# Patient Record
Sex: Male | Born: 1970 | Race: White | Hispanic: No | Smoking: Former smoker
Health system: Southern US, Community
[De-identification: ages and names within clinical notes are randomized; demographics above are authoritative.]

## PROBLEM LIST (undated history)

## (undated) DIAGNOSIS — E8989 Other postprocedural endocrine and metabolic complications and disorders: Secondary | ICD-10-CM

## (undated) DIAGNOSIS — Z0000 Encounter for general adult medical examination without abnormal findings: Secondary | ICD-10-CM

## (undated) DIAGNOSIS — M549 Dorsalgia, unspecified: Secondary | ICD-10-CM

## (undated) DIAGNOSIS — I10 Essential (primary) hypertension: Secondary | ICD-10-CM

## (undated) DIAGNOSIS — F411 Generalized anxiety disorder: Secondary | ICD-10-CM

## (undated) DIAGNOSIS — C439 Malignant melanoma of skin, unspecified: Secondary | ICD-10-CM

## (undated) DIAGNOSIS — B1920 Unspecified viral hepatitis C without hepatic coma: Secondary | ICD-10-CM

## (undated) DIAGNOSIS — G629 Polyneuropathy, unspecified: Secondary | ICD-10-CM

## (undated) DIAGNOSIS — G894 Chronic pain syndrome: Secondary | ICD-10-CM

## (undated) DIAGNOSIS — I890 Lymphedema, not elsewhere classified: Secondary | ICD-10-CM

## (undated) DIAGNOSIS — F988 Other specified behavioral and emotional disorders with onset usually occurring in childhood and adolescence: Principal | ICD-10-CM

## (undated) DIAGNOSIS — F329 Major depressive disorder, single episode, unspecified: Secondary | ICD-10-CM

## (undated) DIAGNOSIS — G4700 Insomnia, unspecified: Secondary | ICD-10-CM

## (undated) DIAGNOSIS — F528 Other sexual dysfunction not due to a substance or known physiological condition: Secondary | ICD-10-CM

## (undated) HISTORY — DX: Chronic pain syndrome: G89.4

## (undated) HISTORY — PX: SHOULDER SURGERY: SHX246

## (undated) HISTORY — PX: LYMPHADENECTOMY: SHX15

## (undated) HISTORY — DX: Essential (primary) hypertension: I10

## (undated) HISTORY — DX: Dorsalgia, unspecified: M54.9

## (undated) HISTORY — DX: Other specified behavioral and emotional disorders with onset usually occurring in childhood and adolescence: F98.8

## (undated) HISTORY — DX: Insomnia, unspecified: G47.00

## (undated) HISTORY — DX: Generalized anxiety disorder: F41.1

## (undated) HISTORY — DX: Lymphedema, not elsewhere classified: I89.0

## (undated) HISTORY — DX: Other postprocedural endocrine and metabolic complications and disorders: E89.89

## (undated) HISTORY — DX: Other sexual dysfunction not due to a substance or known physiological condition: F52.8

## (undated) HISTORY — DX: Major depressive disorder, single episode, unspecified: F32.9

## (undated) HISTORY — DX: Encounter for general adult medical examination without abnormal findings: Z00.00

## (undated) HISTORY — DX: Unspecified viral hepatitis C without hepatic coma: B19.20

---

## 2000-08-31 ENCOUNTER — Emergency Department (HOSPITAL_COMMUNITY): Admission: EM | Admit: 2000-08-31 | Discharge: 2000-08-31 | Payer: Self-pay | Admitting: Emergency Medicine

## 2000-08-31 ENCOUNTER — Encounter: Payer: Self-pay | Admitting: Emergency Medicine

## 2005-09-25 ENCOUNTER — Ambulatory Visit: Payer: Self-pay | Admitting: Internal Medicine

## 2006-02-12 ENCOUNTER — Ambulatory Visit: Payer: Self-pay | Admitting: Internal Medicine

## 2006-02-20 ENCOUNTER — Ambulatory Visit: Payer: Self-pay | Admitting: Internal Medicine

## 2006-08-12 ENCOUNTER — Ambulatory Visit: Payer: Self-pay | Admitting: Internal Medicine

## 2006-10-09 ENCOUNTER — Ambulatory Visit: Payer: Self-pay | Admitting: Internal Medicine

## 2007-04-02 ENCOUNTER — Ambulatory Visit: Payer: Self-pay | Admitting: Internal Medicine

## 2007-11-21 ENCOUNTER — Encounter: Payer: Self-pay | Admitting: Internal Medicine

## 2007-11-21 DIAGNOSIS — I10 Essential (primary) hypertension: Secondary | ICD-10-CM | POA: Insufficient documentation

## 2007-11-21 HISTORY — DX: Essential (primary) hypertension: I10

## 2007-12-10 ENCOUNTER — Ambulatory Visit: Payer: Self-pay | Admitting: Internal Medicine

## 2007-12-10 DIAGNOSIS — F411 Generalized anxiety disorder: Secondary | ICD-10-CM

## 2007-12-10 DIAGNOSIS — L989 Disorder of the skin and subcutaneous tissue, unspecified: Secondary | ICD-10-CM | POA: Insufficient documentation

## 2007-12-10 DIAGNOSIS — F528 Other sexual dysfunction not due to a substance or known physiological condition: Secondary | ICD-10-CM

## 2007-12-10 HISTORY — DX: Generalized anxiety disorder: F41.1

## 2007-12-10 HISTORY — DX: Other sexual dysfunction not due to a substance or known physiological condition: F52.8

## 2008-04-13 ENCOUNTER — Encounter: Payer: Self-pay | Admitting: Internal Medicine

## 2008-04-14 ENCOUNTER — Encounter: Admission: RE | Admit: 2008-04-14 | Discharge: 2008-04-14 | Payer: Self-pay | Admitting: Surgery

## 2008-04-20 ENCOUNTER — Ambulatory Visit (HOSPITAL_COMMUNITY): Admission: RE | Admit: 2008-04-20 | Discharge: 2008-04-20 | Payer: Self-pay | Admitting: Surgery

## 2008-04-30 ENCOUNTER — Ambulatory Visit (HOSPITAL_BASED_OUTPATIENT_CLINIC_OR_DEPARTMENT_OTHER): Admission: RE | Admit: 2008-04-30 | Discharge: 2008-04-30 | Payer: Self-pay | Admitting: Surgery

## 2008-04-30 ENCOUNTER — Encounter (INDEPENDENT_AMBULATORY_CARE_PROVIDER_SITE_OTHER): Payer: Self-pay | Admitting: Surgery

## 2008-05-11 ENCOUNTER — Ambulatory Visit: Payer: Self-pay | Admitting: Oncology

## 2008-06-03 ENCOUNTER — Encounter: Payer: Self-pay | Admitting: Internal Medicine

## 2008-06-03 LAB — CBC WITH DIFFERENTIAL/PLATELET
BASO%: 0.3 % (ref 0.0–2.0)
EOS%: 4.5 % (ref 0.0–7.0)
LYMPH%: 27.9 % (ref 14.0–48.0)
MCH: 31 pg (ref 28.0–33.4)
MCHC: 33.9 g/dL (ref 32.0–35.9)
MONO#: 0.6 10*3/uL (ref 0.1–0.9)
MONO%: 5.9 % (ref 0.0–13.0)
NEUT%: 61.4 % (ref 40.0–75.0)
Platelets: 297 10*3/uL (ref 145–400)
RBC: 4.73 10*6/uL (ref 4.20–5.71)
WBC: 10.5 10*3/uL — ABNORMAL HIGH (ref 4.0–10.0)

## 2008-06-03 LAB — COMPREHENSIVE METABOLIC PANEL
ALT: 18 U/L (ref 0–53)
AST: 18 U/L (ref 0–37)
Alkaline Phosphatase: 73 U/L (ref 39–117)
CO2: 26 mEq/L (ref 19–32)
Creatinine, Ser: 0.9 mg/dL (ref 0.40–1.50)
Sodium: 142 mEq/L (ref 135–145)
Total Bilirubin: 0.5 mg/dL (ref 0.3–1.2)
Total Protein: 6.5 g/dL (ref 6.0–8.3)

## 2008-06-23 ENCOUNTER — Ambulatory Visit: Payer: Self-pay | Admitting: Internal Medicine

## 2008-06-29 ENCOUNTER — Ambulatory Visit: Payer: Self-pay | Admitting: Oncology

## 2008-06-30 ENCOUNTER — Ambulatory Visit (HOSPITAL_COMMUNITY): Admission: RE | Admit: 2008-06-30 | Discharge: 2008-06-30 | Payer: Self-pay | Admitting: Oncology

## 2008-07-05 ENCOUNTER — Ambulatory Visit (HOSPITAL_COMMUNITY): Admission: RE | Admit: 2008-07-05 | Discharge: 2008-07-05 | Payer: Self-pay | Admitting: Oncology

## 2008-07-15 ENCOUNTER — Ambulatory Visit (HOSPITAL_COMMUNITY): Admission: RE | Admit: 2008-07-15 | Discharge: 2008-07-15 | Payer: Self-pay | Admitting: Oncology

## 2008-07-20 ENCOUNTER — Encounter: Payer: Self-pay | Admitting: Internal Medicine

## 2008-07-29 ENCOUNTER — Ambulatory Visit (HOSPITAL_COMMUNITY): Admission: RE | Admit: 2008-07-29 | Discharge: 2008-07-29 | Payer: Self-pay | Admitting: Oncology

## 2008-09-28 ENCOUNTER — Ambulatory Visit: Payer: Self-pay | Admitting: Oncology

## 2008-09-30 LAB — CBC WITH DIFFERENTIAL/PLATELET
Basophils Absolute: 0 10*3/uL (ref 0.0–0.1)
Eosinophils Absolute: 0.1 10*3/uL (ref 0.0–0.5)
HGB: 14.7 g/dL (ref 13.0–17.1)
MONO#: 0.6 10*3/uL (ref 0.1–0.9)
NEUT#: 3.4 10*3/uL (ref 1.5–6.5)
RBC: 4.76 10*6/uL (ref 4.20–5.71)
RDW: 12.2 % (ref 11.2–14.6)
WBC: 6.7 10*3/uL (ref 4.0–10.0)
lymph#: 2.6 10*3/uL (ref 0.9–3.3)

## 2008-09-30 LAB — COMPREHENSIVE METABOLIC PANEL
AST: 16 U/L (ref 0–37)
Albumin: 4.3 g/dL (ref 3.5–5.2)
BUN: 14 mg/dL (ref 6–23)
Calcium: 9.7 mg/dL (ref 8.4–10.5)
Chloride: 107 mEq/L (ref 96–112)
Glucose, Bld: 95 mg/dL (ref 70–99)
Potassium: 4.3 mEq/L (ref 3.5–5.3)
Sodium: 140 mEq/L (ref 135–145)
Total Protein: 6.3 g/dL (ref 6.0–8.3)

## 2008-12-10 ENCOUNTER — Ambulatory Visit (HOSPITAL_COMMUNITY): Admission: RE | Admit: 2008-12-10 | Discharge: 2008-12-10 | Payer: Self-pay | Admitting: Oncology

## 2008-12-14 ENCOUNTER — Telehealth (INDEPENDENT_AMBULATORY_CARE_PROVIDER_SITE_OTHER): Payer: Self-pay | Admitting: *Deleted

## 2008-12-20 ENCOUNTER — Ambulatory Visit: Payer: Self-pay | Admitting: Internal Medicine

## 2008-12-20 DIAGNOSIS — G4700 Insomnia, unspecified: Secondary | ICD-10-CM

## 2008-12-20 HISTORY — DX: Insomnia, unspecified: G47.00

## 2008-12-21 ENCOUNTER — Encounter: Payer: Self-pay | Admitting: Internal Medicine

## 2009-01-13 ENCOUNTER — Encounter: Payer: Self-pay | Admitting: Internal Medicine

## 2009-01-17 ENCOUNTER — Telehealth (INDEPENDENT_AMBULATORY_CARE_PROVIDER_SITE_OTHER): Payer: Self-pay | Admitting: *Deleted

## 2009-02-08 ENCOUNTER — Ambulatory Visit: Payer: Self-pay | Admitting: Oncology

## 2009-02-17 LAB — BASIC METABOLIC PANEL
Calcium: 9.4 mg/dL (ref 8.4–10.5)
Chloride: 104 mEq/L (ref 96–112)
Creatinine, Ser: 0.94 mg/dL (ref 0.40–1.50)

## 2009-02-21 ENCOUNTER — Ambulatory Visit (HOSPITAL_COMMUNITY): Admission: RE | Admit: 2009-02-21 | Discharge: 2009-02-21 | Payer: Self-pay | Admitting: Oncology

## 2009-05-09 ENCOUNTER — Telehealth (INDEPENDENT_AMBULATORY_CARE_PROVIDER_SITE_OTHER): Payer: Self-pay | Admitting: *Deleted

## 2009-06-10 ENCOUNTER — Telehealth (INDEPENDENT_AMBULATORY_CARE_PROVIDER_SITE_OTHER): Payer: Self-pay | Admitting: *Deleted

## 2009-08-03 ENCOUNTER — Ambulatory Visit: Payer: Self-pay | Admitting: Internal Medicine

## 2009-08-03 DIAGNOSIS — M549 Dorsalgia, unspecified: Secondary | ICD-10-CM

## 2009-08-03 HISTORY — DX: Dorsalgia, unspecified: M54.9

## 2009-08-15 ENCOUNTER — Telehealth: Payer: Self-pay | Admitting: Internal Medicine

## 2009-08-16 ENCOUNTER — Ambulatory Visit: Payer: Self-pay | Admitting: Oncology

## 2009-08-17 ENCOUNTER — Ambulatory Visit: Payer: Self-pay | Admitting: Internal Medicine

## 2009-08-21 ENCOUNTER — Emergency Department (HOSPITAL_COMMUNITY): Admission: EM | Admit: 2009-08-21 | Discharge: 2009-08-21 | Payer: Self-pay | Admitting: Emergency Medicine

## 2009-08-22 ENCOUNTER — Encounter: Payer: Self-pay | Admitting: Internal Medicine

## 2009-08-22 LAB — CBC WITH DIFFERENTIAL/PLATELET
BASO%: 0.1 % (ref 0.0–2.0)
HCT: 46.8 % (ref 38.4–49.9)
HGB: 15.8 g/dL (ref 13.0–17.1)
MCHC: 33.8 g/dL (ref 32.0–36.0)
MONO#: 0.6 10*3/uL (ref 0.1–0.9)
NEUT%: 67.2 % (ref 39.0–75.0)
WBC: 9.8 10*3/uL (ref 4.0–10.3)
lymph#: 2.5 10*3/uL (ref 0.9–3.3)

## 2009-08-22 LAB — COMPREHENSIVE METABOLIC PANEL
ALT: 16 U/L (ref 0–53)
Albumin: 4.4 g/dL (ref 3.5–5.2)
CO2: 23 mEq/L (ref 19–32)
Calcium: 9.6 mg/dL (ref 8.4–10.5)
Chloride: 104 mEq/L (ref 96–112)
Creatinine, Ser: 0.83 mg/dL (ref 0.40–1.50)
Potassium: 4.2 mEq/L (ref 3.5–5.3)
Total Protein: 6.8 g/dL (ref 6.0–8.3)

## 2009-08-22 LAB — LACTATE DEHYDROGENASE: LDH: 116 U/L (ref 94–250)

## 2009-11-29 ENCOUNTER — Ambulatory Visit: Payer: Self-pay | Admitting: Internal Medicine

## 2009-12-08 ENCOUNTER — Ambulatory Visit: Payer: Self-pay | Admitting: Oncology

## 2009-12-21 ENCOUNTER — Ambulatory Visit (HOSPITAL_COMMUNITY): Admission: RE | Admit: 2009-12-21 | Discharge: 2009-12-21 | Payer: Self-pay | Admitting: Oncology

## 2009-12-21 LAB — COMPREHENSIVE METABOLIC PANEL
ALT: 33 U/L (ref 0–53)
AST: 35 U/L (ref 0–37)
Alkaline Phosphatase: 75 U/L (ref 39–117)
Creatinine, Ser: 0.82 mg/dL (ref 0.40–1.50)
Total Bilirubin: 0.9 mg/dL (ref 0.3–1.2)

## 2009-12-30 ENCOUNTER — Telehealth: Payer: Self-pay | Admitting: Internal Medicine

## 2010-02-20 ENCOUNTER — Telehealth: Payer: Self-pay | Admitting: Internal Medicine

## 2010-02-20 ENCOUNTER — Ambulatory Visit: Payer: Self-pay | Admitting: Oncology

## 2010-03-03 ENCOUNTER — Ambulatory Visit: Payer: Self-pay | Admitting: Internal Medicine

## 2010-05-16 ENCOUNTER — Telehealth: Payer: Self-pay | Admitting: Internal Medicine

## 2010-08-21 ENCOUNTER — Telehealth: Payer: Self-pay | Admitting: Internal Medicine

## 2010-09-11 ENCOUNTER — Telehealth: Payer: Self-pay | Admitting: Internal Medicine

## 2010-10-11 ENCOUNTER — Ambulatory Visit: Payer: Self-pay | Admitting: Internal Medicine

## 2010-10-11 DIAGNOSIS — H60399 Other infective otitis externa, unspecified ear: Secondary | ICD-10-CM | POA: Insufficient documentation

## 2010-10-11 DIAGNOSIS — J069 Acute upper respiratory infection, unspecified: Secondary | ICD-10-CM | POA: Insufficient documentation

## 2010-11-23 ENCOUNTER — Telehealth: Payer: Self-pay | Admitting: Internal Medicine

## 2011-01-12 ENCOUNTER — Telehealth: Payer: Self-pay | Admitting: Internal Medicine

## 2011-01-14 ENCOUNTER — Encounter (HOSPITAL_COMMUNITY): Payer: Self-pay | Admitting: Oncology

## 2011-01-22 ENCOUNTER — Telehealth: Payer: Self-pay | Admitting: Internal Medicine

## 2011-01-23 NOTE — Assessment & Plan Note (Signed)
Summary: DR Sammuel Cooper PT/NO SLOT  EAR PAIN  STC   Vital Signs:  Patient profile:   40 year old male Height:      73 inches (185.42 cm) Weight:      232.4 pounds (105.64 kg) O2 Sat:      99 % on Room air Temp:     97.3 degrees F (36.28 degrees C) oral Pulse rate:   71 / minute BP sitting:   120 / 78  (left arm) Cuff size:   regular  Vitals Entered By: Orlan Leavens RMA (October 11, 2010 2:48 PM)  O2 Flow:  Room air CC: (R) ear pain & chest congestion, Ear pain Is Patient Diabetic? No Pain Assessment Patient in pain? yes     Location: (R) ear Type: aching   Primary Care Provider:  Corwin Levins MD  CC:  (R) ear pain & chest congestion and Ear pain.  History of Present Illness:  Ear Pain      This is a 40 year old man who presents with Ear pain.  The symptoms began 3 weeks ago.  The severity is described as moderate.  +hx swimmer's ear - used old abx ear drops with some improvement until running out of drops.  The patient also reports ear discharge, but denies hearing loss, tinnitus, fever, sinus pain, nasal discharge, and jaw click.  The pain is located in the right ear.  The pain is described as constant and deep.  The patient denies headache, popping or crackling sounds, pressure, toothache, dizziness, and vertigo.  Prior treatment has included topical antibiotics.    Current Medications (verified): 1)  Avapro 150 Mg  Tabs (Irbesartan) .Marland Kitchen.. 1 By Mouth Once Daily 2)  Alprazolam 1 Mg Tabs (Alprazolam) .Marland Kitchen.. 1 By Mouth Two Times A Day As Needed - To Fill  Aug 22, 2010 3)  Viagra 100 Mg  Tabs (Sildenafil Citrate) .Marland Kitchen.. 1po Once Daily As Needed 4)  Zolpidem Tartrate 10 Mg Tabs (Zolpidem Tartrate) .Marland Kitchen.. 1po At Bedtime As Needed Sleep 5)  Tussionex Pennkinetic Er 8-10 Mg/5ml Lqcr (Chlorpheniramine-Hydrocodone) .Marland Kitchen.. 1 Tsp By Mouth Two Times A Day As Needed Cough 6)  Meloxicam 15 Mg Tabs (Meloxicam) .Marland Kitchen.. 1 By Mouth Once Daily As Needed Pain  Allergies (verified): No Known Drug  Allergies  Past History:  Past Medical History: Hypertension Anxiety melanoma - 5/09 - RLQ abd area - Dr Arline Asp  Social History: Former Smoker Alcohol use-yes Married 1 child works in [employer]  Review of Systems  The patient denies anorexia, fever, weight loss, chest pain, headaches, hemoptysis, and abdominal pain.    Physical Exam  General:  alert, well-developed, well-nourished, and cooperative to examination.   nontoxic Eyes:  vision grossly intact; pupils equal, round and reactive to light.  conjunctiva and lids normal.    Ears:  otits externa R ear - L ear normal Mouth:  teeth and gums in good repair; mucous membranes moist, without lesions or ulcers. oropharynx clear without exudate, no erythema.  Lungs:  normal respiratory effort, no intercostal retractions or use of accessory muscles; normal breath sounds bilaterally - no crackles and no wheezes.    Heart:  normal rate, regular rhythm, no murmur, and no rub. BLE without edema. Psych:  Oriented X3, memory intact for recent and remote, normally interactive, good eye contact, not anxious appearing, not depressed appearing, and not agitated.      Impression & Recommendations:  Problem # 1:  OTITIS EXTERNA (ICD-380.10)  His updated medication list for  this problem includes:    Ciprodex 0.3-0.1 % Susp (Ciprofloxacin-dexamethasone) .Marland KitchenMarland KitchenMarland KitchenMarland Kitchen 4 drops in affected ear two times a day x 7 days  Discussed symptomatic treatment and preventive measures.   Orders: Prescription Created Electronically [phone])  Problem # 2:  URI (ICD-465.9)  cough symptoms but no fever or sputum -  use antitussive as needed as tx for same in past The following medications were removed from the medication list:    Tussionex Pennkinetic Er 8-10 Mg/10ml Lqcr (Chlorpheniramine-hydrocodone) .Marland Kitchen... 1 tsp by mouth two times a day as needed cough His updated medication list for this problem includes:    Meloxicam 15 Mg Tabs (Meloxicam) .Marland Kitchen... 1 by mouth  once daily as needed pain    Tussionex Pennkinetic Er 10-8 Mg/38ml Lqcr (Hydrocod polst-chlorphen polst) .Marland KitchenMarland KitchenMarland KitchenMarland Kitchen 5 cc by mouth every 12hours as needed for cough symptoms  Instructed on symptomatic treatment. Call if symptoms persist or worsen.   Complete Medication List: 1)  Avapro 150 Mg Tabs (Irbesartan) .Marland Kitchen.. 1 by mouth once daily 2)  Alprazolam 1 Mg Tabs (Alprazolam) .Marland Kitchen.. 1 by mouth two times a day as needed - to fill  Aug 22, 2010 3)  Viagra 100 Mg Tabs (Sildenafil citrate) .Marland Kitchen.. 1po once daily as needed 4)  Zolpidem Tartrate 10 Mg Tabs (Zolpidem tartrate) .Marland Kitchen.. 1po at bedtime as needed sleep 5)  Meloxicam 15 Mg Tabs (Meloxicam) .Marland Kitchen.. 1 by mouth once daily as needed pain 6)  Ciprodex 0.3-0.1 % Susp (Ciprofloxacin-dexamethasone) .... 4 drops in affected ear two times a day x 7 days 7)  Tussionex Pennkinetic Er 10-8 Mg/54ml Lqcr (Hydrocod polst-chlorphen polst) .... 5 cc by mouth every 12hours as needed for cough symptoms  Patient Instructions: 1)  it was good to see you today. 2)  Ciprodex ear drops for ear infection - 3)  tussionex for cough 4)  if you develop worsening symptoms or fever, call us and we can reconsider oral antibiotics for cough but it does not appear necessary to use any anitbiotic at this time  5)  Please schedule a follow-up appointment as needed. Prescriptions: TUSSIONEX PENNKINETIC ER 10-8 MG/5ML LQCR (HYDROCOD POLST-CHLORPHEN POLST) 5 cc by mouth every 12hours as needed for cough symptoms  #6 oz x 0   Entered and Authorized by:   Newt Lukes MD   Signed by:   Newt Lukes MD on 10/11/2010   Method used:   Print then Give to Patient   RxID:   [id number] CIPRODEX 0.3-0.1 % SUSP (CIPROFLOXACIN-DEXAMETHASONE) 4 drops in affected ear two times a day x 7 days  #1 x 0   Entered and Authorized by:   Newt Lukes MD   Signed by:   Newt Lukes MD on 10/11/2010   Method used:   Electronically to        [facility] (retail)       [address]       [place], [place]  [id number]       Ph: [phone] or [phone]       Fax: [phone]   RxID:   [id number]    Orders Added: 1)  Est. Patient Level IV [22025] 2)  Prescription Created Electronically [phone] [phone]  Appended Document: DR Sammuel Cooper PT/NO SLOT  EAR PAIN  [facility] Pharmacy called to request verbal Rx for pt as he was already at the pharmacy. Verbal given to Onalee Hua at pharmacy.

## 2011-01-23 NOTE — Progress Notes (Signed)
Summary: Call Report  Phone Note Other Incoming   Caller: [facility] Summary of Call: [facility] Triage Call Report Triage Record Num: [id number] Operator: Alphonsa Overall Patient Name: Evan [name] Call Date & Time: 09/09/2010 3:14:14PM Patient Phone: [phone] PCP: Oliver Barre Patient Gender: Male PCP Fax : [phone] Patient DOB: 1971/09/02 Practice Name: [facility] Reason for Call: Iona Hansen calling about allergic rxn. Onset 9/17/1 at 0500. Came in contact with fish. Hives,trouble breathing, eye swelling. All s/s resolved, eye swelling and throat soreness with difficulty swallowing still present at 1517. Benadryl q 4 hours last at 1130. Pt with known fish allergy previous. Pt aware needs to be evaluated now, 911 recommended. Pt declined, urgency of ED or UC discussed for epi pen order. Protocol(s) Used: Allergic Reaction, Severe Recommended Outcome per Protocol: Activate EMS 911 Reason for Outcome: Signs/symptoms of anaphylaxis Care Advice:  ~ IMMEDIATE ACTION If previously prescribed for these symptoms by provider for this person, administer dose of epinephrine (e.g. EpiPen) as directed.  ~ 09/ Initial call taken by: Margaret Pyle, CMA,  September 11, 2010 8:10 AM  Follow-up for Phone Call        noted Follow-up by: Corwin Levins MD,  September 11, 2010 8:32 AM

## 2011-01-23 NOTE — Progress Notes (Signed)
Summary: medication refill  Phone Note Refill Request Message from:  Fax from Pharmacy on August 21, 2010 11:53 AM  Refills Requested: Medication #1:  ALPRAZOLAM 1 MG TABS 1 by mouth two times a day as needed - to fill  june 1   Dosage confirmed as above?Dosage Confirmed   Last Refilled: 05/16/2010   Notes: [facility] [phone] Initial call taken by: Zella Ball Ewing CMA Duncan Dull),  August 21, 2010 11:54 AM    New/Updated Medications: ALPRAZOLAM 1 MG TABS (ALPRAZOLAM) 1 by mouth two times a day as needed - to fill  Aug 22, 2010 Prescriptions: ALPRAZOLAM 1 MG TABS (ALPRAZOLAM) 1 by mouth two times a day as needed - to fill  Aug 22, 2010  #60 x 3   Entered and Authorized by:   Corwin Levins MD   Signed by:   Corwin Levins MD on 08/21/2010   Method used:   Print then Give to Patient   RxID:   [id number]  done hardcopy to LIM side B - dahlia  Corwin Levins MD  August 21, 2010 1:30 PM   Appended Document: medication refill faxed hardcopy to pharmacy

## 2011-01-23 NOTE — Progress Notes (Signed)
Summary: Medication Refill  Phone Note Refill Request  on May 16, 2010 1:45 PM  Refills Requested: Medication #1:  ZOLPIDEM TARTRATE 10 MG TABS 1po at bedtime as needed sleep   Dosage confirmed as above?Dosage Confirmed   Notes: [facility] [phone] Initial call taken by: Scharlene Gloss,  May 16, 2010 1:45 PM  Follow-up for Phone Call        Rx faxed to pharmacy Follow-up by: Margaret Pyle, CMA,  May 16, 2010 2:43 PM    New/Updated Medications: ZOLPIDEM TARTRATE 10 MG TABS (ZOLPIDEM TARTRATE) 1po at bedtime as needed sleep Prescriptions: ZOLPIDEM TARTRATE 10 MG TABS (ZOLPIDEM TARTRATE) 1po at bedtime as needed sleep  #30 x 5   Entered and Authorized by:   Corwin Levins MD   Signed by:   Corwin Levins MD on 05/16/2010   Method used:   Print then Give to Patient   RxID:   [id number]  done hardcopy to LIM side B - dahlia  Corwin Levins MD  May 16, 2010 2:14 PM

## 2011-01-23 NOTE — Progress Notes (Signed)
Summary: Viagra  Phone Note From Pharmacy   Caller: [facility] Summary of Call: Evan [name] called stating that they rec'd denial for patient Viagra b/c it was filled on 1/4. They did not receive anything on 1/4 so I notified them it was rf x5. Initial call taken by: Lucious Groves,  December 30, 2009 4:03 PM

## 2011-01-23 NOTE — Progress Notes (Signed)
Summary: Med refill  Phone Note Refill Request  on May 16, 2010 1:46 PM  Refills Requested: Medication #1:  ALPRAZOLAM 1 MG TABS 1 by mouth two times a day as needed - to fill mar 3   Dosage confirmed as above?Dosage Confirmed   Notes: [facility] [phone] Initial call taken by: Scharlene Gloss,  May 16, 2010 1:46 PM  Follow-up for Phone Call        Rx faxed to pharmacy Follow-up by: Margaret Pyle, CMA,  May 16, 2010 2:43 PM    New/Updated Medications: ALPRAZOLAM 1 MG TABS (ALPRAZOLAM) 1 by mouth two times a day as needed - to fill  May 24, 2010 Prescriptions: ALPRAZOLAM 1 MG TABS (ALPRAZOLAM) 1 by mouth two times a day as needed - to fill  May 24, 2010  #60 x 2   Entered and Authorized by:   Corwin Levins MD   Signed by:   Corwin Levins MD on 05/16/2010   Method used:   Print then Give to Patient   RxID:   [id number]  done hardcopy to LIM side B - dahlia  Corwin Levins MD  May 16, 2010 2:15 PM

## 2011-01-23 NOTE — Progress Notes (Signed)
Summary: medication refill  Phone Note Refill Request Message from:  Fax from Pharmacy on November 23, 2010 3:32 PM  Refills Requested: Medication #1:  ZOLPIDEM TARTRATE 10 MG TABS 1po at bedtime as needed sleep   Dosage confirmed as above?Dosage Confirmed   Last Refilled: 05/16/2010   Notes: [facility] [phone] Initial call taken by: Zella Ball Ewing CMA Duncan Dull),  November 23, 2010 3:33 PM  Follow-up for Phone Call        done hardcopy to LIM side B - dahlia  Follow-up by: Corwin Levins MD,  November 23, 2010 3:34 PM  Additional Follow-up for Phone Call Additional follow up Details #1::        Rx faxed to pharmacy Additional Follow-up by: Margaret Pyle, CMA,  November 23, 2010 3:39 PM    New/Updated Medications: ZOLPIDEM TARTRATE 10 MG TABS (ZOLPIDEM TARTRATE) 1po at bedtime as needed sleep Prescriptions: ZOLPIDEM TARTRATE 10 MG TABS (ZOLPIDEM TARTRATE) 1po at bedtime as needed sleep  #30 x 5   Entered and Authorized by:   Corwin Levins MD   Signed by:   Corwin Levins MD on 11/23/2010   Method used:   Print then Give to Patient   RxID:   [id number]

## 2011-01-23 NOTE — Progress Notes (Signed)
Summary: Xanax  Phone Note Call from Patient Call back at Home Phone [phone]   Caller: Patient Summary of Call: pt called requesting refills of Xanax, pt says he will leaving town to [place] tomorrow. Initial call taken by: Margaret Pyle, CMA,  February 20, 2010 2:36 PM  Follow-up for Phone Call        done, but not due to refill until mar 3- done hardcopy to LIM side B - dahlia  Follow-up by: Corwin Levins MD,  February 20, 2010 5:35 PM  Additional Follow-up for Phone Call Additional follow up Details #1::        pt informed via VM, rx in cabinet for pick up Additional Follow-up by: Margaret Pyle, CMA,  February 21, 2010 8:21 AM    New/Updated Medications: ALPRAZOLAM 1 MG TABS (ALPRAZOLAM) 1 by mouth two times a day as needed - to fill Feb 23, 2010 Prescriptions: ALPRAZOLAM 1 MG TABS (ALPRAZOLAM) 1 by mouth two times a day as needed - to fill Feb 23, 2010  #60 x 2   Entered and Authorized by:   Corwin Levins MD   Signed by:   Corwin Levins MD on 02/20/2010   Method used:   Print then Give to Patient   RxID:   [id number]   Appended Document: Xanax Pharmacy called [facility]) from [facility] requesting above RX be approved to fill today as pt is going out of town. RX approved per MD.

## 2011-01-23 NOTE — Assessment & Plan Note (Signed)
Summary: cough sore throat no fever--stc   Vital Signs:  Patient profile:   40 year old male Height:      73 inches Weight:      230.25 pounds BMI:     30.49 O2 Sat:      98 % on Room air Temp:     98.0 degrees F oral Pulse rate:   89 / minute BP sitting:   140 / 88  (left arm)  Vitals Entered By: Lucious Groves (March 03, 2010 4:05 PM)  O2 Flow:  Room air CC: Pt c.o feeling spacey, cough, and sore throat x2 days. Pt denies any fever./kb Is Patient Diabetic? No Pain Assessment Patient in pain? no      Comments Per patient he did try mucinex otc, which produced mucous, but did not help him feel better./kb   CC:  Pt c.o feeling spacey, cough, and and sore throat x2 days. Pt denies any fever./kb.  History of Present Illness: grandmother recenly hospd, better infectious symptoms after tx  last visit, now worse again last 2 days acute mild to mod with c/o fever, headache, malaise, general weakness, ST, prod cough greenish sputum but Pt denies CP, sob, doe, wheezing, orthopnea, pnd, worsening LE edema, palps, dizziness or syncope   Has been taking mucinex but not working well.  Also mentions the avapro now quite expensive with his insurance but not sure which are preferred alternatives.  Also menitons right lower back pain midl to mod for 3 days , worse to stand up from chair and worse with recent massage by a friend trained in [place] massage by a monk when she lived there for 6 yrs; no bowel or bladder change or LE pain, numb, weakness.    Problems Prior to Update: 1)  Back Pain  (ICD-724.5) 2)  Bronchitis-acute  (ICD-466.0) 3)  Back Pain  (ICD-724.5) 4)  Insomnia-sleep Disorder-unspec  (ICD-780.52) 5)  Anxiety  (ICD-300.00) 6)  Skin Lesion  (ICD-709.9) 7)  Erectile Dysfunction  (ICD-302.72) 8)  Hypertension  (ICD-401.9)  Medications Prior to Update: 1)  Avapro 150 Mg  Tabs (Irbesartan) .Marland Kitchen.. 1 By Mouth Once Daily 2)  Alprazolam 1 Mg Tabs (Alprazolam) .Marland Kitchen.. 1 By Mouth Two Times A  Day As Needed - To Fill Feb 23, 2010 3)  Viagra 100 Mg  Tabs (Sildenafil Citrate) .Marland Kitchen.. 1po Once Daily As Needed 4)  Zolpidem Tartrate 10 Mg Tabs (Zolpidem Tartrate) .Marland Kitchen.. 1po At Bedtime As Needed Sleep  Current Medications (verified): 1)  Avapro 150 Mg  Tabs (Irbesartan) .Marland Kitchen.. 1 By Mouth Once Daily 2)  Alprazolam 1 Mg Tabs (Alprazolam) .Marland Kitchen.. 1 By Mouth Two Times A Day As Needed - To Fill Feb 23, 2010 3)  Viagra 100 Mg  Tabs (Sildenafil Citrate) .Marland Kitchen.. 1po Once Daily As Needed 4)  Zolpidem Tartrate 10 Mg Tabs (Zolpidem Tartrate) .Marland Kitchen.. 1po At Bedtime As Needed Sleep 5)  Cephalexin 500 Mg Caps (Cephalexin) .Marland Kitchen.. 1 By Mouth Three Times A Day 6)  Tussionex Pennkinetic Er 8-10 Mg/33ml Lqcr (Chlorpheniramine-Hydrocodone) .Marland Kitchen.. 1 Tsp By Mouth Two Times A Day As Needed Cough 7)  Meloxicam 15 Mg Tabs (Meloxicam) .Marland Kitchen.. 1 By Mouth Once Daily As Needed Pain  Allergies (verified): No Known Drug Allergies  Past History:  Past Medical History: Last updated: 12/20/2008 Hypertension Anxiety melanoma - 5/09 - RLQ abd area - Dr Arline Asp  Past Surgical History: Last updated: 12/10/2007 Denies surgical history  Social History: Last updated: 12/20/2008 Former Smoker Alcohol use-yes Married 1 child  Risk Factors: Smoking Status: quit (12/10/2007)  Review of Systems       all otherwise negative per pt -    Physical Exam  General:  alert and overweight-appearing.   Head:  normocephalic and atraumatic.   Eyes:  vision grossly intact, pupils equal, and pupils round.   Ears:  bilat tms' red, sinus nontender Nose:  nasal dischargemucosal pallor and mucosal edema.   Mouth:  pharyngeal erythema and fair dentition.   Neck:  supple and cervical lymphadenopathy.   Lungs:  normal respiratory effort and normal breath sounds.   Heart:  normal rate and regular rhythm.   Extremities:  no edema, no erythema    Impression & Recommendations:  Problem # 1:  BRONCHITIS-ACUTE (ICD-466.0)  with marked cough - treat  as above, f/u any worsening signs or symptoms   His updated medication list for this problem includes:    Cephalexin 500 Mg Caps (Cephalexin) .Marland Kitchen... 1 by mouth three times a day    Tussionex Pennkinetic Er 8-10 Mg/68ml Lqcr (Chlorpheniramine-hydrocodone) .Marland Kitchen... 1 tsp by mouth two times a day as needed cough  Problem # 2:  BACK PAIN (ICD-724.5)  with myalgias to LE after aggressive massage - for meloxicam as needed   His updated medication list for this problem includes:    Meloxicam 15 Mg Tabs (Meloxicam) .Marland Kitchen... 1 by mouth once daily as needed pain  Problem # 3:  HYPERTENSION (ICD-401.9)  His updated medication list for this problem includes:    Avapro 150 Mg Tabs (Irbesartan) .Marland Kitchen... 1 by mouth once daily treat as above, f/u any worsening signs or symptoms , stable overall by hx and exam, ok to continue meds/tx as is  , but will try to find out which med is preferred to which he can be changed  BP today: 140/88 Prior BP: 140/92 (11/29/2009)  Complete Medication List: 1)  Avapro 150 Mg Tabs (Irbesartan) .Marland Kitchen.. 1 by mouth once daily 2)  Alprazolam 1 Mg Tabs (Alprazolam) .Marland Kitchen.. 1 by mouth two times a day as needed - to fill Feb 23, 2010 3)  Viagra 100 Mg Tabs (Sildenafil citrate) .Marland Kitchen.. 1po once daily as needed 4)  Zolpidem Tartrate 10 Mg Tabs (Zolpidem tartrate) .Marland Kitchen.. 1po at bedtime as needed sleep 5)  Cephalexin 500 Mg Caps (Cephalexin) .Marland Kitchen.. 1 by mouth three times a day 6)  Tussionex Pennkinetic Er 8-10 Mg/77ml Lqcr (Chlorpheniramine-hydrocodone) .Marland Kitchen.. 1 tsp by mouth two times a day as needed cough 7)  Meloxicam 15 Mg Tabs (Meloxicam) .Marland Kitchen.. 1 by mouth once daily as needed pain  Patient Instructions: 1)  Please take all new medications as prescribed 2)  Continue all previous medications as before this visit  3)  Please check with your pharmacy to see if Diovan, Micardis, or Benicar would be less expensive than the avapro 4)  Please schedule a follow-up appointment as  needed. Prescriptions: MELOXICAM 15 MG TABS (MELOXICAM) 1 by mouth once daily as needed pain  #30 x 2   Entered and Authorized by:   Corwin Levins MD   Signed by:   Corwin Levins MD on 03/03/2010   Method used:   Print then Give to Patient   RxID:   [id number] TUSSIONEX PENNKINETIC ER 8-10 MG/5ML LQCR (CHLORPHENIRAMINE-HYDROCODONE) 1 tsp by mouth two times a day as needed cough  #6 oz x 1   Entered and Authorized by:   Corwin Levins MD   Signed by:   Corwin Levins MD on 03/03/2010  Method used:   Print then Give to Patient   RxID:   [id number] CEPHALEXIN 500 MG CAPS (CEPHALEXIN) 1 by mouth three times a day  #30 x 0   Entered and Authorized by:   Corwin Levins MD   Signed by:   Corwin Levins MD on 03/03/2010   Method used:   Print then Give to Patient   RxID:   [id number]

## 2011-01-25 NOTE — Progress Notes (Signed)
Summary: alprazolam  Phone Note Refill Request Message from:  Fax from Pharmacy on January 12, 2011 9:25 AM  Refills Requested: Medication #1:  ALPRAZOLAM 1 MG TABS 1 by mouth two times a day as needed - to fill  aug 30   Last Refilled: 12/07/2010 [facility] Last ov 09/2010 Is this ok to refill for Dr. Jonny Ruiz since md is out of office  Next Appointment Scheduled: none  Initial call taken by: Orlan Leavens RMA,  January 12, 2011 9:26 AM  Follow-up for Phone Call        ok to fill 1 mo supply for JWJ pt - no refills - signed rx on lucy's desk - thanks Follow-up by: Newt Lukes MD,  January 12, 2011 12:17 PM  Additional Follow-up for Phone Call Additional follow up Details #1::        Faxed script to [phone] Additional Follow-up by: Orlan Leavens RMA,  January 12, 2011 1:20 PM    New/Updated Medications: ALPRAZOLAM 1 MG TABS (ALPRAZOLAM) 1 by mouth two times a day as needed Prescriptions: ALPRAZOLAM 1 MG TABS (ALPRAZOLAM) 1 by mouth two times a day as needed  #60 x 0   Entered and Authorized by:   Newt Lukes MD   Signed by:   Newt Lukes MD on 01/12/2011   Method used:   Printed then faxed to ...       [facility] (retail)       [address]       [place], [place]  [id number]       Ph: [phone] or [phone]       Fax: [phone]   RxID:   [id number]

## 2011-01-31 NOTE — Progress Notes (Signed)
  Phone Note Refill Request Message from:  Fax from Pharmacy on January 22, 2011 11:46 AM  Refills Requested: Medication #1:  VIAGRA 100 MG  TABS 1po once daily as needed   Dosage confirmed as above?Dosage Confirmed   Last Refilled: 09/07/2010   Notes: [facility] [phone] Initial call taken by: Zella Ball Ewing CMA Duncan Dull),  January 22, 2011 11:47 AM    Prescriptions: VIAGRA 100 MG  TABS (SILDENAFIL CITRATE) 1po once daily as needed  #6 Tablet x 1   Entered by:   Scharlene Gloss CMA (AAMA)   Authorized by:   Corwin Levins MD   Signed by:   Scharlene Gloss CMA (AAMA) on 01/22/2011   Method used:   Faxed to ...       [facility] (retail)       [address]       [place], [place]  [address]       Ph: [phone] or [phone]       Fax: [phone]   RxID:   [id number]

## 2011-02-01 ENCOUNTER — Telehealth: Payer: Self-pay | Admitting: Internal Medicine

## 2011-02-08 NOTE — Progress Notes (Signed)
       New/Updated Medications: DIOVAN 160 MG TABS (VALSARTAN) 1 by mouth once daily Prescriptions: DIOVAN 160 MG TABS (VALSARTAN) 1 by mouth once daily  #30 x 9   Entered by:   Zella Ball Ewing CMA (AAMA)   Authorized by:   Corwin Levins MD   Signed by:   Scharlene Gloss CMA (AAMA) on 02/01/2011   Method used:   Faxed to ...       [facility] (retail)       [address]       [place], [place]  [id number]       Ph: [phone] or [phone]       Fax: [phone]   RxID:   [id number]

## 2011-02-14 ENCOUNTER — Telehealth: Payer: Self-pay | Admitting: Internal Medicine

## 2011-02-20 NOTE — Progress Notes (Signed)
Summary: med refill  Phone Note Refill Request Message from:  Fax from Pharmacy on February 14, 2011 9:17 AM  Refills Requested: Medication #1:  ALPRAZOLAM 1 MG TABS 1 by mouth two times a day as needed   Dosage confirmed as above?Dosage Confirmed   Last Refilled: 01/12/2011   Notes: Leonie Douglas [phone] Initial call taken by: Zella Ball Ewing CMA Duncan Dull),  February 14, 2011 9:18 AM    Prescriptions: ALPRAZOLAM 1 MG TABS (ALPRAZOLAM) 1 by mouth two times a day as needed  #60 x 2   Entered and Authorized by:   Corwin Levins MD   Signed by:   Corwin Levins MD on 02/14/2011   Method used:   Print then Give to Patient   RxID:   [id number]  done hardcopy to [facility] side B - dahlia Corwin Levins MD  February 14, 2011 9:52 AM   Rx faxed to pharmacy Margaret Pyle, CMA  February 14, 2011 9:58 AM

## 2011-03-28 ENCOUNTER — Other Ambulatory Visit: Payer: Self-pay | Admitting: Internal Medicine

## 2011-05-08 NOTE — Op Note (Signed)
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   PROCEDURES:  1. Wide excision of right lower quadrant abdominal skin melanoma.  2. Right axillary sentinel lymph node mapping with injection of      methylene blue dye.  3. Two intermediate closure of right lower quadrant abdominal skin      wound.   SURGEON:  Evan [name], M.D.   ANESTHESIA:  LMA with 0.25% Sensorcaine local.   ESTIMATED BLOOD LOSS:  20 mL.   SPECIMENS:  1. Right lower quadrant skin.  2. Right axillary sentinel lymph node x1 to pathology.   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  He presents for wide excision and  sentinel lymph node mapping given the intermediate thickness of 1.25 mm.  He underwent lymphoscintigraphy preoperatively, which showed drainage to  his right axilla.   DESCRIPTION OF PROCEDURE:  The patient brought to the operating room  after technetium sulfur colloid injection around the wound in the right  lower quadrant.  After induction of general anesthesia, the right lower  quadrant, abdominal wall, and right axilla were prepped and draped in  sterile fashion.  NeoProbe was used.  Counts were noted in the right  axilla.  There were no counts in the right groin or left groin regions.  I measured roughly 1 cm from the edge of the previous biopsy site  circumferentially around the lesion and marked this with a marking  pen.  NeoProbe was used.  I infiltrated the right axilla with 0.25%  Sensorcaine.  NeoProbe was used to mark the spot where the hot node was.  We injected 4 mL of methylene blue dye mixed with saline in a  intradermal and subcutaneous region around the previous excision site.  Using the NeoProbe, we discovered a hot blue sentinel node.  It was the  only one that was active.  This was sent to pathology for evaluation.  No obvious signs of any melanoma.  The node was somewhat large.  We  inspected the cavity and found to be hemostatic.   Next, we did a wide excision to the right lower quadrant melanoma site.  A 1-cm margin was used.  With wide excision, we took this all the way  down to the fascia through the fat.  We then mobilized skin flaps and  closed this in 2 layers using a deep layer of 2-0 Vicryl and layer above  that of 3-0 Vicryl.  A 4-0 Monocryl was used to close skin in  subcuticular fashion.  The right axilla  was closed in a similar fashion  using 2 layers, initially with 3-0 Vicryl  subcutaneous stitch and 4-0 Monocryl subcuticular stitch.  Dermabond was  applied to both incisions.  All final counts of sponge, needle, and  instruments were found to be correct at this portion of the case.  The  specimen was sent to pathology.  The patient was then extubated, taken  to the recovery in satisfactory condition.  Evan [name], M.D.  Electronically Signed     TAC/MEDQ  D:  04/30/2008  T:  05/01/2008  Job:  [id number]   cc:   Evan [name], M.D.  Evan Quint. Plotnikov, MD

## 2011-05-14 ENCOUNTER — Other Ambulatory Visit: Payer: Self-pay

## 2011-05-14 ENCOUNTER — Encounter: Payer: Self-pay | Admitting: Internal Medicine

## 2011-05-14 DIAGNOSIS — Z0000 Encounter for general adult medical examination without abnormal findings: Secondary | ICD-10-CM | POA: Insufficient documentation

## 2011-05-14 HISTORY — DX: Encounter for general adult medical examination without abnormal findings: Z00.00

## 2011-05-14 MED ORDER — ALPRAZOLAM 1 MG PO TABS: 1.0000 mg | ORAL_TABLET | Freq: Two times a day (BID) | ORAL | Status: DC

## 2011-05-15 ENCOUNTER — Ambulatory Visit: Payer: Self-pay | Admitting: Internal Medicine

## 2011-05-30 ENCOUNTER — Ambulatory Visit: Payer: Self-pay | Admitting: Internal Medicine

## 2011-06-08 ENCOUNTER — Other Ambulatory Visit: Payer: Self-pay

## 2011-06-08 MED ORDER — ZOLPIDEM TARTRATE 10 MG PO TABS: 10.0000 mg | ORAL_TABLET | Freq: Every evening | ORAL | Status: DC | PRN

## 2011-06-08 NOTE — Telephone Encounter (Signed)
Faxed hardcopy to pharmacy. 

## 2011-08-07 ENCOUNTER — Other Ambulatory Visit: Payer: Self-pay | Admitting: Internal Medicine

## 2011-08-17 ENCOUNTER — Other Ambulatory Visit (HOSPITAL_COMMUNITY): Payer: Self-pay | Admitting: Oncology

## 2011-08-17 ENCOUNTER — Encounter (HOSPITAL_BASED_OUTPATIENT_CLINIC_OR_DEPARTMENT_OTHER): Payer: BC Managed Care – PPO | Admitting: Oncology

## 2011-08-17 DIAGNOSIS — C4359 Malignant melanoma of other part of trunk: Secondary | ICD-10-CM

## 2011-08-17 DIAGNOSIS — C439 Malignant melanoma of skin, unspecified: Secondary | ICD-10-CM

## 2011-08-17 LAB — CBC WITH DIFFERENTIAL/PLATELET
BASO%: 0.4 % (ref 0.0–2.0)
EOS%: 5.7 % (ref 0.0–7.0)
HCT: 40.3 % (ref 38.4–49.9)
LYMPH%: 30.4 % (ref 14.0–49.0)
MCH: 31 pg (ref 27.2–33.4)
MCHC: 34.1 g/dL (ref 32.0–36.0)
NEUT%: 55.1 % (ref 39.0–75.0)
Platelets: 277 10*3/uL (ref 140–400)
lymph#: 2.5 10*3/uL (ref 0.9–3.3)

## 2011-08-17 LAB — COMPREHENSIVE METABOLIC PANEL
CO2: 24 mEq/L (ref 19–32)
Creatinine, Ser: 0.89 mg/dL (ref 0.50–1.35)
Glucose, Bld: 91 mg/dL (ref 70–99)
Total Bilirubin: 0.2 mg/dL — ABNORMAL LOW (ref 0.3–1.2)
Total Protein: 6.4 g/dL (ref 6.0–8.3)

## 2011-08-17 LAB — LACTATE DEHYDROGENASE: LDH: 178 U/L (ref 94–250)

## 2011-08-23 ENCOUNTER — Ambulatory Visit (HOSPITAL_COMMUNITY)
Admission: RE | Admit: 2011-08-23 | Discharge: 2011-08-23 | Disposition: A | Discharge status: Home / Self Care | Payer: BC Managed Care – PPO | Source: Ambulatory Visit | Attending: Oncology | Admitting: Oncology

## 2011-08-23 ENCOUNTER — Encounter (HOSPITAL_COMMUNITY): Payer: Self-pay

## 2011-08-23 ENCOUNTER — Other Ambulatory Visit (HOSPITAL_COMMUNITY): Payer: Self-pay | Admitting: Oncology

## 2011-08-23 DIAGNOSIS — C439 Malignant melanoma of skin, unspecified: Secondary | ICD-10-CM

## 2011-08-23 DIAGNOSIS — Z79899 Other long term (current) drug therapy: Secondary | ICD-10-CM | POA: Insufficient documentation

## 2011-08-23 DIAGNOSIS — R599 Enlarged lymph nodes, unspecified: Secondary | ICD-10-CM | POA: Insufficient documentation

## 2011-08-23 DIAGNOSIS — C494 Malignant neoplasm of connective and soft tissue of abdomen: Secondary | ICD-10-CM | POA: Insufficient documentation

## 2011-08-23 HISTORY — DX: Malignant melanoma of skin, unspecified: C43.9

## 2011-08-23 MED ORDER — FLUDEOXYGLUCOSE F - 18 (FDG) INJECTION
17.6000 | Freq: Once | INTRAVENOUS | Status: AC | PRN
  Administered 2011-08-23: 17.6 via INTRAVENOUS

## 2011-08-28 ENCOUNTER — Other Ambulatory Visit (HOSPITAL_COMMUNITY): Payer: Self-pay | Admitting: Oncology

## 2011-08-28 DIAGNOSIS — C799 Secondary malignant neoplasm of unspecified site: Secondary | ICD-10-CM

## 2011-08-28 DIAGNOSIS — C439 Malignant melanoma of skin, unspecified: Secondary | ICD-10-CM

## 2011-08-31 ENCOUNTER — Other Ambulatory Visit (HOSPITAL_COMMUNITY): Payer: Self-pay | Admitting: Oncology

## 2011-08-31 ENCOUNTER — Ambulatory Visit (HOSPITAL_COMMUNITY)
Admission: RE | Admit: 2011-08-31 | Discharge: 2011-08-31 | Disposition: A | Discharge status: Home / Self Care | Payer: BC Managed Care – PPO | Source: Ambulatory Visit | Attending: Oncology | Admitting: Oncology

## 2011-08-31 ENCOUNTER — Ambulatory Visit (HOSPITAL_COMMUNITY): Payer: BC Managed Care – PPO

## 2011-08-31 ENCOUNTER — Other Ambulatory Visit: Payer: Self-pay | Admitting: Interventional Radiology

## 2011-08-31 DIAGNOSIS — C773 Secondary and unspecified malignant neoplasm of axilla and upper limb lymph nodes: Secondary | ICD-10-CM | POA: Insufficient documentation

## 2011-08-31 DIAGNOSIS — C799 Secondary malignant neoplasm of unspecified site: Secondary | ICD-10-CM

## 2011-08-31 DIAGNOSIS — R599 Enlarged lymph nodes, unspecified: Secondary | ICD-10-CM | POA: Insufficient documentation

## 2011-08-31 DIAGNOSIS — C439 Malignant melanoma of skin, unspecified: Secondary | ICD-10-CM

## 2011-08-31 LAB — CBC
Hemoglobin: 13.5 g/dL (ref 13.0–17.0)
MCH: 30.4 pg (ref 26.0–34.0)
MCV: 93.2 fL (ref 78.0–100.0)
Platelets: 329 10*3/uL (ref 150–400)
RBC: 4.44 MIL/uL (ref 4.22–5.81)

## 2011-09-06 ENCOUNTER — Encounter (HOSPITAL_BASED_OUTPATIENT_CLINIC_OR_DEPARTMENT_OTHER): Payer: BC Managed Care – PPO | Admitting: Oncology

## 2011-09-06 DIAGNOSIS — C4359 Malignant melanoma of other part of trunk: Secondary | ICD-10-CM

## 2011-09-10 ENCOUNTER — Telehealth (INDEPENDENT_AMBULATORY_CARE_PROVIDER_SITE_OTHER): Payer: Self-pay | Admitting: General Surgery

## 2011-09-10 NOTE — Telephone Encounter (Signed)
Pts wife returned call to schedule a sooner appt.

## 2011-09-11 ENCOUNTER — Encounter (INDEPENDENT_AMBULATORY_CARE_PROVIDER_SITE_OTHER): Payer: Self-pay | Admitting: Surgery

## 2011-09-11 ENCOUNTER — Ambulatory Visit (INDEPENDENT_AMBULATORY_CARE_PROVIDER_SITE_OTHER): Payer: BC Managed Care – PPO | Admitting: Surgery

## 2011-09-11 VITALS — BP 130/90 | HR 72 | Temp 98.4°F | Resp 12 | Ht 75.0 in | Wt 220.0 lb

## 2011-09-11 DIAGNOSIS — C4359 Malignant melanoma of other part of trunk: Secondary | ICD-10-CM

## 2011-09-11 NOTE — Patient Instructions (Signed)
Will refer to [facility] for opinion about melanoma.

## 2011-09-11 NOTE — Progress Notes (Signed)
No chief complaint on file.   HPI Evan [name] is a 40 y.o. male.  HPI Patient presents today at the request of Dr. Arline Asp do to a mass in his right axilla. He has a history of melanoma on his right abdominal wall. This was a T2 B. N0 MX superficial spreading melanoma status post wide excision in 2009% a lymph node mapping. There were 3 sentinel nodes from the right axilla and these were negative.  This was a Clark level IV melanoma superficial spreading Breslow thickness of 1.25 mm.  the patient has done well since 2009 and has been followed by Dr. Arline Asp.  The patient noticed a mass in his right axilla. Core biopsy was done which shows melanoma. PET scan showed uptake in the right axilla without any other areas showing uptake.  Past Medical History  Diagnosis Date  . ANXIETY 12/10/2007  . BACK PAIN 08/03/2009  . ERECTILE DYSFUNCTION 12/10/2007  . HYPERTENSION 11/21/2007  . INSOMNIA-SLEEP DISORDER-UNSPEC 12/20/2008  . Preventative health care 05/14/2011  . Melanoma     No past surgical history on file.  Family History  Problem Relation Age of Onset  . Cancer Other     pancreatic    Social History History  Substance Use Topics  . Smoking status: Former Games developer  . Smokeless tobacco: Not on file  . Alcohol Use: Yes    No Known Allergies  Current Outpatient Prescriptions  Medication Sig Dispense Refill  . ALPRAZolam (XANAX) 1 MG tablet Take 1 tablet (1 mg total) by mouth 2 (two) times daily. As needed;   Please make return visit for further refills  60 tablet  11  . amphetamine-dextroamphetamine (ADDERALL) 30 MG tablet Take 30 mg by mouth 2 (two) times daily.        . valsartan (DIOVAN) 160 MG tablet Take 160 mg by mouth daily.        Marland Kitchen zolpidem (AMBIEN) 10 MG tablet Take 1 tablet (10 mg total) by mouth at bedtime as needed.  30 tablet  5    Review of Systems Review of Systems  Constitutional: Negative.   HENT: Negative.   Eyes: Negative.   Respiratory: Negative.     Cardiovascular: Negative.   Gastrointestinal: Negative.   Genitourinary: Negative.   Musculoskeletal: Negative.   Skin: Negative.   Neurological: Negative.   Hematological: Negative.   Psychiatric/Behavioral: Negative.     Blood pressure 130/90, pulse 72, temperature 98.4 F (36.9 C), resp. rate 12, height 6\' 3"  (1.905 m), weight 220 lb (99.791 kg).  Physical Exam Physical Exam  Constitutional: He is oriented to person, place, and time. He appears well-developed and well-nourished.  HENT:  Head: Normocephalic and atraumatic.  Nose: Nose normal.  Eyes: Conjunctivae and EOM are normal. Pupils are equal, round, and reactive to light.  Neck: Normal range of motion. Neck supple.  Cardiovascular: Normal rate, regular rhythm and normal heart sounds.  Exam reveals no gallop and no friction rub.   No murmur heard. Pulmonary/Chest: Effort normal and breath sounds normal.  Abdominal: Soft. Bowel sounds are normal.       Scar to RLQ.  No evidence of in transit disease  Musculoskeletal: Normal range of motion.  Neurological: He is alert and oriented to person, place, and time.  Skin: Skin is warm and dry.  Psychiatric: He has a normal mood and affect. His behavior is normal. Judgment and thought content normal.    Data Reviewed Path from core biopsy Right axilla -  Melanoma  PET uptake  Right axilla Assessment    Recurrent melanoma right axillary lymph nodes    Plan    The patient is in need of a right axillary lymph node dissection for re  current melanoma.  I discussed the procedure with him today. I also recommended a second opinion prior treatment at [facility] since they have active melanoma program there. He has an appointment to be seen at [facility] as well.  He will call me once the above done to set up surgery date.Risk of bleeding,  Infection,  Arm swelling,  Pain discussed with patient.       Lyanna Blystone A. 09/11/2011, 4:36 PM

## 2011-09-19 ENCOUNTER — Encounter (INDEPENDENT_AMBULATORY_CARE_PROVIDER_SITE_OTHER): Payer: BC Managed Care – PPO | Admitting: Surgery

## 2011-09-21 LAB — GLUCOSE, CAPILLARY: Glucose-Capillary: 90

## 2011-09-21 LAB — CBC
HCT: 46.6
MCV: 92.6
RBC: 5.03
WBC: 9.4

## 2011-10-16 ENCOUNTER — Ambulatory Visit: Payer: BC Managed Care – PPO | Admitting: Internal Medicine

## 2011-10-17 ENCOUNTER — Telehealth: Payer: Self-pay

## 2011-10-17 ENCOUNTER — Ambulatory Visit (INDEPENDENT_AMBULATORY_CARE_PROVIDER_SITE_OTHER): Payer: BC Managed Care – PPO | Admitting: Internal Medicine

## 2011-10-17 ENCOUNTER — Encounter: Payer: Self-pay | Admitting: Internal Medicine

## 2011-10-17 VITALS — BP 134/68 | HR 114 | Temp 98.5°F | Ht 75.0 in | Wt 228.5 lb

## 2011-10-17 DIAGNOSIS — J209 Acute bronchitis, unspecified: Secondary | ICD-10-CM

## 2011-10-17 DIAGNOSIS — Z0000 Encounter for general adult medical examination without abnormal findings: Secondary | ICD-10-CM

## 2011-10-17 DIAGNOSIS — R079 Chest pain, unspecified: Secondary | ICD-10-CM | POA: Insufficient documentation

## 2011-10-17 DIAGNOSIS — A64 Unspecified sexually transmitted disease: Secondary | ICD-10-CM | POA: Insufficient documentation

## 2011-10-17 DIAGNOSIS — C799 Secondary malignant neoplasm of unspecified site: Secondary | ICD-10-CM | POA: Insufficient documentation

## 2011-10-17 MED ORDER — HYDROCODONE-HOMATROPINE 5-1.5 MG/5ML PO SYRP: 5.0000 mL | ORAL_SOLUTION | Freq: Four times a day (QID) | ORAL | Status: AC | PRN

## 2011-10-17 MED ORDER — VALSARTAN 160 MG PO TABS: 160.0000 mg | ORAL_TABLET | Freq: Every day | ORAL | Status: DC

## 2011-10-17 MED ORDER — ALPRAZOLAM 1 MG PO TABS: 1.0000 mg | ORAL_TABLET | Freq: Two times a day (BID) | ORAL | Status: DC

## 2011-10-17 MED ORDER — SULFAMETHOXAZOLE-TRIMETHOPRIM 800-160 MG PO TABS: 1.0000 | ORAL_TABLET | Freq: Two times a day (BID) | ORAL | Status: AC

## 2011-10-17 MED ORDER — ZOLPIDEM TARTRATE 10 MG PO TABS: 10.0000 mg | ORAL_TABLET | Freq: Every evening | ORAL | Status: DC | PRN

## 2011-10-17 MED ORDER — OXYCODONE HCL 10 MG PO TABS: ORAL_TABLET | ORAL | Status: DC

## 2011-10-17 MED ORDER — AMPHETAMINE-DEXTROAMPHETAMINE 30 MG PO TABS: 30.0000 mg | ORAL_TABLET | Freq: Two times a day (BID) | ORAL | Status: DC

## 2011-10-17 NOTE — Patient Instructions (Signed)
Take all new medications as prescribed Continue all other medications as before Please go to XRAY in the Basement for the x-ray test Please go to LAB in the Basement for the blood and/or urine tests to be done today Please call the phone number [phone] (the PhoneTree System) for results of testing in 2-3 days;  When calling, simply dial the number, and when prompted enter the MRN number above (the Medical Record Number) and the # key, then the message should start.  

## 2011-10-17 NOTE — Assessment & Plan Note (Signed)
Overall doing well, age appropriate education and counseling updated, referrals for preventative services and immunizations addressed, dietary and smoking counseling addressed, most recent labs and ECG reviewed.  I have personally reviewed and have noted: 1) the patient's medical and social history 2) The pt's use of alcohol, tobacco, and illicit drugs 3) The patient's current medications and supplements 4) Functional ability including ADL's, fall risk, home safety risk, hearing and visual impairment 5) Diet and physical activities 6) Evidence for depression or mood disorder 7) The patient's height, weight, and BMI have been recorded in the chart I have made referrals, and provided counseling and education based on review of the above Declines other immunizations

## 2011-10-17 NOTE — Telephone Encounter (Signed)
Message copied by Pincus Sanes on Wed Oct 17, 2011  3:36 PM ------      Message from: Corwin Levins      Created: Wed Oct 17, 2011  1:14 PM       Ok to try otc cortaid, or skin moisturizer prn      ----- Message -----         From: Scharlene Gloss         Sent: 10/17/2011  11:58 AM           To: Oliver Barre, MD            The patient forgot to show you redness, dry skin and irritation between his fingers. He stated usually starts with cold weather. Please advise OTC or rx to help.

## 2011-10-17 NOTE — Telephone Encounter (Signed)
Called and informed the patient.

## 2011-10-17 NOTE — Progress Notes (Signed)
Subjective:    Patient ID: Evan [name], male    DOB: April 21, 1971, 40 y.o.   MRN: [id number]  HPI   Here for wellness and f/u;  Overall doing ok;  Pt denies CP, worsening SOB, DOE, wheezing, orthopnea, PND, worsening LE edema, palpitations, dizziness or syncope.  Pt denies neurological change such as new Headache, facial or extremity weakness.  Pt denies polydipsia, polyuria, or low sugar symptoms. Pt states overall good compliance with treatment and medications, good tolerability, and trying to follow lower cholesterol diet.  Pt denies worsening depressive symptoms, suicidal ideation or panic. No fever, wt loss, night sweats, loss of appetite, or other constitutional symptoms.  Pt states good ability with ADL's, low fall risk, home safety reviewed and adequate, no significant changes in hearing or vision, and occasionally active with exercise.  Also Here after recurrence of met melanoma, now s/p full right axillary LN dissection at [facility], with drain to right chest/axilla still in place, with some postop or ? Now more chronic RUE lymphedema; here today with Here with acute onset mild to mod 2-3 days  general weakness and malaise, with prod cough greenish sputum, but Pt denies chest pain except for persistent pain right axilla with the swelling, conts to drain too much to remove  - but did see surgury yesterday with doxy infused in the drain, but no increased sob or doe, wheezing, orthopnea, PND, increased LE swelling, palpitations, dizziness or syncope.  Pain overall 3/10 at rest, but 10/10 with drain manipulation or signficant right shoulder movement especially posterioly Now consiered Stage 3B met melanoma after biopsy/PET scan/surgury.  Also plans to return to [facility] for probably inclusion in clinical trial ECOG 1609 for experimental chemo protocol to be randomized to interferon vs Ipi. Has only lost 5 lbs since oct 2011. Peak wt 338 about 10 yrs ago.  Denies worsening depressive symptoms,  suicidal ideation, or panic, though has ongoing anxiety.  Asks for STD labs, though vague and denies specific sympotms such as ulcer, rash, or d/c Past Medical History  Diagnosis Date  . ANXIETY 12/10/2007  . BACK PAIN 08/03/2009  . ERECTILE DYSFUNCTION 12/10/2007  . HYPERTENSION 11/21/2007  . INSOMNIA-SLEEP DISORDER-UNSPEC 12/20/2008  . Preventative health care 05/14/2011  . Melanoma    No past surgical history on file.  reports that he has quit smoking. He does not have any smokeless tobacco history on file. He reports that he drinks alcohol. He reports that he does not use illicit drugs. family history includes Cancer in his other. No Known Allergies Current Outpatient Prescriptions on File Prior to Visit  Medication Sig Dispense Refill  . ALPRAZolam (XANAX) 1 MG tablet Take 1 tablet (1 mg total) by mouth 2 (two) times daily. As needed;   Please make return visit for further refills  60 tablet  11  . amphetamine-dextroamphetamine (ADDERALL) 30 MG tablet Take 30 mg by mouth 2 (two) times daily.        . valsartan (DIOVAN) 160 MG tablet Take 160 mg by mouth daily.        Marland Kitchen zolpidem (AMBIEN) 10 MG tablet Take 1 tablet (10 mg total) by mouth at bedtime as needed.  30 tablet  5   Review of Systems Review of Systems  Constitutional: Negative for diaphoresis and unexpected weight change.  HENT: Negative for drooling and tinnitus.   Eyes: Negative for photophobia and visual disturbance.  Respiratory: Negative for choking and stridor.   Gastrointestinal: Negative for vomiting and blood in stool.  Genitourinary: Negative for hematuria and decreased urine volume.     Objective:   Physical Exam BP 134/68  Pulse 114  Temp(Src) 98.5 F (36.9 C) (Oral)  Ht 6\' 3"  (1.905 m)  Wt 228 lb 8 oz (103.647 kg)  BMI 28.56 kg/m2  SpO2 99% Physical Exam  VS noted, ? Mild ill appearing, mild warm though temp is normal Constitutional: Pt appears well-developed and well-nourished.  HENT: Head:  Normocephalic.  Right Ear: External ear normal.  Left Ear: External ear normal.  Eyes: Conjunctivae and EOM are normal. Pupils are equal, round, and reactive to light.  Neck: Normal range of motion. Neck supple.  Cardiovascular: Normal rate and regular rhythm.   Pulmonary/Chest: Effort normal and breath sounds normal.  Abd:  Soft, NT, non-distended, + BS Neurological: Pt is alert. No cranial nerve deficit.  Skin: Skin is warm. No erythema. except for 1 cm area about the tube insertion site right chest; severe tender diffuse axillary area Psychiatric: Pt behavior is normal. Thought content normal.     Assessment & Plan:

## 2011-10-19 ENCOUNTER — Ambulatory Visit: Payer: BC Managed Care – PPO | Admitting: Internal Medicine

## 2011-10-21 ENCOUNTER — Encounter: Payer: Self-pay | Admitting: Internal Medicine

## 2011-10-21 NOTE — Assessment & Plan Note (Signed)
Mild to mod, for antibx course,  to f/u any worsening symptoms or concerns 

## 2011-10-21 NOTE — Assessment & Plan Note (Signed)
For pain control asd,  to f/u any worsening symptoms or concerns, to f/u surgury and melanoma tx as he has planned

## 2011-10-21 NOTE — Assessment & Plan Note (Signed)
Pt requests STD labs - ok to order,  to f/u any worsening symptoms or concerns

## 2011-10-26 ENCOUNTER — Telehealth: Payer: Self-pay | Admitting: Oncology

## 2011-11-13 ENCOUNTER — Other Ambulatory Visit: Payer: Self-pay

## 2011-11-13 MED ORDER — OXYCODONE HCL 10 MG PO TABS: ORAL_TABLET | ORAL | Status: DC

## 2011-11-13 NOTE — Telephone Encounter (Signed)
Done hardcopy to robin  

## 2011-11-13 NOTE — Telephone Encounter (Signed)
Patient requesting refill on oxycodone. Call back number when ready [phone]

## 2011-11-14 NOTE — Telephone Encounter (Signed)
Called the patient informed prescription is ready for pickup at front desk. 

## 2011-11-29 ENCOUNTER — Telehealth: Payer: Self-pay | Admitting: Medical Oncology

## 2011-11-29 NOTE — Telephone Encounter (Signed)
I called pt to see if he is being treated at [facility]. He states he has one more radiation treatment today. He is meeting today with his oncologist to see about a clinical trial. He has an appointment with Dr. Arline Asp Monday and since he is being seen at [facility] Dr. Arline Asp did not feel he has to come here. We can always reschedule him here when he completes his treatments.

## 2011-12-03 ENCOUNTER — Other Ambulatory Visit: Payer: BC Managed Care – PPO

## 2011-12-03 ENCOUNTER — Ambulatory Visit: Payer: BC Managed Care – PPO | Admitting: Oncology

## 2011-12-12 ENCOUNTER — Other Ambulatory Visit: Payer: Self-pay

## 2011-12-12 ENCOUNTER — Other Ambulatory Visit: Payer: Self-pay | Admitting: Internal Medicine

## 2011-12-12 MED ORDER — OXYCODONE HCL 10 MG PO TABS: ORAL_TABLET | ORAL | Status: DC

## 2011-12-12 NOTE — Telephone Encounter (Signed)
Pt called requesting refill of pain medication due to increased pain because of radiation to treat melanoma, please advise.

## 2011-12-12 NOTE — Telephone Encounter (Signed)
Pt informed, Rx in cabinet for pt pick up  

## 2011-12-12 NOTE — Telephone Encounter (Signed)
Done hardcopy to dahlia/LIM B  

## 2012-01-15 ENCOUNTER — Other Ambulatory Visit: Payer: Self-pay

## 2012-01-15 MED ORDER — OXYCODONE HCL 10 MG PO TABS: ORAL_TABLET | ORAL | Status: DC

## 2012-01-15 NOTE — Telephone Encounter (Signed)
Called the patient informed prescription is ready for pickup at the front desk. 

## 2012-02-12 ENCOUNTER — Other Ambulatory Visit: Payer: Self-pay

## 2012-02-12 ENCOUNTER — Encounter: Payer: Self-pay | Admitting: Internal Medicine

## 2012-02-12 ENCOUNTER — Ambulatory Visit (INDEPENDENT_AMBULATORY_CARE_PROVIDER_SITE_OTHER): Payer: BC Managed Care – PPO | Admitting: Internal Medicine

## 2012-02-12 VITALS — BP 172/102 | HR 98 | Temp 97.0°F | Ht 75.0 in | Wt 213.4 lb

## 2012-02-12 DIAGNOSIS — I10 Essential (primary) hypertension: Secondary | ICD-10-CM

## 2012-02-12 DIAGNOSIS — L02519 Cutaneous abscess of unspecified hand: Secondary | ICD-10-CM

## 2012-02-12 DIAGNOSIS — F411 Generalized anxiety disorder: Secondary | ICD-10-CM

## 2012-02-12 DIAGNOSIS — L03119 Cellulitis of unspecified part of limb: Secondary | ICD-10-CM

## 2012-02-12 MED ORDER — LOSARTAN POTASSIUM 100 MG PO TABS: 100.0000 mg | ORAL_TABLET | Freq: Every day | ORAL | Status: DC

## 2012-02-12 MED ORDER — OXYCODONE HCL 10 MG PO TABS: ORAL_TABLET | ORAL | Status: DC

## 2012-02-12 NOTE — Assessment & Plan Note (Addendum)
Ok to stay off doivan for now as BP have been 130's sbp until today, with recent wt loss

## 2012-02-12 NOTE — Patient Instructions (Addendum)
Ok to stop the diovan as you have Please go to ER now as you likely have an abscess that will need addressed today, and may require IV antibiotics

## 2012-02-12 NOTE — Telephone Encounter (Signed)
Done hardcopy to robin  

## 2012-02-12 NOTE — Telephone Encounter (Signed)
Called informed the patients wife hardcopy is ready for pickup at the front desk.

## 2012-02-12 NOTE — Assessment & Plan Note (Addendum)
Dorsal left hand near wrist, approx 1.5 cm, I think may need I&D - should go to ER for further eval and tx now; should have antibx and pain med, may need inpt stay;  It is not clear he intends to do this however as he states several times he might consider going to an UC when gets to [facility];  I tried to contact Dr's Sypher/gramig but unable to see in office as they are all in surgury to end of regular hours;  I still urged pt to go to ER where Dr Amanda Pea can be consulted as he is on call for ER today

## 2012-02-16 ENCOUNTER — Encounter: Payer: Self-pay | Admitting: Internal Medicine

## 2012-02-16 NOTE — Progress Notes (Signed)
Subjective:    Patient ID: Evan [name], male    DOB: [birth year], 41 y.o.   MRN: [id number]  HPI  Here as acute walkin today, c/o acute onset 2 days let post hand redness and localized area of pain/swellling;  Did have blood draw to the area 4 days prior for purpose of his cancer tx;  Denies fever, chills and Pt denies chest pain, increased sob or doe, wheezing, orthopnea, PND, increased LE swelling, palpitations, dizziness or syncope.  Pt denies new neurological symptoms such as new headache, or facial or extremity weakness or numbness   Pt denies polydipsia, polyuria,  Pt states overall good compliance with meds, trying to follow lower cholesterol diet, wt overall stable but little exercise however.   Has lost some wt since last seen, but BP has been< 140/90, pt is adamant.  Overall good compliance with treatment, and good medicine tolerability.  States he does not want to be ill as he has a plane to catch 630 pm tonight, has [place] trip with family to attend that has been planned for some time  Denies worsening depressive symptoms, suicidal ideation, or panic, though has ongoing anxiety, not increased recently per pt Past Medical History  Diagnosis Date  . ANXIETY 12/10/2007  . BACK PAIN 08/03/2009  . ERECTILE DYSFUNCTION 12/10/2007  . HYPERTENSION 11/21/2007  . INSOMNIA-SLEEP DISORDER-UNSPEC 12/20/2008  . Preventative health care 05/14/2011  . Melanoma    Past Surgical History  Procedure Date  . Shoulder surgery     chronic L dislocating     reports that he has quit smoking. He does not have any smokeless tobacco history on file. He reports that he drinks alcohol. He reports that he does not use illicit drugs. family history includes Cancer in his father and other. Allergies  Allergen Reactions  . Other Swelling    seafood   Current Outpatient Prescriptions on File Prior to Visit  Medication Sig Dispense Refill  . ALPRAZolam (XANAX) 1 MG tablet Take 1 tablet (1 mg total) by mouth 2  (two) times daily. As needed  60 tablet  3  . amphetamine-dextroamphetamine (ADDERALL) 30 MG tablet Take 1 tablet (30 mg total) by mouth 2 (two) times daily.  60 tablet  0  . Oxycodone HCl 10 MG TABS 1-2 tab by mouth every 6 hrs as needed for pain with maximum 4 tabs per day, to fill Feb 14, 2012  120 tablet  0  . VIAGRA 100 MG tablet TAKE ONE TABLET BY MOUTH ONCE A DAY AS  NEEDED  6 tablet  0  . zolpidem (AMBIEN) 10 MG tablet Take 1 tablet (10 mg total) by mouth at bedtime as needed.  30 tablet  5   Review of Systems Review of Systems  Constitutional: Negative for diaphoresis and unexpected weight change.  HENT: Negative for drooling and tinnitus.   Eyes: Negative for photophobia and visual disturbance.  Respiratory: Negative for choking and stridor.   Gastrointestinal: Negative for vomiting and blood in stool.  Genitourinary: Negative for hematuria and decreased urine volume.    Objective:   Physical Exam BP 172/102  Pulse 98  Temp(Src) 97 F (36.1 C) (Oral)  Ht 6\' 3"  (1.905 m)  Wt 213 lb 6 oz (96.786 kg)  BMI 26.67 kg/m2  SpO2 99% Physical Exam  VS noted, obese but appears to have lost further wt Constitutional: Pt appears well-developed and well-nourished.  HENT: Head: Normocephalic.  Right Ear: External ear normal.  Left Ear: External ear normal.  Eyes: Conjunctivae and EOM are normal. Pupils are equal, round, and reactive to light.  Neck: Normal range of motion. Neck supple.  Cardiovascular: Normal rate and regular rhythm.   Pulmonary/Chest: Effort normal and breath sounds normal.  Neurological: Pt is alert. No cranial nerve deficit.  Skin: Skin is warm. No erythema. except for left post hand with 1 cm area red/swelling/marked tender with erythema to most of the post hand, but no wrist effusion or red streaks, no fluctuance Psychiatric: Pt behavior is normal. Thought content normal. 1+ nervous    Assessment & Plan:

## 2012-02-16 NOTE — Assessment & Plan Note (Signed)
stable overall by hx and exam, most recent data reviewed with pt, and pt to continue medical treatment as before Lab Results  Component Value Date   WBC 8.9 08/31/2011   HGB 13.5 08/31/2011   HCT 41.4 08/31/2011   PLT 329 08/31/2011   GLUCOSE 91 08/17/2011   ALT 20 08/17/2011   AST 22 08/17/2011   NA 139 08/17/2011   K 4.3 08/17/2011   CL 106 08/17/2011   CREATININE 0.89 08/17/2011   BUN 22 08/17/2011   CO2 24 08/17/2011   INR 0.85 08/31/2011      stable overall by hx and exam, most recent data reviewed with pt, and pt to continue medical treatment as before  Lab Results  Component Value Date   WBC 8.9 08/31/2011   HGB 13.5 08/31/2011   HCT 41.4 08/31/2011   PLT 329 08/31/2011   GLUCOSE 91 08/17/2011   ALT 20 08/17/2011   AST 22 08/17/2011   NA 139 08/17/2011   K 4.3 08/17/2011   CL 106 08/17/2011   CREATININE 0.89 08/17/2011   BUN 22 08/17/2011   CO2 24 08/17/2011   INR 0.85 08/31/2011

## 2012-03-03 ENCOUNTER — Ambulatory Visit: Payer: BC Managed Care – PPO | Attending: Internal Medicine | Admitting: Physical Therapy

## 2012-03-06 ENCOUNTER — Other Ambulatory Visit: Payer: Self-pay | Admitting: Internal Medicine

## 2012-03-06 ENCOUNTER — Other Ambulatory Visit: Payer: Self-pay

## 2012-03-06 MED ORDER — ALPRAZOLAM 1 MG PO TABS: 1.0000 mg | ORAL_TABLET | Freq: Two times a day (BID) | ORAL | Status: DC

## 2012-03-06 NOTE — Telephone Encounter (Signed)
Done hardcopy to robin  

## 2012-03-07 NOTE — Telephone Encounter (Signed)
Faxed hardcopy to pharmacy. 

## 2012-03-10 ENCOUNTER — Ambulatory Visit (INDEPENDENT_AMBULATORY_CARE_PROVIDER_SITE_OTHER): Payer: BC Managed Care – PPO | Admitting: Internal Medicine

## 2012-03-10 ENCOUNTER — Encounter: Payer: Self-pay | Admitting: Internal Medicine

## 2012-03-10 DIAGNOSIS — G4700 Insomnia, unspecified: Secondary | ICD-10-CM

## 2012-03-10 DIAGNOSIS — I10 Essential (primary) hypertension: Secondary | ICD-10-CM

## 2012-03-10 DIAGNOSIS — R079 Chest pain, unspecified: Secondary | ICD-10-CM

## 2012-03-10 DIAGNOSIS — L03119 Cellulitis of unspecified part of limb: Secondary | ICD-10-CM

## 2012-03-10 DIAGNOSIS — L02519 Cutaneous abscess of unspecified hand: Secondary | ICD-10-CM

## 2012-03-10 MED ORDER — ESZOPICLONE 3 MG PO TABS: 3.0000 mg | ORAL_TABLET | Freq: Every day | ORAL | Status: DC

## 2012-03-10 MED ORDER — OXYCODONE HCL 15 MG PO TABS: 15.0000 mg | ORAL_TABLET | Freq: Four times a day (QID) | ORAL | Status: AC | PRN

## 2012-03-10 NOTE — Patient Instructions (Addendum)
Ok to stop the oxycodone 10 Take all new medications as prescribed  - the oxycodone 15 OK to stop the [place] Take all new medications as prescribed - the Lunesta Continue all other medications as before Please have the pharmacy call with any refills you may need. Please return in 3 months, or sooner if needed

## 2012-03-16 ENCOUNTER — Encounter: Payer: Self-pay | Admitting: Internal Medicine

## 2012-03-16 NOTE — Progress Notes (Signed)
Subjective:    Patient ID: Evan [name], male    DOB: [birth year], 41 y.o.   MRN: [id number]   HPI  Here to f/u;  Despite being asked to go to ER last visit, he did fly to [place], seen there next day, hand worse with abscess and hospd approx 5 days with IV antibx;  Hand much improved, driains now out, no further pain, swelling, erythema.  Has still trouble with sleep, ambien not working, asks for [employer] instead.  Denies worsening depressive symptoms, suicidal ideation, or panic, though has ongoing anxiety, not increased lately.  Overall pain med needs to be adjusted as still signficant pain to right axillary area with chronic lymphedema s/p axillary dissection.  Pt denies chest pain, increased sob or doe, wheezing, orthopnea, PND, increased LE swelling, palpitations, dizziness or syncope.  Pt denies new neurological symptoms such as new headache, or facial or extremity weakness or numbness   Pt denies polydipsia, polyuria.   Pt denies fever, wt loss, night sweats, loss of appetite, or other constitutional symptoms Past Medical History  Diagnosis Date  . ANXIETY 12/10/2007  . BACK PAIN 08/03/2009  . ERECTILE DYSFUNCTION 12/10/2007  . HYPERTENSION 11/21/2007  . INSOMNIA-SLEEP DISORDER-UNSPEC 12/20/2008  . Preventative health care 05/14/2011  . Melanoma    Past Surgical History  Procedure Date  . Shoulder surgery     chronic L dislocating     reports that he has quit smoking. He does not have any smokeless tobacco history on file. He reports that he drinks alcohol. He reports that he does not use illicit drugs. family history includes Cancer in his father and other. Allergies  Allergen Reactions  . Other Swelling    seafood   Current Outpatient Prescriptions on File Prior to Visit  Medication Sig Dispense Refill  . ALPRAZolam (XANAX) 1 MG tablet Take 1 tablet (1 mg total) by mouth 2 (two) times daily. As needed  60 tablet  3  . amphetamine-dextroamphetamine (ADDERALL) 30 MG tablet Take 1  tablet (30 mg total) by mouth 2 (two) times daily.  60 tablet  0  . Oxycodone HCl 10 MG TABS 1-2 tab by mouth every 6 hrs as needed for pain with maximum 4 tabs per day, to fill Feb 14, 2012  120 tablet  0  . VIAGRA 100 MG tablet TAKE ONE TABLET BY MOUTH ONCE A DAY AS  NEEDED  6 tablet  1   Review of Systems Review of Systems  Constitutional: Negative for diaphoresis and unexpected weight change.  HENT: Negative for drooling and tinnitus.   Eyes: Negative for photophobia and visual disturbance.  Respiratory: Negative for choking and stridor.   Gastrointestinal: Negative for vomiting and blood in stool.  Genitourinary: Negative for hematuria and decreased urine volume.  Musculoskeletal: Negative for gait problem.  Skin: Negative for color change and wound.     Objective:   Physical Exam BP 142/86  Pulse 97  Temp(Src) 97.6 F (36.4 C) (Oral)  Ht 6\' 3"  (1.905 m)  Wt 207 lb 8 oz (94.121 kg)  BMI 25.94 kg/m2  SpO2 99% Physical Exam  VS noted Constitutional: Pt appears well-developed and well-nourished.  HENT: Head: Normocephalic.  Right Ear: External ear normal.  Left Ear: External ear normal.  Eyes: Conjunctivae and EOM are normal. Pupils are equal, round, and reactive to light.  Neck: Normal range of motion. Neck supple.  Cardiovascular: Normal rate and regular rhythm.   Pulmonary/Chest: Effort normal and breath sounds normal.  Neurological: Pt is  alert. No cranial nerve deficit.  Skin: Skin is warm. No erythema. Left hand without red, tender, swelling Psychiatric: Pt behavior is normal. Thought content normal.  1+ nervous, not depressed affect    Assessment & Plan:

## 2012-03-16 NOTE — Assessment & Plan Note (Signed)
Resolved,  to f/u any worsening symptoms or concerns  

## 2012-03-16 NOTE — Assessment & Plan Note (Signed)
Chronic s/p right axillary dissection, for oxycodone 15 mg asd,  to f/u any worsening symptoms or concerns

## 2012-03-16 NOTE — Assessment & Plan Note (Signed)
For lunesta asd,  to f/u any worsening symptoms or concerns, d/c [place]

## 2012-03-16 NOTE — Assessment & Plan Note (Signed)
stable overall by hx and exam, most recent data reviewed with pt, and pt to continue medical treatment as before  BP Readings from Last 3 Encounters:  03/10/12 142/86  02/12/12 172/102  10/17/11 134/68

## 2012-03-28 ENCOUNTER — Telehealth: Payer: Self-pay

## 2012-03-28 MED ORDER — ALPRAZOLAM 1 MG PO TABS: ORAL_TABLET | ORAL | Status: DC

## 2012-03-28 NOTE — Telephone Encounter (Signed)
Xanax rx changed - Done hardcopy to robin

## 2012-03-28 NOTE — Telephone Encounter (Signed)
Patient called to inform Evan [name] works well, but too expensive. The patient cannot afford to fill again and would like to increase xanax for sleep as discussed at last OV. Call back number is [phone]

## 2012-03-31 NOTE — Telephone Encounter (Signed)
Faxed hardcopy to pharmacy. 

## 2012-04-04 ENCOUNTER — Other Ambulatory Visit: Payer: Self-pay

## 2012-04-04 ENCOUNTER — Other Ambulatory Visit: Payer: Self-pay | Admitting: Internal Medicine

## 2012-04-04 MED ORDER — OXYCODONE HCL 15 MG PO TABS: 15.0000 mg | ORAL_TABLET | Freq: Four times a day (QID) | ORAL | Status: AC | PRN

## 2012-04-04 MED ORDER — OXYCODONE HCL 10 MG PO TABS: ORAL_TABLET | ORAL | Status: DC

## 2012-04-04 NOTE — Telephone Encounter (Signed)
rx re-done for correct rx

## 2012-04-04 NOTE — Telephone Encounter (Signed)
Patient is going out of town on early Monday morning and would like his oxycodone to be filled. He states this may be an early refill. Please advise

## 2012-04-04 NOTE — Telephone Encounter (Signed)
Called the patient left message that prescription requested is ready for pickup. 

## 2012-04-04 NOTE — Telephone Encounter (Addendum)
Ok for early refill this time only  Done hardcopy to [employer]

## 2012-04-24 DIAGNOSIS — K7200 Acute and subacute hepatic failure without coma: Secondary | ICD-10-CM | POA: Insufficient documentation

## 2012-04-30 ENCOUNTER — Other Ambulatory Visit: Payer: Self-pay | Admitting: Internal Medicine

## 2012-04-30 MED ORDER — OXYCODONE HCL 15 MG PO TABS: ORAL_TABLET | ORAL | Status: DC

## 2012-04-30 MED ORDER — OXYCODONE HCL 10 MG PO TABS: ORAL_TABLET | ORAL | Status: DC

## 2012-04-30 NOTE — Telephone Encounter (Signed)
rx corrected, pt given corrected, other destroyed

## 2012-04-30 NOTE — Telephone Encounter (Signed)
Done hardcopy to robin  

## 2012-04-30 NOTE — Telephone Encounter (Signed)
Called the patient informed prescription is ready for pickup. 

## 2012-04-30 NOTE — Telephone Encounter (Signed)
Pt requesting oxycodone prescription---to be picked up.

## 2012-05-13 ENCOUNTER — Other Ambulatory Visit: Payer: Self-pay | Admitting: Dermatology

## 2012-05-28 ENCOUNTER — Other Ambulatory Visit: Payer: Self-pay

## 2012-05-28 MED ORDER — ZOLPIDEM TARTRATE 10 MG PO TABS: 10.0000 mg | ORAL_TABLET | Freq: Every evening | ORAL | Status: DC | PRN

## 2012-05-28 NOTE — Telephone Encounter (Signed)
Pharmacy requesting refill on Zolpidem 10mg

## 2012-05-28 NOTE — Telephone Encounter (Signed)
Done hardcopy to robin  

## 2012-05-29 ENCOUNTER — Other Ambulatory Visit: Payer: Self-pay

## 2012-05-29 MED ORDER — OXYCODONE HCL 15 MG PO TABS: ORAL_TABLET | ORAL | Status: DC

## 2012-05-29 NOTE — Telephone Encounter (Signed)
Called the patient left a detailed message that prescription requested is ready for pickup at the front desk. 

## 2012-05-29 NOTE — Telephone Encounter (Signed)
Done hardcopy to robin  

## 2012-05-29 NOTE — Telephone Encounter (Signed)
Faxed hardcopy to pharmacy. 

## 2012-06-25 ENCOUNTER — Other Ambulatory Visit: Payer: Self-pay | Admitting: Internal Medicine

## 2012-06-25 ENCOUNTER — Other Ambulatory Visit: Payer: Self-pay

## 2012-06-25 MED ORDER — OXYCODONE HCL 15 MG PO TABS: ORAL_TABLET | ORAL | Status: DC

## 2012-06-25 NOTE — Telephone Encounter (Signed)
Done hardcopy to robin  

## 2012-06-25 NOTE — Telephone Encounter (Signed)
Patient informed prescription is ready f or pickup at the front desk. 

## 2012-07-23 ENCOUNTER — Other Ambulatory Visit: Payer: Self-pay

## 2012-07-23 MED ORDER — ALPRAZOLAM 1 MG PO TABS: ORAL_TABLET | ORAL | Status: DC

## 2012-07-23 MED ORDER — OXYCODONE HCL 15 MG PO TABS: ORAL_TABLET | ORAL | Status: DC

## 2012-07-23 NOTE — Telephone Encounter (Signed)
Faxed hardcopy to pharmacy. 

## 2012-07-23 NOTE — Telephone Encounter (Signed)
Please advise in Dr John's absence, thanks! 

## 2012-07-23 NOTE — Telephone Encounter (Signed)
Ok to refill -1 mo, no refills per protocol (covering for usual PCP)

## 2012-07-23 NOTE — Telephone Encounter (Signed)
Pt informed, Rx in cabinet for pt pick up  

## 2012-08-15 ENCOUNTER — Telehealth: Payer: Self-pay

## 2012-08-15 ENCOUNTER — Other Ambulatory Visit: Payer: Self-pay

## 2012-08-15 MED ORDER — OXYCODONE HCL 15 MG PO TABS: ORAL_TABLET | ORAL | Status: DC

## 2012-08-15 NOTE — Telephone Encounter (Signed)
Pt called stating that Dr Dan Europe at [facility] had written an Rx for oxycodone 5mg  #120 and he had lost Rx. He stated [facility] told him to call dr Arline Asp to see if he could refill b/c [facility] is closer. I called [facility] who stated they last filled oxycodone 15mg  #120 on 07/23/12. I then called [facility] and left a message. Dr Arline Asp stated he would not refill the Rx. Called pt back stating that [facility] would not refill rx and pt would need to call [facility].

## 2012-08-15 NOTE — Telephone Encounter (Signed)
Called the patient informed prescription is ready for pickup. 

## 2012-08-15 NOTE — Telephone Encounter (Signed)
Pt called requesting a refill of Oxycodone to fill today because he will be going out of town tonight, please advise

## 2012-08-15 NOTE — Telephone Encounter (Signed)
Done hardcopy to robin  Please let pt know, his rx limit is 4 tabs per day, last rx was done per Dr Everardo All July 31, so he is not due for refill until aug 30

## 2012-08-21 ENCOUNTER — Other Ambulatory Visit: Payer: Self-pay | Admitting: *Deleted

## 2012-08-21 MED ORDER — ALPRAZOLAM 1 MG PO TABS: ORAL_TABLET | ORAL | Status: DC

## 2012-08-21 NOTE — Telephone Encounter (Signed)
Faxed script back tp brown gardiner...Raechel Chute

## 2012-08-21 NOTE — Telephone Encounter (Signed)
Done hardcopy to robin  

## 2012-09-10 ENCOUNTER — Ambulatory Visit: Payer: BC Managed Care – PPO | Admitting: Internal Medicine

## 2012-09-10 DIAGNOSIS — Z0289 Encounter for other administrative examinations: Secondary | ICD-10-CM

## 2012-09-15 ENCOUNTER — Telehealth: Payer: Self-pay | Admitting: Internal Medicine

## 2012-09-15 ENCOUNTER — Ambulatory Visit (INDEPENDENT_AMBULATORY_CARE_PROVIDER_SITE_OTHER): Payer: BC Managed Care – PPO | Admitting: Internal Medicine

## 2012-09-15 ENCOUNTER — Encounter: Payer: Self-pay | Admitting: Internal Medicine

## 2012-09-15 VITALS — BP 140/88 | HR 96 | Temp 96.9°F | Ht 75.0 in | Wt 197.1 lb

## 2012-09-15 DIAGNOSIS — J209 Acute bronchitis, unspecified: Secondary | ICD-10-CM

## 2012-09-15 DIAGNOSIS — I10 Essential (primary) hypertension: Secondary | ICD-10-CM

## 2012-09-15 DIAGNOSIS — R079 Chest pain, unspecified: Secondary | ICD-10-CM

## 2012-09-15 DIAGNOSIS — F411 Generalized anxiety disorder: Secondary | ICD-10-CM

## 2012-09-15 DIAGNOSIS — Z0000 Encounter for general adult medical examination without abnormal findings: Secondary | ICD-10-CM

## 2012-09-15 MED ORDER — HYDROCOD POLST-CHLORPHEN POLST 10-8 MG/5ML PO LQCR: 5.0000 mL | Freq: Two times a day (BID) | ORAL | Status: DC | PRN

## 2012-09-15 MED ORDER — LEVOFLOXACIN 250 MG PO TABS: 250.0000 mg | ORAL_TABLET | Freq: Every day | ORAL | Status: DC

## 2012-09-15 MED ORDER — OXYCODONE HCL 15 MG PO TABS: ORAL_TABLET | ORAL | Status: DC

## 2012-09-15 MED ORDER — ALPRAZOLAM 1 MG PO TABS: ORAL_TABLET | ORAL | Status: DC

## 2012-09-15 NOTE — Telephone Encounter (Signed)
Message copied by Corwin Levins on Mon Sep 15, 2012  4:30 PM ------      Message from: Scharlene Gloss B      Created: Mon Sep 15, 2012  3:18 PM       Patient would like cough medicine sent to pharmacy

## 2012-09-15 NOTE — Progress Notes (Signed)
Subjective:    Patient ID: Evan [name], male    DOB: 12-Nov-1971, 41 y.o.   MRN: [id number]  HPI  Here for wellness and f/u;  Overall doing ok;  Pt denies CP, worsening SOB, DOE, wheezing, orthopnea, PND, worsening LE edema, palpitations, dizziness or syncope.  Pt denies neurological change such as new Headache, facial or extremity weakness.  Pt denies polydipsia, polyuria, or low sugar symptoms. Pt states overall good compliance with treatment and medications, good tolerability, and trying to follow lower cholesterol diet.  Pt denies worsening depressive symptoms, suicidal ideation or panic. No fever, wt loss, night sweats, loss of appetite, or other constitutional symptoms.  Pt states good ability with ADL's, low fall risk, home safety reviewed and adequate, no significant changes in hearing or vision, and occasionally active with exercise.  Also with incidental prod cough and slight lightheaded with cough for 3 days. Has CT planned for oct 9 - oncology, [facility] with labs, delcines further labs now.  Right arm swelling lymphedema no change, has right arm sleeve.  Overall pain no change, doing ok with med  Past Medical History  Diagnosis Date  . ANXIETY 12/10/2007  . BACK PAIN 08/03/2009  . ERECTILE DYSFUNCTION 12/10/2007  . HYPERTENSION 11/21/2007  . INSOMNIA-SLEEP DISORDER-UNSPEC 12/20/2008  . Preventative health care 05/14/2011  . Melanoma    Past Surgical History  Procedure Date  . Shoulder surgery     chronic L dislocating     reports that he has quit smoking. He does not have any smokeless tobacco history on file. He reports that he drinks alcohol. He reports that he does not use illicit drugs. family history includes Cancer in his father and other. Allergies  Allergen Reactions  . Other Swelling    seafood   Current Outpatient Prescriptions on File Prior to Visit  Medication Sig Dispense Refill  . ALPRAZolam (XANAX) 1 MG tablet 1 tab by mouth in the am, and 1-2 tab by  mouth in the PM  90 tablet  2  . Eszopiclone 3 MG TABS Take 1 tablet (3 mg total) by mouth at bedtime. Take immediately before bedtime  30 tablet  2  . oxyCODONE (ROXICODONE) 15 MG immediate release tablet 1 tab by mouth every 4-6 hrs as needed for pain, with maximum 4 tabs per day; to fill Aug 22, 2012  120 tablet  0  . VIAGRA 100 MG tablet TAKE ONE TABLET BY MOUTH ONCE A DAY AS  NEEDED  6 tablet  1  . amphetamine-dextroamphetamine (ADDERALL) 30 MG tablet Take 1 tablet (30 mg total) by mouth 2 (two) times daily.  60 tablet  0  . zolpidem (AMBIEN) 10 MG tablet Take 1 tablet (10 mg total) by mouth at bedtime as needed for sleep.  30 tablet  5   Review of Systems Review of Systems  Constitutional: Negative for diaphoresis, activity change, appetite change and unexpected weight change.  HENT: Negative for hearing loss, ear pain, facial swelling, mouth sores and neck stiffness.   Eyes: Negative for pain, redness and visual disturbance.  Respiratory: Negative for shortness of breath and wheezing.   Cardiovascular: Negative for chest pain and palpitations.  Gastrointestinal: Negative for diarrhea, blood in stool, abdominal distention and rectal pain.  Genitourinary: Negative for hematuria, flank pain and decreased urine volume.  Musculoskeletal: Negative for myalgias and joint swelling.  Skin: Negative for color change and wound.  Neurological: Negative for syncope and numbness.  Hematological: Negative for adenopathy.  Psychiatric/Behavioral: Negative  for hallucinations, self-injury, decreased concentration and agitation.      Objective:   Physical Exam BP 140/88  Pulse 96  Temp 96.9 F (36.1 C) (Oral)  Ht 6\' 3"  (1.905 m)  Wt 197 lb 2 oz (89.415 kg)  BMI 24.64 kg/m2  SpO2 99% Physical Exam  VS noted Constitutional: Pt is oriented to person, place, and time. Appears well-developed and well-nourished.  HENT:  Head: Normocephalic and atraumatic.  Right Ear: External ear normal.  Left  Ear: External ear normal.  Nose: Nose normal.  Mouth/Throat: Oropharynx is clear and moist.  Bilat tm's mild erythema.  Sinus nontender.  Pharynx mild erythema Eyes: Conjunctivae and EOM are normal. Pupils are equal, round, and reactive to light.  Neck: Normal range of motion. Neck supple. No JVD present. No tracheal deviation present.  Cardiovascular: Normal rate, regular rhythm, normal heart sounds and intact distal pulses.   Pulmonary/Chest: Effort normal and breath sounds normal.  Abdominal: Soft. Bowel sounds are normal. There is no tenderness.  Musculoskeletal: Normal range of motion. Exhibits no edema.  Lymphadenopathy:  Has no cervical adenopathy.  Neurological: Pt is alert and oriented to person, place, and time. Pt has normal reflexes. No cranial nerve deficit.  Skin: Skin is warm and dry. No rash noted.  Psychiatric:  Has  normal mood and affect. Behavior is normal.     Assessment & Plan:

## 2012-09-15 NOTE — Assessment & Plan Note (Signed)
stable overall by hx and exam, most recent data reviewed with pt, and pt to continue medical treatment as before Lab Results  Component Value Date   WBC 8.9 08/31/2011   HGB 13.5 08/31/2011   HCT 41.4 08/31/2011   PLT 329 08/31/2011   GLUCOSE 91 08/17/2011   ALT 20 08/17/2011   AST 22 08/17/2011   NA 139 08/17/2011   K 4.3 08/17/2011   CL 106 08/17/2011   CREATININE 0.89 08/17/2011   BUN 22 08/17/2011   CO2 24 08/17/2011   INR 0.85 08/31/2011

## 2012-09-15 NOTE — Assessment & Plan Note (Signed)
Stable off BP med with over 30 lbs wt loss after cancer surgury in the past yr

## 2012-09-15 NOTE — Assessment & Plan Note (Signed)
Mild to mod, for antibx course,  to f/u any worsening symptoms or concerns 

## 2012-09-15 NOTE — Patient Instructions (Addendum)
Take all new medications as prescribed - the antibiotic (at [facility]) Continue all other medications as before You are given the hardcopy refills of the xanax, and pain medication No blood work today Please keep your appointments with your specialists as you have planned - oncology next month with CT scan Please return in 6 months, or sooner if  needed

## 2012-09-15 NOTE — Assessment & Plan Note (Signed)
stable overall by hx and exam, most recent data reviewed with pt, and pt to continue medical treatment as before, cont pain med

## 2012-09-15 NOTE — Telephone Encounter (Signed)
Done hardcopy to robin  

## 2012-09-15 NOTE — Assessment & Plan Note (Signed)
Overall doing well, age appropriate education and counseling updated, referrals for preventative services and immunizations addressed, dietary and smoking counseling addressed, most recent labs and ECG reviewed.  I have personally reviewed and have noted: 1) the patient's medical and social history 2) The pt's use of alcohol, tobacco, and illicit drugs 3) The patient's current medications and supplements 4) Functional ability including ADL's, fall risk, home safety risk, hearing and visual impairment 5) Diet and physical activities 6) Evidence for depression or mood disorder 7) The patient's height, weight, and BMI have been recorded in the chart I have made referrals, and provided counseling and education based on review of the above Declines further labs today

## 2012-10-02 DIAGNOSIS — M259 Joint disorder, unspecified: Secondary | ICD-10-CM | POA: Insufficient documentation

## 2012-10-20 ENCOUNTER — Other Ambulatory Visit: Payer: Self-pay

## 2012-10-20 MED ORDER — OXYCODONE HCL 15 MG PO TABS: ORAL_TABLET | ORAL | Status: DC

## 2012-10-20 NOTE — Telephone Encounter (Signed)
Called the patient informed to pickup hardcopy at the front desk. 

## 2012-10-20 NOTE — Telephone Encounter (Signed)
Done hardcopy to robin  

## 2012-11-18 ENCOUNTER — Other Ambulatory Visit: Payer: Self-pay

## 2012-11-18 MED ORDER — OXYCODONE HCL 15 MG PO TABS: ORAL_TABLET | ORAL | Status: DC

## 2012-11-18 NOTE — Telephone Encounter (Signed)
Called the patient on home number as cell number not working.  Left detailed message that prescription is in cabnet at the front to pickup

## 2012-11-18 NOTE — Telephone Encounter (Signed)
Done hardcopy to robin  

## 2012-12-09 ENCOUNTER — Other Ambulatory Visit: Payer: Self-pay | Admitting: Internal Medicine

## 2012-12-10 NOTE — Telephone Encounter (Signed)
Done hardcopy to robin  

## 2012-12-10 NOTE — Telephone Encounter (Signed)
Faxed hardcopy to pharmacy. 

## 2012-12-15 ENCOUNTER — Other Ambulatory Visit: Payer: Self-pay

## 2012-12-15 MED ORDER — OXYCODONE HCL 15 MG PO TABS: ORAL_TABLET | ORAL | Status: DC

## 2012-12-15 NOTE — Telephone Encounter (Signed)
Done hardcopy to robin  

## 2012-12-16 NOTE — Telephone Encounter (Signed)
Called the patient left detailed message that prescription hardcopy is ready for pickup at the front desk.

## 2012-12-30 ENCOUNTER — Encounter: Payer: Self-pay | Admitting: Internal Medicine

## 2012-12-30 ENCOUNTER — Ambulatory Visit (INDEPENDENT_AMBULATORY_CARE_PROVIDER_SITE_OTHER): Payer: BC Managed Care – PPO | Admitting: Internal Medicine

## 2012-12-30 VITALS — BP 112/82 | HR 104 | Temp 97.0°F | Ht 75.0 in | Wt 199.2 lb

## 2012-12-30 DIAGNOSIS — IMO0002 Reserved for concepts with insufficient information to code with codable children: Secondary | ICD-10-CM | POA: Insufficient documentation

## 2012-12-30 DIAGNOSIS — L259 Unspecified contact dermatitis, unspecified cause: Secondary | ICD-10-CM

## 2012-12-30 DIAGNOSIS — R079 Chest pain, unspecified: Secondary | ICD-10-CM

## 2012-12-30 DIAGNOSIS — L309 Dermatitis, unspecified: Secondary | ICD-10-CM

## 2012-12-30 DIAGNOSIS — J209 Acute bronchitis, unspecified: Secondary | ICD-10-CM

## 2012-12-30 MED ORDER — SULFAMETHOXAZOLE-TRIMETHOPRIM 800-160 MG PO TABS: 1.0000 | ORAL_TABLET | Freq: Two times a day (BID) | ORAL | Status: DC

## 2012-12-30 MED ORDER — ZOLPIDEM TARTRATE ER 12.5 MG PO TBCR: 12.5000 mg | EXTENDED_RELEASE_TABLET | Freq: Every evening | ORAL | Status: DC | PRN

## 2012-12-30 MED ORDER — TRIAMCINOLONE ACETONIDE 0.1 % EX CREA: TOPICAL_CREAM | Freq: Two times a day (BID) | CUTANEOUS | Status: DC

## 2012-12-30 MED ORDER — OXYCODONE HCL 15 MG PO TABS: ORAL_TABLET | ORAL | Status: DC

## 2012-12-30 MED ORDER — HYDROCOD POLST-CHLORPHEN POLST 10-8 MG/5ML PO LQCR: 5.0000 mL | Freq: Two times a day (BID) | ORAL | Status: DC | PRN

## 2012-12-30 NOTE — Assessment & Plan Note (Signed)
Mild to mod, for antibx course,  to f/u any worsening symptoms or concerns 

## 2012-12-30 NOTE — Assessment & Plan Note (Signed)
Chronic persistent, for pain med refill,  to f/u any worsening symptoms or concerns

## 2012-12-30 NOTE — Assessment & Plan Note (Signed)
Dyshidrotic, for traim cr prn

## 2012-12-30 NOTE — Patient Instructions (Addendum)
Take all new medications as prescribed - the antibiotic (sent to the pharmacy), cough medicine, steroid cream, and the ambien CR Continue all other medications as before, including the pain medication refills OK to stop the "regular ambien" Please have the pharmacy call with any other refills you may need. Please keep your appointments with your specialists as you have planned - the CT scan, and oncology Thank you for enrolling in MyChart. Please follow the instructions below to securely access your online medical record. MyChart allows you to send messages to your doctor, view your test results, renew your prescriptions, schedule appointments, and more To Log into MyChart, please go to [email or web address], and your Username is:  [email or web address]

## 2012-12-30 NOTE — Progress Notes (Signed)
Subjective:    Patient ID: Evan [name], male    DOB: Nov 11, 1971, 42 y.o.   MRN: [id number]  HPI  Here with acute onset mild to mod 2-3 days ST, HA, general weakness and malaise, with prod cough greenish sputum, but Pt denies chest pain, increased sob or doe, wheezing, orthopnea, PND, increased LE swelling, palpitations, dizziness or syncope. Pt denies new neurological symptoms such as new headache, or facial or extremity weakness or numbness   Pt denies polydipsia, polyuria. Incidentally with right index finger with nail base with red/tender and slight pus driainage without red streaks or frank abscess.  Also with freq washing hands, dry in the winter and now itch/red rash diffuse as well. Chronic pain stable, asks for 3 mo supply to avoid coming into pick up rx so often.    Asks for ambien to [employer] cr change for better sleep Past Medical History  Diagnosis Date  . ANXIETY 12/10/2007  . BACK PAIN 08/03/2009  . ERECTILE DYSFUNCTION 12/10/2007  . HYPERTENSION 11/21/2007  . INSOMNIA-SLEEP DISORDER-UNSPEC 12/20/2008  . Preventative health care 05/14/2011  . Melanoma    Past Surgical History  Procedure Date  . Shoulder surgery     chronic L dislocating     reports that he has quit smoking. He does not have any smokeless tobacco history on file. He reports that he drinks alcohol. He reports that he does not use illicit drugs. family history includes Cancer in his father and other. Allergies  Allergen Reactions  . Other Swelling    seafood   Current Outpatient Prescriptions on File Prior to Visit  Medication Sig Dispense Refill  . ALPRAZolam (XANAX) 1 MG tablet 1 tab by mouth in the am, and 1-2 tab by mouth in the PM, to fill sept 29, 2013  90 tablet  2  . amphetamine-dextroamphetamine (ADDERALL) 30 MG tablet Take 1 tablet (30 mg total) by mouth 2 (two) times daily.  60 tablet  0  . Eszopiclone 3 MG TABS Take 1 tablet (3 mg total) by mouth at bedtime. Take immediately before bedtime  30  tablet  2  . VIAGRA 100 MG tablet TAKE ONE TABLET BY MOUTH ONCE A DAY AS  NEEDED  6 tablet  1   Review of Systems  Constitutional: Negative for diaphoresis and unexpected weight change.  HENT: Negative for tinnitus.   Eyes: Negative for photophobia and visual disturbance.  Respiratory: Negative for choking and stridor.   Gastrointestinal: Negative for vomiting and blood in stool.  Genitourinary: Negative for hematuria and decreased urine volume.  Musculoskeletal: Negative for gait problem.  Skin: Negative for color change and wound.  Neurological: Negative for tremors and numbness.  Psychiatric/Behavioral: Negative for decreased concentration. The patient is not hyperactive.       Objective:   Physical Exam BP 112/82  Pulse 104  Temp 97 F (36.1 C) (Oral)  Ht 6\' 3"  (1.905 m)  Wt 199 lb 4 oz (90.379 kg)  BMI 24.90 kg/m2  SpO2 97% Physical Exam  VS noted., mild ill Constitutional: Pt appears well-developed and well-nourished.  HENT: Head: Normocephalic.  Right Ear: External ear normal.  Left Ear: External ear normal.  Bilat tm's mild erythema.  Sinus nontender.  Pharynx mild erythema Eyes: Conjunctivae and EOM are normal. Pupils are equal, round, and reactive to light.  Neck: Normal range of motion. Neck supple.  Cardiovascular: Normal rate and regular rhythm.   Pulmonary/Chest: Effort normal and breath sounds normal.  Neurological: Pt is alert. Not confused  Skin: Skin is warm. No erythema. except diffuse hand eczma changes, and small area red/tender/no drainage at nail base right index finger Psychiatric: Pt behavior is normal. Thought content normal.     Assessment & Plan:

## 2013-02-07 ENCOUNTER — Other Ambulatory Visit: Payer: Self-pay

## 2013-02-19 ENCOUNTER — Telehealth: Payer: Self-pay

## 2013-02-19 MED ORDER — ZOLPIDEM TARTRATE 10 MG PO TABS: 10.0000 mg | ORAL_TABLET | Freq: Every evening | ORAL | Status: DC | PRN

## 2013-02-19 MED ORDER — ALPRAZOLAM 1 MG PO TABS: ORAL_TABLET | ORAL | Status: DC

## 2013-02-19 MED ORDER — AMPHETAMINE-DEXTROAMPHETAMINE 30 MG PO TABS: 30.0000 mg | ORAL_TABLET | Freq: Two times a day (BID) | ORAL | Status: DC

## 2013-02-19 NOTE — Telephone Encounter (Signed)
Patient informed to pickup hardcopy's at the front desk. 

## 2013-02-19 NOTE — Telephone Encounter (Signed)
The patient is requesting a refill on xanax, Adderall 30 mg #60 and (regular) Ambien 10 mg,  He stated the timed released Remus Loffler does not work well for him.  Call back number when ready is [phone]

## 2013-02-19 NOTE — Telephone Encounter (Signed)
Done hardcopy to robin  

## 2013-02-20 ENCOUNTER — Ambulatory Visit (HOSPITAL_COMMUNITY)
Admission: RE | Admit: 2013-02-20 | Discharge: 2013-02-20 | Disposition: A | Discharge status: Home / Self Care | Payer: BC Managed Care – PPO | Source: Ambulatory Visit | Attending: Internal Medicine | Admitting: Internal Medicine

## 2013-02-20 ENCOUNTER — Telehealth: Payer: Self-pay | Admitting: Internal Medicine

## 2013-02-20 ENCOUNTER — Telehealth: Payer: Self-pay

## 2013-02-20 ENCOUNTER — Ambulatory Visit (INDEPENDENT_AMBULATORY_CARE_PROVIDER_SITE_OTHER): Payer: BC Managed Care – PPO | Admitting: Internal Medicine

## 2013-02-20 ENCOUNTER — Encounter: Payer: Self-pay | Admitting: Internal Medicine

## 2013-02-20 VITALS — BP 140/90 | HR 84 | Temp 98.0°F | Ht 75.0 in | Wt 205.1 lb

## 2013-02-20 DIAGNOSIS — M7989 Other specified soft tissue disorders: Secondary | ICD-10-CM

## 2013-02-20 DIAGNOSIS — M79609 Pain in unspecified limb: Secondary | ICD-10-CM

## 2013-02-20 DIAGNOSIS — Z85820 Personal history of malignant melanoma of skin: Secondary | ICD-10-CM | POA: Insufficient documentation

## 2013-02-20 DIAGNOSIS — IMO0002 Reserved for concepts with insufficient information to code with codable children: Secondary | ICD-10-CM

## 2013-02-20 DIAGNOSIS — C439 Malignant melanoma of skin, unspecified: Secondary | ICD-10-CM

## 2013-02-20 DIAGNOSIS — C799 Secondary malignant neoplasm of unspecified site: Secondary | ICD-10-CM

## 2013-02-20 DIAGNOSIS — M79601 Pain in right arm: Secondary | ICD-10-CM

## 2013-02-20 DIAGNOSIS — L03011 Cellulitis of right finger: Secondary | ICD-10-CM

## 2013-02-20 DIAGNOSIS — C801 Malignant (primary) neoplasm, unspecified: Secondary | ICD-10-CM

## 2013-02-20 DIAGNOSIS — I10 Essential (primary) hypertension: Secondary | ICD-10-CM

## 2013-02-20 DIAGNOSIS — R609 Edema, unspecified: Secondary | ICD-10-CM | POA: Insufficient documentation

## 2013-02-20 MED ORDER — HYDROCOD POLST-CHLORPHEN POLST 10-8 MG/5ML PO LQCR: 5.0000 mL | Freq: Two times a day (BID) | ORAL | Status: DC | PRN

## 2013-02-20 MED ORDER — SULFAMETHOXAZOLE-TRIMETHOPRIM 800-160 MG PO TABS: 1.0000 | ORAL_TABLET | Freq: Two times a day (BID) | ORAL | Status: DC

## 2013-02-20 NOTE — Telephone Encounter (Signed)
Ok with me to fill

## 2013-02-20 NOTE — Patient Instructions (Signed)
Please take all new medication as prescribed - the tussionex for cough, and septra DS for antibiotic Please continue all other medications as before, including the oxycodone as previously prescribed Your prescriptions are also to be picked up at the front desk Please keep your appointments with your specialists as you have planned You will be contacted regarding the referral for: Right upper arm venous doppler (to make sure no blood clot) Thank you for enrolling in MyChart. Please follow the instructions below to securely access your online medical record. MyChart allows you to send messages to your doctor, view your test results, renew your prescriptions, schedule appointments, and more.

## 2013-02-20 NOTE — Telephone Encounter (Signed)
Called the pharmacy informed ok to fill new rx per MD ok.  Informed Psychologist, sport and exercise at [facility].  The patient has been notified as well.

## 2013-02-20 NOTE — Telephone Encounter (Signed)
[name] is calling about the patients Ambien RX, he brought in new script to be filled today but he just had the medication filled with an old RX 8 days ago, patient claims he lost the medication but the pharmacy can not fill this medication without approval from Evan [name], call pharmacy at [phone]

## 2013-02-20 NOTE — Telephone Encounter (Signed)
The patient picked up his [employer] yesterday.  He took to [facility] today to fill.  The pharmacy would not fill, they stated they filled the ambien 12.5 cr one week ago.  The patient states he does not have the bottle and may have lost it.  Please advise on refill of ambien 10 mg if ok need to call pharmacy and inform .

## 2013-02-20 NOTE — Progress Notes (Signed)
VASCULAR LAB PRELIMINARY  PRELIMINARY  PRELIMINARY  PRELIMINARY  Right upper extremity venous duplex completed.    Preliminary report:  Right upper extremity :  No evidence of DVT or superficial thrombosis.    Glenisha Gundry, RVS 02/20/2013, 2:29 PM

## 2013-02-20 NOTE — Progress Notes (Signed)
Subjective:    Patient ID: Evan [name], male    DOB: September 05, 1971, 42 y.o.   MRN: [id number]  HPI  Here to f/u primarily for pain control - Recent CT chest/abd/pelvis neg for recurrant melanoma last wk, tx for apparently subclinical pna - finished augmentin, Finished ipilumimab trial about 1 yr ago.  Has worsening right index finger paronychia acting up again after improved some with augmentin, but now back much more.  Also incidentally with Right upper arm discomfort, heat, erythema.  But actually state has LBP and bilat knee pain x 2-3 days worse but has been milder intermittent for about a yr, which is more concerning to him than the arm and finger today.  Bilat knees seemed red/swollen yesterday, much less today.  Worse to walk.  No radicaular pain, but seems to be singly to lower mid back, both knees and both ankles. Pt notes he mentioned the back/knee/ankle pain to the oncologist who said 7 others in the trial has have same symtpoms.  Gabapentin not working, diclofenac not helping, has also tried some ibuprofen.  2 oxycodone has helped more, states might need about once every 2 wk for flares. Asks for tussionex for cough post recent pna. Past Medical History  Diagnosis Date  . ANXIETY 12/10/2007  . BACK PAIN 08/03/2009  . ERECTILE DYSFUNCTION 12/10/2007  . HYPERTENSION 11/21/2007  . INSOMNIA-SLEEP DISORDER-UNSPEC 12/20/2008  . Preventative health care 05/14/2011  . Melanoma    Past Surgical History  Procedure Laterality Date  . Shoulder surgery      chronic L dislocating     reports that he has quit smoking. He does not have any smokeless tobacco history on file. He reports that  drinks alcohol. He reports that he does not use illicit drugs. family history includes Cancer in his father and other. Allergies  Allergen Reactions  . Other Swelling    seafood   Current Outpatient Prescriptions on File Prior to Visit  Medication Sig Dispense Refill  . ALPRAZolam (XANAX) 1 MG tablet 1 tab  by mouth in the am, and 1-2 tab by mouth in the PM,  90 tablet  2  . amphetamine-dextroamphetamine (ADDERALL) 30 MG tablet Take 1 tablet (30 mg total) by mouth 2 (two) times daily.  60 tablet  0  . chlorpheniramine-HYDROcodone (TUSSIONEX PENNKINETIC ER) 10-8 MG/5ML LQCR Take 5 mLs by mouth every 12 (twelve) hours as needed.  140 mL  1  . Eszopiclone 3 MG TABS Take 1 tablet (3 mg total) by mouth at bedtime. Take immediately before bedtime  30 tablet  2  . oxyCODONE (ROXICODONE) 15 MG immediate release tablet 1 tab by mouth every 4-6 hrs as needed for pain, with maximum 4 tabs per day -  To fill Mar 14, 2013  120 tablet  0  . sulfamethoxazole-trimethoprim (SEPTRA DS) 800-160 MG per tablet Take 1 tablet by mouth 2 (two) times daily.  14 tablet  0  . triamcinolone cream (KENALOG) 0.1 % Apply topically 2 (two) times daily.  30 g  1  . VIAGRA 100 MG tablet TAKE ONE TABLET BY MOUTH ONCE A DAY AS  NEEDED  6 tablet  1  . zolpidem (AMBIEN) 10 MG tablet Take 1 tablet (10 mg total) by mouth at bedtime as needed for sleep.  30 tablet  5   No current facility-administered medications on file prior to visit.   Review of Systems  Constitutional: Negative for unexpected weight change, or unusual diaphoresis  HENT: Negative for tinnitus.  Eyes: Negative for photophobia and visual disturbance.  Respiratory: Negative for choking and stridor.   Gastrointestinal: Negative for vomiting and blood in stool.  Genitourinary: Negative for hematuria and decreased urine volume.  Musculoskeletal: Negative for acute joint swelling Skin: Negative for color change and wound.  Neurological: Negative for tremors and numbness other than noted  Psychiatric/Behavioral: Negative for decreased concentration or  hyperactivity.       Objective:   Physical Exam BP 140/90  Pulse 84  Temp(Src) 98 F (36.7 C) (Oral)  Ht 6\' 3"  (1.905 m)  Wt 205 lb 2 oz (93.044 kg)  BMI 25.64 kg/m2  SpO2 98% VS noted,  Constitutional: Pt  appears well-developed and well-nourished.  HENT: Head: NCAT.  Right Ear: External ear normal.  Left Ear: External ear normal.  Eyes: Conjunctivae and EOM are normal. Pupils are equal, round, and reactive to light.  Neck: Normal range of motion. Neck supple.  Cardiovascular: Normal rate and regular rhythm.   Pulmonary/Chest: Effort normal and breath sounds normal.  Neurological: Pt is alert. Not confused , motor intact Skin: Skin has increased warmth/red/tender/indurated more than in past to upper humerus area only, though distal RUE without edema, not clearly with red streaks,  Does have paronychia to lateral first finger with red/tender Psychiatric: Pt behavior is normal. Thought content normal. 1-2+ nervous     Assessment & Plan:

## 2013-02-20 NOTE — Telephone Encounter (Signed)
Called the patient and pharmacist at [facility] informed of MD's ok to fill ambien.

## 2013-02-22 DIAGNOSIS — I890 Lymphedema, not elsewhere classified: Secondary | ICD-10-CM | POA: Insufficient documentation

## 2013-02-22 DIAGNOSIS — E8989 Other postprocedural endocrine and metabolic complications and disorders: Secondary | ICD-10-CM | POA: Insufficient documentation

## 2013-02-22 HISTORY — DX: Lymphedema, not elsewhere classified: I89.0

## 2013-02-22 HISTORY — DX: Lymphedema, not elsewhere classified: E89.89

## 2013-02-22 NOTE — Assessment & Plan Note (Signed)
Could be simply a worsening of his chronic RUE lymphedema but cant r/o DVT in light of exam and hx of melanoma, for RUE venous doppler

## 2013-02-22 NOTE — Assessment & Plan Note (Signed)
stable overall by history and exam, recent data reviewed with pt, and pt to continue medical treatment as before,  to f/u any worsening symptoms or concerns BP Readings from Last 3 Encounters:  02/20/13 140/90  12/30/12 112/82  09/15/12 140/88

## 2013-02-22 NOTE — Assessment & Plan Note (Signed)
Mild to mod, for antibx course,  to f/u any worsening symptoms or concerns 

## 2013-02-22 NOTE — Assessment & Plan Note (Signed)
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

## 2013-02-23 ENCOUNTER — Encounter (HOSPITAL_COMMUNITY): Payer: BC Managed Care – PPO

## 2013-03-16 ENCOUNTER — Other Ambulatory Visit: Payer: Self-pay | Admitting: Internal Medicine

## 2013-04-06 ENCOUNTER — Other Ambulatory Visit: Payer: Self-pay

## 2013-04-06 MED ORDER — AMPHETAMINE-DEXTROAMPHETAMINE 30 MG PO TABS: 30.0000 mg | ORAL_TABLET | Freq: Two times a day (BID) | ORAL | Status: DC

## 2013-04-06 MED ORDER — OXYCODONE HCL 15 MG PO TABS: ORAL_TABLET | ORAL | Status: DC

## 2013-04-06 NOTE — Telephone Encounter (Signed)
Called the patient informed to pickup hardcopy at the front desk. 

## 2013-04-06 NOTE — Telephone Encounter (Signed)
Done hardcopy to robin  

## 2013-05-08 ENCOUNTER — Telehealth: Payer: Self-pay | Admitting: *Deleted

## 2013-05-08 ENCOUNTER — Telehealth: Payer: Self-pay | Admitting: Internal Medicine

## 2013-05-08 MED ORDER — AMPHETAMINE-DEXTROAMPHETAMINE 30 MG PO TABS: 30.0000 mg | ORAL_TABLET | Freq: Two times a day (BID) | ORAL | Status: DC

## 2013-05-08 MED ORDER — OXYCODONE HCL 15 MG PO TABS: ORAL_TABLET | ORAL | Status: DC

## 2013-05-08 MED ORDER — ALPRAZOLAM 1 MG PO TABS: ORAL_TABLET | ORAL | Status: DC

## 2013-05-08 NOTE — Telephone Encounter (Signed)
I dont feel comfortable with this, since this has not been d/w pt and is a controlled substance  Please ask pt to request refill per psychiatry

## 2013-05-08 NOTE — Telephone Encounter (Signed)
Left msg on triage requesting refills on his oxycodone, adderal, and xanax. Pt also states he haven't seen his psychiatrist that fills his Adderal XR 20 mg capsule. Want to see can md fill as well...lmb

## 2013-05-08 NOTE — Telephone Encounter (Signed)
Notified pt rx's ready for pick-up../lmb 

## 2013-05-08 NOTE — Telephone Encounter (Signed)
All Done hardcopy to robin  

## 2013-05-08 NOTE — Telephone Encounter (Signed)
Pt was inform by jennifer with md response...lmb

## 2013-05-08 NOTE — Telephone Encounter (Signed)
Request refill on adderall  20mg  extended release, pt did pick up rx for adderall 30mg  but he is also taking one 20mg  in the am with 30mg , pt is waiting in the lobby

## 2013-05-11 ENCOUNTER — Encounter: Payer: Self-pay | Admitting: Internal Medicine

## 2013-05-11 ENCOUNTER — Ambulatory Visit (INDEPENDENT_AMBULATORY_CARE_PROVIDER_SITE_OTHER): Payer: BC Managed Care – PPO | Admitting: Internal Medicine

## 2013-05-11 VITALS — BP 148/82 | HR 85 | Temp 97.5°F | Wt 217.0 lb

## 2013-05-11 DIAGNOSIS — I10 Essential (primary) hypertension: Secondary | ICD-10-CM

## 2013-05-11 DIAGNOSIS — J069 Acute upper respiratory infection, unspecified: Secondary | ICD-10-CM | POA: Insufficient documentation

## 2013-05-11 DIAGNOSIS — F988 Other specified behavioral and emotional disorders with onset usually occurring in childhood and adolescence: Secondary | ICD-10-CM

## 2013-05-11 HISTORY — DX: Other specified behavioral and emotional disorders with onset usually occurring in childhood and adolescence: F98.8

## 2013-05-11 MED ORDER — AMPHETAMINE-DEXTROAMPHET ER 20 MG PO CP24: 20.0000 mg | ORAL_CAPSULE | ORAL | Status: DC

## 2013-05-11 MED ORDER — HYDROCODONE-HOMATROPINE 5-1.5 MG/5ML PO SYRP: 5.0000 mL | ORAL_SOLUTION | Freq: Four times a day (QID) | ORAL | Status: DC | PRN

## 2013-05-11 MED ORDER — AZITHROMYCIN 250 MG PO TABS: ORAL_TABLET | ORAL | Status: DC

## 2013-05-11 MED ORDER — HYDROCOD POLST-CHLORPHEN POLST 10-8 MG/5ML PO LQCR: 5.0000 mL | Freq: Two times a day (BID) | ORAL | Status: DC | PRN

## 2013-05-11 NOTE — Assessment & Plan Note (Signed)
Stable, for adderall refill 

## 2013-05-11 NOTE — Assessment & Plan Note (Signed)
stable overall by history and exam, recent data reviewed with pt, and pt to continue medical treatment as before,  to f/u any worsening symptoms or concerns BP Readings from Last 3 Encounters:  05/11/13 148/82  02/20/13 140/90  12/30/12 112/82

## 2013-05-11 NOTE — Assessment & Plan Note (Signed)
Mild to mod, for antibx course,  to f/u any worsening symptoms or concerns 

## 2013-05-11 NOTE — Patient Instructions (Addendum)
Please take all new medication as prescribed Please continue all other medications as before Please have the pharmacy call with any other refills you may need. Please keep your appointments with your specialists as you have planned

## 2013-05-11 NOTE — Progress Notes (Signed)
Subjective:    Patient ID: Evan [name], male    DOB: 1971-03-30, 42 y.o.   MRN: [id number]  HPI  Here to f/u;  Mentions some variable testosterone checks with a low of 30 but another time was more normal.  Has been treated with testosterone injection, but then later improved so no further tx.  Pt states had MRI with hypophysitis, tx transiently with synthroid, now off synthroid as well, all after taking Ipilumimab for the melanoma (investigational drug; [facility]).  Still with burning pain to the feet he attributes to the drug, and still persistent lymphedema to RUE after axillary surgury, has seen lymphedema educator/therapist. Usually wears the arm sleeve for this.  Trying to avoid salt.  Has midl cough and congestion today, no fever, ST but has darker phlegm, no blood. Pt denies chest pain, increased sob or doe, wheezing, orthopnea, PND, increased LE swelling, palpitations, dizziness or syncope.   Pt denies polydipsia, polyuria. Asks for adderall refill, takes somewhat complicated regimen for 4 yrs, has been seeing Dr Evelene Croon but too expensive  Past Medical History  Diagnosis Date  . ANXIETY 12/10/2007  . BACK PAIN 08/03/2009  . ERECTILE DYSFUNCTION 12/10/2007  . HYPERTENSION 11/21/2007  . INSOMNIA-SLEEP DISORDER-UNSPEC 12/20/2008  . Preventative health care 05/14/2011  . Melanoma   . ADD (attention deficit disorder) 05/11/2013   Past Surgical History  Procedure Laterality Date  . Shoulder surgery      chronic L dislocating     reports that he has quit smoking. He does not have any smokeless tobacco history on file. He reports that  drinks alcohol. He reports that he does not use illicit drugs. family history includes Cancer in his father and other. Allergies  Allergen Reactions  . Other Swelling    seafood   Current Outpatient Prescriptions on File Prior to Visit  Medication Sig Dispense Refill  . ALPRAZolam (XANAX) 1 MG tablet 1 tab by mouth in the am, and 1-2 tab by mouth in the PM,  90  tablet  2  . amphetamine-dextroamphetamine (ADDERALL) 30 MG tablet Take 1 tablet (30 mg total) by mouth 2 (two) times daily.  60 tablet  0  . chlorpheniramine-HYDROcodone (TUSSIONEX PENNKINETIC ER) 10-8 MG/5ML LQCR Take 5 mLs by mouth every 12 (twelve) hours as needed.  140 mL  1  . oxyCODONE (ROXICODONE) 15 MG immediate release tablet 1 tab by mouth every 4-6 hrs as needed for pain, with maximum 4 tabs per day -  To fill Mar 14, 2013  120 tablet  0  . sulfamethoxazole-trimethoprim (SEPTRA DS) 800-160 MG per tablet Take 1 tablet by mouth 2 (two) times daily.  28 tablet  0  . VIAGRA 100 MG tablet TAKE ONE TABLET BY MOUTH ONCE A DAY AS NEEDED  6 tablet  2   No current facility-administered medications on file prior to visit.    Review of Systems  Constitutional: Negative for unexpected weight change, or unusual diaphoresis  HENT: Negative for tinnitus.   Eyes: Negative for photophobia and visual disturbance.  Respiratory: Negative for choking and stridor.   Gastrointestinal: Negative for vomiting and blood in stool.  Genitourinary: Negative for hematuria and decreased urine volume.  Musculoskeletal: Negative for acute joint swelling Skin: Negative for color change and wound.  Neurological: Negative for tremors and numbness other than noted  Psychiatric/Behavioral: Negative for decreased concentration or  hyperactivity.       Objective:   Physical Exam BP 148/82  Pulse 85  Temp(Src) 97.5 F (36.4  C) (Oral)  Wt 217 lb (98.431 kg)  BMI 27.12 kg/m2  SpO2 96% VS noted, mild ill Constitutional: Pt appears well-developed and well-nourished.  HENT: Head: NCAT.  Right Ear: External ear normal.  Left Ear: External ear normal.  Bilat tm's with mild erythema.  Max sinus areas non tender.  Pharynx with mild erythema, no exudate Eyes: Conjunctivae and EOM are normal. Pupils are equal, round, and reactive to light.  Neck: Normal range of motion. Neck supple.  Cardiovascular: Normal rate and  regular rhythm.   Pulmonary/Chest: Effort normal and breath sounds normal. - no rales or wheezing Right UE lymphedema less today, no erythema Neurological: Pt is alert. Not confused  Skin: Skin is warm. No erythema.  Psychiatric: Pt behavior is normal. Thought content normal.     Assessment & Plan:

## 2013-05-14 DIAGNOSIS — K730 Chronic persistent hepatitis, not elsewhere classified: Secondary | ICD-10-CM | POA: Insufficient documentation

## 2013-05-15 ENCOUNTER — Encounter: Payer: Self-pay | Admitting: Internal Medicine

## 2013-05-15 DIAGNOSIS — B1920 Unspecified viral hepatitis C without hepatic coma: Secondary | ICD-10-CM

## 2013-05-15 HISTORY — DX: Unspecified viral hepatitis C without hepatic coma: B19.20

## 2013-06-05 ENCOUNTER — Telehealth: Payer: Self-pay

## 2013-06-05 MED ORDER — AMPHETAMINE-DEXTROAMPHETAMINE 30 MG PO TABS: 30.0000 mg | ORAL_TABLET | Freq: Two times a day (BID) | ORAL | Status: DC

## 2013-06-05 MED ORDER — OXYCODONE HCL 15 MG PO TABS: ORAL_TABLET | ORAL | Status: DC

## 2013-06-05 MED ORDER — AMPHETAMINE-DEXTROAMPHET ER 20 MG PO CP24: 20.0000 mg | ORAL_CAPSULE | ORAL | Status: DC

## 2013-06-05 NOTE — Telephone Encounter (Signed)
Done hardcopy to robin  

## 2013-06-05 NOTE — Telephone Encounter (Signed)
Patient informed to pickup hardcopy at the front desk. 

## 2013-06-05 NOTE — Telephone Encounter (Signed)
Pt called [facility] requesting refills for Oxy 15, Adderral 25mg , and Adderral 30 mg. Thanks

## 2013-07-02 ENCOUNTER — Other Ambulatory Visit: Payer: Self-pay

## 2013-07-02 MED ORDER — AMPHETAMINE-DEXTROAMPHET ER 20 MG PO CP24: 20.0000 mg | ORAL_CAPSULE | ORAL | Status: DC

## 2013-07-02 MED ORDER — AMPHETAMINE-DEXTROAMPHETAMINE 30 MG PO TABS: 30.0000 mg | ORAL_TABLET | Freq: Two times a day (BID) | ORAL | Status: DC

## 2013-07-02 MED ORDER — OXYCODONE HCL 15 MG PO TABS: ORAL_TABLET | ORAL | Status: DC

## 2013-07-02 NOTE — Telephone Encounter (Signed)
Called the patient informed to pickup hardcopy at the front desk. 

## 2013-07-02 NOTE — Telephone Encounter (Signed)
Done hardcopy to robin  

## 2013-07-29 ENCOUNTER — Other Ambulatory Visit: Payer: Self-pay | Admitting: Dermatology

## 2013-07-30 ENCOUNTER — Telehealth: Payer: Self-pay | Admitting: *Deleted

## 2013-07-30 MED ORDER — AMPHETAMINE-DEXTROAMPHET ER 20 MG PO CP24: 20.0000 mg | ORAL_CAPSULE | ORAL | Status: DC

## 2013-07-30 MED ORDER — AMPHETAMINE-DEXTROAMPHETAMINE 30 MG PO TABS: 30.0000 mg | ORAL_TABLET | Freq: Two times a day (BID) | ORAL | Status: DC

## 2013-07-30 MED ORDER — ALPRAZOLAM 1 MG PO TABS: ORAL_TABLET | ORAL | Status: DC

## 2013-07-30 MED ORDER — OXYCODONE HCL 15 MG PO TABS: ORAL_TABLET | ORAL | Status: DC

## 2013-07-30 NOTE — Telephone Encounter (Signed)
Done hardcopy to robin  

## 2013-07-30 NOTE — Telephone Encounter (Signed)
Called the patient informed to pickup hardcopy's of prescriptions at the front desk.

## 2013-07-30 NOTE — Telephone Encounter (Signed)
Pt called requesting refills on Adderall Xr, Adderall, Oxycodone and Xanax.  Please advise

## 2013-09-01 ENCOUNTER — Telehealth: Payer: Self-pay | Admitting: *Deleted

## 2013-09-01 MED ORDER — OXYCODONE HCL 15 MG PO TABS: ORAL_TABLET | ORAL | Status: DC

## 2013-09-01 MED ORDER — AMPHETAMINE-DEXTROAMPHET ER 20 MG PO CP24: 20.0000 mg | ORAL_CAPSULE | ORAL | Status: DC

## 2013-09-01 MED ORDER — ZOLPIDEM TARTRATE 10 MG PO TABS: 10.0000 mg | ORAL_TABLET | Freq: Every evening | ORAL | Status: DC | PRN

## 2013-09-01 MED ORDER — AMPHETAMINE-DEXTROAMPHETAMINE 30 MG PO TABS: 30.0000 mg | ORAL_TABLET | Freq: Two times a day (BID) | ORAL | Status: DC

## 2013-09-01 NOTE — Telephone Encounter (Signed)
Done hardcopy to robin  

## 2013-09-01 NOTE — Telephone Encounter (Signed)
Called the patient informed to pickup hardcopy at the front desk. 

## 2013-09-01 NOTE — Telephone Encounter (Signed)
Pt called requesting refills on Adderall 30mg , Adderall ER, Oxycodone and Ambien.  Last OV 5.19.14.  Please advise

## 2013-09-30 ENCOUNTER — Other Ambulatory Visit: Payer: Self-pay

## 2013-09-30 MED ORDER — OXYCODONE HCL 15 MG PO TABS: ORAL_TABLET | ORAL | Status: DC

## 2013-09-30 MED ORDER — AMPHETAMINE-DEXTROAMPHET ER 20 MG PO CP24: 20.0000 mg | ORAL_CAPSULE | ORAL | Status: DC

## 2013-09-30 MED ORDER — AMPHETAMINE-DEXTROAMPHETAMINE 30 MG PO TABS: 30.0000 mg | ORAL_TABLET | Freq: Two times a day (BID) | ORAL | Status: DC

## 2013-09-30 NOTE — Telephone Encounter (Signed)
Done hardcopy to robin  

## 2013-09-30 NOTE — Telephone Encounter (Signed)
Called the patient informed to pickup hardcopy's at the front desk at his convenience.

## 2013-10-29 ENCOUNTER — Telehealth: Payer: Self-pay | Admitting: Internal Medicine

## 2013-10-29 ENCOUNTER — Other Ambulatory Visit: Payer: Self-pay

## 2013-10-29 MED ORDER — AMPHETAMINE-DEXTROAMPHETAMINE 30 MG PO TABS: 30.0000 mg | ORAL_TABLET | Freq: Two times a day (BID) | ORAL | Status: DC

## 2013-10-29 MED ORDER — AMPHETAMINE-DEXTROAMPHET ER 20 MG PO CP24: 20.0000 mg | ORAL_CAPSULE | ORAL | Status: DC

## 2013-10-29 MED ORDER — OXYCODONE HCL 15 MG PO TABS: ORAL_TABLET | ORAL | Status: DC

## 2013-10-29 NOTE — Telephone Encounter (Signed)
Done hardcopy to robin but to let pt know:  You are given the letter today explaining the transitional pain medication refill policy due to change in [place] law  Please be aware that I will no longer be able to offer monthly refills of any Schedule II or higher medication starting Jan 24, 2014

## 2013-10-29 NOTE — Telephone Encounter (Signed)
10/29/2013  Pt wanted to leave message for Robin.  Pt needs refill on the following RX's:  amphetamine-dextroamphetamine (ADDERALL XR) 20 MG 24 hr capsule, amphetamine-dextroamphetamine (ADDERALL) 30 MG tablet, and oxyCODONE (ROXICODONE) 15 MG immediate release.

## 2013-10-30 ENCOUNTER — Other Ambulatory Visit: Payer: Self-pay | Admitting: Internal Medicine

## 2013-10-30 NOTE — Telephone Encounter (Signed)
Called the patient informed that hardcopy's (3 months worth) of both strengths of adderall's and oxycodone is ready for pickup.  Also informed of letter attached and explained content of letter as well.  The patient did understand all information and will pickup hardcopy's today.  The patient will schedule appointment with PCP to discuss options for pain medication future refills.

## 2013-10-30 NOTE — Telephone Encounter (Signed)
Faxed hard copy to [facility].  

## 2013-10-30 NOTE — Telephone Encounter (Signed)
Called left message to call back 

## 2013-11-04 ENCOUNTER — Ambulatory Visit: Payer: BC Managed Care – PPO | Admitting: Internal Medicine

## 2013-12-01 ENCOUNTER — Telehealth: Payer: Self-pay
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
## 2013-12-01 NOTE — Telephone Encounter (Signed)
Called left message to call back 

## 2013-12-02 NOTE — Telephone Encounter (Signed)
Patient called back to question if PCP required a police report.  Called back left a detailed message that "yes" a police report is required per MD.

## 2013-12-04 ENCOUNTER — Other Ambulatory Visit: Payer: Self-pay | Admitting: Internal Medicine

## 2013-12-04 ENCOUNTER — Ambulatory Visit: Payer: BC Managed Care – PPO | Admitting: Internal Medicine

## 2013-12-04 ENCOUNTER — Telehealth: Payer: Self-pay | Admitting: Internal Medicine

## 2013-12-04 DIAGNOSIS — Z0289 Encounter for other administrative examinations: Secondary | ICD-10-CM

## 2013-12-04 MED ORDER — AMPHETAMINE-DEXTROAMPHET ER 20 MG PO CP24: 20.0000 mg | ORAL_CAPSULE | ORAL | Status: DC

## 2013-12-04 MED ORDER — AMPHETAMINE-DEXTROAMPHETAMINE 30 MG PO TABS: 30.0000 mg | ORAL_TABLET | Freq: Two times a day (BID) | ORAL | Status: DC

## 2013-12-04 MED ORDER — OXYCODONE HCL 15 MG PO TABS: ORAL_TABLET | ORAL | Status: DC

## 2013-12-04 MED ORDER — ALPRAZOLAM 1 MG PO TABS: ORAL_TABLET | ORAL | Status: DC
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
## 2013-12-04 NOTE — Telephone Encounter (Signed)
Done hardcopy to robin - the adderall as well

## 2013-12-04 NOTE — Telephone Encounter (Signed)
Done hardcopy to robin  

## 2013-12-04 NOTE — Telephone Encounter (Signed)
Patient was given hardcopy's to take to the pharmacy.  Pharmacy informed as well

## 2013-12-24 DIAGNOSIS — C439 Malignant melanoma of skin, unspecified: Secondary | ICD-10-CM

## 2013-12-24 HISTORY — DX: Malignant melanoma of skin, unspecified: C43.9

## 2013-12-31 ENCOUNTER — Telehealth: Payer: Self-pay | Admitting: *Deleted

## 2013-12-31 MED ORDER — ZOLPIDEM TARTRATE ER 12.5 MG PO TBCR: 12.5000 mg | EXTENDED_RELEASE_TABLET | Freq: Every evening | ORAL | Status: DC | PRN

## 2013-12-31 MED ORDER — HYDROCOD POLST-CHLORPHEN POLST 10-8 MG/5ML PO LQCR: 5.0000 mL | Freq: Two times a day (BID) | ORAL | Status: DC | PRN

## 2013-12-31 NOTE — Telephone Encounter (Signed)
Patient phoned requesting tussionex syrup and ambien cr...states the regular ambien isn't as effective.  Please advise.  CB# [phone]

## 2013-12-31 NOTE — Telephone Encounter (Signed)
Done hardcopy to robin/staff 

## 2014-01-01 NOTE — Telephone Encounter (Signed)
Left message for pt that scripts were ready to be picked up.

## 2014-01-06 ENCOUNTER — Ambulatory Visit: Payer: BC Managed Care – PPO | Admitting: Internal Medicine

## 2014-01-06 DIAGNOSIS — Z0289 Encounter for other administrative examinations: Secondary | ICD-10-CM

## 2014-01-28 ENCOUNTER — Ambulatory Visit (INDEPENDENT_AMBULATORY_CARE_PROVIDER_SITE_OTHER): Payer: BC Managed Care – PPO | Admitting: Internal Medicine

## 2014-01-28 ENCOUNTER — Encounter: Payer: Self-pay | Admitting: Internal Medicine

## 2014-01-28 VITALS — BP 140/90 | HR 101 | Temp 98.1°F | Ht 75.0 in | Wt 239.0 lb

## 2014-01-28 DIAGNOSIS — I10 Essential (primary) hypertension: Secondary | ICD-10-CM

## 2014-01-28 DIAGNOSIS — F988 Other specified behavioral and emotional disorders with onset usually occurring in childhood and adolescence: Secondary | ICD-10-CM

## 2014-01-28 DIAGNOSIS — G894 Chronic pain syndrome: Secondary | ICD-10-CM

## 2014-01-28 DIAGNOSIS — J209 Acute bronchitis, unspecified: Secondary | ICD-10-CM | POA: Insufficient documentation

## 2014-01-28 HISTORY — DX: Chronic pain syndrome: G89.4

## 2014-01-28 MED ORDER — LEVOFLOXACIN 250 MG PO TABS: 250.0000 mg | ORAL_TABLET | Freq: Every day | ORAL | Status: DC

## 2014-01-28 MED ORDER — OXYCODONE HCL 15 MG PO TABS: ORAL_TABLET | ORAL | Status: DC

## 2014-01-28 MED ORDER — AMPHETAMINE-DEXTROAMPHET ER 20 MG PO CP24: 20.0000 mg | ORAL_CAPSULE | ORAL | Status: DC

## 2014-01-28 MED ORDER — ALPRAZOLAM 1 MG PO TABS: ORAL_TABLET | ORAL | Status: DC

## 2014-01-28 MED ORDER — AMPHETAMINE-DEXTROAMPHETAMINE 30 MG PO TABS: 30.0000 mg | ORAL_TABLET | Freq: Two times a day (BID) | ORAL | Status: DC

## 2014-01-28 NOTE — Progress Notes (Signed)
Pre-visit discussion using our clinic review tool. No additional management support is needed unless otherwise documented below in the visit note.  

## 2014-01-28 NOTE — Progress Notes (Signed)
Subjective:    Patient ID: Evan [name], male    DOB: 12-29-1970, 43 y.o.   MRN: [id number]  HPI  Here with acute onset mild to mod 2-3 days ST, HA, general weakness and malaise, with prod cough greenish sputum, but Pt denies chest pain, increased sob or doe, wheezing, orthopnea, PND, increased LE swelling, palpitations, dizziness or syncope.  Chronic pain stable, needs refills, and pain management referral.  Denies worsening depressive symptoms, suicidal ideation, or panic; has ongoing anxiety, not increased recently.   ADD med working ok, able to function successfully at work and social Past Medical History  Diagnosis Date  . ANXIETY 12/10/2007  . BACK PAIN 08/03/2009  . ERECTILE DYSFUNCTION 12/10/2007  . HYPERTENSION 11/21/2007  . INSOMNIA-SLEEP DISORDER-UNSPEC 12/20/2008  . Preventative health care 05/14/2011  . Melanoma   . ADD (attention deficit disorder) 05/11/2013  . 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 Shoulder surgery      chronic L dislocating     reports that he has quit smoking. He does not have any smokeless tobacco history on file. He reports that he drinks alcohol. He reports that he does not use illicit drugs. family history includes Cancer in his father and other. Allergies  Allergen Reactions  . Other Swelling    seafood   Current Outpatient Prescriptions on File Prior to Visit  Medication Sig Dispense Refill  . chlorpheniramine-HYDROcodone (TUSSIONEX PENNKINETIC ER) 10-8 MG/5ML LQCR Take 5 mLs by mouth every 12 (twelve) hours as needed.  140 mL  0  . sulfamethoxazole-trimethoprim (SEPTRA DS) 800-160 MG per tablet Take 1 tablet by mouth 2 (two) times daily.  28 tablet  0  . VIAGRA 100 MG tablet TAKE ONE TABLET BY MOUTH ONCE A DAY AS NEEDED  6 tablet  2  . zolpidem (AMBIEN CR) 12.5 MG CR tablet Take 1 tablet  (12.5 mg total) by mouth at bedtime as needed for sleep.  30 tablet  5   No current facility-administered medications on file prior to visit.   Review of Systems  Constitutional: Negative for unexpected weight change, or unusual diaphoresis  HENT: Negative for tinnitus.   Eyes: Negative for photophobia and visual disturbance.  Respiratory: Negative for choking and stridor.   Gastrointestinal: Negative for vomiting and blood in stool.  Genitourinary: Negative for hematuria and decreased urine volume.  Musculoskeletal: Negative for acute joint swelling Skin: Negative for color change and wound.  Neurological: Negative for tremors and numbness other than noted  Psychiatric/Behavioral: Negative for decreased concentration or  hyperactivity.       Objective:   Physical Exam BP 140/90  Pulse 101  Temp(Src) 98.1 F (36.7 C) (Oral)  Ht 6\' 3"  (1.905 m)  Wt 239 lb (108.41 kg)  BMI 29.87 kg/m2  SpO2 99% VS noted, mild ill Constitutional: Pt appears well-developed and well-nourished.  HENT: Head: NCAT.  Right Ear: External ear normal.  Left Ear: External ear normal.  Bilat tm's with mild erythema.  Max sinus areas non tender.  Pharynx with mild erythema, no exudate Eyes: Conjunctivae and EOM are normal. Pupils are equal, round, and reactive to light.  Neck: Normal range of motion. Neck supple.  Cardiovascular: Normal rate and regular rhythm.   Pulmonary/Chest: Effort normal and breath sounds normal.  Neurological: Pt is alert. Not confused  Skin: Skin is warm. No  erythema.  Psychiatric: Pt behavior is normal. Thought content normal.     Assessment & Plan:

## 2014-01-28 NOTE — Assessment & Plan Note (Signed)
Stable, for pain med referral, and refer pain management

## 2014-01-28 NOTE — Assessment & Plan Note (Signed)
stable overall by history and exam, recent data reviewed with pt, and pt to continue medical treatment as before,  to f/u any worsening symptoms or concerns BP Readings from Last 3 Encounters:  01/28/14 140/90  05/11/13 148/82  02/20/13 140/90

## 2014-01-28 NOTE — Assessment & Plan Note (Signed)
Stable, for med refill 

## 2014-01-28 NOTE — Patient Instructions (Addendum)
Please take all new medication as prescribed  - the antibiotic  OK to continue your cough medicine as you have  Please continue all other medications as before, and refills have been done if requested - the pain medication  You are given the letter today explaining the transitional pain medication refill policy  Please be aware that I will no longer be able to offer monthly refills of any Schedule II or higher medication starting after today  Please let me know if you wish for me to refer to Pain Management  You can also take Mucinex (or it's generic off brand) for congestion, and tylenol as needed for pain.

## 2014-01-28 NOTE — Assessment & Plan Note (Signed)
Mild to mod, for antibx course,  to f/u any worsening symptoms or concerns 

## 2014-02-25 ENCOUNTER — Encounter: Payer: Self-pay | Admitting: Internal Medicine

## 2014-03-05 ENCOUNTER — Other Ambulatory Visit: Payer: Self-pay

## 2014-03-05 MED ORDER — AMPHETAMINE-DEXTROAMPHET ER 20 MG PO CP24: 20.0000 mg | ORAL_CAPSULE | ORAL | Status: DC

## 2014-03-05 MED ORDER — AMPHETAMINE-DEXTROAMPHETAMINE 30 MG PO TABS: 30.0000 mg | ORAL_TABLET | Freq: Two times a day (BID) | ORAL | Status: DC

## 2014-03-05 NOTE — Telephone Encounter (Signed)
Done hardcopy to robin  

## 2014-03-09 NOTE — Telephone Encounter (Signed)
Called the patient informed to pickup hardcopy's at the front desk. 

## 2014-03-09 NOTE — Telephone Encounter (Signed)
I do not have hardcopy's

## 2014-04-06 ENCOUNTER — Other Ambulatory Visit: Payer: Self-pay | Admitting: Internal Medicine

## 2014-04-06 NOTE — Telephone Encounter (Signed)
Done hardcopy to robin  

## 2014-04-07 ENCOUNTER — Telehealth: Payer: Self-pay

## 2014-04-07 MED ORDER — AMPHETAMINE-DEXTROAMPHETAMINE 30 MG PO TABS: 30.0000 mg | ORAL_TABLET | Freq: Two times a day (BID) | ORAL | Status: DC

## 2014-04-07 MED ORDER — AMPHETAMINE-DEXTROAMPHET ER 20 MG PO CP24: 20.0000 mg | ORAL_CAPSULE | ORAL | Status: DC

## 2014-04-07 NOTE — Telephone Encounter (Signed)
The patient is hoping to get refills on both of his adderrall prescriptions.  He is also hoping to get his oxycodone medicine refilled.  He understands Dr.John is not writing pain meds, but he is hoping an exception can be made so he does not stop this medicine abruptly.  He is currently seeking a pain management clinic to take on this need.    Callback - [phone]

## 2014-04-07 NOTE — Telephone Encounter (Signed)
Faxed hard copy to [facility].  

## 2014-04-07 NOTE — Telephone Encounter (Signed)
[place] for ADD meds, very sorry, I no longer practice chronic pain management so am unable to provide ongoing oxycodone rx's

## 2014-04-08 NOTE — Telephone Encounter (Signed)
Called the patient, no answer and unable to leave a message as mailbox full.

## 2014-04-08 NOTE — Telephone Encounter (Signed)
Called the patient no answer, mail box full unable to leave a msg.

## 2014-04-08 NOTE — Telephone Encounter (Signed)
Called the patient no answer and mailbox was full.  Unable to leave message.  Attempting to contact to pickup hardcopy, but unable to.   Prescription is on CMA's desk if he does call back.

## 2014-05-12 ENCOUNTER — Telehealth: Payer: Self-pay | Admitting: *Deleted

## 2014-05-12 MED ORDER — AMPHETAMINE-DEXTROAMPHET ER 20 MG PO CP24: 20.0000 mg | ORAL_CAPSULE | ORAL | Status: DC

## 2014-05-12 MED ORDER — AMPHETAMINE-DEXTROAMPHETAMINE 30 MG PO TABS: 30.0000 mg | ORAL_TABLET | Freq: Two times a day (BID) | ORAL | Status: DC

## 2014-05-12 NOTE — Telephone Encounter (Signed)
Pt called requesting Adderall refill, pt is requesting both strengths.  Please advise

## 2014-05-12 NOTE — Telephone Encounter (Signed)
Done hardcopy to robin - 1 mo each

## 2014-05-13 NOTE — Telephone Encounter (Signed)
Called the patient left detailed message hardcopy's are ready for pickup at the front desk.

## 2014-06-09 ENCOUNTER — Telehealth: Payer: Self-pay

## 2014-06-09 NOTE — Telephone Encounter (Signed)
Pt called to have both adderall rx refilled. I informed the pt that he would need to schedule an appt w/ dr. Jenny Reichmann due to the fact that he hasn't been seen since 01/28/2014.  Pt was transferred to schedule appt w/ adero team lead

## 2014-06-15 ENCOUNTER — Ambulatory Visit (INDEPENDENT_AMBULATORY_CARE_PROVIDER_SITE_OTHER): Payer: BC Managed Care – PPO | Admitting: Internal Medicine

## 2014-06-15 ENCOUNTER — Encounter: Payer: Self-pay | Admitting: Internal Medicine

## 2014-06-15 VITALS — BP 140/84 | HR 106 | Temp 98.4°F | Ht 75.0 in | Wt 217.4 lb

## 2014-06-15 DIAGNOSIS — F329 Major depressive disorder, single episode, unspecified: Secondary | ICD-10-CM | POA: Insufficient documentation

## 2014-06-15 DIAGNOSIS — F32A Depression, unspecified: Secondary | ICD-10-CM

## 2014-06-15 DIAGNOSIS — F3289 Other specified depressive episodes: Secondary | ICD-10-CM

## 2014-06-15 DIAGNOSIS — Z0000 Encounter for general adult medical examination without abnormal findings: Secondary | ICD-10-CM

## 2014-06-15 HISTORY — DX: Depression, unspecified: F32.A

## 2014-06-15 MED ORDER — ALPRAZOLAM 1 MG PO TABS: ORAL_TABLET | ORAL | Status: DC

## 2014-06-15 MED ORDER — AMPHETAMINE-DEXTROAMPHETAMINE 30 MG PO TABS: 30.0000 mg | ORAL_TABLET | Freq: Two times a day (BID) | ORAL | Status: DC

## 2014-06-15 MED ORDER — ESCITALOPRAM OXALATE 10 MG PO TABS: 10.0000 mg | ORAL_TABLET | Freq: Every day | ORAL | Status: DC

## 2014-06-15 MED ORDER — AMPHETAMINE-DEXTROAMPHET ER 20 MG PO CP24: 20.0000 mg | ORAL_CAPSULE | ORAL | Status: DC

## 2014-06-15 NOTE — Assessment & Plan Note (Signed)
To start lexapro 10 qd,  to f/u any worsening symptoms or concerns, consider counseling

## 2014-06-15 NOTE — Progress Notes (Signed)
Pre visit review using our clinic review tool, if applicable. No additional management support is needed unless otherwise documented below in the visit note. 

## 2014-06-15 NOTE — Assessment & Plan Note (Signed)

## 2014-06-15 NOTE — Patient Instructions (Addendum)
Please take all new medication as prescribed - the lexapro 10 mg per day  Please continue all other medications as before, and refills have been done if requested - the adderall, and xanax  Please have the pharmacy call with any other refills you may need.  Please continue your efforts at being more active, low cholesterol diet, and weight control.  You are otherwise up to date with prevention measures today.  Please keep your appointments with your specialists as you may have planned  Please go to the LAB in the Basement (turn left off the elevator) for the tests to be done today  You will be contacted by phone if any changes need to be made immediately.  Otherwise, you will receive a letter about your results with an explanation, but please check with MyChart first.  Please remember to sign up for MyChart if you have not done so, as this will be important to you in the future with finding out test results, communicating by private email, and scheduling acute appointments online when needed.  Please return in 6 months, or sooner if needed

## 2014-06-15 NOTE — Progress Notes (Signed)
Subjective:    Patient ID: Evan [name], male    DOB: 23-Jul-1971, 43 y.o.   MRN: [id number]  HPI  Here for wellness and f/u;  Overall doing ok;  Pt denies CP, worsening SOB, DOE, wheezing, orthopnea, PND, worsening LE edema, palpitations, dizziness or syncope.  Pt denies neurological change such as new headache, facial or extremity weakness.  Pt denies polydipsia, polyuria, or low sugar symptoms. Pt states overall good compliance with treatment and medications, good tolerability, and has been trying to follow lower cholesterol diet.  Pt denies worsening depressive symptoms, suicidal ideation or panic. No fever, night sweats, wt loss, loss of appetite, or other constitutional symptoms.  Pt states good ability with ADL's, has low fall risk, home safety reviewed and adequate, no other significant changes in hearing or vision, and only occasionally active with exercise.  Has had mild worsening depressive symptoms due to marital conflict, but no suicidal ideation, or panic; has ongoing anxiety, not increased recently.   Adderall and xanax working well enough. Had CT and MRI last wk, results pending per pt. In f/u for melanoma, results pending per pt.   Past Medical History  Diagnosis Date  . ANXIETY 12/10/2007  . BACK PAIN 08/03/2009  . ERECTILE DYSFUNCTION 12/10/2007  . HYPERTENSION 11/21/2007  . INSOMNIA-SLEEP DISORDER-UNSPEC 12/20/2008  . Preventative health care 05/14/2011  . Melanoma   . ADD (attention deficit disorder) 05/11/2013  . 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 Hepatitis C 05/15/2013    Genotype 1a, Il-28 genotype CC, pt declined interferon June 2013  . Chronic pain syndrome 01/28/2014  . Depression 06/15/2014   Past Surgical History  Procedure Laterality Date  . Shoulder surgery      chronic L dislocating     reports that he has quit smoking. He does not have any smokeless tobacco history on file. He reports that he drinks alcohol. He reports that he does not use  illicit drugs. family history includes Cancer in his father and other. Current Outpatient Prescriptions on File Prior to Visit  Medication Sig Dispense Refill  . ALPRAZolam (XANAX) 1 MG tablet ONE TABLET IN MORNING AND 1-2 TABLETS IN PM. LIMIT 3 A DAY - to fill Mar 02, 2014  90 tablet  2  . amphetamine-dextroamphetamine (ADDERALL XR) 20 MG 24 hr capsule Take 1 capsule (20 mg total) by mouth every morning.  30 capsule  0  . amphetamine-dextroamphetamine (ADDERALL) 30 MG tablet Take 1 tablet (30 mg total) by mouth 2 (two) times daily.  60 tablet  0  . chlorpheniramine-HYDROcodone (TUSSIONEX PENNKINETIC ER) 10-8 MG/5ML LQCR Take 5 mLs by mouth every 12 (twelve) hours as needed.  140 mL  0  . levofloxacin (LEVAQUIN) 250 MG tablet Take 1 tablet (250 mg total) by mouth daily.  10 tablet  0  . oxyCODONE (ROXICODONE) 15 MG immediate release tablet 1 tab by mouth every 4-6 hrs as needed for pain, with maximum 4 tabs per day - to fill Mar 28, 2014  120 tablet  0  . VIAGRA 100 MG tablet TAKE ONE TABLET BY MOUTH ONCE A DAY AS NEEDED  6 tablet  2  . zolpidem (AMBIEN CR) 12.5 MG CR tablet Take 1 tablet (12.5 mg total) by mouth at bedtime as needed for sleep.  30 tablet  5  . zolpidem (AMBIEN) 10 MG tablet TAKE ONE TABLET AT BEDTIME AS NEEDED FOR SLEEP  30 tablet  5   No current facility-administered medications on file prior to visit.  Review of Systems Constitutional: Negative for increased diaphoresis, other activity, appetite or other siginficant weight change  HENT: Negative for worsening hearing loss, ear pain, facial swelling, mouth sores and neck stiffness.   Eyes: Negative for other worsening pain, redness or visual disturbance.  Respiratory: Negative for shortness of breath and wheezing.   Cardiovascular: Negative for chest pain and palpitations.  Gastrointestinal: Negative for diarrhea, blood in stool, abdominal distention or other pain Genitourinary: Negative for hematuria, flank pain or change  in urine volume.  Musculoskeletal: Negative for myalgias or other joint complaints.  Skin: Negative for color change and wound.  Neurological: Negative for syncope and numbness. other than noted Hematological: Negative for adenopathy. or other swelling Psychiatric/Behavioral: Negative for hallucinations, self-injury, decreased concentration or other worsening agitation.      Objective:   Physical Exam BP 140/84  Pulse 106  Temp(Src) 98.4 F (36.9 C) (Oral)  Ht 6\' 3"  (1.905 m)  Wt 217 lb 6 oz (98.601 kg)  BMI 27.17 kg/m2  SpO2 97% VS noted,  Constitutional: Pt is oriented to person, place, and time. Appears well-developed and well-nourished.  Head: Normocephalic and atraumatic.  Right Ear: External ear normal.  Left Ear: External ear normal.  Nose: Nose normal.  Mouth/Throat: Oropharynx is clear and moist.  Eyes: Conjunctivae and EOM are normal. Pupils are equal, round, and reactive to light.  Neck: Normal range of motion. Neck supple. No JVD present. No tracheal deviation present.  Cardiovascular: Normal rate, regular rhythm, normal heart sounds and intact distal pulses.   Pulmonary/Chest: Effort normal and breath sounds without rales or wheezing  Abdominal: Soft. Bowel sounds are normal. NT. No HSM  Musculoskeletal: Normal range of motion. Exhibits no edema.  Lymphadenopathy:  Has no cervical adenopathy.  Neurological: Pt is alert and oriented to person, place, and time. Pt has normal reflexes. No cranial nerve deficit. Motor grossly intact Skin: Skin is warm and dry. No rash noted.  Psychiatric:  Has normal mood and affect. Behavior is normal.     Assessment & Plan:

## 2014-06-22 ENCOUNTER — Other Ambulatory Visit: Payer: Self-pay | Admitting: Internal Medicine

## 2014-08-26 ENCOUNTER — Telehealth: Payer: Self-pay

## 2014-08-26 MED ORDER — AMPHETAMINE-DEXTROAMPHETAMINE 30 MG PO TABS: 30.0000 mg | ORAL_TABLET | Freq: Two times a day (BID) | ORAL | Status: DC

## 2014-08-26 MED ORDER — AMPHETAMINE-DEXTROAMPHET ER 20 MG PO CP24: 20.0000 mg | ORAL_CAPSULE | ORAL | Status: DC

## 2014-08-26 NOTE — Telephone Encounter (Signed)
Done hardcopy to robin  

## 2014-08-26 NOTE — Telephone Encounter (Signed)
Called the patient left a detailed message to pickup hardcopy's at the front desk.

## 2014-08-26 NOTE — Telephone Encounter (Signed)
The patient called requesting a (3 MONTH) refill on both strengths of Adderall.  He stated Dr. Jenny Reichmann informed him to remind of the 3 month request.  Call when ready to pickup [phone]

## 2014-09-21 ENCOUNTER — Ambulatory Visit: Payer: BC Managed Care – PPO | Admitting: Internal Medicine

## 2014-09-21 DIAGNOSIS — Z0289 Encounter for other administrative examinations: Secondary | ICD-10-CM

## 2014-10-20 ENCOUNTER — Other Ambulatory Visit: Payer: Self-pay | Admitting: Internal Medicine

## 2014-10-20 NOTE — Telephone Encounter (Signed)
Done hardcopy to robin  

## 2014-10-20 NOTE — Telephone Encounter (Signed)
Faxed hardcopy for Zolpidem to [facility]

## 2014-11-17 ENCOUNTER — Telehealth: Payer: Self-pay | Admitting: Internal Medicine

## 2014-11-17 NOTE — Telephone Encounter (Signed)
Called left a detailed message of MD instructions on refill.

## 2014-11-17 NOTE — Telephone Encounter (Signed)
Too soon for xanax, since he was given a 6 mo rx with first fill on June 15, 2014  Too soon for adderall as last rx was for Nov 20 to fill

## 2014-11-17 NOTE — Telephone Encounter (Signed)
Pt needs refills: Adderall XR 20 mg, 30 capsules---3 mos worth Adderall Instant tablets, 30 mg, 60 tablets---3 mos worth Xanex (if not already sent)

## 2014-12-15 ENCOUNTER — Telehealth: Payer: Self-pay | Admitting: Internal Medicine

## 2014-12-15 MED ORDER — AMPHETAMINE-DEXTROAMPHET ER 20 MG PO CP24: 20.0000 mg | ORAL_CAPSULE | ORAL | Status: DC

## 2014-12-15 MED ORDER — AMPHETAMINE-DEXTROAMPHETAMINE 30 MG PO TABS: 30.0000 mg | ORAL_TABLET | Freq: Two times a day (BID) | ORAL | Status: DC

## 2014-12-15 NOTE — Telephone Encounter (Signed)
Called the patient informed to pickup hardcopy's at the front desk.

## 2014-12-15 NOTE — Telephone Encounter (Signed)
Patient wants to see if he could get script refilled for adderall xr and adderall 30 mg

## 2014-12-16 ENCOUNTER — Other Ambulatory Visit: Payer: Self-pay | Admitting: Internal Medicine

## 2014-12-16 NOTE — Telephone Encounter (Signed)
Done hardcopy to robin  

## 2014-12-16 NOTE — Telephone Encounter (Signed)
Faxed hardcopy for Alprazolam to [facility]

## 2015-01-13 ENCOUNTER — Encounter: Payer: Self-pay | Admitting: Internal Medicine

## 2015-01-13 ENCOUNTER — Ambulatory Visit (INDEPENDENT_AMBULATORY_CARE_PROVIDER_SITE_OTHER): Payer: Medicare HMO | Admitting: Internal Medicine

## 2015-01-13 VITALS — BP 172/86 | HR 102 | Temp 98.0°F | Ht 75.0 in | Wt 225.1 lb

## 2015-01-13 DIAGNOSIS — G4700 Insomnia, unspecified: Secondary | ICD-10-CM | POA: Diagnosis not present

## 2015-01-13 DIAGNOSIS — F988 Other specified behavioral and emotional disorders with onset usually occurring in childhood and adolescence: Secondary | ICD-10-CM

## 2015-01-13 DIAGNOSIS — F909 Attention-deficit hyperactivity disorder, unspecified type: Secondary | ICD-10-CM

## 2015-01-13 DIAGNOSIS — F32A Depression, unspecified: Secondary | ICD-10-CM

## 2015-01-13 DIAGNOSIS — Z0189 Encounter for other specified special examinations: Secondary | ICD-10-CM

## 2015-01-13 DIAGNOSIS — I10 Essential (primary) hypertension: Secondary | ICD-10-CM | POA: Diagnosis not present

## 2015-01-13 DIAGNOSIS — Z0000 Encounter for general adult medical examination without abnormal findings: Secondary | ICD-10-CM

## 2015-01-13 DIAGNOSIS — F329 Major depressive disorder, single episode, unspecified: Secondary | ICD-10-CM

## 2015-01-13 MED ORDER — AMPHETAMINE-DEXTROAMPHETAMINE 30 MG PO TABS: 30.0000 mg | ORAL_TABLET | Freq: Two times a day (BID) | ORAL | Status: DC

## 2015-01-13 MED ORDER — ZOLPIDEM TARTRATE ER 12.5 MG PO TBCR: 12.5000 mg | EXTENDED_RELEASE_TABLET | Freq: Every evening | ORAL | Status: DC | PRN

## 2015-01-13 MED ORDER — AMPHETAMINE-DEXTROAMPHET ER 20 MG PO CP24: 20.0000 mg | ORAL_CAPSULE | ORAL | Status: DC

## 2015-01-13 NOTE — Assessment & Plan Note (Signed)
Ok for change to [facility] per pt request, alternates between ER and regular as he seems to get used to it

## 2015-01-13 NOTE — Assessment & Plan Note (Addendum)
Overall stable by history and exam, though elev today, recent data reviewed with pt, and pt to continue medical treatment as before - delcines any med change,  to f/u any worsening symptoms or concerns BP Readings from Last 3 Encounters:  01/13/15 172/86  06/15/14 140/84  01/28/14 140/90

## 2015-01-13 NOTE — Progress Notes (Signed)
Pre visit review using our clinic review tool, if applicable. No additional management support is needed unless otherwise documented below in the visit note. 

## 2015-01-13 NOTE — Assessment & Plan Note (Signed)
Improved, cont same tx, declines counseling referral

## 2015-01-13 NOTE — Patient Instructions (Signed)
Please continue all other medications as before, and refills have been done if requested - the adderall, and ambien  Please have the pharmacy call with any other refills you may need.  Please continue your efforts at being more active, low cholesterol diet, and weight control.  Please keep your appointments with your specialists as you may have planned  Please return in 6 months, or sooner if needed, with Lab testing done 3-5 days before

## 2015-01-13 NOTE — Progress Notes (Signed)
Subjective:    Patient ID: Evan [name], male    DOB: 02/11/1971, 44 y.o.   MRN: [id number]  HPI  Here to f/u; overall doing ok,  Pt denies chest pain, increased sob or doe, wheezing, orthopnea, PND, increased LE swelling, palpitations, dizziness or syncope.  Pt denies polydipsia, polyuria, or low sugar symptoms such as weakness or confusion improved with po intake.  Pt denies new neurological symptoms such as new headache, or facial or extremity weakness or numbness.   Pt states overall good compliance with meds, has been trying to follow lower cholesterol diet, with wt overall stable,  but little exercise however. BP at other providers offices mult times - BP "ok"AC.  No longer on chronic pain med since we last rx here.  ADD med working well, .Lexapro as well with much improved depression symtoms, less anxiety Past Medical History  Diagnosis Date  . ANXIETY 12/10/2007  . BACK PAIN 08/03/2009  . ERECTILE DYSFUNCTION 12/10/2007  . HYPERTENSION 11/21/2007  . INSOMNIA-SLEEP DISORDER-UNSPEC 12/20/2008  . Preventative health care 05/14/2011  . Melanoma   . ADD (attention deficit disorder) 05/11/2013  . 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 Hepatitis C 05/15/2013    Genotype 1a, Il-28 genotype CC, pt declined interferon June 2013  . Chronic pain syndrome 01/28/2014  . Depression 06/15/2014   Past Surgical History  Procedure Laterality Date  . Shoulder surgery      chronic L dislocating     reports that he has quit smoking. He does not have any smokeless tobacco history on file. He reports that he drinks alcohol. He reports that he does not use illicit drugs. family history includes Cancer in his father and other. Allergies  Allergen Reactions  . Contrast Media [Iodinated Diagnostic Agents]     Shortness of breath., throat swelling  . Other Swelling    seafood   Current Outpatient Prescriptions on File Prior to Visit  Medication Sig Dispense Refill  . ALPRAZolam (XANAX) 1 MG  tablet TAKE ONE TABLET IN THE MORNING AND 1 OR 2 TABLETS IN THE EVENING -LIMIT 3 TABLETS PER DAY 90 tablet 5  . chlorpheniramine-HYDROcodone (TUSSIONEX PENNKINETIC ER) 10-8 MG/5ML LQCR Take 5 mLs by mouth every 12 (twelve) hours as needed. 140 mL 0  . oxyCODONE (ROXICODONE) 15 MG immediate release tablet 1 tab by mouth every 4-6 hrs as needed for pain, with maximum 4 tabs per day - to fill Mar 28, 2014 120 tablet 0  . VIAGRA 100 MG tablet TAKE ONE TABLET BY MOUTH ONCE A DAY AS NEEDED 6 tablet 2  . escitalopram (LEXAPRO) 10 MG tablet Take 1 tablet (10 mg total) by mouth daily. 90 tablet 3   No current facility-administered medications on file prior to visit.    Review of Systems  Constitutional: Negative for unusual diaphoresis or other sweats  HENT: Negative for ringing in ear Eyes: Negative for double vision or worsening visual disturbance.  Respiratory: Negative for choking and stridor.   Gastrointestinal: Negative for vomiting or other signifcant bowel change Genitourinary: Negative for hematuria or decreased urine volume.  Musculoskeletal: Negative for other MSK pain or swelling Skin: Negative for color change and worsening wound.  Neurological: Negative for tremors and numbness other than noted  Psychiatric/Behavioral: Negative for decreased concentration or agitation other than above       Objective:   Physical Exam BP 172/86 mmHg  Pulse 102  Temp(Src) 98 F (36.7 C) (Oral)  Ht 6\' 3"  (1.905 m)  Wt  225 lb 2 oz (102.116 kg)  BMI 28.14 kg/m2  SpO2 97%  Constitutional: Negative for unusual diaphoresis or other sweats  HENT: Negative for ringing in ear Eyes: Negative for double vision or worsening visual disturbance.  Respiratory: Negative for choking and stridor.   Gastrointestinal: Negative for vomiting or other signifcant bowel change Genitourinary: Negative for hematuria or decreased urine volume.  Musculoskeletal: Negative for other MSK pain or swelling Skin: Negative for  color change and worsening wound.  Neurological: Negative for tremors and numbness other than noted  Psychiatric/Behavioral: Negative for decreased concentration or agitation other than above   BP Readings from Last 3 Encounters:  01/13/15 172/86  06/15/14 140/84  01/28/14 140/90       Assessment & Plan:

## 2015-01-13 NOTE — Assessment & Plan Note (Signed)
Stable, for med refills

## 2015-04-18 ENCOUNTER — Telehealth: Payer: Self-pay | Admitting: Internal Medicine

## 2015-04-18 NOTE — Telephone Encounter (Signed)
Patient needs refills for amphetamine-dextroamphetamine (ADDERALL XR) 20 MG 24 hr capsule [[id number]] and amphetamine-dextroamphetamine (ADDERALL) 30 MG tablet [[id number]]. Patient says you will fill a 3 mos supply then he will make an appointment with you when those are done. He is totally out of medication.

## 2015-04-18 NOTE — Telephone Encounter (Signed)
This is the correct phone number to use [phone]

## 2015-04-19 MED ORDER — AMPHETAMINE-DEXTROAMPHET ER 20 MG PO CP24: 20.0000 mg | ORAL_CAPSULE | ORAL | Status: DC

## 2015-04-19 MED ORDER — AMPHETAMINE-DEXTROAMPHETAMINE 30 MG PO TABS: 30.0000 mg | ORAL_TABLET | Freq: Two times a day (BID) | ORAL | Status: DC

## 2015-04-19 NOTE — Telephone Encounter (Signed)
Pt. Is aware that medication is at the front desk ready for pickup.

## 2015-04-19 NOTE — Telephone Encounter (Signed)
Done hardcopy to Cherina  

## 2015-05-16 ENCOUNTER — Other Ambulatory Visit: Payer: Self-pay | Admitting: Internal Medicine

## 2015-06-15 ENCOUNTER — Telehealth: Payer: Self-pay | Admitting: Internal Medicine

## 2015-06-15 NOTE — Telephone Encounter (Signed)
Done hardcopy to steph 

## 2015-06-16 NOTE — Telephone Encounter (Signed)
Patient states pharmacy has not received script.  Would like a call once sent at [phone].

## 2015-06-23 ENCOUNTER — Ambulatory Visit (INDEPENDENT_AMBULATORY_CARE_PROVIDER_SITE_OTHER): Payer: BLUE CROSS/BLUE SHIELD | Admitting: Internal Medicine

## 2015-06-23 ENCOUNTER — Encounter: Payer: Self-pay | Admitting: Internal Medicine

## 2015-06-23 ENCOUNTER — Ambulatory Visit: Payer: BLUE CROSS/BLUE SHIELD | Admitting: Internal Medicine

## 2015-06-23 VITALS — BP 118/78 | HR 82 | Temp 97.6°F | Wt 204.0 lb

## 2015-06-23 DIAGNOSIS — Z0000 Encounter for general adult medical examination without abnormal findings: Secondary | ICD-10-CM

## 2015-06-23 DIAGNOSIS — F988 Other specified behavioral and emotional disorders with onset usually occurring in childhood and adolescence: Secondary | ICD-10-CM

## 2015-06-23 MED ORDER — ZOLPIDEM TARTRATE ER 12.5 MG PO TBCR: 12.5000 mg | EXTENDED_RELEASE_TABLET | Freq: Every evening | ORAL | Status: DC | PRN

## 2015-06-23 MED ORDER — AMPHETAMINE-DEXTROAMPHETAMINE 30 MG PO TABS: 30.0000 mg | ORAL_TABLET | Freq: Two times a day (BID) | ORAL | Status: DC

## 2015-06-23 MED ORDER — AMPHETAMINE-DEXTROAMPHET ER 20 MG PO CP24: 20.0000 mg | ORAL_CAPSULE | ORAL | Status: DC

## 2015-06-23 MED ORDER — ZOLPIDEM TARTRATE 10 MG PO TABS: 10.0000 mg | ORAL_TABLET | Freq: Every evening | ORAL | Status: DC | PRN

## 2015-06-23 MED ORDER — ESCITALOPRAM OXALATE 10 MG PO TABS: 10.0000 mg | ORAL_TABLET | Freq: Every day | ORAL | Status: DC

## 2015-06-23 NOTE — Telephone Encounter (Signed)
Hi Dr Jenny Reichmann disregard that message, your 4:00 patient cancelled and I put this patient in that slot.

## 2015-06-23 NOTE — Telephone Encounter (Signed)
Dahlia see below

## 2015-06-23 NOTE — Assessment & Plan Note (Signed)
Stable, for med refills today

## 2015-06-23 NOTE — Telephone Encounter (Signed)
Pt is scheduled today @ 4p

## 2015-06-23 NOTE — Assessment & Plan Note (Signed)

## 2015-06-23 NOTE — Telephone Encounter (Signed)
[place] done (both types)  Please be aware the date is july25 to fill for both types of adderall as the last rx was for June 18, 2015, and he is early for refill

## 2015-06-23 NOTE — Telephone Encounter (Signed)
Patient is here, he was late and missed his appointment, because of a dentist appt, he would like to know if he could get his prescription for Adderall while he is here because he is going out of town tomorrow, please advise

## 2015-06-23 NOTE — Progress Notes (Signed)
Subjective:    Patient ID: Evan [name], male    DOB: Feb 02, 1971, 44 y.o.   MRN: [id number]  HPI  Here for wellness and f/u;  Overall doing ok;  Pt denies Chest pain, worsening SOB, DOE, wheezing, orthopnea, PND, worsening LE edema, palpitations, dizziness or syncope.  Pt denies neurological change such as new headache, facial or extremity weakness.  Pt denies polydipsia, polyuria, or low sugar symptoms. Pt states overall good compliance with treatment and medications, good tolerability, and has been trying to follow appropriate diet.  Pt denies worsening depressive symptoms, suicidal ideation or panic. No fever, night sweats, wt loss, loss of appetite, or other constitutional symptoms.  Pt states good ability with ADL's, has low fall risk, home safety reviewed and adequate, no other significant changes in hearing or vision, and only occasionally active with exercise. S/p right side tooth pulled today, still numb now, after tooth cracked.   He is unaware his last adderal rx's were dated June 25, and he has given to pharmacy to keep on file. Declines labs today as he has done elsewhere.  Past Medical History  Diagnosis Date  . ANXIETY 12/10/2007  . BACK PAIN 08/03/2009  . ERECTILE DYSFUNCTION 12/10/2007  . HYPERTENSION 11/21/2007  . INSOMNIA-SLEEP DISORDER-UNSPEC 12/20/2008  . Preventative health care 05/14/2011  . Melanoma   . ADD (attention deficit disorder) 05/11/2013  . 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 Hepatitis C 05/15/2013    Genotype 1a, Il-28 genotype CC, pt declined interferon June 2013  . Chronic pain syndrome 01/28/2014  . Depression 06/15/2014   Past Surgical History  Procedure Laterality Date  . Shoulder surgery      chronic L dislocating     reports that he has quit smoking. He does not have any smokeless tobacco history on file. He reports that he drinks alcohol. He reports that he does not use illicit drugs. family history includes Cancer in his father and  other. Allergies  Allergen Reactions  . Contrast Media [Iodinated Diagnostic Agents]     Shortness of breath., throat swelling  . Other Swelling    seafood   Current Outpatient Prescriptions on File Prior to Visit  Medication Sig Dispense Refill  . ALPRAZolam (XANAX) 1 MG tablet TAKE ONE TABLET IN THE MORNING AND 1 OR 2 TABLETS IN THE EVENING -LIMIT 3 TABLETS PER DAY 90 tablet 5  . amphetamine-dextroamphetamine (ADDERALL XR) 20 MG 24 hr capsule Take 1 capsule (20 mg total) by mouth every morning. To fill July 18, 2015 30 capsule 0  . amphetamine-dextroamphetamine (ADDERALL) 30 MG tablet Take 1 tablet by mouth 2 (two) times daily. To fill July 18, 2015 60 tablet 0  . chlorpheniramine-HYDROcodone (TUSSIONEX PENNKINETIC ER) 10-8 MG/5ML LQCR Take 5 mLs by mouth every 12 (twelve) hours as needed. 140 mL 0  . oxyCODONE (ROXICODONE) 15 MG immediate release tablet 1 tab by mouth every 4-6 hrs as needed for pain, with maximum 4 tabs per day - to fill Mar 28, 2014 120 tablet 0  . VIAGRA 100 MG tablet TAKE ONE TABLET BY MOUTH ONCE A DAY AS NEEDED 6 tablet 2  . escitalopram (LEXAPRO) 10 MG tablet Take 1 tablet (10 mg total) by mouth daily. 90 tablet 3   No current facility-administered medications on file prior to visit.   Review of Systems Constitutional: Negative for increased diaphoresis, other activity, appetite or siginficant weight change other than noted HENT: Negative for worsening hearing loss, ear pain, facial swelling, mouth sores and  neck stiffness.   Eyes: Negative for other worsening pain, redness or visual disturbance.  Respiratory: Negative for shortness of breath and wheezing  Cardiovascular: Negative for chest pain and palpitations.  Gastrointestinal: Negative for diarrhea, blood in stool, abdominal distention or other pain Genitourinary: Negative for hematuria, flank pain or change in urine volume.  Musculoskeletal: Negative for myalgias or other joint complaints.  Skin: Negative  for color change and wound or drainage.  Neurological: Negative for syncope and numbness. other than noted Hematological: Negative for adenopathy. or other swelling Psychiatric/Behavioral: Negative for hallucinations, SI, self-injury, decreased concentration or other worsening agitation.      Objective:   Physical Exam BP 118/78 mmHg  Pulse 82  Temp(Src) 97.6 F (36.4 C)  Wt 204 lb (92.534 kg)  SpO2 97% VS noted,  Constitutional: Pt is oriented to person, place, and time. Appears well-developed and well-nourished, in no significant distress Head: Normocephalic and atraumatic.  Right Ear: External ear normal.  Left Ear: External ear normal.  Nose: Nose normal.  Mouth/Throat: Oropharynx is clear and moist.  Eyes: Conjunctivae and EOM are normal. Pupils are equal, round, and reactive to light.  Neck: Normal range of motion. Neck supple. No JVD present. No tracheal deviation present or significant neck LA or mass Cardiovascular: Normal rate, regular rhythm, normal heart sounds and intact distal pulses.   Pulmonary/Chest: Effort normal and breath sounds without rales or wheezing  Abdominal: Soft. Bowel sounds are normal. NT. No HSM  Musculoskeletal: Normal range of motion. Exhibits no edema.  Lymphadenopathy:  Has no cervical adenopathy.  Neurological: Pt is alert and oriented to person, place, and time. Pt has normal reflexes. No cranial nerve deficit. Motor grossly intact Skin: Skin is warm and dry. No rash noted.  Psychiatric:  Has mild nervous mood and affect. Behavior is normal.      Assessment & Plan:

## 2015-06-23 NOTE — Patient Instructions (Signed)
Please continue all other medications as before, and refills have been done if requested.  Please have the pharmacy call with any other refills you may need.  Please continue your efforts at being more active, low cholesterol diet, and weight control.  You are otherwise up to date with prevention measures today.  Please keep your appointments with your specialists as you may have planned  Please return in 6 months, or sooner if needed 

## 2015-06-23 NOTE — Progress Notes (Signed)
Pre visit review using our clinic review tool, if applicable. No additional management support is needed unless otherwise documented below in the visit note. 

## 2015-06-30 ENCOUNTER — Ambulatory Visit: Payer: Medicare HMO | Admitting: Internal Medicine

## 2015-10-21 ENCOUNTER — Telehealth: Payer: Self-pay | Admitting: *Deleted

## 2015-10-21 MED ORDER — AMPHETAMINE-DEXTROAMPHETAMINE 30 MG PO TABS
30.0000 mg | ORAL_TABLET | Freq: Two times a day (BID) | ORAL | Status: DC
Start: 2015-10-21 — End: 2016-01-27

## 2015-10-21 MED ORDER — AMPHETAMINE-DEXTROAMPHET ER 20 MG PO CP24: 20.0000 mg | ORAL_CAPSULE | ORAL | Status: DC

## 2015-10-21 MED ORDER — AMPHETAMINE-DEXTROAMPHETAMINE 30 MG PO TABS: 30.0000 mg | ORAL_TABLET | Freq: Two times a day (BID) | ORAL | Status: DC

## 2015-10-21 NOTE — Telephone Encounter (Signed)
Left msg on triage requesting refills on his Adderral would like md to go ahead and give 3 months...Evan [name]

## 2015-10-21 NOTE — Telephone Encounter (Signed)
Tried calling pt no answer couldn't leave msg due to vm being full. Sent # to pager Rx's are ready for pick-up...Evan [name]

## 2015-10-21 NOTE — Telephone Encounter (Signed)
All 6 Done hardcopy to [facility]

## 2015-12-22 ENCOUNTER — Other Ambulatory Visit: Payer: Self-pay | Admitting: Internal Medicine

## 2015-12-22 NOTE — Telephone Encounter (Signed)
Done hardcopy to Dahlia  

## 2015-12-23 NOTE — Telephone Encounter (Signed)
Rx faxed to pharmacy  

## 2016-01-18 ENCOUNTER — Other Ambulatory Visit: Payer: Self-pay | Admitting: Internal Medicine

## 2016-01-18 NOTE — Telephone Encounter (Signed)
Done hardcopy to [name]  

## 2016-01-19 NOTE — Telephone Encounter (Signed)
Medication printed signed and faxed.

## 2016-01-27 ENCOUNTER — Ambulatory Visit (INDEPENDENT_AMBULATORY_CARE_PROVIDER_SITE_OTHER): Payer: Medicare HMO | Admitting: Internal Medicine

## 2016-01-27 ENCOUNTER — Encounter: Payer: Self-pay | Admitting: Internal Medicine

## 2016-01-27 VITALS — BP 140/86 | HR 101 | Temp 98.2°F | Resp 20 | Wt 207.0 lb

## 2016-01-27 DIAGNOSIS — I10 Essential (primary) hypertension: Secondary | ICD-10-CM

## 2016-01-27 DIAGNOSIS — F909 Attention-deficit hyperactivity disorder, unspecified type: Secondary | ICD-10-CM | POA: Diagnosis not present

## 2016-01-27 DIAGNOSIS — Z0001 Encounter for general adult medical examination with abnormal findings: Secondary | ICD-10-CM | POA: Diagnosis not present

## 2016-01-27 DIAGNOSIS — F988 Other specified behavioral and emotional disorders with onset usually occurring in childhood and adolescence: Secondary | ICD-10-CM

## 2016-01-27 DIAGNOSIS — F411 Generalized anxiety disorder: Secondary | ICD-10-CM | POA: Diagnosis not present

## 2016-01-27 DIAGNOSIS — R6889 Other general symptoms and signs: Secondary | ICD-10-CM | POA: Diagnosis not present

## 2016-01-27 MED ORDER — AMPHETAMINE-DEXTROAMPHETAMINE 30 MG PO TABS: 30.0000 mg | ORAL_TABLET | Freq: Three times a day (TID) | ORAL | Status: DC

## 2016-01-27 MED ORDER — SILDENAFIL CITRATE 100 MG PO TABS: ORAL_TABLET | ORAL | Status: DC

## 2016-01-27 MED ORDER — ESCITALOPRAM OXALATE 10 MG PO TABS: 10.0000 mg | ORAL_TABLET | Freq: Every day | ORAL | Status: DC

## 2016-01-27 NOTE — Patient Instructions (Addendum)
OK to change the adderall to the 30 mg three times per day  Please continue all other medications as before, and refills have been done if requested.  Please have the pharmacy call with any other refills you may need.  Please continue your efforts at being more active, low cholesterol diet, and weight control.  You are otherwise up to date with prevention measures today.  Please keep your appointments with your specialists as you may have planned  Please return in 6 months, or sooner if needed  Please fax your recent lab information to [phone]

## 2016-01-27 NOTE — Progress Notes (Signed)
Subjective:    Patient ID: Evan [name], male    DOB: [birth year], 45 y.o.   MRN: [id number]  HPI  Here for wellness and f/u;  Overall doing ok;  Pt denies Chest pain, worsening SOB, DOE, wheezing, orthopnea, PND, worsening LE edema, palpitations, dizziness or syncope.  Pt denies neurological change such as new headache, facial or extremity weakness.  Pt denies polydipsia, polyuria, or low sugar symptoms. Pt states overall good compliance with treatment and medications, good tolerability, and has been trying to follow appropriate diet.  Pt denies worsening depressive symptoms, suicidal ideation or panic. No fever, night sweats, wt loss, loss of appetite, or other constitutional symptoms.  Pt states good ability with ADL's, has low fall risk, home safety reviewed and adequate, no other significant changes in hearing or vision, and only occasionally active with exercise.  Had recent labs at [facility] including lipids. Now getting CT every 3 mo due to some concern about cancer recurrence, saw surgeon who did not feel he needed biopsy.  Wt overall down, but stable for 6 months. Adderall xr no longer affordable due to change in insurance coverage.   Wt Readings from Last 3 Encounters:  01/27/16 207 lb (93.895 kg)  06/23/15 204 lb (92.534 kg)  01/13/15 225 lb 2 oz (102.116 kg)  Declines flu shot,  Past Medical History  Diagnosis Date  . ANXIETY 12/10/2007  . BACK PAIN 08/03/2009  . ERECTILE DYSFUNCTION 12/10/2007  . HYPERTENSION 11/21/2007  . INSOMNIA-SLEEP DISORDER-UNSPEC 12/20/2008  . Preventative health care 05/14/2011  . Melanoma ([place])   . ADD (attention deficit disorder) 05/11/2013  . 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 Hepatitis C 05/15/2013    Genotype 1a, Il-28 genotype CC, pt declined interferon June 2013  . Chronic pain syndrome 01/28/2014  . Depression 06/15/2014   Past Surgical History  Procedure Laterality Date  . Shoulder surgery      chronic L dislocating     reports  that he has quit smoking. He does not have any smokeless tobacco history on file. He reports that he drinks alcohol. He reports that he does not use illicit drugs. family history includes Cancer in his father and other. Allergies  Allergen Reactions  . Contrast Media [Iodinated Diagnostic Agents]     Shortness of breath., throat swelling  . Other Swelling    seafood   Current Outpatient Prescriptions on File Prior to Visit  Medication Sig Dispense Refill  . ALPRAZolam (XANAX) 1 MG tablet TAKE ONE TABLET IN THE MORNING AND 1 OR 2 TABLETS IN THE EVENING -LIMIT 3 TABLETS PER DAY 90 tablet 5  . oxyCODONE (ROXICODONE) 15 MG immediate release tablet 1 tab by mouth every 4-6 hrs as needed for pain, with maximum 4 tabs per day - to fill Mar 28, 2014 120 tablet 0  . zolpidem (AMBIEN) 10 MG tablet TAKE ONE TABLET AT BEDTIME AS NEEDED FOR SLEEP 30 tablet 5   No current facility-administered medications on file prior to visit.    Review of Systems Constitutional: Negative for increased diaphoresis, other activity, appetite or siginficant weight change other than noted HENT: Negative for worsening hearing loss, ear pain, facial swelling, mouth sores and neck stiffness.   Eyes: Negative for other worsening pain, redness or visual disturbance.  Respiratory: Negative for shortness of breath and wheezing  Cardiovascular: Negative for chest pain and palpitations.  Gastrointestinal: Negative for diarrhea, blood in stool, abdominal distention or other pain Genitourinary: Negative for hematuria, flank pain or change  in urine volume.  Musculoskeletal: Negative for myalgias or other joint complaints.  Skin: Negative for color change and wound or drainage.  Neurological: Negative for syncope and numbness. other than noted Hematological: Negative for adenopathy. or other swelling Psychiatric/Behavioral: Negative for hallucinations, SI, self-injury, decreased concentration or other worsening agitation.        Objective:   Physical Exam BP 140/86 mmHg  Pulse 101  Temp(Src) 98.2 F (36.8 C) (Oral)  Resp 20  Wt 207 lb (93.895 kg)  SpO2 92% VS noted,  Constitutional: Pt is oriented to person, place, and time. Appears well-developed and well-nourished, in no significant distress Head: Normocephalic and atraumatic.  Right Ear: External ear normal.  Left Ear: External ear normal.  Nose: Nose normal.  Mouth/Throat: Oropharynx is clear and moist.  Eyes: Conjunctivae and EOM are normal. Pupils are equal, round, and reactive to light.  Neck: Normal range of motion. Neck supple. No JVD present. No tracheal deviation present or significant neck LA or mass Cardiovascular: Normal rate, regular rhythm, normal heart sounds and intact distal pulses.   Pulmonary/Chest: Effort normal and breath sounds without rales or wheezing  Abdominal: Soft. Bowel sounds are normal. NT. No HSM  Musculoskeletal: Normal range of motion. Exhibits no edema.  Lymphadenopathy:  Has no cervical adenopathy.  Neurological: Pt is alert and oriented to person, place, and time. Pt has normal reflexes. No cranial nerve deficit. Motor grossly intact Skin: Skin is warm and dry. No rash noted.  Psychiatric:  Has nervous mood and affect. Behavior is normal.     Assessment & Plan:

## 2016-01-27 NOTE — Progress Notes (Signed)
Pre visit review using our clinic review tool, if applicable. No additional management support is needed unless otherwise documented below in the visit note. 

## 2016-01-28 NOTE — Assessment & Plan Note (Addendum)
Overall doing well, age appropriate education and counseling updated, referrals for preventative services and immunizations addressed, dietary and smoking counseling addressed, most recent labs reviewed.  I have personally reviewed and have noted:  1) the patient's medical and social history 2) The pt's use of alcohol, tobacco, and illicit drugs 3) The patient's current medications and supplements 4) Functional ability including ADL's, fall risk, home safety risk, hearing and visual impairment 5) Diet and physical activities 6) Evidence for depression or mood disorder 7) The patient's height, weight, and BMI have been recorded in the chart  I have made referrals, and provided counseling and education based on review of the above Pt to fax recent labs from his [facility] evaluations

## 2016-01-28 NOTE — Assessment & Plan Note (Signed)
Ok to change adderall to 30 tid,  to f/u any worsening symptoms or concerns

## 2016-01-28 NOTE — Assessment & Plan Note (Signed)
stable overall by history and exam, recent data reviewed with pt, and pt to continue medical treatment as before,  to f/u any worsening symptoms or concerns BP Readings from Last 3 Encounters:  01/27/16 140/86  06/23/15 118/78  01/13/15 172/86

## 2016-01-28 NOTE — Assessment & Plan Note (Signed)
stable overall by history and exam, recent data reviewed with pt, and pt to continue medical treatment as before,  to f/u any worsening symptoms or concerns Lab Results  Component Value Date   WBC 8.9 08/31/2011   HGB 13.5 08/31/2011   HCT 41.4 08/31/2011   PLT 329 08/31/2011   GLUCOSE 91 08/17/2011   ALT 20 08/17/2011   AST 22 08/17/2011   NA 139 08/17/2011   K 4.3 08/17/2011   CL 106 08/17/2011   CREATININE 0.89 08/17/2011   BUN 22 08/17/2011   CO2 24 08/17/2011   INR 0.85 08/31/2011

## 2016-02-29 ENCOUNTER — Telehealth: Payer: Self-pay

## 2016-02-29 NOTE — Telephone Encounter (Signed)
Reviewed chart notes from [place]. Called pt and confirmed rationale for 30 mg adderall tid. Pt stated it was increased after the Adderall XR 30 mg daily was discontinued due to cost.   Submitted clinical data to plan for determination.

## 2016-02-29 NOTE — Telephone Encounter (Signed)
PA submitted via cover my meds. KEY: [id number]

## 2016-03-01 NOTE — Telephone Encounter (Signed)
PA approved 90/30 Adderall 30 mg tid until 03/01/2017.   Pt and pharmacy informed of approval.

## 2016-04-24 ENCOUNTER — Telehealth: Payer: Self-pay | Admitting: *Deleted

## 2016-04-24 ENCOUNTER — Telehealth: Payer: Self-pay | Admitting: Internal Medicine

## 2016-04-24 MED ORDER — AMPHETAMINE-DEXTROAMPHETAMINE 30 MG PO TABS: 30.0000 mg | ORAL_TABLET | Freq: Three times a day (TID) | ORAL | Status: DC

## 2016-04-24 NOTE — Telephone Encounter (Signed)
Done hardcopy to [name]  

## 2016-04-24 NOTE — Telephone Encounter (Signed)
Received call pt requesting refills for 3 months at a time on his Adderrall...Evan [name]

## 2016-04-25 NOTE — Telephone Encounter (Signed)
Notified pt rx's ready for pick-up../lmb 

## 2016-04-25 NOTE — Telephone Encounter (Signed)
Hardcopy given to lucy 

## 2016-05-18 ENCOUNTER — Emergency Department (HOSPITAL_COMMUNITY): Payer: Medicare HMO

## 2016-05-18 ENCOUNTER — Emergency Department (HOSPITAL_COMMUNITY)
Admission: EM | Admit: 2016-05-18 | Discharge: 2016-05-18 | Discharge status: Against Medical Advice | Payer: Medicare HMO | Attending: Emergency Medicine | Admitting: Emergency Medicine

## 2016-05-18 DIAGNOSIS — F329 Major depressive disorder, single episode, unspecified: Secondary | ICD-10-CM | POA: Insufficient documentation

## 2016-05-18 DIAGNOSIS — Z87891 Personal history of nicotine dependence: Secondary | ICD-10-CM | POA: Insufficient documentation

## 2016-05-18 DIAGNOSIS — S61531A Puncture wound without foreign body of right wrist, initial encounter: Secondary | ICD-10-CM | POA: Diagnosis present

## 2016-05-18 DIAGNOSIS — I10 Essential (primary) hypertension: Secondary | ICD-10-CM | POA: Insufficient documentation

## 2016-05-18 DIAGNOSIS — Z85820 Personal history of malignant melanoma of skin: Secondary | ICD-10-CM | POA: Insufficient documentation

## 2016-05-18 DIAGNOSIS — Y999 Unspecified external cause status: Secondary | ICD-10-CM | POA: Diagnosis not present

## 2016-05-18 DIAGNOSIS — Y92481 Parking lot as the place of occurrence of the external cause: Secondary | ICD-10-CM | POA: Diagnosis not present

## 2016-05-18 DIAGNOSIS — IMO0002 Reserved for concepts with insufficient information to code with codable children: Secondary | ICD-10-CM

## 2016-05-18 DIAGNOSIS — Y939 Activity, unspecified: Secondary | ICD-10-CM | POA: Diagnosis not present
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
## 2016-05-18 NOTE — ED Notes (Signed)
Pt not found in room gown on bed. Unable to locate pt. Tamera Punt, MD aware.
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
## 2016-05-19 ENCOUNTER — Emergency Department (HOSPITAL_COMMUNITY)
Admission: EM | Admit: 2016-05-19 | Discharge: 2016-05-19 | Disposition: A | Discharge status: Home / Self Care | Payer: No Typology Code available for payment source | Attending: Emergency Medicine | Admitting: Emergency Medicine

## 2016-05-19 ENCOUNTER — Encounter (HOSPITAL_COMMUNITY): Payer: Self-pay | Admitting: *Deleted

## 2016-05-19 ENCOUNTER — Emergency Department (HOSPITAL_COMMUNITY): Payer: No Typology Code available for payment source

## 2016-05-19 DIAGNOSIS — S80211A Abrasion, right knee, initial encounter: Secondary | ICD-10-CM | POA: Insufficient documentation

## 2016-05-19 DIAGNOSIS — Y9389 Activity, other specified: Secondary | ICD-10-CM | POA: Diagnosis not present

## 2016-05-19 DIAGNOSIS — Y999 Unspecified external cause status: Secondary | ICD-10-CM | POA: Insufficient documentation

## 2016-05-19 DIAGNOSIS — T148XXA Other injury of unspecified body region, initial encounter: Secondary | ICD-10-CM

## 2016-05-19 DIAGNOSIS — Z79899 Other long term (current) drug therapy: Secondary | ICD-10-CM | POA: Insufficient documentation

## 2016-05-19 DIAGNOSIS — Y92410 Unspecified street and highway as the place of occurrence of the external cause: Secondary | ICD-10-CM | POA: Diagnosis not present

## 2016-05-19 DIAGNOSIS — S161XXA Strain of muscle, fascia and tendon at neck level, initial encounter: Secondary | ICD-10-CM | POA: Diagnosis not present

## 2016-05-19 DIAGNOSIS — S01121A Laceration with foreign body of right eyelid and periocular area, initial encounter: Secondary | ICD-10-CM | POA: Insufficient documentation

## 2016-05-19 DIAGNOSIS — S60511A Abrasion of right hand, initial encounter: Secondary | ICD-10-CM | POA: Insufficient documentation

## 2016-05-19 DIAGNOSIS — IMO0002 Reserved for concepts with insufficient information to code with codable children: Secondary | ICD-10-CM

## 2016-05-19 DIAGNOSIS — Z87891 Personal history of nicotine dependence: Secondary | ICD-10-CM | POA: Insufficient documentation

## 2016-05-19 DIAGNOSIS — S0990XA Unspecified injury of head, initial encounter: Secondary | ICD-10-CM | POA: Diagnosis present

## 2016-05-19 DIAGNOSIS — Z85820 Personal history of malignant melanoma of skin: Secondary | ICD-10-CM | POA: Insufficient documentation

## 2016-05-19 DIAGNOSIS — I10 Essential (primary) hypertension: Secondary | ICD-10-CM | POA: Diagnosis not present

## 2016-05-19 DIAGNOSIS — F329 Major depressive disorder, single episode, unspecified: Secondary | ICD-10-CM | POA: Diagnosis not present

## 2016-05-19 DIAGNOSIS — S60512A Abrasion of left hand, initial encounter: Secondary | ICD-10-CM | POA: Insufficient documentation

## 2016-05-19 LAB — CBC
HEMATOCRIT: 43.9 % (ref 39.0–52.0)
Hemoglobin: 13.4 g/dL (ref 13.0–17.0)
MCH: 27.6 pg (ref 26.0–34.0)
MCHC: 30.5 g/dL (ref 30.0–36.0)
MCV: 90.3 fL (ref 78.0–100.0)
PLATELETS: 258 10*3/uL (ref 150–400)
RBC: 4.86 MIL/uL (ref 4.22–5.81)
RDW: 13.9 % (ref 11.5–15.5)
WBC: 9.5 10*3/uL (ref 4.0–10.5)

## 2016-05-19 LAB — ETHANOL

## 2016-05-19 LAB — COMPREHENSIVE METABOLIC PANEL
ALBUMIN: 3.9 g/dL (ref 3.5–5.0)
ALT: 15 U/L — AB (ref 17–63)
AST: 25 U/L (ref 15–41)
Alkaline Phosphatase: 105 U/L (ref 38–126)
Anion gap: 7 (ref 5–15)
BILIRUBIN TOTAL: 0.4 mg/dL (ref 0.3–1.2)
BUN: 19 mg/dL (ref 6–20)
CHLORIDE: 103 mmol/L (ref 101–111)
CO2: 31 mmol/L (ref 22–32)
CREATININE: 0.98 mg/dL (ref 0.61–1.24)
Calcium: 9.6 mg/dL (ref 8.9–10.3)
GFR calc Af Amer: >60 mL/min (ref >60)
GFR calc non Af Amer: >60 mL/min (ref >60)
GLUCOSE: 72 mg/dL (ref 65–99)
POTASSIUM: 4.3 mmol/L (ref 3.5–5.1)
Sodium: 141 mmol/L (ref 135–145)
Total Protein: 7.7 g/dL (ref 6.5–8.1)

## 2016-05-19 LAB — RAPID URINE DRUG SCREEN, HOSP PERFORMED
AMPHETAMINES: NOT DETECTED
Barbiturates: NOT DETECTED
Benzodiazepines: POSITIVE — AB
Cocaine: NOT DETECTED
OPIATES: POSITIVE — AB
Tetrahydrocannabinol: NOT DETECTED
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
## 2016-05-19 NOTE — Discharge Instructions (Signed)
Cervical Sprain A cervical sprain is when the tissues (ligaments) that hold the neck bones in place stretch or tear. HOME CARE   Put ice on the injured area.  Put ice in a plastic bag.  Place a towel between your skin and the bag.  Leave the ice on for 15-20 minutes, 3-4 times a day.  You may have been given a collar to wear. This collar keeps your neck from moving while you heal.  Do not take the collar off unless told by your doctor.  If you have long hair, keep it outside of the collar.  Ask your doctor before changing the position of your collar. You may need to change its position over time to make it more comfortable.  If you are allowed to take off the collar for cleaning or bathing, follow your doctor's instructions on how to do it safely.  Keep your collar clean by wiping it with mild soap and water. Dry it completely. If the collar has removable pads, remove them every 1-2 days to hand wash them with soap and water. Allow them to air dry. They should be dry before you wear them in the collar.  Do not drive while wearing the collar.  Only take medicine as told by your doctor.  Keep all doctor visits as told.  Keep all physical therapy visits as told.  Adjust your work station so that you have good posture while you work.  Avoid positions and activities that make your problems worse.  Warm up and stretch before being active. GET HELP IF:  Your pain is not controlled with medicine.  You cannot take less pain medicine over time as planned.  Your activity level does not improve as expected. GET HELP RIGHT AWAY IF:   You are bleeding.  Your stomach is upset.  You have an allergic reaction to your medicine.  You develop new problems that you cannot explain.  You lose feeling (become numb) or you cannot move any part of your body (paralysis).  You have tingling or weakness in any part of your body.  Your symptoms get worse. Symptoms include:  Pain,  soreness, stiffness, puffiness (swelling), or a burning feeling in your neck.  Pain when your neck is touched.  Shoulder or upper back pain.  Limited ability to move your neck.  Headache.  Dizziness.  Your hands or arms feel week, lose feeling, or tingle.  Muscle spasms.  Difficulty swallowing or chewing. MAKE SURE YOU:   Understand these instructions.  Will watch your condition.  Will get help right away if you are not doing well or get worse.   This information is not intended to replace advice given to you by your health care provider. Make sure you discuss any questions you have with your health care provider.   Document Released: 05/28/2008 Document Revised: 08/12/2013 Document Reviewed: 06/17/2013 Elsevier Interactive Patient Education 2016 [place], [place] A laceration is a cut that goes through all of the layers of the skin and into the tissue that is right under the skin. Some lacerations heal on their own. Others need to be closed with stitches (sutures), staples, skin adhesive strips, or skin glue. Proper laceration care minimizes the risk of infection and helps the laceration to heal better. HOW TO CARE FOR YOUR LACERATION If sutures or staples were used:  Keep the wound clean and dry.  If you were given a bandage (dressing), you should change it at least one time per  day or as told by your health care provider. You should also change it if it becomes wet or dirty.  Keep the wound completely dry for the first 24 hours or as told by your health care provider. After that time, you may shower or bathe. However, make sure that the wound is not soaked in water until after the sutures or staples have been removed.  Clean the wound one time each day or as told by your health care provider:  Wash the wound with soap and water.  Rinse the wound with water to remove all soap.  Pat the wound dry with a clean towel. Do not rub the wound.  After  cleaning the wound, apply a thin layer of antibiotic ointmentas told by your health care provider. This will help to prevent infection and keep the dressing from sticking to the wound.  Have the sutures or staples removed as told by your health care provider. If skin adhesive strips were used:  Keep the wound clean and dry.  If you were given a bandage (dressing), you should change it at least one time per day or as told by your health care provider. You should also change it if it becomes dirty or wet.  Do not get the skin adhesive strips wet. You may shower or bathe, but be careful to keep the wound dry.  If the wound gets wet, pat it dry with a clean towel. Do not rub the wound.  Skin adhesive strips fall off on their own. You may trim the strips as the wound heals. Do not remove skin adhesive strips that are still stuck to the wound. They will fall off in time. If skin glue was used:  Try to keep the wound dry, but you may briefly wet it in the shower or bath. Do not soak the wound in water, such as by swimming.  After you have showered or bathed, gently pat the wound dry with a clean towel. Do not rub the wound.  Do not do any activities that will make you sweat heavily until the skin glue has fallen off on its own.  Do not apply liquid, cream, or ointment medicine to the wound while the skin glue is in place. Using those may loosen the film before the wound has healed.  If you were given a bandage (dressing), you should change it at least one time per day or as told by your health care provider. You should also change it if it becomes dirty or wet.  If a dressing is placed over the wound, be careful not to apply tape directly over the skin glue. Doing that may cause the glue to be pulled off before the wound has healed.  Do not pick at the glue. The skin glue usually remains in place for 5-10 days, then it falls off of the skin. General Instructions  Take over-the-counter and  prescription medicines only as told by your health care provider.  If you were prescribed an antibiotic medicine or ointment, take or apply it as told by your doctor. Do not stop using it even if your condition improves.  To help prevent scarring, make sure to cover your wound with sunscreen whenever you are outside after stitches are removed, after adhesive strips are removed, or when glue remains in place and the wound is healed. Make sure to wear a sunscreen of at least 30 SPF.  Do not scratch or pick at the wound.  Keep all follow-up  visits as told by your health care provider. This is important.  Check your wound every day for signs of infection. Watch for:  Redness, swelling, or pain.  Fluid, blood, or pus.  Raise (elevate) the injured area above the level of your heart while you are sitting or lying down, if possible. SEEK MEDICAL CARE IF:  You received a tetanus shot and you have swelling, severe pain, redness, or bleeding at the injection site.  You have a fever.  A wound that was closed breaks open.  You notice a bad smell coming from your wound or your dressing.  You notice something coming out of the wound, such as wood or glass.  Your pain is not controlled with medicine.  You have increased redness, swelling, or pain at the site of your wound.  You have fluid, blood, or pus coming from your wound.  You notice a change in the color of your skin near your wound.  You need to change the dressing frequently due to fluid, blood, or pus draining from the wound.  You develop a new rash.  You develop numbness around the wound. SEEK IMMEDIATE MEDICAL CARE IF:  You develop severe swelling around the wound.  Your pain suddenly increases and is severe.  You develop painful lumps near the wound or on skin that is anywhere on your body.  You have a red streak going away from your wound.  The wound is on your hand or foot and you cannot properly move a finger or  toe.  The wound is on your hand or foot and you notice that your fingers or toes look pale or bluish.   This information is not intended to replace advice given to you by your health care provider. Make sure you discuss any questions you have with your health care provider.   Document Released: 12/10/2005 Document Revised: 04/26/2015 Document Reviewed: 12/06/2014 Elsevier Interactive Patient Education 2016 [place] An abrasion is a cut or scrape on the outer surface of your skin. An abrasion does not extend through all of the layers of your skin. It is important to care for your abrasion properly to prevent infection. CAUSES Most abrasions are caused by falling on or gliding across the ground or another surface. When your skin rubs on something, the outer and inner layer of skin rubs off.  SYMPTOMS A cut or scrape is the main symptom of this condition. The scrape may be bleeding, or it may appear red or pink. If there was an associated fall, there may be an underlying bruise. DIAGNOSIS An abrasion is diagnosed with a physical exam. TREATMENT Treatment for this condition depends on how large and deep the abrasion is. Usually, your abrasion will be cleaned with water and mild soap. This removes any dirt or debris that may be stuck. An antibiotic ointment may be applied to the abrasion to help prevent infection. A bandage (dressing) may be placed on the abrasion to keep it clean. You may also need a tetanus shot. HOME CARE INSTRUCTIONS Medicines  Take or apply medicines only as directed by your health care provider.  If you were prescribed an antibiotic ointment, finish all of it even if you start to feel better. Wound Care  Clean the wound with mild soap and water 2-3 times per day or as directed by your health care provider. Pat your wound dry with a clean towel. Do not rub it.  There are many different ways to close and cover a  wound. Follow instructions from your health  care provider about:  Wound care.  Dressing changes and removal.  Check your wound every day for signs of infection. Watch for:  Redness, swelling, or pain.  Fluid, blood, or pus. General Instructions  Keep the dressing dry as directed by your health care provider. Do not take baths, swim, use a hot tub, or do anything that would put your wound underwater until your health care provider approves.  If there is swelling, raise (elevate) the injured area above the level of your heart while you are sitting or lying down.  Keep all follow-up visits as directed by your health care provider. This is important. SEEK MEDICAL CARE IF:  You received a tetanus shot and you have swelling, severe pain, redness, or bleeding at the injection site.  Your pain is not controlled with medicine.  You have increased redness, swelling, or pain at the site of your wound. SEEK IMMEDIATE MEDICAL CARE IF:  You have a red streak going away from your wound.  You have a fever.  You have fluid, blood, or pus coming from your wound.  You notice a bad smell coming from your wound or your dressing.   This information is not intended to replace advice given to you by your health care provider. Make sure you discuss any questions you have with your health care provider.   Document Released: 09/19/2005 Document Revised: 08/31/2015 Document Reviewed: 12/08/2014 Elsevier Interactive Patient Education [employer].
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
## 2016-06-21 ENCOUNTER — Telehealth: Payer: Self-pay

## 2016-06-21 MED ORDER — ALPRAZOLAM 1 MG PO TABS: ORAL_TABLET | ORAL | Status: DC

## 2016-06-21 NOTE — Telephone Encounter (Signed)
Done hardcopy to [name]  

## 2016-06-21 NOTE — Addendum Note (Signed)
Addended by: Biagio Borg on: 06/21/2016 06:16 PM   Modules accepted: Orders

## 2016-06-21 NOTE — Telephone Encounter (Signed)
Please advise patient is requesting refill on xanax

## 2016-06-22 NOTE — Telephone Encounter (Signed)
Medication faxed to pharmacy 

## 2016-07-30 ENCOUNTER — Other Ambulatory Visit: Payer: Self-pay

## 2016-07-30 NOTE — Telephone Encounter (Signed)
rx rq received via fax for zolpidem 10 mg.

## 2016-07-31 ENCOUNTER — Ambulatory Visit: Payer: Medicare HMO | Admitting: Internal Medicine

## 2016-07-31 MED ORDER — ZOLPIDEM TARTRATE 10 MG PO TABS: 10.0000 mg | ORAL_TABLET | Freq: Every evening | ORAL | 5 refills | Status: DC | PRN

## 2016-07-31 NOTE — Telephone Encounter (Signed)
Medication refill sent to pharmacy  

## 2016-07-31 NOTE — Telephone Encounter (Signed)
Done hardcopy to [name]  

## 2016-08-01 ENCOUNTER — Telehealth: Payer: Self-pay | Admitting: Emergency Medicine

## 2016-08-01 NOTE — Telephone Encounter (Signed)
Ok to hold off on this for now

## 2016-08-01 NOTE — Telephone Encounter (Signed)
Mr Evan [name] missed his medication refill appt yesterday 8/8. Would you like me to call and reschedule that appt?

## 2016-10-03 DIAGNOSIS — F988 Other specified behavioral and emotional disorders with onset usually occurring in childhood and adolescence: Secondary | ICD-10-CM | POA: Insufficient documentation

## 2016-10-03 DIAGNOSIS — F10121 Alcohol abuse with intoxication delirium: Secondary | ICD-10-CM | POA: Insufficient documentation

## 2016-10-31 ENCOUNTER — Ambulatory Visit (INDEPENDENT_AMBULATORY_CARE_PROVIDER_SITE_OTHER): Payer: Medicare HMO | Admitting: Internal Medicine

## 2016-10-31 ENCOUNTER — Encounter: Payer: Self-pay | Admitting: Internal Medicine

## 2016-10-31 VITALS — BP 140/80 | HR 95 | Temp 98.0°F | Resp 20 | Wt 235.0 lb

## 2016-10-31 DIAGNOSIS — R059 Cough, unspecified: Secondary | ICD-10-CM | POA: Insufficient documentation

## 2016-10-31 DIAGNOSIS — R062 Wheezing: Secondary | ICD-10-CM | POA: Insufficient documentation

## 2016-10-31 DIAGNOSIS — F909 Attention-deficit hyperactivity disorder, unspecified type: Secondary | ICD-10-CM

## 2016-10-31 DIAGNOSIS — R05 Cough: Secondary | ICD-10-CM

## 2016-10-31 MED ORDER — LEVOFLOXACIN 500 MG PO TABS: 500.0000 mg | ORAL_TABLET | Freq: Every day | ORAL | 0 refills | Status: AC

## 2016-10-31 MED ORDER — HYDROCOD POLST-CPM POLST ER 10-8 MG/5ML PO SUER: 5.0000 mL | Freq: Two times a day (BID) | ORAL | 0 refills | Status: DC | PRN

## 2016-10-31 MED ORDER — AMPHETAMINE-DEXTROAMPHETAMINE 30 MG PO TABS
30.0000 mg | ORAL_TABLET | Freq: Three times a day (TID) | ORAL | 0 refills | Status: DC
Start: 2016-10-31 — End: 2017-02-05

## 2016-10-31 MED ORDER — AMPHETAMINE-DEXTROAMPHETAMINE 30 MG PO TABS: 30.0000 mg | ORAL_TABLET | Freq: Three times a day (TID) | ORAL | 0 refills | Status: DC

## 2016-10-31 NOTE — Progress Notes (Signed)
Subjective:    Patient ID: Evan [name], male    DOB: 1971-01-22, 45 y.o.   MRN: [id number]  HPI  Here with acute onset mild to mod 2-3 days ST, HA, general weakness and malaise, with prod cough greenish sputum, but Pt denies chest pain, Sob, orthopnea, PND, increased LE swelling, palpitations, dizziness or syncope., except for mild wheezing since last pm.  ADD med working well, able to function socially and work.  Pt denies new neurological symptoms such as new headache, or facial or extremity weakness or numbness   Pt denies polydipsia, polyuria Past Medical History:  Diagnosis Date  . ADD (attention deficit disorder) 05/11/2013  . ANXIETY 12/10/2007  . BACK PAIN 08/03/2009  . Chronic pain syndrome 01/28/2014  . Depression 06/15/2014  . ERECTILE DYSFUNCTION 12/10/2007  . Hepatitis C 05/15/2013   Genotype 1a, Il-28 genotype CC, pt declined interferon June 2013  . HYPERTENSION 11/21/2007  . INSOMNIA-SLEEP DISORDER-UNSPEC 12/20/2008  . Melanoma ([place])   . 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 Preventative health care 05/14/2011   Past Surgical History:  Procedure Laterality Date  . SHOULDER SURGERY     chronic L dislocating     reports that he has quit smoking. He does not have any smokeless tobacco history on file. He reports that he drinks alcohol. He reports that he does not use drugs. family history includes Cancer in his father and other. Allergies  Allergen Reactions  . Contrast Media [Iodinated Diagnostic Agents] Shortness Of Breath    Shortness of breath., throat swelling  . Penicillins Anaphylaxis and Other (See Comments)    Has patient had a PCN reaction causing immediate rash, facial/tongue/throat swelling, SOB or lightheadedness with hypotension: Yes Has patient had a PCN reaction causing severe rash involving mucus membranes or skin necrosis: Yes Has patient had a PCN reaction that required hospitalization Yes Has patient had a PCN reaction occurring within  the last 10 years: No If all of the above answers are "NO", then may proceed with Cephalosporin use.   . Azithromycin Nausea And Vomiting  . Other Swelling    seafood   Current Outpatient Prescriptions on File Prior to Visit  Medication Sig Dispense Refill  . ALPRAZolam (XANAX) 1 MG tablet TAKE ONE TABLET IN THE MORNING AND 1 OR 2 TABLETS IN THE EVENING -LIMIT 3 TABLETS PER DAY 90 tablet 5  . oxyCODONE (ROXICODONE) 15 MG immediate release tablet 1 tab by mouth every 4-6 hrs as needed for pain, with maximum 4 tabs per day - to fill Mar 28, 2014 (Patient taking differently: Take 15 mg by mouth every 6 (six) hours as needed for pain. 1 tab by mouth every 4-6 hrs as needed for pain, with maximum 4 tabs per day - to fill Mar 28, 2014) 120 tablet 0  . sildenafil (VIAGRA) 100 MG tablet TAKE ONE TABLET BY MOUTH ONCE A DAY AS NEEDED (Patient taking differently: Take 100 mg by mouth as needed for erectile dysfunction. TAKE ONE TABLET BY MOUTH ONCE A DAY AS NEEDED) 6 tablet 11  . zolpidem (AMBIEN) 10 MG tablet Take 1 tablet (10 mg total) by mouth at bedtime as needed for sleep. 30 tablet 5   No current facility-administered medications on file prior to visit.     Review of Systems  Constitutional: Negative for unusual diaphoresis or night sweats HENT: Negative for ear swelling or discharge Eyes: Negative for worsening visual haziness  Respiratory: Negative for choking and stridor.   Gastrointestinal: Negative  for distension or worsening eructation Genitourinary: Negative for retention or change in urine volume.  Musculoskeletal: Negative for other MSK pain or swelling Skin: Negative for color change and worsening wound Neurological: Negative for tremors and numbness other than noted  Psychiatric/Behavioral: Negative for decreased concentration or agitation other than above   All other system neg    Objective:   Physical Exam BP 140/80   Pulse 95   Temp 98 F (36.7 C) (Oral)   Resp 20   Wt 235  lb (106.6 kg)   SpO2 96%   BMI 29.37 kg/m  VS noted, mild ill Constitutional: Pt appears in no apparent distress HENT: Head: NCAT.  Right Ear: External ear normal.  Left Ear: External ear normal.  Eyes: . Pupils are equal, round, and reactive to light. Conjunctivae and EOM are normal Bilat tm's with mild erythema.  Max sinus areas non tender.  Pharynx with mild erythema, no exudate Neck: Normal range of motion. Neck supple.  Cardiovascular: Normal rate and regular rhythm.   Pulmonary/Chest: Effort normal and breath sounds decresed without rales but few scattered wheezing.  Neurological: Pt is alert. Not confused , motor grossly intact Skin: Skin is warm. No rash, no LE edema Psychiatric: Pt behavior is normal. No agitation.  No other significant exam findings    Assessment & Plan:

## 2016-10-31 NOTE — Assessment & Plan Note (Signed)
Mild by exam, asympt, decliens inhaler or steroid tx,  to f/u any worsening symptoms or concerns

## 2016-10-31 NOTE — Patient Instructions (Addendum)
Please take all new medication as prescribed - the antibiotic, and cough medicine  Please continue all other medications as before, and refills have been done if requested  - the adderall  Please have the pharmacy call with any other refills you may need..  Please keep your appointments with your specialists as you may have planned

## 2016-10-31 NOTE — Progress Notes (Signed)
Pre visit review using our clinic review tool, if applicable. No additional management support is needed unless otherwise documented below in the visit note. 

## 2016-10-31 NOTE — Assessment & Plan Note (Signed)
Mild to mod, for med refills,  to f/u any worsening symptoms or concerns

## 2016-10-31 NOTE — Assessment & Plan Note (Signed)
Mild to mod, for antibx course,  to f/u any worsening symptoms or concerns 

## 2017-01-21 ENCOUNTER — Other Ambulatory Visit: Payer: Self-pay | Admitting: Internal Medicine

## 2017-01-21 NOTE — Telephone Encounter (Signed)
Routing to dr john---please advise, thanks 

## 2017-01-22 NOTE — Telephone Encounter (Signed)
Done hardcopy to [name]  

## 2017-01-22 NOTE — Telephone Encounter (Signed)
faxed

## 2017-02-04 ENCOUNTER — Telehealth: Payer: Self-pay | Admitting: *Deleted

## 2017-02-04 NOTE — Telephone Encounter (Signed)
[place] controlled substance database checked.  Ok to fill medication.  

## 2017-02-04 NOTE — Telephone Encounter (Signed)
Left msg on triage requesting refill on his Adderrall. MD out of office this week pls advise...Evan [name]

## 2017-02-05 MED ORDER — AMPHETAMINE-DEXTROAMPHETAMINE 30 MG PO TABS: 30.0000 mg | ORAL_TABLET | Freq: Three times a day (TID) | ORAL | 0 refills | Status: DC

## 2017-02-05 NOTE — Telephone Encounter (Signed)
Called pt no answer LMOM (c) rx ready for pick-up...Evan [name]

## 2017-03-12 ENCOUNTER — Telehealth: Payer: Self-pay | Admitting: *Deleted

## 2017-03-12 MED ORDER — AMPHETAMINE-DEXTROAMPHETAMINE 30 MG PO TABS: 30.0000 mg | ORAL_TABLET | Freq: Three times a day (TID) | ORAL | 0 refills | Status: DC

## 2017-03-12 NOTE — Telephone Encounter (Signed)
Notified pt rx's ready for pick-up../lmb 

## 2017-03-12 NOTE — Telephone Encounter (Signed)
Done hardcopy to [place]  

## 2017-03-12 NOTE — Telephone Encounter (Signed)
Rec'd call pt requesting 3 months scripts on his adderrall...Evan [name]

## 2017-03-13 ENCOUNTER — Other Ambulatory Visit: Payer: Self-pay | Admitting: Internal Medicine

## 2017-03-13 NOTE — Telephone Encounter (Signed)
Done hardcopy to [place]  

## 2017-05-24 ENCOUNTER — Telehealth: Payer: Self-pay | Admitting: Internal Medicine

## 2017-05-24 MED ORDER — AMPHETAMINE-DEXTROAMPHETAMINE 30 MG PO TABS: 30.0000 mg | ORAL_TABLET | Freq: Three times a day (TID) | ORAL | 0 refills | Status: DC

## 2017-05-24 NOTE — Telephone Encounter (Signed)
Informed pt Script at front desk  
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
## 2017-05-24 NOTE — Telephone Encounter (Signed)
Done hardcopy to [place]  

## 2017-06-25 ENCOUNTER — Ambulatory Visit: Payer: Medicare HMO | Admitting: Internal Medicine

## 2017-06-25 ENCOUNTER — Telehealth: Payer: Self-pay | Admitting: Internal Medicine

## 2017-06-25 MED ORDER — AMPHETAMINE-DEXTROAMPHETAMINE 30 MG PO TABS: 30.0000 mg | ORAL_TABLET | Freq: Three times a day (TID) | ORAL | 0 refills | Status: DC

## 2017-06-25 NOTE — Telephone Encounter (Signed)
Done hardcopy to [place]  
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
## 2017-06-25 NOTE — Telephone Encounter (Signed)
Pt had an appointment scheduled for today at 3:15pm. He got here at 3:45. He called when he was walking in. I told him that he would not be able to be seen today and we rescheduled his appointment to Thursday. He said that he is completely out of his Adderall and wanted to know if this could be filled. I told him that I did not know but that I would make you aware of the situation. He can be reached at [phone]. I believe he is in the parking lot.Marland Kitchen

## 2017-06-27 ENCOUNTER — Encounter: Payer: Self-pay | Admitting: Internal Medicine

## 2017-06-27 ENCOUNTER — Ambulatory Visit (INDEPENDENT_AMBULATORY_CARE_PROVIDER_SITE_OTHER): Payer: Self-pay | Admitting: Internal Medicine

## 2017-06-27 VITALS — BP 162/90 | HR 95 | Ht 75.0 in | Wt 225.0 lb

## 2017-06-27 DIAGNOSIS — I10 Essential (primary) hypertension: Secondary | ICD-10-CM

## 2017-06-27 DIAGNOSIS — F909 Attention-deficit hyperactivity disorder, unspecified type: Secondary | ICD-10-CM

## 2017-06-27 DIAGNOSIS — F411 Generalized anxiety disorder: Secondary | ICD-10-CM

## 2017-06-27 MED ORDER — ALPRAZOLAM 1 MG PO TABS: ORAL_TABLET | ORAL | 5 refills | Status: DC

## 2017-06-27 MED ORDER — PAROXETINE HCL 10 MG PO TABS: 10.0000 mg | ORAL_TABLET | Freq: Every day | ORAL | 3 refills | Status: DC

## 2017-06-27 MED ORDER — AMPHETAMINE-DEXTROAMPHETAMINE 30 MG PO TABS: 30.0000 mg | ORAL_TABLET | Freq: Three times a day (TID) | ORAL | 0 refills | Status: DC

## 2017-06-27 NOTE — Assessment & Plan Note (Signed)
stable overall by history and exam, and pt to continue medical treatment as before,  to f/u any worsening symptoms or concerns 

## 2017-06-27 NOTE — Progress Notes (Signed)
Subjective:    Patient ID: Evan [name], male    DOB: [birth year], 46 y.o.   MRN: [id number]  HPI  Here to f/u; overall doing ok,  Pt denies chest pain, increasing sob or doe, wheezing, orthopnea, PND, increased LE swelling, palpitations, dizziness or syncope.  Pt denies new neurological symptoms such as new headache, or facial or extremity weakness or numbness.  Pt denies polydipsia, polyuria.  Denies worsening depressive symptoms, suicidal ideation, or panic; has ongoing anxiety, not increased recently but with requiring high dose benzo.  Willing this time to add SSRI.  Has lost wt from last visit but gained overall Wt Readings from Last 3 Encounters:  06/27/17 225 lb (102.1 kg)  10/31/16 235 lb (106.6 kg)  01/27/16 207 lb (93.9 kg)  Cont's to f/u with [facility] oncology, now 4 yrs free of recurrent melanoma. Also pt states Hep C RNA negative about 6 mo ago.   BP Readings from Last 3 Encounters:  06/27/17 (!) 162/90  10/31/16 140/80  05/19/16 140/86   Past Medical History:  Diagnosis Date  . ADD (attention deficit disorder) 05/11/2013  . ANXIETY 12/10/2007  . BACK PAIN 08/03/2009  . Chronic pain syndrome 01/28/2014  . Depression 06/15/2014  . ERECTILE DYSFUNCTION 12/10/2007  . Hepatitis C 05/15/2013   Genotype 1a, Il-28 genotype CC, pt declined interferon June 2013  . HYPERTENSION 11/21/2007  . INSOMNIA-SLEEP DISORDER-UNSPEC 12/20/2008  . Melanoma ([place])   . 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 Preventative health care 05/14/2011   Past Surgical History:  Procedure Laterality Date  . SHOULDER SURGERY     chronic L dislocating     reports that he has quit smoking. He has never used smokeless tobacco. He reports that he drinks alcohol. He reports that he does not use drugs. family history includes Cancer in his father and other. Allergies  Allergen Reactions  . Contrast Media [Iodinated Diagnostic Agents] Shortness Of Breath    Shortness of breath., throat  swelling  . Penicillins Anaphylaxis and Other (See Comments)    Has patient had a PCN reaction causing immediate rash, facial/tongue/throat swelling, SOB or lightheadedness with hypotension: Yes Has patient had a PCN reaction causing severe rash involving mucus membranes or skin necrosis: Yes Has patient had a PCN reaction that required hospitalization Yes Has patient had a PCN reaction occurring within the last 10 years: No If all of the above answers are "NO", then may proceed with Cephalosporin use.   . Azithromycin Nausea And Vomiting  . Other Swelling    seafood   Current Outpatient Prescriptions on File Prior to Visit  Medication Sig Dispense Refill  . oxyCODONE (ROXICODONE) 15 MG immediate release tablet 1 tab by mouth every 4-6 hrs as needed for pain, with maximum 4 tabs per day - to fill Mar 28, 2014 (Patient taking differently: Take 15 mg by mouth every 6 (six) hours as needed for pain. 1 tab by mouth every 4-6 hrs as needed for pain, with maximum 4 tabs per day - to fill Mar 28, 2014) 120 tablet 0  . sildenafil (VIAGRA) 100 MG tablet TAKE ONE TABLET BY MOUTH ONCE A DAY AS NEEDED (Patient taking differently: Take 100 mg by mouth as needed for erectile dysfunction. TAKE ONE TABLET BY MOUTH ONCE A DAY AS NEEDED) 6 tablet 11  . zolpidem (AMBIEN) 10 MG tablet TAKE ONE TABLET AT BEDTIME AS NEEDED FOR SLEEP 30 tablet 5   No current facility-administered medications on file prior  to visit.    Review of Systems  Constitutional: Negative for other unusual diaphoresis or sweats HENT: Negative for ear discharge or swelling Eyes: Negative for other worsening visual disturbances Respiratory: Negative for stridor or other swelling  Gastrointestinal: Negative for worsening distension or other blood Genitourinary: Negative for retention or other urinary change Musculoskeletal: Negative for other MSK pain or swelling Skin: Negative for color change or other new lesions Neurological: Negative for  worsening tremors and other numbness  Psychiatric/Behavioral: Negative for worsening agitation or other fatigue All other system neg per pt    Objective:   Physical Exam BP (!) 162/90   Pulse 95   Ht 6\' 3"  (1.905 m)   Wt 225 lb (102.1 kg)   SpO2 99%   BMI 28.12 kg/m  VS noted, not ill appearing Constitutional: Pt appears in NAD HENT: Head: NCAT.  Right Ear: External ear normal.  Left Ear: External ear normal.  Eyes: . Pupils are equal, round, and reactive to light. Conjunctivae and EOM are normal Nose: without d/c or deformity Neck: Neck supple. Gross normal ROM Cardiovascular: Normal rate and regular rhythm.   Pulmonary/Chest: Effort normal and breath sounds without rales or wheezing.  Abd:  Soft, NT, ND, + BS, no organomegaly Neurological: Pt is alert. At baseline orientation, motor grossly intact Skin: Skin is warm. No rashes, other new lesions, no LE edema Psychiatric: Pt behavior is normal without agitation , 1-2+ nervous No other exam findings    Assessment & Plan:

## 2017-06-27 NOTE — Assessment & Plan Note (Signed)
BP Readings from Last 3 Encounters:  06/27/17 (!) 162/90  10/31/16 140/80  05/19/16 140/86  uncontrolled today, declines any med change in favor of working on lifestyle And wt control,  to f/u any worsening symptoms or concerns\

## 2017-06-27 NOTE — Assessment & Plan Note (Signed)
Chronic persistent, I declined increase to qid dosing, to add paxil 10 qd

## 2017-06-27 NOTE — Telephone Encounter (Signed)
Will give to pt when he comes in for appt today at 5.15

## 2017-06-27 NOTE — Patient Instructions (Signed)
Please take all new medication as prescribed - the paxil at 10 mg per day  Please continue all other medications as before, and refills have been done if requested - the adderall  Please have the pharmacy call with any other refills you may need.  Please keep your appointments with your specialists as you may have planned

## 2017-07-20 ENCOUNTER — Telehealth: Payer: Self-pay | Admitting: Internal Medicine

## 2017-07-22 NOTE — Telephone Encounter (Signed)
Refill denied too soon. Check [place] registry last filled 06/29/2017, not due until 07/30/17...Evan [name]

## 2017-07-24 NOTE — Telephone Encounter (Signed)
Patient has called in requesting script to be sent to pharmacy with fill date of 8/7 to avoid a possible lapse of medication.

## 2017-07-24 NOTE — Addendum Note (Signed)
Addended by: Biagio Borg on: 07/24/2017 01:11 PM   Modules accepted: Orders

## 2017-07-24 NOTE — Telephone Encounter (Signed)
Xanax still too soon, since last rx was done July 5 for total 6 mo

## 2017-07-30 ENCOUNTER — Telehealth: Payer: Self-pay | Admitting: Internal Medicine

## 2017-07-30 NOTE — Telephone Encounter (Signed)
Pt called stating that he can not find his written scripts for amphetamine-dextroamphetamine (ADDERALL). He was holding on to them for the next months refills and now he does not know where they are. He asked if a new script could be written.

## 2017-07-30 NOTE — Telephone Encounter (Signed)
Very sorry, I cannot replace scripts for controlled substances

## 2017-07-31 NOTE — Telephone Encounter (Addendum)
Patient called back.  I gave him Dr. Gwynn Burly response. Patient states he thought that written scripts could be "cancelled".  Patient states that pharmacy would not hold onto his scripts and he has lost them.   Patient wanted to know what to do in regard if he ran out of medication.  I suggested urgent care or ED.  Patient states he will send Dr. Jenny Reichmann a my chart message.

## 2017-07-31 NOTE — Telephone Encounter (Signed)
Routing back to shirron---please call patient in case he has further questions about how to resolve this with dr john---not sure if dr Jenny Reichmann would agree to another office visit or some other way for patient to obtain rx, or in case he has clinical questions that scheduling can't answer

## 2017-07-31 NOTE — Telephone Encounter (Signed)
I dont have anything else to offer at this time, sorry

## 2017-07-31 NOTE — Telephone Encounter (Signed)
Called pt, LVM.   Unfortunately at this time we can not give any additional scripts or copies per JJ. He will be eligible for another script in October when his next refill is due.   Tax inspector Sidenote; Dewitt Hoes has given the pt at least 2 copies of scripts over the last 2-3 months for "misplacing/losing" them.)

## 2017-09-14 ENCOUNTER — Other Ambulatory Visit: Payer: Self-pay | Admitting: Internal Medicine

## 2017-09-17 NOTE — Telephone Encounter (Signed)
Faxed

## 2017-09-17 NOTE — Telephone Encounter (Signed)
Done hardcopy to [place]  

## 2017-09-30 ENCOUNTER — Telehealth: Payer: Self-pay | Admitting: Internal Medicine

## 2017-09-30 MED ORDER — AMPHETAMINE-DEXTROAMPHETAMINE 30 MG PO TABS: 30.0000 mg | ORAL_TABLET | Freq: Three times a day (TID) | ORAL | 0 refills | Status: DC

## 2017-09-30 NOTE — Telephone Encounter (Signed)
Patient states that he had a script for July, August and September.  Patient states he did not have script for October. Please follow up with patient in regard at [phone].

## 2017-09-30 NOTE — Telephone Encounter (Signed)
Check [place] registry last filled 09/19/2017...not due until 10/19/17 since MD is out of office pt can wait to get refill when its time for renewal.../lmb

## 2017-09-30 NOTE — Telephone Encounter (Signed)
Pt called requesting a refill on amphetamine-dextroamphetamine (ADDERALL) 30 MG tablet. He said that the last scripts that he thought he lost, were found and he was able to get them filled. He would like a call to let him know when they are ready at [phone].

## 2017-09-30 NOTE — Telephone Encounter (Signed)
Done hardcopy to shirron x 3

## 2017-09-30 NOTE — Addendum Note (Signed)
Addended by: Biagio Borg on: 09/30/2017 05:25 PM   Modules accepted: Orders

## 2017-10-01 ENCOUNTER — Other Ambulatory Visit: Payer: Self-pay | Admitting: Internal Medicine

## 2017-10-01 MED ORDER — AMPHETAMINE-DEXTROAMPHETAMINE 30 MG PO TABS: 30.0000 mg | ORAL_TABLET | Freq: Three times a day (TID) | ORAL | 0 refills | Status: DC

## 2017-10-01 NOTE — Telephone Encounter (Signed)
Notified patient of pickup

## 2017-10-01 NOTE — Telephone Encounter (Signed)
rx redone as original lost

## 2017-11-05 ENCOUNTER — Other Ambulatory Visit: Payer: Self-pay

## 2017-11-05 ENCOUNTER — Encounter (HOSPITAL_COMMUNITY): Payer: Self-pay | Admitting: Emergency Medicine

## 2017-11-05 ENCOUNTER — Ambulatory Visit (HOSPITAL_COMMUNITY)
Admission: EM | Admit: 2017-11-05 | Discharge: 2017-11-05 | Disposition: A | Discharge status: Home / Self Care | Payer: Self-pay | Attending: Emergency Medicine | Admitting: Emergency Medicine

## 2017-11-05 DIAGNOSIS — M545 Low back pain, unspecified: Secondary | ICD-10-CM

## 2017-11-05 DIAGNOSIS — G44209 Tension-type headache, unspecified, not intractable: Secondary | ICD-10-CM

## 2017-11-05 MED ORDER — METHOCARBAMOL 500 MG PO TABS: 500.0000 mg | ORAL_TABLET | Freq: Two times a day (BID) | ORAL | 0 refills | Status: DC

## 2017-11-05 MED ORDER — IBUPROFEN 800 MG PO TABS: 800.0000 mg | ORAL_TABLET | Freq: Three times a day (TID) | ORAL | 0 refills | Status: DC

## 2017-11-05 NOTE — Discharge Instructions (Signed)
May use ice and heat as needed For headache use motrin as needed if symptoms become persistent and the worse you ever had go to the ER
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
## 2017-12-18 ENCOUNTER — Telehealth: Payer: Self-pay | Admitting: Internal Medicine

## 2017-12-18 MED ORDER — ALPRAZOLAM 1 MG PO TABS: ORAL_TABLET | ORAL | 5 refills | Status: DC

## 2017-12-18 NOTE — Telephone Encounter (Signed)
Copied from [place] [id number]. Topic: Quick Communication - Rx Refill/Question >> Dec 18, 2017  4:04 PM Malena Catholic I, NT wrote: Has the patient contacted their pharmacy yes   (Agent: If no, request that the patient contact the pharmacy for the refill Xanax 1 MG   Preferred Pharmacy (with phone number or street name [facility] [phone]    Agent: Please be advised that RX refills may take up to 3 business days. We ask that you follow-up with your pharmacy.

## 2017-12-18 NOTE — Telephone Encounter (Signed)
Done erx 

## 2017-12-19 MED ORDER — ALPRAZOLAM 1 MG PO TABS: ORAL_TABLET | ORAL | 5 refills | Status: DC

## 2017-12-19 NOTE — Telephone Encounter (Addendum)
Per chart rx was sent to [facility] of [facility]. [facility] spoke w/pharmacist cancel script then called [facility] spoke w/pharmacist Adasia gave verbal for alprazolam. Per pharmacist can only get 5 refills within 6 months period. Need rx change to reflect...Evan [name]

## 2017-12-19 NOTE — Telephone Encounter (Signed)
Review for Rx resend

## 2017-12-19 NOTE — Telephone Encounter (Signed)
Pt. Given message that Xanax was sent  To [facility] on [address].

## 2017-12-19 NOTE — Telephone Encounter (Signed)
Called pt no answer LMOM rx sent to [facility].../lmb 

## 2017-12-19 NOTE — Telephone Encounter (Signed)
Pt said [facility] does not have the script. Please re send if you can. thanks

## 2017-12-19 NOTE — Telephone Encounter (Signed)
Pt would like to have Rx sent to : [facility] instead.   Spoke with Triage, will forward message to have Rx sent to correct pharmacy.

## 2017-12-19 NOTE — Addendum Note (Signed)
Addended by: Biagio Borg on: 12/19/2017 05:16 PM   Modules accepted: Orders

## 2017-12-19 NOTE — Telephone Encounter (Signed)
Done erx 

## 2018-01-02 ENCOUNTER — Other Ambulatory Visit: Payer: Self-pay | Admitting: Internal Medicine

## 2018-01-02 MED ORDER — AMPHETAMINE-DEXTROAMPHETAMINE 30 MG PO TABS: 30.0000 mg | ORAL_TABLET | Freq: Three times a day (TID) | ORAL | 0 refills | Status: DC

## 2018-01-02 NOTE — Addendum Note (Signed)
Addended by: Biagio Borg on: 01/02/2018 05:13 PM   Modules accepted: Orders

## 2018-01-02 NOTE — Telephone Encounter (Signed)
Done erx 

## 2018-01-02 NOTE — Telephone Encounter (Signed)
Copied from [place] [id number]. Topic: Quick Communication - See Telephone Encounter >> Jan 02, 2018 10:48 AM Evan [name], NT wrote: CRM for notification. See Telephone encounter for:  01/02/18.  Patient has an appt for 01/06/18 for medication refill. He is requesting his adderall refilled before then. Please advise.   [facility]

## 2018-01-02 NOTE — Telephone Encounter (Addendum)
Notified pt [facility] has been [facility], but pt states he has to get his [facility]all from [facility], all other meds he can get at [facility]. Pls resend rx to [facility] [address]...Evan [name]

## 2018-01-06 ENCOUNTER — Ambulatory Visit: Payer: Self-pay | Admitting: Internal Medicine

## 2018-01-27 ENCOUNTER — Ambulatory Visit: Payer: Self-pay | Admitting: Internal Medicine

## 2018-01-27 DIAGNOSIS — Z0289 Encounter for other administrative examinations: Secondary | ICD-10-CM

## 2018-01-28 ENCOUNTER — Telehealth: Payer: Self-pay | Admitting: Internal Medicine

## 2018-01-28 MED ORDER — AMPHETAMINE-DEXTROAMPHETAMINE 30 MG PO TABS: 30.0000 mg | ORAL_TABLET | Freq: Three times a day (TID) | ORAL | 0 refills | Status: DC

## 2018-01-28 NOTE — Telephone Encounter (Signed)
Done erx  Due for refill Feb 9 only
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
## 2018-01-29 ENCOUNTER — Telehealth: Payer: Self-pay

## 2018-01-29 ENCOUNTER — Ambulatory Visit: Payer: Self-pay | Admitting: Internal Medicine

## 2018-01-29 MED ORDER — AMPHETAMINE-DEXTROAMPHETAMINE 30 MG PO TABS: 30.0000 mg | ORAL_TABLET | Freq: Three times a day (TID) | ORAL | 0 refills | Status: DC

## 2018-01-29 NOTE — Telephone Encounter (Signed)
Copied from [place] [id number]. Topic: Quick Communication - See Telephone Encounter >> Jan 29, 2018  9:30 AM Hewitt Shorts wrote: CRM for notification. See Telephone encounter for:  Pt is calling to check on status of his adderall rx -he states that [facility] should [facility] -pt has no insurance and so they do not take Good RX card at [facility]  Best number [phone]  01/29/18.

## 2018-01-29 NOTE — Telephone Encounter (Signed)
Ok, this is done 

## 2018-01-29 NOTE — Addendum Note (Signed)
Addended by: Biagio Borg on: 01/29/2018 12:52 PM   Modules accepted: Orders

## 2018-01-30 ENCOUNTER — Ambulatory Visit: Payer: Self-pay | Admitting: Internal Medicine

## 2018-01-31 ENCOUNTER — Encounter: Payer: Self-pay | Admitting: Internal Medicine

## 2018-02-04 ENCOUNTER — Encounter: Payer: Self-pay | Admitting: Internal Medicine

## 2018-02-04 ENCOUNTER — Ambulatory Visit (INDEPENDENT_AMBULATORY_CARE_PROVIDER_SITE_OTHER): Payer: Self-pay | Admitting: Internal Medicine

## 2018-02-04 VITALS — BP 118/82 | HR 85 | Temp 98.4°F | Ht 75.0 in | Wt 210.0 lb

## 2018-02-04 DIAGNOSIS — F32A Depression, unspecified: Secondary | ICD-10-CM

## 2018-02-04 DIAGNOSIS — F411 Generalized anxiety disorder: Secondary | ICD-10-CM

## 2018-02-04 DIAGNOSIS — I10 Essential (primary) hypertension: Secondary | ICD-10-CM

## 2018-02-04 DIAGNOSIS — F329 Major depressive disorder, single episode, unspecified: Secondary | ICD-10-CM

## 2018-02-04 MED ORDER — AMPHETAMINE-DEXTROAMPHETAMINE 30 MG PO TABS: 30.0000 mg | ORAL_TABLET | Freq: Three times a day (TID) | ORAL | 0 refills | Status: DC

## 2018-02-04 MED ORDER — QUETIAPINE FUMARATE 100 MG PO TABS
100.0000 mg | ORAL_TABLET | Freq: Every day | ORAL | 1 refills | Status: DC
Start: 2018-02-04 — End: 2018-02-04

## 2018-02-04 MED ORDER — QUETIAPINE FUMARATE 100 MG PO TABS: 100.0000 mg | ORAL_TABLET | Freq: Every day | ORAL | 1 refills | Status: DC

## 2018-02-04 NOTE — Assessment & Plan Note (Signed)
stable overall by history and exam, recent data reviewed with pt, and pt to continue medical treatment as before,  to f/u any worsening symptoms or concerns BP Readings from Last 3 Encounters:  02/04/18 118/82  11/05/17 (!) 170/106  06/27/17 (!) 162/90

## 2018-02-04 NOTE — Progress Notes (Signed)
Subjective:    Patient ID: Evan [name], male    DOB: 12/12/1971, 47 y.o.   MRN: [id number]  HPI  Here to f/u, c/o worsening possibly manic depressive like symptoms, sometimes stays up all night, recalls he did better with seroquel in the past.  Denies worsening depressive symptoms, suicidal ideation, or panic. Pt denies chest pain, increased sob or doe, wheezing, orthopnea, PND, increased LE swelling, palpitations, dizziness or syncope. Past Medical History:  Diagnosis Date  . ADD (attention deficit disorder) 05/11/2013  . ANXIETY 12/10/2007  . BACK PAIN 08/03/2009  . Chronic pain syndrome 01/28/2014  . Depression 06/15/2014  . ERECTILE DYSFUNCTION 12/10/2007  . Hepatitis C 05/15/2013   Genotype 1a, Il-28 genotype CC, pt declined interferon June 2013  . HYPERTENSION 11/21/2007  . INSOMNIA-SLEEP DISORDER-UNSPEC 12/20/2008  . Melanoma ([place])   . 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 Preventative health care 05/14/2011   Past Surgical History:  Procedure Laterality Date  . SHOULDER SURGERY     chronic L dislocating     reports that he has quit smoking. he has never used smokeless tobacco. He reports that he drinks alcohol. He reports that he does not use drugs. family history includes Cancer in his father and other. Allergies  Allergen Reactions  . Contrast Media [Iodinated Diagnostic Agents] Shortness Of Breath    Shortness of breath., throat swelling  . Penicillins Anaphylaxis and Other (See Comments)    Has patient had a PCN reaction causing immediate rash, facial/tongue/throat swelling, SOB or lightheadedness with hypotension: Yes Has patient had a PCN reaction causing severe rash involving mucus membranes or skin necrosis: Yes Has patient had a PCN reaction that required hospitalization Yes Has patient had a PCN reaction occurring within the last 10 years: No If all of the above answers are "NO", then may proceed with Cephalosporin use.   . Other Swelling    Fish,  not shellfish   Current Outpatient Medications on File Prior to Visit  Medication Sig Dispense Refill  . ALPRAZolam (XANAX) 1 MG tablet TAKE ONE TABLET IN THE MORNING AND 1 OR 2 TABLETS IN THE EVENING -LIMIT 3 TABLETS PER DAY 90 tablet 5  . ibuprofen (ADVIL,MOTRIN) 800 MG tablet Take 1 tablet (800 mg total) 3 (three) times daily by mouth. 21 tablet 0  . methocarbamol (ROBAXIN) 500 MG tablet Take 1 tablet (500 mg total) 2 (two) times daily by mouth. 10 tablet 0  . oxyCODONE (ROXICODONE) 15 MG immediate release tablet 1 tab by mouth every 4-6 hrs as needed for pain, with maximum 4 tabs per day - to fill Mar 28, 2014 (Patient taking differently: Take 15 mg by mouth every 6 (six) hours as needed for pain. 1 tab by mouth every 4-6 hrs as needed for pain, with maximum 4 tabs per day - to fill Mar 28, 2014) 120 tablet 0  . PARoxetine (PAXIL) 10 MG tablet Take 1 tablet (10 mg total) by mouth daily. 90 tablet 3  . sildenafil (VIAGRA) 100 MG tablet TAKE ONE TABLET BY MOUTH ONCE A DAY AS NEEDED (Patient taking differently: Take 100 mg by mouth as needed for erectile dysfunction. TAKE ONE TABLET BY MOUTH ONCE A DAY AS NEEDED) 6 tablet 11  . zolpidem (AMBIEN) 10 MG tablet TAKE ONE TABLET AT BEDTIME AS NEEDED FOR SLEEP 30 tablet 5   No current facility-administered medications on file prior to visit.    Review of Systems All otherwise neg per pt    Objective:  Physical Exam BP 118/82   Pulse 85   Temp 98.4 F (36.9 C) (Oral)   Ht 6\' 3"  (1.905 m)   Wt 210 lb (95.3 kg)   SpO2 98%   BMI 26.25 kg/m  VS noted,  Constitutional: Pt appears in NAD HENT: Head: NCAT.  Right Ear: External ear normal.  Left Ear: External ear normal.  Eyes: . Pupils are equal, round, and reactive to light. Conjunctivae and EOM are normal Nose: without d/c or deformity Neck: Neck supple. Gross normal ROM Cardiovascular: Normal rate and regular rhythm.   Pulmonary/Chest: Effort normal and breath sounds without rales or  wheezing.  Neurological: Pt is alert. At baseline orientation, motor grossly intact Skin: Skin is warm. No rashes, other new lesions, no LE edema Psychiatric: Pt behavior is normal without agitation , mild nervous No other exam findings    Assessment & Plan:

## 2018-02-04 NOTE — Patient Instructions (Addendum)
Please take all new medication as prescribed - the seroquel  Please continue all other medications as before, and refills have been done if requested - the adderall  Please have the pharmacy call with any other refills you may need.  Please continue your efforts at being more active, low cholesterol diet, and weight control.  Please keep your appointments with your specialists as you may have planned

## 2018-02-04 NOTE — Assessment & Plan Note (Signed)
Ok to cont the paxil

## 2018-02-04 NOTE — Assessment & Plan Note (Signed)
Ok to add the seroquel 100 qhs for possible manic depressive illness

## 2018-02-07 ENCOUNTER — Telehealth: Payer: Self-pay | Admitting: Internal Medicine

## 2018-02-07 NOTE — Telephone Encounter (Signed)
Patient dismissed from [facility] by Cathlean Cower MD , effective February 04, 2018. Dismissal letter sent out by certified / registered mail.  daj

## 2018-03-05 NOTE — Telephone Encounter (Signed)
Certified dismissal letter returned as undeliverable, unclaimed, return to sender after three attempts by [employer] on March 05, 2018. Letter placed in another envelope and resent as 1st class mail which does not require a signature. daj

## 2018-03-24 ENCOUNTER — Other Ambulatory Visit: Payer: Self-pay | Admitting: Internal Medicine

## 2018-03-25 NOTE — Telephone Encounter (Signed)
Last filled 02/24/2018  30#

## 2019-01-15 ENCOUNTER — Emergency Department (HOSPITAL_COMMUNITY): Payer: Self-pay

## 2019-01-15 ENCOUNTER — Inpatient Hospital Stay (HOSPITAL_COMMUNITY)
Admission: EM | Admit: 2019-01-15 | Discharge: 2019-01-26 | DRG: 488 | Disposition: A | Discharge status: Home Health | Payer: Self-pay | Attending: Student | Admitting: Student

## 2019-01-15 ENCOUNTER — Encounter (HOSPITAL_COMMUNITY): Payer: Self-pay | Admitting: *Deleted

## 2019-01-15 ENCOUNTER — Other Ambulatory Visit: Payer: Self-pay

## 2019-01-15 DIAGNOSIS — S82141A Displaced bicondylar fracture of right tibia, initial encounter for closed fracture: Principal | ICD-10-CM | POA: Diagnosis present

## 2019-01-15 DIAGNOSIS — Z85820 Personal history of malignant melanoma of skin: Secondary | ICD-10-CM

## 2019-01-15 DIAGNOSIS — S82101A Unspecified fracture of upper end of right tibia, initial encounter for closed fracture: Secondary | ICD-10-CM

## 2019-01-15 DIAGNOSIS — I10 Essential (primary) hypertension: Secondary | ICD-10-CM | POA: Diagnosis present

## 2019-01-15 DIAGNOSIS — T1490XA Injury, unspecified, initial encounter: Secondary | ICD-10-CM

## 2019-01-15 DIAGNOSIS — G4700 Insomnia, unspecified: Secondary | ICD-10-CM | POA: Diagnosis present

## 2019-01-15 DIAGNOSIS — S0081XA Abrasion of other part of head, initial encounter: Secondary | ICD-10-CM | POA: Diagnosis present

## 2019-01-15 DIAGNOSIS — N529 Male erectile dysfunction, unspecified: Secondary | ICD-10-CM | POA: Diagnosis present

## 2019-01-15 DIAGNOSIS — Z809 Family history of malignant neoplasm, unspecified: Secondary | ICD-10-CM

## 2019-01-15 DIAGNOSIS — F329 Major depressive disorder, single episode, unspecified: Secondary | ICD-10-CM | POA: Diagnosis present

## 2019-01-15 DIAGNOSIS — Z419 Encounter for procedure for purposes other than remedying health state, unspecified: Secondary | ICD-10-CM

## 2019-01-15 DIAGNOSIS — R509 Fever, unspecified: Secondary | ICD-10-CM

## 2019-01-15 DIAGNOSIS — S83281A Other tear of lateral meniscus, current injury, right knee, initial encounter: Secondary | ICD-10-CM

## 2019-01-15 DIAGNOSIS — Z79899 Other long term (current) drug therapy: Secondary | ICD-10-CM

## 2019-01-15 DIAGNOSIS — B1920 Unspecified viral hepatitis C without hepatic coma: Secondary | ICD-10-CM | POA: Diagnosis present

## 2019-01-15 DIAGNOSIS — S82142A Displaced bicondylar fracture of left tibia, initial encounter for closed fracture: Secondary | ICD-10-CM

## 2019-01-15 DIAGNOSIS — G8918 Other acute postprocedural pain: Secondary | ICD-10-CM

## 2019-01-15 DIAGNOSIS — W052XXA Fall from non-moving motorized mobility scooter, initial encounter: Secondary | ICD-10-CM | POA: Diagnosis present

## 2019-01-15 DIAGNOSIS — D62 Acute posthemorrhagic anemia: Secondary | ICD-10-CM | POA: Diagnosis not present

## 2019-01-15 DIAGNOSIS — S83261A Peripheral tear of lateral meniscus, current injury, right knee, initial encounter: Secondary | ICD-10-CM | POA: Diagnosis present

## 2019-01-15 DIAGNOSIS — Z87891 Personal history of nicotine dependence: Secondary | ICD-10-CM

## 2019-01-15 DIAGNOSIS — G894 Chronic pain syndrome: Secondary | ICD-10-CM | POA: Diagnosis present

## 2019-01-15 DIAGNOSIS — F419 Anxiety disorder, unspecified: Secondary | ICD-10-CM | POA: Diagnosis present

## 2019-01-15 DIAGNOSIS — Z888 Allergy status to other drugs, medicaments and biological substances status: Secondary | ICD-10-CM

## 2019-01-15 DIAGNOSIS — F988 Other specified behavioral and emotional disorders with onset usually occurring in childhood and adolescence: Secondary | ICD-10-CM | POA: Diagnosis present

## 2019-01-15 DIAGNOSIS — S82151A Displaced fracture of right tibial tuberosity, initial encounter for closed fracture: Secondary | ICD-10-CM

## 2019-01-15 DIAGNOSIS — M25571 Pain in right ankle and joints of right foot: Secondary | ICD-10-CM

## 2019-01-15 DIAGNOSIS — S82141S Displaced bicondylar fracture of right tibia, sequela: Secondary | ICD-10-CM

## 2019-01-15 DIAGNOSIS — Z560 Unemployment, unspecified: Secondary | ICD-10-CM

## 2019-01-15 DIAGNOSIS — Z91041 Radiographic dye allergy status: Secondary | ICD-10-CM

## 2019-01-15 LAB — CBC WITH DIFFERENTIAL/PLATELET
ABS IMMATURE GRANULOCYTES: 0.02 10*3/uL (ref 0.00–0.07)
BASOS PCT: 0 %
Basophils Absolute: 0 10*3/uL (ref 0.0–0.1)
Eosinophils Absolute: 0.1 10*3/uL (ref 0.0–0.5)
Eosinophils Relative: 1 %
HCT: 45.2 % (ref 39.0–52.0)
Hemoglobin: 13.8 g/dL (ref 13.0–17.0)
Immature Granulocytes: 0 %
Lymphocytes Relative: 15 %
Lymphs Abs: 1.5 10*3/uL (ref 0.7–4.0)
MCH: 26.8 pg (ref 26.0–34.0)
MCHC: 30.5 g/dL (ref 30.0–36.0)
MCV: 87.8 fL (ref 80.0–100.0)
Monocytes Absolute: 0.5 10*3/uL (ref 0.1–1.0)
Monocytes Relative: 5 %
Neutro Abs: 7.8 10*3/uL — ABNORMAL HIGH (ref 1.7–7.7)
Neutrophils Relative %: 79 %
Platelets: 357 10*3/uL (ref 150–400)
RBC: 5.15 MIL/uL (ref 4.22–5.81)
RDW: 14.9 % (ref 11.5–15.5)
WBC: 9.8 10*3/uL (ref 4.0–10.5)
nRBC: 0 % (ref 0.0–0.2)

## 2019-01-15 LAB — BASIC METABOLIC PANEL
Anion gap: 9 (ref 5–15)
BUN: 17 mg/dL (ref 6–20)
CO2: 25 mmol/L (ref 22–32)
Calcium: 9.6 mg/dL (ref 8.9–10.3)
Chloride: 103 mmol/L (ref 98–111)
Creatinine, Ser: 0.97 mg/dL (ref 0.61–1.24)
GFR calc Af Amer: >60 mL/min (ref >60)
GFR calc non Af Amer: >60 mL/min (ref >60)
Glucose, Bld: 118 mg/dL — ABNORMAL HIGH (ref 70–99)
POTASSIUM: 4.4 mmol/L (ref 3.5–5.1)
Sodium: 137 mmol/L (ref 135–145)

## 2019-01-15 LAB — I-STAT CREATININE, ED: Creatinine, Ser: 1 mg/dL (ref 0.61–1.24)

## 2019-01-15 MED ORDER — METHOCARBAMOL 1000 MG/10ML IJ SOLN: 500.0000 mg | Freq: Four times a day (QID) | INTRAVENOUS | Status: DC | PRN

## 2019-01-15 MED ORDER — IOPAMIDOL (ISOVUE-370) INJECTION 76%
INTRAVENOUS | Status: AC
  Filled 2019-01-15: qty 100

## 2019-01-15 MED ORDER — METHOCARBAMOL 500 MG PO TABS
500.0000 mg | ORAL_TABLET | Freq: Four times a day (QID) | ORAL | Status: DC | PRN
  Administered 2019-01-17 – 2019-01-21 (×9): 500 mg via ORAL
  Filled 2019-01-15 (×9): qty 1

## 2019-01-15 MED ORDER — DIPHENHYDRAMINE HCL 50 MG/ML IJ SOLN
50.0000 mg | Freq: Once | INTRAMUSCULAR | Status: AC
  Administered 2019-01-15: 50 mg via INTRAVENOUS
  Filled 2019-01-15: qty 1

## 2019-01-15 MED ORDER — HYDROMORPHONE HCL 1 MG/ML IJ SOLN
1.0000 mg | Freq: Once | INTRAMUSCULAR | Status: AC
  Administered 2019-01-15: 1 mg via INTRAVENOUS
  Filled 2019-01-15: qty 1

## 2019-01-15 MED ORDER — ACETAMINOPHEN 650 MG RE SUPP: 650.0000 mg | Freq: Four times a day (QID) | RECTAL | Status: DC | PRN

## 2019-01-15 MED ORDER — ONDANSETRON HCL 4 MG/2ML IJ SOLN
4.0000 mg | Freq: Four times a day (QID) | INTRAMUSCULAR | Status: DC | PRN
  Administered 2019-01-22: 4 mg via INTRAVENOUS
  Filled 2019-01-15: qty 2

## 2019-01-15 MED ORDER — ONDANSETRON HCL 4 MG PO TABS: 4.0000 mg | ORAL_TABLET | Freq: Four times a day (QID) | ORAL | Status: DC | PRN

## 2019-01-15 MED ORDER — ACETAMINOPHEN 325 MG PO TABS: 650.0000 mg | ORAL_TABLET | Freq: Four times a day (QID) | ORAL | Status: DC | PRN

## 2019-01-15 MED ORDER — ACETAMINOPHEN 500 MG PO TABS
1000.0000 mg | ORAL_TABLET | Freq: Four times a day (QID) | ORAL | Status: DC
  Administered 2019-01-16 – 2019-01-21 (×16): 1000 mg via ORAL
  Filled 2019-01-15 (×16): qty 2

## 2019-01-15 MED ORDER — OXYCODONE HCL 5 MG PO TABS: 5.0000 mg | ORAL_TABLET | ORAL | Status: DC | PRN

## 2019-01-15 MED ORDER — HYDROMORPHONE HCL 1 MG/ML IJ SOLN
1.0000 mg | INTRAMUSCULAR | Status: DC | PRN
  Administered 2019-01-16 – 2019-01-20 (×20): 1 mg via INTRAVENOUS
  Filled 2019-01-15 (×21): qty 1

## 2019-01-15 MED ORDER — HYDROCORTISONE NA SUCCINATE PF 100 MG IJ SOLR
200.0000 mg | Freq: Once | INTRAMUSCULAR | Status: AC
  Administered 2019-01-15: 200 mg via INTRAVENOUS
  Filled 2019-01-15: qty 4
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
## 2019-01-15 NOTE — ED Notes (Signed)
Patient signed consent form for manual reduction on pt.'s right knee.

## 2019-01-15 NOTE — ED Notes (Signed)
Dr. Stann Mainland ( orthopedic surgeon ) explained plan of care to pt.
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
## 2019-01-15 NOTE — ED Notes (Signed)
Patient currently at CT scan .  

## 2019-01-16 ENCOUNTER — Inpatient Hospital Stay (HOSPITAL_COMMUNITY): Payer: Self-pay

## 2019-01-16 ENCOUNTER — Encounter (HOSPITAL_COMMUNITY)
Admission: EM | Disposition: A | Discharge status: Home Health | Payer: Self-pay | Source: Home / Self Care | Attending: Student

## 2019-01-16 ENCOUNTER — Other Ambulatory Visit (HOSPITAL_COMMUNITY): Payer: Self-pay

## 2019-01-16 ENCOUNTER — Inpatient Hospital Stay (HOSPITAL_COMMUNITY): Payer: Self-pay | Admitting: Certified Registered Nurse Anesthetist

## 2019-01-16 ENCOUNTER — Other Ambulatory Visit: Payer: Self-pay

## 2019-01-16 ENCOUNTER — Encounter (HOSPITAL_COMMUNITY): Payer: Self-pay | Admitting: Certified Registered Nurse Anesthetist

## 2019-01-16 DIAGNOSIS — S82151A Displaced fracture of right tibial tuberosity, initial encounter for closed fracture: Secondary | ICD-10-CM

## 2019-01-16 HISTORY — PX: EXTERNAL FIXATION LEG: SHX1549

## 2019-01-16 LAB — HIV ANTIBODY (ROUTINE TESTING W REFLEX): HIV Screen 4th Generation wRfx: NONREACTIVE

## 2019-01-16 LAB — MRSA PCR SCREENING: MRSA by PCR: NEGATIVE

## 2019-01-16 SURGERY — EXTERNAL FIXATION, LOWER EXTREMITY
Anesthesia: General | Site: Knee | Laterality: Right

## 2019-01-16 MED ORDER — LACTATED RINGERS IV SOLN
INTRAVENOUS | Status: DC | PRN
  Administered 2019-01-16: 09:00:00 via INTRAVENOUS

## 2019-01-16 MED ORDER — PROPOFOL 10 MG/ML IV BOLUS
INTRAVENOUS | Status: DC | PRN
  Administered 2019-01-16: 200 mg via INTRAVENOUS

## 2019-01-16 MED ORDER — ENOXAPARIN SODIUM 40 MG/0.4ML ~~LOC~~ SOLN
40.0000 mg | SUBCUTANEOUS | Status: DC
  Administered 2019-01-17 – 2019-01-19 (×3): 40 mg via SUBCUTANEOUS
  Filled 2019-01-16 (×3): qty 0.4

## 2019-01-16 MED ORDER — MEPERIDINE HCL 50 MG/ML IJ SOLN: 6.2500 mg | INTRAMUSCULAR | Status: DC | PRN

## 2019-01-16 MED ORDER — 0.9 % SODIUM CHLORIDE (POUR BTL) OPTIME
TOPICAL | Status: DC | PRN
  Administered 2019-01-16: 1000 mL

## 2019-01-16 MED ORDER — OXYCODONE HCL 5 MG PO TABS
5.0000 mg | ORAL_TABLET | ORAL | Status: DC | PRN
  Administered 2019-01-16 – 2019-01-17 (×5): 10 mg via ORAL
  Filled 2019-01-16 (×6): qty 2

## 2019-01-16 MED ORDER — CHLORHEXIDINE GLUCONATE 4 % EX LIQD
60.0000 mL | Freq: Once | CUTANEOUS | Status: AC
  Administered 2019-01-16: 4 via TOPICAL
  Filled 2019-01-16: qty 15

## 2019-01-16 MED ORDER — FENTANYL CITRATE (PF) 100 MCG/2ML IJ SOLN
INTRAMUSCULAR | Status: AC
  Filled 2019-01-16: qty 2

## 2019-01-16 MED ORDER — MIDAZOLAM HCL 5 MG/5ML IJ SOLN
INTRAMUSCULAR | Status: DC | PRN
  Administered 2019-01-16: 2 mg via INTRAVENOUS

## 2019-01-16 MED ORDER — CEFAZOLIN SODIUM-DEXTROSE 2-4 GM/100ML-% IV SOLN
2.0000 g | Freq: Three times a day (TID) | INTRAVENOUS | Status: AC
  Administered 2019-01-16 – 2019-01-17 (×3): 2 g via INTRAVENOUS
  Filled 2019-01-16 (×3): qty 100

## 2019-01-16 MED ORDER — LACTATED RINGERS IV SOLN: INTRAVENOUS | Status: DC

## 2019-01-16 MED ORDER — POVIDONE-IODINE 10 % EX SWAB: 2.0000 "application " | Freq: Once | CUTANEOUS | Status: DC

## 2019-01-16 MED ORDER — ONDANSETRON HCL 4 MG/2ML IJ SOLN
INTRAMUSCULAR | Status: DC | PRN
  Administered 2019-01-16: 4 mg via INTRAVENOUS

## 2019-01-16 MED ORDER — ALPRAZOLAM 0.5 MG PO TABS
2.0000 mg | ORAL_TABLET | Freq: Two times a day (BID) | ORAL | Status: DC | PRN
  Administered 2019-01-16 – 2019-01-26 (×23): 2 mg via ORAL
  Filled 2019-01-16 (×3): qty 4
  Filled 2019-01-16 (×2): qty 8
  Filled 2019-01-16: qty 4
  Filled 2019-01-16: qty 8
  Filled 2019-01-16: qty 4
  Filled 2019-01-16: qty 8
  Filled 2019-01-16 (×4): qty 4
  Filled 2019-01-16: qty 8
  Filled 2019-01-16 (×3): qty 4
  Filled 2019-01-16: qty 8
  Filled 2019-01-16 (×2): qty 4
  Filled 2019-01-16: qty 8
  Filled 2019-01-16: qty 4
  Filled 2019-01-16: qty 8
  Filled 2019-01-16: qty 4

## 2019-01-16 MED ORDER — PROPOFOL 10 MG/ML IV BOLUS
INTRAVENOUS | Status: AC
  Filled 2019-01-16: qty 20

## 2019-01-16 MED ORDER — MIDAZOLAM HCL 2 MG/2ML IJ SOLN
INTRAMUSCULAR | Status: AC
  Filled 2019-01-16: qty 2

## 2019-01-16 MED ORDER — FENTANYL CITRATE (PF) 100 MCG/2ML IJ SOLN
25.0000 ug | INTRAMUSCULAR | Status: DC | PRN
  Administered 2019-01-16 (×2): 50 ug via INTRAVENOUS

## 2019-01-16 MED ORDER — LIDOCAINE HCL (CARDIAC) PF 100 MG/5ML IV SOSY
PREFILLED_SYRINGE | INTRAVENOUS | Status: DC | PRN
  Administered 2019-01-16: 100 mg via INTRAVENOUS

## 2019-01-16 MED ORDER — IBUPROFEN 200 MG PO TABS
600.0000 mg | ORAL_TABLET | Freq: Four times a day (QID) | ORAL | Status: DC | PRN
  Administered 2019-01-16: 600 mg via ORAL
  Filled 2019-01-16 (×2): qty 3

## 2019-01-16 MED ORDER — CEFAZOLIN SODIUM-DEXTROSE 2-4 GM/100ML-% IV SOLN
2.0000 g | INTRAVENOUS | Status: AC
  Administered 2019-01-16: 2 g via INTRAVENOUS
  Filled 2019-01-16: qty 100

## 2019-01-16 MED ORDER — FENTANYL CITRATE (PF) 100 MCG/2ML IJ SOLN
INTRAMUSCULAR | Status: DC | PRN
  Administered 2019-01-16: 50 ug via INTRAVENOUS
  Administered 2019-01-16: 75 ug via INTRAVENOUS

## 2019-01-16 MED ORDER — FENTANYL CITRATE (PF) 250 MCG/5ML IJ SOLN
INTRAMUSCULAR | Status: AC
  Filled 2019-01-16: qty 5

## 2019-01-16 MED ORDER — DEXAMETHASONE SODIUM PHOSPHATE 10 MG/ML IJ SOLN
INTRAMUSCULAR | Status: DC | PRN
  Administered 2019-01-16 (×2): 10 mg via INTRAVENOUS

## 2019-01-16 MED ORDER — ONDANSETRON HCL 4 MG/2ML IJ SOLN
INTRAMUSCULAR | Status: AC
  Filled 2019-01-16: qty 2

## 2019-01-16 MED ORDER — METOCLOPRAMIDE HCL 5 MG/ML IJ SOLN: 10.0000 mg | Freq: Once | INTRAMUSCULAR | Status: DC | PRN

## 2019-01-16 SURGICAL SUPPLY — 50 items
BANDAGE ACE 4X5 VEL STRL LF (GAUZE/BANDAGES/DRESSINGS) ×3 IMPLANT
BANDAGE ACE 6X5 VEL STRL LF (GAUZE/BANDAGES/DRESSINGS) ×3 IMPLANT
BIT DRILL CANN 2.7X625 NONSTRL (BIT) ×2 IMPLANT
BNDG COHESIVE 4X5 TAN STRL (GAUZE/BANDAGES/DRESSINGS) ×3 IMPLANT
BNDG GAUZE ELAST 4 BULKY (GAUZE/BANDAGES/DRESSINGS) ×4 IMPLANT
BRUSH SCRUB SURG 4.25 DISP (MISCELLANEOUS) ×6 IMPLANT
CHLORAPREP W/TINT 26ML (MISCELLANEOUS) ×3 IMPLANT
CLAMP LG MULTI PIN (Clamp) ×4 IMPLANT
CLAMP ROD ATTACHMENT (Clamp) ×4 IMPLANT
CLOSURE WOUND 1/2 X4 (GAUZE/BANDAGES/DRESSINGS)
COVER SURGICAL LIGHT HANDLE (MISCELLANEOUS) ×6 IMPLANT
COVER WAND RF STERILE (DRAPES) ×3 IMPLANT
DRAPE C-ARM 42X72 X-RAY (DRAPES) IMPLANT
DRAPE C-ARMOR (DRAPES) ×3 IMPLANT
DRAPE IMP U-DRAPE 54X76 (DRAPES) ×6 IMPLANT
DRAPE ORTHO SPLIT 77X108 STRL (DRAPES) ×6
DRAPE SURG ORHT 6 SPLT 77X108 (DRAPES) ×2 IMPLANT
DRAPE U-SHAPE 47X51 STRL (DRAPES) ×3 IMPLANT
DRSG MEPITEL 4X7.2 (GAUZE/BANDAGES/DRESSINGS) ×2 IMPLANT
ELECT REM PT RETURN 9FT ADLT (ELECTROSURGICAL) ×3
ELECTRODE REM PT RTRN 9FT ADLT (ELECTROSURGICAL) ×1 IMPLANT
GAUZE SPONGE 4X4 12PLY STRL (GAUZE/BANDAGES/DRESSINGS) ×3 IMPLANT
GLOVE BIO SURGEON STRL SZ 6.5 (GLOVE) ×6 IMPLANT
GLOVE BIO SURGEON STRL SZ7.5 (GLOVE) ×12 IMPLANT
GLOVE BIO SURGEONS STRL SZ 6.5 (GLOVE) ×3
GLOVE BIOGEL PI IND STRL 6.5 (GLOVE) ×1 IMPLANT
GLOVE BIOGEL PI IND STRL 7.5 (GLOVE) ×1 IMPLANT
GLOVE BIOGEL PI INDICATOR 6.5 (GLOVE) ×2
GLOVE BIOGEL PI INDICATOR 7.5 (GLOVE) ×2
GOWN STRL REUS W/ TWL LRG LVL3 (GOWN DISPOSABLE) ×2 IMPLANT
GOWN STRL REUS W/TWL LRG LVL3 (GOWN DISPOSABLE) ×6
KIT BASIN OR (CUSTOM PROCEDURE TRAY) ×3 IMPLANT
KIT TURNOVER KIT B (KITS) ×3 IMPLANT
MANIFOLD NEPTUNE II (INSTRUMENTS) ×3 IMPLANT
NEEDLE 22X1 1/2 (OR ONLY) (NEEDLE) IMPLANT
NS IRRIG 1000ML POUR BTL (IV SOLUTION) ×3 IMPLANT
PACK ORTHO EXTREMITY (CUSTOM PROCEDURE TRAY) ×3 IMPLANT
PAD ARMBOARD 7.5X6 YLW CONV (MISCELLANEOUS) ×6 IMPLANT
PADDING CAST COTTON 6X4 STRL (CAST SUPPLIES) ×4 IMPLANT
ROD CARBON FIBER 550MM (Rod) ×4 IMPLANT
SCREW SCHNZ SD 5.0 80 THRD/200 (Screw) ×2 IMPLANT
SCREW SHANZ 5.0X175MM (Screw) ×4 IMPLANT
SCRW SCHANZ SD 5.0 80 THRD/200 (Screw) ×6 IMPLANT
SPONGE LAP 18X18 X RAY DECT (DISPOSABLE) ×3 IMPLANT
STAPLER VISISTAT 35W (STAPLE) ×3 IMPLANT
STRIP CLOSURE SKIN 1/2X4 (GAUZE/BANDAGES/DRESSINGS) IMPLANT
SUT PROLENE 0 CT (SUTURE) IMPLANT
TOWEL OR 17X24 6PK STRL BLUE (TOWEL DISPOSABLE) ×6 IMPLANT
TOWEL OR 17X26 10 PK STRL BLUE (TOWEL DISPOSABLE) ×6 IMPLANT
UNDERPAD 30X30 (UNDERPADS AND DIAPERS) ×3 IMPLANT

## 2019-01-16 NOTE — Progress Notes (Addendum)
Pt's underwear cut off by pt, pt's ring and wallet put inside shoe in pt's belonging bag per pt request. Report called to short stay. Night shift RN reported that CT scan unable to be done last night. This RN informed SS of same.

## 2019-01-16 NOTE — Anesthesia Procedure Notes (Signed)
Procedure Name: LMA Insertion Date/Time: 01/16/2019 9:31 AM Performed by: Carney Living, CRNA Pre-anesthesia Checklist: Patient identified, Emergency Drugs available, Suction available, Patient being monitored and Timeout performed Patient Re-evaluated:Patient Re-evaluated prior to induction Oxygen Delivery Method: Circle system utilized Preoxygenation: Pre-oxygenation with 100% oxygen Induction Type: IV induction LMA: LMA inserted LMA Size: 5.0 Number of attempts: 1 Placement Confirmation: positive ETCO2 and breath sounds checked- equal and bilateral Tube secured with: Tape Dental Injury: Teeth and Oropharynx as per pre-operative assessment

## 2019-01-16 NOTE — Progress Notes (Signed)
Pt returned to floor, drowsy but easily arousalable. Pt waiting on transport to go to CT.
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
## 2019-01-16 NOTE — Anesthesia Postprocedure Evaluation (Signed)
Anesthesia Post Note  Patient: Evan [name]  Procedure(s) Performed: EXTERNAL FIXATION RIGHT KNEE (Right Knee)     Patient location during evaluation: PACU Anesthesia Type: General Level of consciousness: awake and alert Pain management: pain level controlled Vital Signs Assessment: post-procedure vital signs reviewed and stable Respiratory status: spontaneous breathing, nonlabored ventilation, respiratory function stable and patient connected to nasal cannula oxygen Cardiovascular status: blood pressure returned to baseline and stable Postop Assessment: no apparent nausea or vomiting Anesthetic complications: no    Last Vitals:  Vitals:   01/16/19 1115 01/16/19 1148  BP: (!) 175/89 (!) 150/84  Pulse: 88 91  Resp: 16 16  Temp: 36.7 C 37.2 C  SpO2: 97% 95%    Last Pain:  Vitals:   01/16/19 1148  TempSrc: Oral  PainSc: Asleep                 Montez Hageman
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
## 2019-01-16 NOTE — Anesthesia Preprocedure Evaluation (Addendum)
Anesthesia Evaluation  Patient identified by MRN, date of birth, ID band Patient awake    Reviewed: Allergy & Precautions, NPO status , Patient's Chart, lab work & pertinent test results  Airway Mallampati: II  TM Distance: >3 FB Neck ROM: Full    Dental no notable dental hx. (+) Missing, Poor Dentition, Dental Advisory Given   Pulmonary neg pulmonary ROS, former smoker,    Pulmonary exam normal breath sounds clear to auscultation       Cardiovascular hypertension, negative cardio ROS Normal cardiovascular exam Rhythm:Regular Rate:Normal     Neuro/Psych negative neurological ROS  negative psych ROS   GI/Hepatic negative GI ROS, (+) Hepatitis -, C  Endo/Other  negative endocrine ROS  Renal/GU negative Renal ROS  negative genitourinary   Musculoskeletal negative musculoskeletal ROS (+)   Abdominal   Peds negative pediatric ROS (+)  Hematology negative hematology ROS (+)   Anesthesia Other Findings   Reproductive/Obstetrics negative OB ROS                            Anesthesia Physical Anesthesia Plan  ASA: II  Anesthesia Plan: General   Post-op Pain Management:    Induction: Intravenous  PONV Risk Score and Plan: 2 and Ondansetron, Dexamethasone and Treatment may vary due to age or medical condition  Airway Management Planned: LMA  Additional Equipment:   Intra-op Plan:   Post-operative Plan: Extubation in OR  Informed Consent: I have reviewed the patients History and Physical, chart, labs and discussed the procedure including the risks, benefits and alternatives for the proposed anesthesia with the patient or authorized representative who has indicated his/her understanding and acceptance.     Dental advisory given  Plan Discussed with: CRNA, Anesthesiologist and Surgeon  Anesthesia Plan Comments:        Anesthesia Quick Evaluation
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
## 2019-01-16 NOTE — Transfer of Care (Signed)
Immediate Anesthesia Transfer of Care Note  Patient: Evan [name]  Procedure(s) Performed: EXTERNAL FIXATION RIGHT KNEE (Right Knee)  Patient Location: PACU  Anesthesia Type:General  Level of Consciousness: awake, alert , oriented and patient cooperative  Airway & Oxygen Therapy: Patient Spontanous Breathing and Patient connected to nasal cannula oxygen  Post-op Assessment: Report given to RN, Post -op Vital signs reviewed and stable and Patient moving all extremities X 4  Post vital signs: Reviewed and stable  Last Vitals:  Vitals Value Taken Time  BP 176/99 01/16/2019 10:28 AM  Temp    Pulse 87 01/16/2019 10:29 AM  Resp 22 01/16/2019 10:29 AM  SpO2 100 % 01/16/2019 10:29 AM  Vitals shown include unvalidated device data.  Last Pain:  Vitals:   01/16/19 0710  TempSrc:   PainSc: Asleep      Patients Stated Pain Goal: 0 (82/42/35 3614)  Complications: No apparent anesthesia complications
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
## 2019-01-17 LAB — CBC
HCT: 33.3 % — ABNORMAL LOW (ref 39.0–52.0)
Hemoglobin: 10.1 g/dL — ABNORMAL LOW (ref 13.0–17.0)
MCH: 26.7 pg (ref 26.0–34.0)
MCHC: 30.3 g/dL (ref 30.0–36.0)
MCV: 88.1 fL (ref 80.0–100.0)
NRBC: 0 % (ref 0.0–0.2)
Platelets: 276 10*3/uL (ref 150–400)
RBC: 3.78 MIL/uL — ABNORMAL LOW (ref 4.22–5.81)
RDW: 15 % (ref 11.5–15.5)
WBC: 8.9 10*3/uL (ref 4.0–10.5)
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
## 2019-01-17 NOTE — Progress Notes (Signed)
PT Cancellation Note  Patient Details Name: Evan [name] MRN: [id number] DOB: May 17, 1971   Cancelled Treatment:    Reason Eval/Treat Not Completed: Pain limiting ability to participate  Checked on patient a few hours ago and he requested PT return later in the day. On follow-up he states he is still in too much pain to participate at this time. Willing to practice transfers to [facility] at next visit.   Elayne Snare, PT, DPT  Ellouise Newer 01/17/2019, 3:54 PM

## 2019-01-17 NOTE — Progress Notes (Signed)
Pt has a blister on his LLE MD notifed gave orders to pop the blister and place 4x4 and ace wrap

## 2019-01-17 NOTE — Progress Notes (Signed)
MD wants pt to have foot pumps, RN notified secretary to get the foot pumps ordered. Will follow up

## 2019-01-18 MED ORDER — OXYCODONE HCL 5 MG PO TABS
10.0000 mg | ORAL_TABLET | ORAL | Status: DC | PRN
  Administered 2019-01-18 – 2019-01-19 (×3): 15 mg via ORAL
  Filled 2019-01-18 (×4): qty 3

## 2019-01-18 MED ORDER — KETOROLAC TROMETHAMINE 15 MG/ML IJ SOLN
15.0000 mg | Freq: Four times a day (QID) | INTRAMUSCULAR | Status: AC
  Administered 2019-01-18 (×3): 15 mg via INTRAVENOUS
  Filled 2019-01-18 (×4): qty 1

## 2019-01-18 MED ORDER — ZOLPIDEM TARTRATE 5 MG PO TABS
5.0000 mg | ORAL_TABLET | Freq: Every evening | ORAL | Status: DC | PRN
  Administered 2019-01-18 – 2019-01-25 (×7): 5 mg via ORAL
  Filled 2019-01-18 (×8): qty 1
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
## 2019-01-19 ENCOUNTER — Encounter (HOSPITAL_COMMUNITY): Payer: Self-pay | Admitting: Student

## 2019-01-19 LAB — BASIC METABOLIC PANEL
Anion gap: 13 (ref 5–15)
Anion gap: 7 (ref 5–15)
BUN: 26 mg/dL — AB (ref 6–20)
BUN: 21 mg/dL — ABNORMAL HIGH (ref 6–20)
CALCIUM: 8.4 mg/dL — AB (ref 8.9–10.3)
CO2: 23 mmol/L (ref 22–32)
CO2: 26 mmol/L (ref 22–32)
Calcium: 8.5 mg/dL — ABNORMAL LOW (ref 8.9–10.3)
Chloride: 97 mmol/L — ABNORMAL LOW (ref 98–111)
Chloride: 101 mmol/L (ref 98–111)
Creatinine, Ser: 1.08 mg/dL (ref 0.61–1.24)
Creatinine, Ser: 1.11 mg/dL (ref 0.61–1.24)
GFR calc Af Amer: >60 mL/min (ref >60)
GFR calc non Af Amer: >60 mL/min (ref >60)
GFR calc non Af Amer: >60 mL/min (ref >60)
Glucose, Bld: 74 mg/dL (ref 70–99)
Glucose, Bld: 90 mg/dL (ref 70–99)
Potassium: 6.2 mmol/L — ABNORMAL HIGH (ref 3.5–5.1)
Potassium: 4.6 mmol/L (ref 3.5–5.1)
Sodium: 133 mmol/L — ABNORMAL LOW (ref 135–145)
Sodium: 134 mmol/L — ABNORMAL LOW (ref 135–145)

## 2019-01-19 LAB — CBC
HCT: 31.4 % — ABNORMAL LOW (ref 39.0–52.0)
Hemoglobin: 9.7 g/dL — ABNORMAL LOW (ref 13.0–17.0)
MCH: 27.2 pg (ref 26.0–34.0)
MCHC: 30.9 g/dL (ref 30.0–36.0)
MCV: 88.2 fL (ref 80.0–100.0)
Platelets: 284 10*3/uL (ref 150–400)
RBC: 3.56 MIL/uL — ABNORMAL LOW (ref 4.22–5.81)
RDW: 14.4 % (ref 11.5–15.5)
WBC: 10.4 10*3/uL (ref 4.0–10.5)
nRBC: 0 % (ref 0.0–0.2)

## 2019-01-19 MED ORDER — HYDROMORPHONE HCL 2 MG PO TABS
4.0000 mg | ORAL_TABLET | ORAL | Status: DC | PRN
  Administered 2019-01-19 – 2019-01-20 (×6): 4 mg via ORAL
  Filled 2019-01-19 (×5): qty 2
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
## 2019-01-19 NOTE — Progress Notes (Signed)
PT Cancellation Note  Patient Details Name: Evan [name] MRN: [id number] DOB: [birth year]   Cancelled Treatment:    Reason Eval/Treat Not Completed: (P) Pain limiting ability to participate Pt reports wanting to get up to [facility] but currently in too much pain. PT informed pt that they may not be able to follow back again today. Pt refused therapy at this time. PT will follow back as able.  Escher Harr B. Migdalia Dk PT, DPT Acute Rehabilitation Services Pager [phone] Office [phone]    [facility] 01/19/2019, 12:35 PM

## 2019-01-19 NOTE — Plan of Care (Signed)

## 2019-01-19 NOTE — Anesthesia Preprocedure Evaluation (Addendum)
Anesthesia Evaluation  Patient identified by MRN, date of birth, ID band Patient awake    Reviewed: Allergy & Precautions, NPO status , Patient's Chart, lab work & pertinent test results  Airway Mallampati: I  TM Distance: >3 FB Neck ROM: Full    Dental no notable dental hx. (+) Teeth Intact, Dental Advisory Given   Pulmonary neg pulmonary ROS, former smoker,    Pulmonary exam normal breath sounds clear to auscultation       Cardiovascular hypertension, negative cardio ROS Normal cardiovascular exam Rhythm:Regular Rate:Normal     Neuro/Psych PSYCHIATRIC DISORDERS Anxiety Depression negative neurological ROS     GI/Hepatic negative GI ROS, (+) Hepatitis -, C  Endo/Other  negative endocrine ROS  Renal/GU negative Renal ROS  negative genitourinary   Musculoskeletal negative musculoskeletal ROS (+)   Abdominal   Peds  (+) ADHD Hematology  (+) Blood dyscrasia, anemia ,   Anesthesia Other Findings Right tibial plateau fracture  Reproductive/Obstetrics                            Anesthesia Physical Anesthesia Plan  ASA: II  Anesthesia Plan: General   Post-op Pain Management:    Induction: Intravenous  PONV Risk Score and Plan: 2 and Midazolam, Dexamethasone and Ondansetron  Airway Management Planned: LMA and Oral ETT  Additional Equipment:   Intra-op Plan:   Post-operative Plan: Extubation in OR  Informed Consent: I have reviewed the patients History and Physical, chart, labs and discussed the procedure including the risks, benefits and alternatives for the proposed anesthesia with the patient or authorized representative who has indicated his/her understanding and acceptance.     Dental advisory given  Plan Discussed with: CRNA  Anesthesia Plan Comments:         Anesthesia Quick Evaluation

## 2019-01-20 ENCOUNTER — Encounter (HOSPITAL_COMMUNITY)
Admission: EM | Disposition: A | Discharge status: Home Health | Payer: Self-pay | Source: Home / Self Care | Attending: Student

## 2019-01-20 ENCOUNTER — Inpatient Hospital Stay (HOSPITAL_COMMUNITY): Payer: Self-pay | Admitting: Anesthesiology

## 2019-01-20 ENCOUNTER — Inpatient Hospital Stay (HOSPITAL_COMMUNITY): Payer: Self-pay

## 2019-01-20 HISTORY — PX: ORIF TIBIA PLATEAU: SHX2132

## 2019-01-20 SURGERY — OPEN REDUCTION INTERNAL FIXATION (ORIF) TIBIAL PLATEAU
Anesthesia: General | Laterality: Right

## 2019-01-20 MED ORDER — FENTANYL CITRATE (PF) 100 MCG/2ML IJ SOLN
INTRAMUSCULAR | Status: AC
  Filled 2019-01-20: qty 2

## 2019-01-20 MED ORDER — HYDROMORPHONE HCL 1 MG/ML IJ SOLN
INTRAMUSCULAR | Status: AC
  Filled 2019-01-20: qty 0.5

## 2019-01-20 MED ORDER — VANCOMYCIN HCL 1000 MG IV SOLR
INTRAVENOUS | Status: AC
  Filled 2019-01-20: qty 1000

## 2019-01-20 MED ORDER — MIDAZOLAM HCL 2 MG/2ML IJ SOLN
INTRAMUSCULAR | Status: DC | PRN
  Administered 2019-01-20: 2 mg via INTRAVENOUS

## 2019-01-20 MED ORDER — GLYCOPYRROLATE PF 0.2 MG/ML IJ SOSY
PREFILLED_SYRINGE | INTRAMUSCULAR | Status: AC
  Filled 2019-01-20: qty 1

## 2019-01-20 MED ORDER — SUGAMMADEX SODIUM 200 MG/2ML IV SOLN
INTRAVENOUS | Status: DC | PRN
  Administered 2019-01-20: 199.6 mg via INTRAVENOUS

## 2019-01-20 MED ORDER — MIDAZOLAM HCL 2 MG/2ML IJ SOLN
INTRAMUSCULAR | Status: AC
  Filled 2019-01-20: qty 2

## 2019-01-20 MED ORDER — HYDRALAZINE HCL 20 MG/ML IJ SOLN
INTRAMUSCULAR | Status: AC
  Filled 2019-01-20: qty 1

## 2019-01-20 MED ORDER — DEXMEDETOMIDINE HCL 200 MCG/2ML IV SOLN
INTRAVENOUS | Status: DC | PRN
  Administered 2019-01-20: 8 ug via INTRAVENOUS
  Administered 2019-01-20: 4 ug via INTRAVENOUS
  Administered 2019-01-20: 8 ug via INTRAVENOUS

## 2019-01-20 MED ORDER — ROCURONIUM BROMIDE 10 MG/ML (PF) SYRINGE
PREFILLED_SYRINGE | INTRAVENOUS | Status: DC | PRN
  Administered 2019-01-20: 50 mg via INTRAVENOUS
  Administered 2019-01-20: 20 mg via INTRAVENOUS

## 2019-01-20 MED ORDER — KETAMINE HCL 50 MG/ML IJ SOLN
INTRAMUSCULAR | Status: DC | PRN
  Administered 2019-01-20 (×5): 10 mg via INTRAMUSCULAR

## 2019-01-20 MED ORDER — ACETAMINOPHEN 10 MG/ML IV SOLN
INTRAVENOUS | Status: AC
  Filled 2019-01-20: qty 100

## 2019-01-20 MED ORDER — VANCOMYCIN HCL 1000 MG IV SOLR
INTRAVENOUS | Status: DC | PRN
  Administered 2019-01-20: 1000 mg

## 2019-01-20 MED ORDER — FENTANYL CITRATE (PF) 250 MCG/5ML IJ SOLN
INTRAMUSCULAR | Status: AC
  Filled 2019-01-20: qty 5

## 2019-01-20 MED ORDER — FENTANYL 40 MCG/ML IV SOLN
INTRAVENOUS | Status: DC
  Administered 2019-01-19: 21:00:00 via INTRAVENOUS
  Administered 2019-01-20: 1000 ug via INTRAVENOUS
  Administered 2019-01-21: 05:00:00 via INTRAVENOUS
  Administered 2019-01-21: 180 ug via INTRAVENOUS
  Administered 2019-01-21: 01:00:00 via INTRAVENOUS
  Filled 2019-01-20 (×2): qty 25

## 2019-01-20 MED ORDER — CEFAZOLIN SODIUM-DEXTROSE 2-4 GM/100ML-% IV SOLN
2.0000 g | Freq: Three times a day (TID) | INTRAVENOUS | Status: AC
  Administered 2019-01-20 – 2019-01-21 (×3): 2 g via INTRAVENOUS
  Filled 2019-01-20 (×3): qty 100

## 2019-01-20 MED ORDER — FENTANYL CITRATE (PF) 250 MCG/5ML IJ SOLN
INTRAMUSCULAR | Status: DC | PRN
  Administered 2019-01-20 (×5): 50 ug via INTRAVENOUS

## 2019-01-20 MED ORDER — ROCURONIUM BROMIDE 50 MG/5ML IV SOSY
PREFILLED_SYRINGE | INTRAVENOUS | Status: AC
  Filled 2019-01-20: qty 10

## 2019-01-20 MED ORDER — LIDOCAINE 2% (20 MG/ML) 5 ML SYRINGE
INTRAMUSCULAR | Status: AC
  Filled 2019-01-20: qty 5

## 2019-01-20 MED ORDER — SODIUM CHLORIDE 0.9% FLUSH: 9.0000 mL | INTRAVENOUS | Status: DC | PRN

## 2019-01-20 MED ORDER — BACITRACIN ZINC 500 UNIT/GM EX OINT
TOPICAL_OINTMENT | CUTANEOUS | Status: AC
  Filled 2019-01-20: qty 28.35

## 2019-01-20 MED ORDER — ENOXAPARIN SODIUM 40 MG/0.4ML ~~LOC~~ SOLN
40.0000 mg | SUBCUTANEOUS | Status: DC
  Administered 2019-01-21 – 2019-01-26 (×6): 40 mg via SUBCUTANEOUS
  Filled 2019-01-20 (×6): qty 0.4

## 2019-01-20 MED ORDER — 0.9 % SODIUM CHLORIDE (POUR BTL) OPTIME
TOPICAL | Status: DC | PRN
  Administered 2019-01-20: 1000 mL

## 2019-01-20 MED ORDER — TOBRAMYCIN SULFATE 1.2 G IJ SOLR
INTRAMUSCULAR | Status: DC | PRN
  Administered 2019-01-20: 1.2 g

## 2019-01-20 MED ORDER — METHOCARBAMOL 500 MG PO TABS
ORAL_TABLET | ORAL | Status: AC
  Filled 2019-01-20: qty 1

## 2019-01-20 MED ORDER — DIPHENHYDRAMINE HCL 50 MG/ML IJ SOLN: 12.5000 mg | Freq: Four times a day (QID) | INTRAMUSCULAR | Status: DC | PRN

## 2019-01-20 MED ORDER — KETAMINE HCL 50 MG/5ML IJ SOSY
PREFILLED_SYRINGE | INTRAMUSCULAR | Status: AC
  Filled 2019-01-20: qty 10

## 2019-01-20 MED ORDER — LACTATED RINGERS IV SOLN
INTRAVENOUS | Status: DC | PRN
  Administered 2019-01-20 (×3): via INTRAVENOUS

## 2019-01-20 MED ORDER — CEFAZOLIN SODIUM-DEXTROSE 2-3 GM-%(50ML) IV SOLR
INTRAVENOUS | Status: DC | PRN
  Administered 2019-01-20: 2 g via INTRAVENOUS

## 2019-01-20 MED ORDER — DEXAMETHASONE SODIUM PHOSPHATE 10 MG/ML IJ SOLN
INTRAMUSCULAR | Status: DC | PRN
  Administered 2019-01-20: 10 mg via INTRAVENOUS

## 2019-01-20 MED ORDER — NALOXONE HCL 0.4 MG/ML IJ SOLN: 0.4000 mg | INTRAMUSCULAR | Status: DC | PRN

## 2019-01-20 MED ORDER — HYDROMORPHONE HCL 1 MG/ML IJ SOLN
INTRAMUSCULAR | Status: DC | PRN
  Administered 2019-01-20 (×2): 0.5 mg via INTRAVENOUS

## 2019-01-20 MED ORDER — FENTANYL CITRATE (PF) 100 MCG/2ML IJ SOLN
25.0000 ug | INTRAMUSCULAR | Status: DC | PRN
  Administered 2019-01-20 (×3): 50 ug via INTRAVENOUS

## 2019-01-20 MED ORDER — DIPHENHYDRAMINE HCL 12.5 MG/5ML PO ELIX
12.5000 mg | ORAL_SOLUTION | Freq: Four times a day (QID) | ORAL | Status: DC | PRN
  Filled 2019-01-20: qty 10

## 2019-01-20 MED ORDER — ONDANSETRON HCL 4 MG/2ML IJ SOLN: 4.0000 mg | Freq: Four times a day (QID) | INTRAMUSCULAR | Status: DC | PRN

## 2019-01-20 MED ORDER — LIDOCAINE 2% (20 MG/ML) 5 ML SYRINGE
INTRAMUSCULAR | Status: DC | PRN
  Administered 2019-01-20: 60 mg via INTRAVENOUS

## 2019-01-20 MED ORDER — PHENYLEPHRINE 40 MCG/ML (10ML) SYRINGE FOR IV PUSH (FOR BLOOD PRESSURE SUPPORT)
PREFILLED_SYRINGE | INTRAVENOUS | Status: AC
  Filled 2019-01-20: qty 20

## 2019-01-20 MED ORDER — ACETAMINOPHEN 10 MG/ML IV SOLN
INTRAVENOUS | Status: DC | PRN
  Administered 2019-01-20: 1000 mg via INTRAVENOUS

## 2019-01-20 MED ORDER — ONDANSETRON HCL 4 MG/2ML IJ SOLN
INTRAMUSCULAR | Status: DC | PRN
  Administered 2019-01-20: 4 mg via INTRAVENOUS

## 2019-01-20 MED ORDER — HYDROMORPHONE HCL 2 MG PO TABS
ORAL_TABLET | ORAL | Status: AC
  Filled 2019-01-20: qty 2

## 2019-01-20 MED ORDER — HYDRALAZINE HCL 20 MG/ML IJ SOLN
5.0000 mg | Freq: Once | INTRAMUSCULAR | Status: AC
  Administered 2019-01-20: 5 mg via INTRAVENOUS

## 2019-01-20 MED ORDER — GLYCOPYRROLATE 0.2 MG/ML IJ SOLN
INTRAMUSCULAR | Status: DC | PRN
  Administered 2019-01-20 (×2): 0.1 mg via INTRAVENOUS

## 2019-01-20 MED ORDER — DEXAMETHASONE SODIUM PHOSPHATE 10 MG/ML IJ SOLN
INTRAMUSCULAR | Status: AC
  Filled 2019-01-20: qty 1

## 2019-01-20 MED ORDER — ALBUMIN HUMAN 5 % IV SOLN
INTRAVENOUS | Status: DC | PRN
  Administered 2019-01-20: 11:00:00 via INTRAVENOUS

## 2019-01-20 MED ORDER — PHENYLEPHRINE HCL 10 MG/ML IJ SOLN
INTRAMUSCULAR | Status: DC | PRN
  Administered 2019-01-20: 80 ug via INTRAVENOUS
  Administered 2019-01-20: 120 ug via INTRAVENOUS

## 2019-01-20 MED ORDER — TOBRAMYCIN SULFATE 1.2 G IJ SOLR
INTRAMUSCULAR | Status: AC
  Filled 2019-01-20: qty 1.2

## 2019-01-20 MED ORDER — PROPOFOL 10 MG/ML IV BOLUS
INTRAVENOUS | Status: AC
  Filled 2019-01-20: qty 40

## 2019-01-20 MED ORDER — ONDANSETRON HCL 4 MG/2ML IJ SOLN
INTRAMUSCULAR | Status: AC
  Filled 2019-01-20: qty 2

## 2019-01-20 MED ORDER — PROPOFOL 10 MG/ML IV BOLUS
INTRAVENOUS | Status: DC | PRN
  Administered 2019-01-20: 50 mg via INTRAVENOUS
  Administered 2019-01-20: 150 mg via INTRAVENOUS

## 2019-01-20 SURGICAL SUPPLY — 89 items
BANDAGE ACE 4X5 VEL STRL LF (GAUZE/BANDAGES/DRESSINGS) ×3 IMPLANT
BANDAGE ACE 6X5 VEL STRL LF (GAUZE/BANDAGES/DRESSINGS) ×3 IMPLANT
BANDAGE ESMARK 6X9 LF (GAUZE/BANDAGES/DRESSINGS) ×1 IMPLANT
BIT DRILL 2.5 X LONG (BIT) ×1
BIT DRILL CALI LONG 2.8MM (BIT) IMPLANT
BIT DRILL PERC QC 2.8X200 100 (BIT) IMPLANT
BIT DRILL QC 3.5X110 (BIT) ×2 IMPLANT
BIT DRILL X LONG 2.5 (BIT) IMPLANT
BLADE SURG 15 STRL LF DISP TIS (BLADE) ×1 IMPLANT
BLADE SURG 15 STRL SS (BLADE) ×3
BNDG CMPR 9X6 STRL LF SNTH (GAUZE/BANDAGES/DRESSINGS) ×1
BNDG CMPR MED 15X6 ELC VLCR LF (GAUZE/BANDAGES/DRESSINGS) ×1
BNDG ELASTIC 6X15 VLCR STRL LF (GAUZE/BANDAGES/DRESSINGS) ×2 IMPLANT
BNDG ESMARK 6X9 LF (GAUZE/BANDAGES/DRESSINGS) ×3
BNDG GAUZE ELAST 4 BULKY (GAUZE/BANDAGES/DRESSINGS) ×3 IMPLANT
BRUSH SCRUB SURG 4.25 DISP (MISCELLANEOUS) ×6 IMPLANT
CANISTER SUCT 3000ML PPV (MISCELLANEOUS) ×3 IMPLANT
CHLORAPREP W/TINT 26ML (MISCELLANEOUS) ×6 IMPLANT
COVER SURGICAL LIGHT HANDLE (MISCELLANEOUS) ×3 IMPLANT
COVER WAND RF STERILE (DRAPES) ×3 IMPLANT
CUFF TOURNIQUET SINGLE 34IN LL (TOURNIQUET CUFF) ×3 IMPLANT
DRAPE C-ARM 42X72 X-RAY (DRAPES) ×3 IMPLANT
DRAPE C-ARMOR (DRAPES) ×3 IMPLANT
DRAPE ORTHO SPLIT 77X108 STRL (DRAPES) ×6
DRAPE SURG ORHT 6 SPLT 77X108 (DRAPES) ×2 IMPLANT
DRAPE U-SHAPE 47X51 STRL (DRAPES) ×3 IMPLANT
DRILL BIT CALI LONG 2.8MM (BIT) ×3
DRILL BIT QUICK COUP 2.8MM 100 (BIT) ×2
DRILL BIT X LONG 2.5 (BIT) ×3
DRSG ADAPTIC 3X8 NADH LF (GAUZE/BANDAGES/DRESSINGS) ×6 IMPLANT
DRSG VAC ATS LRG SENSATRAC (GAUZE/BANDAGES/DRESSINGS) ×2 IMPLANT
ELECT REM PT RETURN 9FT ADLT (ELECTROSURGICAL) ×3
ELECTRODE REM PT RTRN 9FT ADLT (ELECTROSURGICAL) ×1 IMPLANT
GAUZE SPONGE 4X4 12PLY STRL (GAUZE/BANDAGES/DRESSINGS) ×3 IMPLANT
GLOVE BIO SURGEON STRL SZ 6.5 (GLOVE) ×6 IMPLANT
GLOVE BIO SURGEON STRL SZ7.5 (GLOVE) ×12 IMPLANT
GLOVE BIO SURGEONS STRL SZ 6.5 (GLOVE) ×3
GLOVE BIOGEL PI IND STRL 6.5 (GLOVE) ×1 IMPLANT
GLOVE BIOGEL PI IND STRL 7.5 (GLOVE) ×1 IMPLANT
GLOVE BIOGEL PI INDICATOR 6.5 (GLOVE) ×2
GLOVE BIOGEL PI INDICATOR 7.5 (GLOVE) ×2
GOWN STRL REUS W/ TWL LRG LVL3 (GOWN DISPOSABLE) ×2 IMPLANT
GOWN STRL REUS W/TWL LRG LVL3 (GOWN DISPOSABLE) ×6
IMMOBILIZER KNEE 22 UNIV (SOFTGOODS) ×2 IMPLANT
KIT BASIN OR (CUSTOM PROCEDURE TRAY) ×3 IMPLANT
KIT TURNOVER KIT B (KITS) ×3 IMPLANT
NDL SUT 6 .5 CRC .975X.05 MAYO (NEEDLE) ×1 IMPLANT
NEEDLE MAYO TAPER (NEEDLE) ×3
NS IRRIG 1000ML POUR BTL (IV SOLUTION) ×3 IMPLANT
PACK TOTAL JOINT (CUSTOM PROCEDURE TRAY) ×3 IMPLANT
PAD ARMBOARD 7.5X6 YLW CONV (MISCELLANEOUS) ×6 IMPLANT
PAD CAST 4YDX4 CTTN HI CHSV (CAST SUPPLIES) ×1 IMPLANT
PADDING CAST COTTON 4X4 STRL (CAST SUPPLIES) ×3
PADDING CAST COTTON 6X4 STRL (CAST SUPPLIES) ×3 IMPLANT
PLATE 10H PROX TIBIA 3.5X177 (Plate) ×2 IMPLANT
PLATE 8H RT 3.5MM MED PROX TIB (Plate) ×2 IMPLANT
SCREW CORT HEADED ST 3.5X32 (Screw) ×4 IMPLANT
SCREW HEADED ST 3.5X36 (Screw) ×8 IMPLANT
SCREW HEADED ST 3.5X38 (Screw) ×4 IMPLANT
SCREW HEADED ST 3.5X40 (Screw) ×4 IMPLANT
SCREW HEADED ST 3.5X46 (Screw) ×2 IMPLANT
SCREW HEADED ST 3.5X58 (Screw) ×2 IMPLANT
SCREW HEADED ST 3.5X80 (Screw) ×2 IMPLANT
SCREW HEADED ST 3.5X90 (Screw) ×2 IMPLANT
SCREW LOCKING 3.5X90MM VA (Screw) ×2 IMPLANT
SCREW LOCKING VA 3.5X50MM (Screw) ×2 IMPLANT
SCREW LOCKING VA 3.5X75MM (Screw) ×2 IMPLANT
SCREW LOCKING VA 3.5X85MM (Screw) ×4 IMPLANT
SCREW VA-LOCKING 65MM 3.5 (Screw) ×2 IMPLANT
STAPLER VISISTAT 35W (STAPLE) ×3 IMPLANT
SUCTION FRAZIER HANDLE 10FR (MISCELLANEOUS) ×2
SUCTION TUBE FRAZIER 10FR DISP (MISCELLANEOUS) ×1 IMPLANT
SUT ETHILON 2 0 FS 18 (SUTURE) ×3 IMPLANT
SUT ETHILON 3 0 PS 1 (SUTURE) ×14 IMPLANT
SUT FIBERWIRE #2 38 T-5 BLUE (SUTURE)
SUT VIC AB 0 CT1 18XCR BRD 8 (SUTURE) IMPLANT
SUT VIC AB 0 CT1 27 (SUTURE) ×3
SUT VIC AB 0 CT1 27XBRD ANBCTR (SUTURE) IMPLANT
SUT VIC AB 0 CT1 8-18 (SUTURE) ×3
SUT VIC AB 0 CTX 18 (SUTURE) ×2 IMPLANT
SUT VIC AB 1 CT1 18XCR BRD 8 (SUTURE) IMPLANT
SUT VIC AB 1 CT1 27 (SUTURE)
SUT VIC AB 1 CT1 27XBRD ANBCTR (SUTURE) ×1 IMPLANT
SUT VIC AB 1 CT1 8-18 (SUTURE)
SUT VIC AB 2-0 CT1 27 (SUTURE) ×15
SUT VIC AB 2-0 CT1 TAPERPNT 27 (SUTURE) ×2 IMPLANT
SUTURE FIBERWR #2 38 T-5 BLUE (SUTURE) IMPLANT
TOWEL OR 17X26 10 PK STRL BLUE (TOWEL DISPOSABLE) ×6 IMPLANT
WND VAC CANISTER 500ML (MISCELLANEOUS) ×2 IMPLANT

## 2019-01-20 NOTE — Plan of Care (Signed)
?  Problem: Clinical Measurements: ?Goal: Will remain free from infection ?Outcome: Progressing ?  ?

## 2019-01-20 NOTE — Anesthesia Postprocedure Evaluation (Signed)
Anesthesia Post Note  Patient: Evan [name]  Procedure(s) Performed: OPEN REDUCTION INTERNAL FIXATION (ORIF) TIBIAL PLATEAU (Right )     Patient location during evaluation: PACU Anesthesia Type: General Level of consciousness: awake and alert Pain management: pain level controlled Vital Signs Assessment: post-procedure vital signs reviewed and stable Respiratory status: spontaneous breathing, nonlabored ventilation, respiratory function stable and patient connected to nasal cannula oxygen Cardiovascular status: blood pressure returned to baseline and stable Postop Assessment: no apparent nausea or vomiting Anesthetic complications: no    Last Vitals:  Vitals:   01/20/19 1330 01/20/19 1357  BP: (!) 162/85 (!) 164/94  Pulse: 81 79  Resp: 12   Temp:  36.4 C  SpO2: 99% 97%    Last Pain:  Vitals:   01/20/19 1357  TempSrc: Oral  PainSc:                  Chelsey L Woodrum
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
## 2019-01-20 NOTE — Transfer of Care (Signed)
Immediate Anesthesia Transfer of Care Note  Patient: Evan [name]  Procedure(s) Performed: OPEN REDUCTION INTERNAL FIXATION (ORIF) TIBIAL PLATEAU (Right )  Patient Location: PACU  Anesthesia Type:General  Level of Consciousness: awake and patient cooperative  Airway & Oxygen Therapy: Patient Spontanous Breathing  Post-op Assessment: Report given to RN and Post -op Vital signs reviewed and stable  Post vital signs: Reviewed and stable  Last Vitals:  Vitals Value Taken Time  BP 183/102 01/20/2019 12:12 PM  Temp    Pulse 74 01/20/2019 12:12 PM  Resp 11 01/20/2019 12:12 PM  SpO2 98 % 01/20/2019 12:12 PM  Vitals shown include unvalidated device data.  Last Pain:  Vitals:   01/20/19 0610  TempSrc:   PainSc: 2       Patients Stated Pain Goal: 3 (41/36/43 8377)  Complications: No apparent anesthesia complications

## 2019-01-20 NOTE — Interval H&P Note (Signed)
History and Physical Interval Note:  01/20/2019 7:21 AM  Evan [name]  has presented today for surgery, with the diagnosis of Right tibial plateau fracture  The various methods of treatment have been discussed with the patient and family. After consideration of risks, benefits and other options for treatment, the patient has consented to  Procedure(s): OPEN REDUCTION INTERNAL FIXATION (ORIF) TIBIAL PLATEAU (Right) as a surgical intervention .  The patient's history has been reviewed, patient examined, no change in status, stable for surgery.  I have reviewed the patient's chart and labs.  Questions were answered to the patient's satisfaction.     Lennette Bihari P Aftan Vint

## 2019-01-20 NOTE — Progress Notes (Signed)
Spoke with dr Lanetta Inch re bp/orders rec'd

## 2019-01-20 NOTE — Anesthesia Procedure Notes (Signed)
Procedure Name: Intubation Date/Time: 01/20/2019 7:44 AM Performed by: Kathryne Hitch, CRNA Pre-anesthesia Checklist: Patient identified, Emergency Drugs available, Suction available, Patient being monitored and Timeout performed Patient Re-evaluated:Patient Re-evaluated prior to induction Oxygen Delivery Method: Circle system utilized Preoxygenation: Pre-oxygenation with 100% oxygen Induction Type: IV induction Ventilation: Mask ventilation without difficulty Laryngoscope Size: Miller and 2 Grade View: Grade I Tube type: Oral Tube size: 7.5 mm Number of attempts: 1 Airway Equipment and Method: Stylet and Oral airway Placement Confirmation: ETT inserted through vocal cords under direct vision,  positive ETCO2 and breath sounds checked- equal and bilateral Secured at: 22 cm Tube secured with: Tape Dental Injury: Teeth and Oropharynx as per pre-operative assessment

## 2019-01-21 ENCOUNTER — Encounter (HOSPITAL_COMMUNITY): Payer: Self-pay | Admitting: Student

## 2019-01-21 LAB — CBC
HCT: 28.6 % — ABNORMAL LOW (ref 39.0–52.0)
Hemoglobin: 8.9 g/dL — ABNORMAL LOW (ref 13.0–17.0)
MCH: 26.6 pg (ref 26.0–34.0)
MCHC: 31.1 g/dL (ref 30.0–36.0)
MCV: 85.4 fL (ref 80.0–100.0)
PLATELETS: 400 10*3/uL (ref 150–400)
RBC: 3.35 MIL/uL — ABNORMAL LOW (ref 4.22–5.81)
RDW: 14.4 % (ref 11.5–15.5)
WBC: 12.5 10*3/uL — ABNORMAL HIGH (ref 4.0–10.5)
nRBC: 0 % (ref 0.0–0.2)

## 2019-01-21 MED ORDER — OXYCODONE HCL 5 MG PO TABS
10.0000 mg | ORAL_TABLET | ORAL | Status: DC | PRN
  Administered 2019-01-21 – 2019-01-23 (×10): 15 mg via ORAL
  Filled 2019-01-21 (×10): qty 3

## 2019-01-21 MED ORDER — METHOCARBAMOL 750 MG PO TABS
750.0000 mg | ORAL_TABLET | Freq: Three times a day (TID) | ORAL | Status: DC
  Administered 2019-01-21 – 2019-01-26 (×15): 750 mg via ORAL
  Filled 2019-01-21 (×15): qty 1

## 2019-01-21 MED ORDER — METHOCARBAMOL 750 MG PO TABS: 750.0000 mg | ORAL_TABLET | Freq: Three times a day (TID) | ORAL | Status: DC

## 2019-01-21 MED ORDER — METHOCARBAMOL 1000 MG/10ML IJ SOLN
750.0000 mg | Freq: Three times a day (TID) | INTRAVENOUS | Status: DC
  Filled 2019-01-21: qty 7.5

## 2019-01-21 MED ORDER — KETOROLAC TROMETHAMINE 15 MG/ML IJ SOLN
15.0000 mg | Freq: Four times a day (QID) | INTRAMUSCULAR | Status: AC
  Administered 2019-01-21 – 2019-01-22 (×5): 15 mg via INTRAVENOUS
  Filled 2019-01-21 (×5): qty 1

## 2019-01-21 MED ORDER — ACETAMINOPHEN 325 MG PO TABS
650.0000 mg | ORAL_TABLET | Freq: Three times a day (TID) | ORAL | Status: DC
  Administered 2019-01-21 – 2019-01-26 (×14): 650 mg via ORAL
  Filled 2019-01-21 (×14): qty 2

## 2019-01-21 MED ORDER — METHOCARBAMOL 1000 MG/10ML IJ SOLN: 500.0000 mg | Freq: Three times a day (TID) | INTRAVENOUS | Status: DC

## 2019-01-21 MED ORDER — GABAPENTIN 300 MG PO CAPS
300.0000 mg | ORAL_CAPSULE | Freq: Two times a day (BID) | ORAL | Status: DC
  Administered 2019-01-21 – 2019-01-26 (×10): 300 mg via ORAL
  Filled 2019-01-21 (×10): qty 1

## 2019-01-21 NOTE — Progress Notes (Signed)
Orthopedic Tech Progress Note Patient Details:  Evan [name] 1971-05-16 [id number]  Patient ID: Evan [name], male   DOB: 27-Aug-1971, 48 y.o.   MRN: [id number]   Evan [name] 01/21/2019, 8:24 [facility] [employer] for right hinged knee brace.

## 2019-01-21 NOTE — Progress Notes (Signed)
53ml of fentanyl from PCA pump wasted in [place] container with [employer] as a witness.
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
## 2019-01-21 NOTE — Plan of Care (Signed)
  Problem: Education: Goal: Knowledge of General Education information will improve Description: Including pain rating scale, medication(s)/side effects and non-pharmacologic comfort measures Outcome: Progressing   Problem: Clinical Measurements: Goal: Ability to maintain clinical measurements within normal limits will improve Outcome: Progressing   

## 2019-01-21 NOTE — Progress Notes (Signed)
Rehab Admissions Coordinator Note:  Per PT and OT recommendation, this patient was screened by Jhonnie Garner for appropriateness for an Inpatient Acute Rehab Consult.  At this time, we are recommending Inpatient Rehab consult. AC will contact MD regarding IP rehab Consult Order request.   Jhonnie Garner 01/21/2019, 5:00 PM  I can be reached at [phone].

## 2019-01-22 ENCOUNTER — Inpatient Hospital Stay (HOSPITAL_COMMUNITY): Payer: Self-pay

## 2019-01-22 DIAGNOSIS — S82141A Displaced bicondylar fracture of right tibia, initial encounter for closed fracture: Principal | ICD-10-CM

## 2019-01-22 DIAGNOSIS — S82151A Displaced fracture of right tibial tuberosity, initial encounter for closed fracture: Secondary | ICD-10-CM

## 2019-01-22 DIAGNOSIS — D62 Acute posthemorrhagic anemia: Secondary | ICD-10-CM

## 2019-01-22 MED ORDER — MAGNESIUM CITRATE PO SOLN
1.0000 | Freq: Once | ORAL | Status: AC
  Administered 2019-01-22: 1 via ORAL
  Filled 2019-01-22: qty 296
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
## 2019-01-22 NOTE — Progress Notes (Signed)
Inpatient Rehabilitation-Admissions Coordinator   Followed up with pt after PM&R consult. Discussed recommendations from PM&R MD. Pt does not feel he is able to go straight home due to limited mobility. Pt stated an interest in SNF. AC relayed pt preferences to SW/CM.   AC will sign off.   Please call if questions.   Jhonnie Garner, OTR/L  Rehab Admissions Coordinator  [phone] 01/22/2019 4:04 PM
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
## 2019-01-22 NOTE — Plan of Care (Signed)
  Problem: Health Behavior/Discharge Planning: Goal: Ability to manage health-related needs will improve Outcome: Progressing   Problem: Clinical Measurements: Goal: Ability to maintain clinical measurements within normal limits will improve Outcome: Progressing   

## 2019-01-22 NOTE — Progress Notes (Signed)
CSW notified of CIR denial and patient's wishes to go to SNF. Patient is uninsured. CSW spoke with treatment team Claiborne Billings with Rehab admission and Physical Therapist regarding discharge plan in which treatment team is in agreement patient will be discharged home after several days of consecutive physical therapy.   RNCM notified.   [place], [place]

## 2019-01-23 ENCOUNTER — Inpatient Hospital Stay (HOSPITAL_COMMUNITY): Payer: Self-pay

## 2019-01-23 DIAGNOSIS — S83281A Other tear of lateral meniscus, current injury, right knee, initial encounter: Secondary | ICD-10-CM

## 2019-01-23 DIAGNOSIS — M79609 Pain in unspecified limb: Secondary | ICD-10-CM

## 2019-01-23 MED ORDER — OXYCODONE HCL 5 MG PO TABS
20.0000 mg | ORAL_TABLET | ORAL | Status: DC | PRN
  Administered 2019-01-23 – 2019-01-26 (×19): 20 mg via ORAL
  Filled 2019-01-23 (×19): qty 4

## 2019-01-23 MED ORDER — KETOROLAC TROMETHAMINE 15 MG/ML IJ SOLN
15.0000 mg | Freq: Four times a day (QID) | INTRAMUSCULAR | Status: AC
  Administered 2019-01-23 – 2019-01-24 (×5): 15 mg via INTRAVENOUS
  Filled 2019-01-23 (×5): qty 1
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
## 2019-01-23 NOTE — Plan of Care (Signed)
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
## 2019-01-23 NOTE — Progress Notes (Signed)
Right lower extremity venous duplex has been completed. Preliminary results can be found in CV Proc through chart review.   01/23/19 12:52 PM Evan [name] RVT

## 2019-01-24 NOTE — Plan of Care (Signed)
Problem: Health Behavior/Discharge Planning: Goal: Ability to manage health-related needs will improve Outcome: Progressing   Problem: Clinical Measurements: Goal: Ability to maintain clinical measurements within normal limits will improve Outcome: Progressing Goal: Respiratory complications will improve Outcome: Progressing
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
## 2019-01-24 NOTE — Progress Notes (Signed)
SPORTS MEDICINE AND JOINT REPLACEMENT  Lara Mulch, MD    Carlyon Shadow, PA-C [place], [place], [place]  [address]                             [phone]   PROGRESS NOTE  Subjective:  negative for Chest Pain  negative for Shortness of Breath  negative for Nausea/Vomiting   negative for Calf Pain  negative for Bowel Movement   Tolerating Diet: yes         Patient reports pain as 3 on 0-10 scale.    Objective: Vital signs in last 24 hours:    Patient Vitals for the past 24 hrs:  BP Temp Temp src Pulse Resp SpO2  01/24/19 0329 102/63 99.2 F (37.3 C) Oral (!) 105 16 95 %  01/23/19 2114 107/76 97.7 F (36.5 C) Oral 92 20 100 %  01/23/19 1434 109/68 97.9 F (36.6 C) Oral 93 18 98 %    @flow {1959:LAST@   Intake/Output from previous day:   01/31 0701 - 02/01 0700 In: 720 [P.O.:720] Out: -    Intake/Output this shift:   02/01 0701 - 02/01 1900 In: 360 [P.O.:360] Out: -    Intake/Output      01/31 0701 - 02/01 0700 02/01 0701 - 02/02 0700   P.O. 720 360   Total Intake(mL/kg) 720 (7.2) 360 (3.6)   Urine (mL/kg/hr)     Total Output     Net +720 +360           LABORATORY DATA: Recent Labs    01/19/19 0544 01/21/19 0322  WBC 10.4 12.5*  HGB 9.7* 8.9*  HCT 31.4* 28.6*  PLT 284 400   Recent Labs    01/19/19 0544 01/19/19 1113  NA 133* 134*  K 6.2* 4.6  CL 97* 101  CO2 23 26  BUN 26* 21*  CREATININE 1.08 1.11  GLUCOSE 74 90  CALCIUM 8.5* 8.4*   Lab Results  Component Value Date   INR 0.85 08/31/2011   INR 0.8 07/15/2008    Examination:  General appearance: alert, cooperative and no distress Extremities: extremities normal, atraumatic, no cyanosis or edema  Wound Exam: clean, dry, intact   Drainage:  None: wound tissue dry  Motor Exam: Quadriceps and Hamstrings Intact  Sensory Exam: Superficial Peroneal, Deep Peroneal and Tibial normal   Assessment:    4 Days Post-Op  Procedure(s) (LRB): OPEN REDUCTION INTERNAL FIXATION  (ORIF) TIBIAL PLATEAU (Right)  ADDITIONAL DIAGNOSIS:  Active Problems:   Closed bicondylar fracture of right tibial plateau   Displaced fracture of right tibial tuberosity, initial encounter for closed fracture   Acute lateral meniscus tear of right knee     Plan: Physical Therapy as ordered Non Weight Bearing (NWB)  DVT Prophylaxis:  Lovenox  DISCHARGE PLAN: likely monday  Follow up plan TBD, was denied CIR  Donia Ast 01/24/2019, 8:37 AM

## 2019-01-24 NOTE — Progress Notes (Signed)
OT Cancellation Note  Patient Details Name: JCEON ALVERIO MRN: [id number] DOB: [birth year]   Cancelled Treatment:    Reason Eval/Treat Not Completed: Other (comment). Pt. Declined skilled OT session this am secondary to c/o nausea.  Will check back as able.    Janice Coffin, COTA/L 01/24/2019, 10:00 AM

## 2019-01-25 ENCOUNTER — Other Ambulatory Visit: Payer: Self-pay | Admitting: Orthopedic Surgery

## 2019-01-25 ENCOUNTER — Inpatient Hospital Stay (HOSPITAL_COMMUNITY): Payer: Self-pay

## 2019-01-25 ENCOUNTER — Encounter (HOSPITAL_COMMUNITY): Payer: Self-pay | Admitting: *Deleted

## 2019-01-25 DIAGNOSIS — S82141D Displaced bicondylar fracture of right tibia, subsequent encounter for closed fracture with routine healing: Secondary | ICD-10-CM

## 2019-01-25 MED ORDER — IBUPROFEN 200 MG PO TABS
800.0000 mg | ORAL_TABLET | Freq: Four times a day (QID) | ORAL | Status: DC | PRN
  Administered 2019-01-25: 800 mg via ORAL
  Filled 2019-01-25: qty 4
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
## 2019-01-25 NOTE — Progress Notes (Signed)
SPORTS MEDICINE AND JOINT REPLACEMENT  Lara Mulch, MD    Carlyon Shadow, PA-C [place], [place], [place]  [address]                             [phone]   PROGRESS NOTE  Subjective:  negative for Chest Pain  negative for Shortness of Breath  negative for Nausea/Vomiting   negative for Calf Pain  negative for Bowel Movement   Tolerating Diet: yes         Patient reports pain as 3 on 0-10 scale.    Objective: Vital signs in last 24 hours:    Patient Vitals for the past 24 hrs:  BP Temp Temp src Pulse Resp SpO2  01/25/19 0905 (!) 160/77 98.4 F (36.9 C) Oral (!) 115 18 98 %  01/25/19 0338 138/73 99.5 F (37.5 C) Oral (!) 101 16 100 %  01/24/19 1936 116/85 98.3 F (36.8 C) Oral 80 17 99 %  01/24/19 1240 105/74 98 F (36.7 C) Oral 88 18 98 %    @flow {1959:LAST@   Intake/Output from previous day:   02/01 0701 - 02/02 0700 In: 1080 [P.O.:1080] Out: 750 [Urine:750]   Intake/Output this shift:   02/02 0701 - 02/02 1900 In: -  Out: 855 [Urine:855]   Intake/Output      02/01 0701 - 02/02 0700 02/02 0701 - 02/03 0700   P.O. 1080    Total Intake(mL/kg) 1080 (10.8)    Urine (mL/kg/hr) 750 (0.3) 855 (4)   Stool 0    Total Output 750 855   Net +330 -855        Urine Occurrence 1 x    Stool Occurrence 1 x       LABORATORY DATA: Recent Labs    01/19/19 0544 01/21/19 0322  WBC 10.4 12.5*  HGB 9.7* 8.9*  HCT 31.4* 28.6*  PLT 284 400   Recent Labs    01/19/19 0544 01/19/19 1113  NA 133* 134*  K 6.2* 4.6  CL 97* 101  CO2 23 26  BUN 26* 21*  CREATININE 1.08 1.11  GLUCOSE 74 90  CALCIUM 8.5* 8.4*   Lab Results  Component Value Date   INR 0.85 08/31/2011   INR 0.8 07/15/2008    Examination:  General appearance: alert, cooperative and no distress Extremities: extremities normal, atraumatic, no cyanosis or edema  Wound Exam: clean, dry, intact   Drainage:  None: wound tissue dry  Motor Exam: Quadriceps and Hamstrings  Intact  Sensory Exam: Superficial Peroneal, Deep Peroneal and Tibial normal   Assessment:    5 Days Post-Op  Procedure(s) (LRB): OPEN REDUCTION INTERNAL FIXATION (ORIF) TIBIAL PLATEAU (Right)  ADDITIONAL DIAGNOSIS:  Active Problems:   Closed bicondylar fracture of right tibial plateau   Displaced fracture of right tibial tuberosity, initial encounter for closed fracture   Acute lateral meniscus tear of right knee     Plan: Physical Therapy as ordered Non Weight Bearing (NWB)  DVT Prophylaxis:  Lovenox  DISCHARGE PLAN: likely monday  DISCHARGE NEEDS: follow up plan TBD per Dr Doreatha Martin        Donia Ast 01/25/2019, 9:08 AM

## 2019-01-25 NOTE — Plan of Care (Signed)
  Problem: Health Behavior/Discharge Planning: Goal: Ability to manage health-related needs will improve Outcome: Progressing   Problem: Clinical Measurements: Goal: Ability to maintain clinical measurements within normal limits will improve Outcome: Progressing Goal: Respiratory complications will improve Outcome: Progressing Goal: Cardiovascular complication will be avoided Outcome: Progressing   

## 2019-01-26 MED ORDER — POLYETHYLENE GLYCOL 3350 17 G PO PACK
17.0000 g | PACK | Freq: Every day | ORAL | Status: DC
  Filled 2019-01-26: qty 1

## 2019-01-26 MED ORDER — METHOCARBAMOL 750 MG PO TABS: 750.0000 mg | ORAL_TABLET | Freq: Three times a day (TID) | ORAL | 0 refills | Status: DC

## 2019-01-26 MED ORDER — OXYCODONE HCL 20 MG PO TABS: 20.0000 mg | ORAL_TABLET | ORAL | 0 refills | Status: DC | PRN

## 2019-01-26 MED ORDER — MAGNESIUM CITRATE PO SOLN: 1.0000 | Freq: Every day | ORAL | Status: DC | PRN

## 2019-01-26 MED ORDER — SULFAMETHOXAZOLE-TRIMETHOPRIM 800-160 MG PO TABS: 1.0000 | ORAL_TABLET | Freq: Two times a day (BID) | ORAL | 0 refills | Status: DC

## 2019-01-26 MED ORDER — SULFAMETHOXAZOLE-TRIMETHOPRIM 800-160 MG PO TABS
1.0000 | ORAL_TABLET | Freq: Once | ORAL | Status: AC
  Administered 2019-01-26: 1 via ORAL
  Filled 2019-01-26: qty 1

## 2019-01-26 MED ORDER — ACETAMINOPHEN 325 MG PO TABS
650.0000 mg | ORAL_TABLET | Freq: Three times a day (TID) | ORAL | Status: DC
  Administered 2019-01-26: 650 mg via ORAL
  Filled 2019-01-26: qty 2

## 2019-01-26 MED ORDER — ASPIRIN EC 325 MG PO TBEC: 325.0000 mg | DELAYED_RELEASE_TABLET | Freq: Every day | ORAL | 0 refills | Status: AC

## 2019-01-26 MED ORDER — DOCUSATE SODIUM 100 MG PO CAPS
100.0000 mg | ORAL_CAPSULE | Freq: Two times a day (BID) | ORAL | Status: DC
  Filled 2019-01-26: qty 1
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
## 2019-01-26 NOTE — Plan of Care (Signed)
Problem: Health Behavior/Discharge Planning: Goal: Ability to manage health-related needs will improve Outcome: Progressing   Problem: Clinical Measurements: Goal: Ability to maintain clinical measurements within normal limits will improve Outcome: Progressing Goal: Respiratory complications will improve Outcome: Progressing   Problem: Education: Goal: Knowledge of the prescribed therapeutic regimen will improve Outcome: Progressing   Problem: Physical Regulation: Goal: Postoperative complications will be avoided or minimized Outcome: Progressing   Problem: Pain Management: Goal: Pain level will decrease with appropriate interventions Outcome: Progressing   Problem: Skin Integrity: Goal: Will show signs of wound healing Outcome: Progressing

## 2019-01-26 NOTE — Progress Notes (Signed)
PTAR here as of this writing for patient discharge/transport;vital signs stable and pt stated "i'm ready to go home"

## 2019-01-26 NOTE — Progress Notes (Signed)
CSW consulted with patient regarding dc plan, although it is recommended SNF due to patient's lack of insurance it would be $300/day out of pocket for SNF. Patient stated he did not have this and would not be able to afford SNF. Patient also reported not applying for any insurance recently. CSW was given permission to speak with patient's parents regarding dc plan.   CSW spoke with patient's father Evan [name] who reported patient's home has poor living conditions with many roommates and very cluttered, would not be safe for recovery. CSW inquired if patient would be able to stay with his parents during rehab, or for at least a week until gaining more mobility.   Conclusion: Patient family and patient are in agreement with patient staying with parents at discharge for continued support, supervision, and a home environment that is conducive to recovery.   RNCM notified, [name] signing off.   [name], [name]

## 2019-01-26 NOTE — Care Management Note (Signed)
Case Management Note  Patient Details  Name: Evan [name] MRN: [id number] Date of Birth: [birth year]  Subjective/Objective:                    Action/Plan: Case manager and social worker have spoken with patient's dad, [name]- [phone], concerning patient's discharge. They are agreeable to patient being discharged to their home for a week, and then he will return to his apartment in [place]. Patient will discharge via PTAR to [place], [place], [place] also contacted [name], [name] and updated her on patient's discharge plan. [facility] will not see patient until he returns to [place]. Case Manager will inform patient that he will have to do exercises as demonstrated by therapy while he is in [place]   Expected Discharge Date:    01/26/19              Expected Discharge Plan:  [place]  In-House Referral:  NA  Discharge planning Services  CM Consult  Post Acute Care Choice:  Durable Medical Equipment, Home Health Choice offered to:  Patient  DME Arranged:  3-N-1, Walker rolling DME Agency:  [place]:  PT [place]:  [facility]  Status of Service:  Completed, signed off  If discussed at [employer], dates discussed:    Additional Comments:  [name], RN 01/26/2019, 1:30 PM
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
## 2019-01-26 NOTE — Discharge Instructions (Signed)
Orthopaedic Trauma Service Discharge Instructions   General Discharge Instructions  WEIGHT BEARING STATUS: Non-weightbearing right lower extremity  RANGE OF MOTION/ACTIVITY: Unrestricted range of motion of right knee in hinge knee brace. Brace should be locked in full extension at night. Can be unlocked to work on range of motion during the day  Wound Care: Incisions can be left open to air if not drainage. Okay to shower and get incisions and sutures wet.   DVT/PE prophylaxis: Aspirin 325 mg  Diet: as you were eating previously.  Can use over the counter stool softeners and bowel preparations, such as Miralax, to help with bowel movements.  Narcotics can be constipating.  Be sure to drink plenty of fluids  PAIN MEDICATION USE AND EXPECTATIONS  You have likely been given narcotic medications to help control your pain.  After a traumatic event that results in an fracture (broken bone) with or without surgery, it is ok to use narcotic pain medications to help control one's pain.  We understand that everyone responds to pain differently and each individual patient will be evaluated on a regular basis for the continued need for narcotic medications. Ideally, narcotic medication use should last no more than 6-8 weeks (coinciding with fracture healing).   As a patient it is your responsibility as well to monitor narcotic medication use and report the amount and frequency you use these medications when you come to your office visit.   We would also advise that if you are using narcotic medications, you should take a dose prior to therapy to maximize you participation.  IF YOU ARE ON NARCOTIC MEDICATIONS IT IS NOT PERMISSIBLE TO OPERATE A MOTOR VEHICLE (MOTORCYCLE/CAR/TRUCK/MOPED) OR HEAVY MACHINERY DO NOT MIX NARCOTICS WITH OTHER CNS (CENTRAL NERVOUS SYSTEM) DEPRESSANTS SUCH AS ALCOHOL   STOP SMOKING OR USING NICOTINE PRODUCTS!!!!  As discussed nicotine severely impairs your body's ability to  heal surgical and traumatic wounds but also impairs bone healing.  Wounds and bone heal by forming microscopic blood vessels (angiogenesis) and nicotine is a vasoconstrictor (essentially, shrinks blood vessels).  Therefore, if vasoconstriction occurs to these microscopic blood vessels they essentially disappear and are unable to deliver necessary nutrients to the healing tissue.  This is one modifiable factor that you can do to dramatically increase your chances of healing your injury.    (This means no smoking, no nicotine gum, patches, etc)  DO NOT USE NONSTEROIDAL ANTI-INFLAMMATORY DRUGS (NSAID'S)  Using products such as Advil (ibuprofen), Aleve (naproxen), Motrin (ibuprofen) for additional pain control during fracture healing can delay and/or prevent the healing response.  If you would like to take over the counter (OTC) medication, Tylenol (acetaminophen) is ok.  However, some narcotic medications that are given for pain control contain acetaminophen as well. Therefore, you should not exceed more than 4000 mg of tylenol in a day if you do not have liver disease.  Also note that there are may OTC medicines, such as cold medicines and allergy medicines that my contain tylenol as well.  If you have any questions about medications and/or interactions please ask your doctor/PA or your pharmacist.      ICE AND ELEVATE INJURED/OPERATIVE EXTREMITY  Using ice and elevating the injured extremity above your heart can help with swelling and pain control.  Icing in a pulsatile fashion, such as 20 minutes on and 20 minutes off, can be followed.    Do not place ice directly on skin. Make sure there is a barrier between to skin and the  ice pack.    Using frozen items such as frozen peas works well as the conform nicely to the are that needs to be iced.  USE AN ACE WRAP OR TED HOSE FOR SWELLING CONTROL  In addition to icing and elevation, Ace wraps or TED hose are used to help limit and resolve swelling.  It is  recommended to use Ace wraps or TED hose until you are informed to stop.    When using Ace Wraps start the wrapping distally (farthest away from the body) and wrap proximally (closer to the body)   Example: If you had surgery on your leg or thing and you do not have a splint on, start the ace wrap at the toes and work your way up to the thigh        If you had surgery on your upper extremity and do not have a splint on, start the ace wrap at your fingers and work your way up to the upper arm   [place]: [phone]        Discharge Wound Care Instructions  Do NOT apply any ointments, solutions or lotions to pin sites or surgical wounds.  These prevent needed drainage and even though solutions like hydrogen peroxide kill bacteria, they also damage cells lining the pin sites that help fight infection.  Applying lotions or ointments can keep the wounds moist and can cause them to breakdown and open up as well. This can increase the risk for infection. When in doubt call the office.  Surgical incisions should be dressed daily.  If any drainage is noted, use one layer of adaptic, then gauze, Kerlix, and an ace wrap.  Once the incision is completely dry and without drainage, it may be left open to air out.  Showering may begin 36-48 hours later.  Cleaning gently with soap and water.  Traumatic wounds should be dressed daily as well.    One layer of adaptic, gauze, Kerlix, then ace wrap.  The adaptic can be discontinued once the draining has ceased    If you have a wet to dry dressing: wet the gauze with saline the squeeze as much saline out so the gauze is moist (not soaking wet), place moistened gauze over wound, then place a dry gauze over the moist one, followed by Kerlix wrap, then ace wrap.

## 2019-01-26 NOTE — Care Management (Signed)
Case manager spoke with patient concerning discharge plan.He is not doing well with therapies and PT has recommended SNF.  Also spoke with His father- with patient's permission. Patient said that his parents could help him at discharge, but that is not the case. Mr. Airik, Goodlin) says that they live in [facility] and they are not able to assist patient and they also do not feel he is safe to be at his residence. Patient also said his roommates could help, but his dad said that is not the case. Case manager has updated unit social worker that patient will need SNF for shortterm rehab.

## 2019-01-26 NOTE — Progress Notes (Addendum)
1722: AVS given and reviewed with pt. Medications discussed and reviewed. All questions answered to satisfaction. BSC and walker at bedside. Awaiting pt's parents to pick up equipment. Awaiting PTAR for transportation. Will continue to monitor.  1751: Pt's father at bedside and picked up pt's BSC, walker, and pt belongings.   1832: PTAR at bedside. Pt shaking and complaining of chills. Oral temp 98.5. MEWS score green at 0, Sepsis score green at 3. Haddix, MD on phone with this RN during event. Per Haddix, MD okay to admin PRN xanax. Nursing will continue to monitor. Nightshift RN to re-contact PTAR for transport if pt improvement.  1931: During bedside report, pt resting comfortably in bed and stated "I am feeling much better now and ready to go home". This RN contacted and notified PTAR. Awaiting PTAR for transportation.

## 2019-04-16 ENCOUNTER — Encounter (HOSPITAL_COMMUNITY): Payer: Self-pay | Admitting: *Deleted

## 2019-04-16 ENCOUNTER — Other Ambulatory Visit: Payer: Self-pay

## 2019-04-16 NOTE — Progress Notes (Addendum)
Mr Evan [name] denies chest pain or shortness of breath.  Patient denies that he or his family has experienced any of the following: Cough Fever >100.4 Runny Nose Sore Throat Difficulty breathing/ shortness of breath Travel in past 14 days- no  Patient was a patient of Dr Jenny Reichmann, "I now need a new a doctor."
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
## 2019-04-17 ENCOUNTER — Other Ambulatory Visit: Payer: Self-pay

## 2019-04-17 ENCOUNTER — Inpatient Hospital Stay (HOSPITAL_COMMUNITY): Payer: Self-pay

## 2019-04-17 ENCOUNTER — Encounter (HOSPITAL_COMMUNITY): Payer: Self-pay

## 2019-04-17 ENCOUNTER — Inpatient Hospital Stay (HOSPITAL_COMMUNITY): Payer: Self-pay | Admitting: Anesthesiology

## 2019-04-17 ENCOUNTER — Inpatient Hospital Stay (HOSPITAL_COMMUNITY)
Admission: RE | Admit: 2019-04-17 | Discharge: 2019-05-09 | DRG: 464 | Disposition: A | Discharge status: Home / Self Care | Payer: Self-pay | Attending: Student | Admitting: Student

## 2019-04-17 ENCOUNTER — Encounter (HOSPITAL_COMMUNITY)
Admission: RE | Disposition: A | Discharge status: Home / Self Care | Payer: Self-pay | Source: Home / Self Care | Attending: Student

## 2019-04-17 DIAGNOSIS — T8140XA Infection following a procedure, unspecified, initial encounter: Secondary | ICD-10-CM | POA: Diagnosis present

## 2019-04-17 DIAGNOSIS — Z87891 Personal history of nicotine dependence: Secondary | ICD-10-CM

## 2019-04-17 DIAGNOSIS — D62 Acute posthemorrhagic anemia: Secondary | ICD-10-CM | POA: Diagnosis not present

## 2019-04-17 DIAGNOSIS — L02415 Cutaneous abscess of right lower limb: Secondary | ICD-10-CM

## 2019-04-17 DIAGNOSIS — G479 Sleep disorder, unspecified: Secondary | ICD-10-CM | POA: Diagnosis present

## 2019-04-17 DIAGNOSIS — L84 Corns and callosities: Secondary | ICD-10-CM | POA: Diagnosis not present

## 2019-04-17 DIAGNOSIS — R5383 Other fatigue: Secondary | ICD-10-CM | POA: Diagnosis not present

## 2019-04-17 DIAGNOSIS — Z85820 Personal history of malignant melanoma of skin: Secondary | ICD-10-CM

## 2019-04-17 DIAGNOSIS — Z808 Family history of malignant neoplasm of other organs or systems: Secondary | ICD-10-CM

## 2019-04-17 DIAGNOSIS — T847XXA Infection and inflammatory reaction due to other internal orthopedic prosthetic devices, implants and grafts, initial encounter: Secondary | ICD-10-CM

## 2019-04-17 DIAGNOSIS — Z91041 Radiographic dye allergy status: Secondary | ICD-10-CM

## 2019-04-17 DIAGNOSIS — B1920 Unspecified viral hepatitis C without hepatic coma: Secondary | ICD-10-CM | POA: Diagnosis present

## 2019-04-17 DIAGNOSIS — Z419 Encounter for procedure for purposes other than remedying health state, unspecified: Secondary | ICD-10-CM

## 2019-04-17 DIAGNOSIS — Z5181 Encounter for therapeutic drug level monitoring: Secondary | ICD-10-CM

## 2019-04-17 DIAGNOSIS — Z978 Presence of other specified devices: Secondary | ICD-10-CM

## 2019-04-17 DIAGNOSIS — S82142A Displaced bicondylar fracture of left tibia, initial encounter for closed fracture: Secondary | ICD-10-CM

## 2019-04-17 DIAGNOSIS — M25461 Effusion, right knee: Secondary | ICD-10-CM | POA: Diagnosis present

## 2019-04-17 DIAGNOSIS — G629 Polyneuropathy, unspecified: Secondary | ICD-10-CM | POA: Diagnosis present

## 2019-04-17 DIAGNOSIS — F988 Other specified behavioral and emotional disorders with onset usually occurring in childhood and adolescence: Secondary | ICD-10-CM | POA: Diagnosis present

## 2019-04-17 DIAGNOSIS — Z8619 Personal history of other infectious and parasitic diseases: Secondary | ICD-10-CM

## 2019-04-17 DIAGNOSIS — Z91013 Allergy to seafood: Secondary | ICD-10-CM

## 2019-04-17 DIAGNOSIS — F419 Anxiety disorder, unspecified: Secondary | ICD-10-CM | POA: Diagnosis present

## 2019-04-17 DIAGNOSIS — Z8572 Personal history of non-Hodgkin lymphomas: Secondary | ICD-10-CM

## 2019-04-17 DIAGNOSIS — S82141K Displaced bicondylar fracture of right tibia, subsequent encounter for closed fracture with nonunion: Secondary | ICD-10-CM

## 2019-04-17 DIAGNOSIS — G894 Chronic pain syndrome: Secondary | ICD-10-CM | POA: Diagnosis present

## 2019-04-17 DIAGNOSIS — M21861 Other specified acquired deformities of right lower leg: Secondary | ICD-10-CM | POA: Diagnosis present

## 2019-04-17 DIAGNOSIS — D759 Disease of blood and blood-forming organs, unspecified: Secondary | ICD-10-CM | POA: Diagnosis present

## 2019-04-17 DIAGNOSIS — I10 Essential (primary) hypertension: Secondary | ICD-10-CM | POA: Diagnosis present

## 2019-04-17 DIAGNOSIS — N529 Male erectile dysfunction, unspecified: Secondary | ICD-10-CM | POA: Diagnosis present

## 2019-04-17 DIAGNOSIS — F329 Major depressive disorder, single episode, unspecified: Secondary | ICD-10-CM | POA: Diagnosis present

## 2019-04-17 DIAGNOSIS — M00861 Arthritis due to other bacteria, right knee: Secondary | ICD-10-CM | POA: Diagnosis present

## 2019-04-17 DIAGNOSIS — Z79899 Other long term (current) drug therapy: Secondary | ICD-10-CM

## 2019-04-17 DIAGNOSIS — Z800 Family history of malignant neoplasm of digestive organs: Secondary | ICD-10-CM

## 2019-04-17 DIAGNOSIS — F1910 Other psychoactive substance abuse, uncomplicated: Secondary | ICD-10-CM | POA: Diagnosis present

## 2019-04-17 DIAGNOSIS — Z7982 Long term (current) use of aspirin: Secondary | ICD-10-CM

## 2019-04-17 DIAGNOSIS — S82141A Displaced bicondylar fracture of right tibia, initial encounter for closed fracture: Secondary | ICD-10-CM

## 2019-04-17 DIAGNOSIS — T84622A Infection and inflammatory reaction due to internal fixation device of right tibia, initial encounter: Principal | ICD-10-CM | POA: Diagnosis present

## 2019-04-17 DIAGNOSIS — S82201K Unspecified fracture of shaft of right tibia, subsequent encounter for closed fracture with nonunion: Secondary | ICD-10-CM

## 2019-04-17 DIAGNOSIS — Z923 Personal history of irradiation: Secondary | ICD-10-CM

## 2019-04-17 DIAGNOSIS — F909 Attention-deficit hyperactivity disorder, unspecified type: Secondary | ICD-10-CM | POA: Diagnosis present

## 2019-04-17 DIAGNOSIS — M009 Pyogenic arthritis, unspecified: Secondary | ICD-10-CM | POA: Diagnosis present

## 2019-04-17 HISTORY — DX: Polyneuropathy, unspecified: G62.9

## 2019-04-17 HISTORY — PX: IRRIGATION AND DEBRIDEMENT KNEE: SHX5185

## 2019-04-17 HISTORY — PX: HARDWARE REMOVAL: SHX979

## 2019-04-17 LAB — CBC WITH DIFFERENTIAL/PLATELET
Abs Immature Granulocytes: 0.02 10*3/uL (ref 0.00–0.07)
Basophils Absolute: 0 10*3/uL (ref 0.0–0.1)
Basophils Relative: 0 %
Eosinophils Absolute: 0.2 10*3/uL (ref 0.0–0.5)
Eosinophils Relative: 2 %
HCT: 31.1 % — ABNORMAL LOW (ref 39.0–52.0)
Hemoglobin: 9 g/dL — ABNORMAL LOW (ref 13.0–17.0)
Immature Granulocytes: 0 %
Lymphocytes Relative: 23 %
Lymphs Abs: 1.4 10*3/uL (ref 0.7–4.0)
MCH: 24.6 pg — ABNORMAL LOW (ref 26.0–34.0)
MCHC: 28.9 g/dL — ABNORMAL LOW (ref 30.0–36.0)
MCV: 85 fL (ref 80.0–100.0)
Monocytes Absolute: 0.6 10*3/uL (ref 0.1–1.0)
Monocytes Relative: 10 %
Neutro Abs: 4 10*3/uL (ref 1.7–7.7)
Neutrophils Relative %: 65 %
Platelets: 363 10*3/uL (ref 150–400)
RBC: 3.66 MIL/uL — ABNORMAL LOW (ref 4.22–5.81)
RDW: 14.6 % (ref 11.5–15.5)
WBC: 6.2 10*3/uL (ref 4.0–10.5)
nRBC: 0 % (ref 0.0–0.2)

## 2019-04-17 LAB — COMPREHENSIVE METABOLIC PANEL
ALT: 9 U/L (ref 0–44)
AST: 13 U/L — ABNORMAL LOW (ref 15–41)
Albumin: 2.4 g/dL — ABNORMAL LOW (ref 3.5–5.0)
Alkaline Phosphatase: 72 U/L (ref 38–126)
Anion gap: 11 (ref 5–15)
BUN: 18 mg/dL (ref 6–20)
CO2: 23 mmol/L (ref 22–32)
Calcium: 8.8 mg/dL — ABNORMAL LOW (ref 8.9–10.3)
Chloride: 102 mmol/L (ref 98–111)
Creatinine, Ser: 0.85 mg/dL (ref 0.61–1.24)
GFR calc Af Amer: >60 mL/min (ref >60)
GFR calc non Af Amer: >60 mL/min (ref >60)
Glucose, Bld: 90 mg/dL (ref 70–99)
Potassium: 4.6 mmol/L (ref 3.5–5.1)
Sodium: 136 mmol/L (ref 135–145)
Total Bilirubin: 0.3 mg/dL (ref 0.3–1.2)
Total Protein: 6.2 g/dL — ABNORMAL LOW (ref 6.5–8.1)

## 2019-04-17 LAB — PROTIME-INR
INR: 1 (ref 0.8–1.2)
Prothrombin Time: 12.6 seconds (ref 11.4–15.2)

## 2019-04-17 LAB — HEMOGLOBIN A1C
Hgb A1c MFr Bld: 5.6 % (ref 4.8–5.6)
Mean Plasma Glucose: 114.02 mg/dL

## 2019-04-17 LAB — TSH: TSH: 5.288 u[IU]/mL — ABNORMAL HIGH (ref 0.350–4.500)

## 2019-04-17 LAB — SEDIMENTATION RATE: Sed Rate: 75 mm/hr — ABNORMAL HIGH (ref 0–16)

## 2019-04-17 LAB — C-REACTIVE PROTEIN: CRP: 15.4 mg/dL — ABNORMAL HIGH (ref <1.0)

## 2019-04-17 SURGERY — IRRIGATION AND DEBRIDEMENT KNEE
Anesthesia: General | Laterality: Right

## 2019-04-17 MED ORDER — VANCOMYCIN HCL IN DEXTROSE 1-5 GM/200ML-% IV SOLN
INTRAVENOUS | Status: AC
  Filled 2019-04-17: qty 200

## 2019-04-17 MED ORDER — CHLORHEXIDINE GLUCONATE 4 % EX LIQD: 60.0000 mL | Freq: Once | CUTANEOUS | Status: DC

## 2019-04-17 MED ORDER — ASPIRIN 325 MG PO TABS
325.0000 mg | ORAL_TABLET | Freq: Every day | ORAL | Status: DC
  Administered 2019-04-18 – 2019-05-08 (×21): 325 mg via ORAL
  Filled 2019-04-17 (×21): qty 1

## 2019-04-17 MED ORDER — SUCCINYLCHOLINE CHLORIDE 200 MG/10ML IV SOSY
PREFILLED_SYRINGE | INTRAVENOUS | Status: AC
  Filled 2019-04-17: qty 10

## 2019-04-17 MED ORDER — HYDROMORPHONE HCL 1 MG/ML IJ SOLN
INTRAMUSCULAR | Status: AC
  Administered 2019-04-17: 13:00:00 0.5 mg via INTRAVENOUS
  Filled 2019-04-17: qty 1

## 2019-04-17 MED ORDER — SUCCINYLCHOLINE CHLORIDE 200 MG/10ML IV SOSY
PREFILLED_SYRINGE | INTRAVENOUS | Status: DC | PRN
  Administered 2019-04-17: 150 mg via INTRAVENOUS

## 2019-04-17 MED ORDER — ZOLPIDEM TARTRATE 5 MG PO TABS
10.0000 mg | ORAL_TABLET | Freq: Every evening | ORAL | Status: DC | PRN
  Administered 2019-04-18 – 2019-04-23 (×7): 10 mg via ORAL
  Filled 2019-04-17 (×7): qty 2

## 2019-04-17 MED ORDER — HYDROMORPHONE HCL 1 MG/ML IJ SOLN
INTRAMUSCULAR | Status: AC
  Administered 2019-04-17: 12:00:00 0.5 mg via INTRAVENOUS
  Filled 2019-04-17: qty 1

## 2019-04-17 MED ORDER — PROPOFOL 10 MG/ML IV BOLUS
INTRAVENOUS | Status: DC | PRN
  Administered 2019-04-17: 200 mg via INTRAVENOUS

## 2019-04-17 MED ORDER — VANCOMYCIN HCL 10 G IV SOLR
2000.0000 mg | Freq: Two times a day (BID) | INTRAVENOUS | Status: DC
  Administered 2019-04-17 – 2019-04-19 (×4): 2000 mg via INTRAVENOUS
  Filled 2019-04-17 (×6): qty 2000

## 2019-04-17 MED ORDER — MIDAZOLAM HCL 5 MG/5ML IJ SOLN
INTRAMUSCULAR | Status: DC | PRN
  Administered 2019-04-17: 2 mg via INTRAVENOUS

## 2019-04-17 MED ORDER — ROCURONIUM BROMIDE 50 MG/5ML IV SOSY
PREFILLED_SYRINGE | INTRAVENOUS | Status: AC
  Filled 2019-04-17: qty 5

## 2019-04-17 MED ORDER — PROPOFOL 10 MG/ML IV BOLUS
INTRAVENOUS | Status: AC
  Filled 2019-04-17: qty 20

## 2019-04-17 MED ORDER — QUETIAPINE FUMARATE 50 MG PO TABS
100.0000 mg | ORAL_TABLET | Freq: Every day | ORAL | Status: DC
  Administered 2019-04-17: 21:00:00 100 mg via ORAL
  Filled 2019-04-17: qty 2

## 2019-04-17 MED ORDER — TOBRAMYCIN SULFATE 1.2 G IJ SOLR
INTRAMUSCULAR | Status: AC
  Filled 2019-04-17: qty 1.2

## 2019-04-17 MED ORDER — LACTATED RINGERS IV SOLN
INTRAVENOUS | Status: DC
  Administered 2019-04-17: 10:00:00 via INTRAVENOUS

## 2019-04-17 MED ORDER — HYDROMORPHONE HCL 1 MG/ML IJ SOLN
INTRAMUSCULAR | Status: AC
  Filled 2019-04-17: qty 0.5

## 2019-04-17 MED ORDER — LIDOCAINE 2% (20 MG/ML) 5 ML SYRINGE
INTRAMUSCULAR | Status: AC
  Filled 2019-04-17: qty 5

## 2019-04-17 MED ORDER — SODIUM CHLORIDE 0.9 % IR SOLN
Status: DC | PRN
  Administered 2019-04-17 (×2): 3000 mL

## 2019-04-17 MED ORDER — RIFAMPIN 300 MG PO CAPS
300.0000 mg | ORAL_CAPSULE | Freq: Two times a day (BID) | ORAL | Status: AC
  Administered 2019-04-17 – 2019-05-08 (×43): 300 mg via ORAL
  Filled 2019-04-17 (×43): qty 1

## 2019-04-17 MED ORDER — ALPRAZOLAM 0.5 MG PO TABS
1.0000 mg | ORAL_TABLET | Freq: Three times a day (TID) | ORAL | Status: DC
  Administered 2019-04-17 – 2019-05-09 (×65): 1 mg via ORAL
  Filled 2019-04-17 (×67): qty 2

## 2019-04-17 MED ORDER — SODIUM CHLORIDE 0.9 % IV SOLN
2.0000 g | INTRAVENOUS | Status: AC
  Administered 2019-04-18 – 2019-05-08 (×21): 2 g via INTRAVENOUS
  Filled 2019-04-17 (×22): qty 20

## 2019-04-17 MED ORDER — DEXMEDETOMIDINE HCL IN NACL 200 MCG/50ML IV SOLN
INTRAVENOUS | Status: DC | PRN
  Administered 2019-04-17 (×4): 8 ug via INTRAVENOUS

## 2019-04-17 MED ORDER — SODIUM CHLORIDE 0.9 % IV SOLN
2.0000 g | INTRAVENOUS | Status: DC
  Filled 2019-04-17: qty 20

## 2019-04-17 MED ORDER — VANCOMYCIN HCL 1000 MG IV SOLR
INTRAVENOUS | Status: DC | PRN
  Administered 2019-04-17: 11:00:00 1000 mg via INTRAVENOUS

## 2019-04-17 MED ORDER — FENTANYL CITRATE (PF) 250 MCG/5ML IJ SOLN
INTRAMUSCULAR | Status: AC
  Filled 2019-04-17: qty 5

## 2019-04-17 MED ORDER — OXYCODONE HCL 5 MG PO TABS
ORAL_TABLET | ORAL | Status: AC
  Administered 2019-04-17: 13:00:00 10 mg via ORAL
  Filled 2019-04-17: qty 2

## 2019-04-17 MED ORDER — 0.9 % SODIUM CHLORIDE (POUR BTL) OPTIME
TOPICAL | Status: DC | PRN
  Administered 2019-04-17: 10:00:00 1000 mL

## 2019-04-17 MED ORDER — POVIDONE-IODINE 10 % EX SWAB: 2.0000 "application " | Freq: Once | CUTANEOUS | Status: DC

## 2019-04-17 MED ORDER — OXYCODONE HCL 5 MG PO TABS
15.0000 mg | ORAL_TABLET | ORAL | Status: DC | PRN
  Administered 2019-04-17 – 2019-04-19 (×8): 15 mg via ORAL
  Filled 2019-04-17 (×8): qty 3

## 2019-04-17 MED ORDER — METHOCARBAMOL 750 MG PO TABS
750.0000 mg | ORAL_TABLET | Freq: Three times a day (TID) | ORAL | Status: DC
  Administered 2019-04-17 – 2019-05-09 (×65): 750 mg via ORAL
  Filled 2019-04-17 (×65): qty 1

## 2019-04-17 MED ORDER — BACITRACIN ZINC 500 UNIT/GM EX OINT
TOPICAL_OINTMENT | CUTANEOUS | Status: AC
  Filled 2019-04-17: qty 28.35

## 2019-04-17 MED ORDER — GABAPENTIN 100 MG PO CAPS
100.0000 mg | ORAL_CAPSULE | Freq: Three times a day (TID) | ORAL | Status: DC
  Administered 2019-04-17 – 2019-04-19 (×6): 100 mg via ORAL
  Filled 2019-04-17 (×6): qty 1

## 2019-04-17 MED ORDER — HYDROMORPHONE HCL 1 MG/ML IJ SOLN
0.2500 mg | INTRAMUSCULAR | Status: DC | PRN
  Administered 2019-04-17 (×4): 0.5 mg via INTRAVENOUS

## 2019-04-17 MED ORDER — MIDAZOLAM HCL 2 MG/2ML IJ SOLN
INTRAMUSCULAR | Status: AC
  Filled 2019-04-17: qty 2

## 2019-04-17 MED ORDER — ACETAMINOPHEN 325 MG PO TABS
650.0000 mg | ORAL_TABLET | Freq: Four times a day (QID) | ORAL | Status: DC
  Administered 2019-04-17 – 2019-05-09 (×85): 650 mg via ORAL
  Filled 2019-04-17 (×84): qty 2

## 2019-04-17 MED ORDER — FENTANYL CITRATE (PF) 250 MCG/5ML IJ SOLN
INTRAMUSCULAR | Status: DC | PRN
  Administered 2019-04-17: 50 ug via INTRAVENOUS
  Administered 2019-04-17: 100 ug via INTRAVENOUS
  Administered 2019-04-17 (×2): 50 ug via INTRAVENOUS

## 2019-04-17 MED ORDER — DEXMEDETOMIDINE HCL IN NACL 200 MCG/50ML IV SOLN
INTRAVENOUS | Status: AC
  Filled 2019-04-17: qty 50

## 2019-04-17 MED ORDER — ONDANSETRON HCL 4 MG/2ML IJ SOLN
INTRAMUSCULAR | Status: DC | PRN
  Administered 2019-04-17: 4 mg via INTRAVENOUS

## 2019-04-17 MED ORDER — OXYCODONE HCL 5 MG PO TABS
10.0000 mg | ORAL_TABLET | ORAL | Status: DC | PRN
  Administered 2019-04-17: 13:00:00 10 mg via ORAL

## 2019-04-17 MED ORDER — PROMETHAZINE HCL 25 MG/ML IJ SOLN: 6.2500 mg | INTRAMUSCULAR | Status: DC | PRN

## 2019-04-17 MED ORDER — ALPRAZOLAM 0.5 MG PO TABS: 1.0000 mg | ORAL_TABLET | Freq: Four times a day (QID) | ORAL | Status: DC

## 2019-04-17 MED ORDER — ROCURONIUM BROMIDE 50 MG/5ML IV SOSY
PREFILLED_SYRINGE | INTRAVENOUS | Status: DC | PRN
  Administered 2019-04-17: 50 mg via INTRAVENOUS

## 2019-04-17 MED ORDER — LIDOCAINE 2% (20 MG/ML) 5 ML SYRINGE
INTRAMUSCULAR | Status: DC | PRN
  Administered 2019-04-17: 60 mg via INTRAVENOUS

## 2019-04-17 MED ORDER — MORPHINE SULFATE (PF) 4 MG/ML IV SOLN
4.0000 mg | INTRAVENOUS | Status: DC | PRN
  Administered 2019-04-17 – 2019-04-18 (×4): 4 mg via INTRAVENOUS
  Filled 2019-04-17 (×4): qty 1

## 2019-04-17 MED ORDER — VANCOMYCIN HCL 1000 MG IV SOLR
INTRAVENOUS | Status: DC | PRN
  Administered 2019-04-17: 1000 mg via TOPICAL

## 2019-04-17 MED ORDER — DEXAMETHASONE SODIUM PHOSPHATE 10 MG/ML IJ SOLN
INTRAMUSCULAR | Status: DC | PRN
  Administered 2019-04-17: 4 mg via INTRAVENOUS

## 2019-04-17 MED ORDER — HYDROMORPHONE HCL 1 MG/ML IJ SOLN
INTRAMUSCULAR | Status: DC | PRN
  Administered 2019-04-17: 0.5 mg via INTRAVENOUS

## 2019-04-17 MED ORDER — TOBRAMYCIN SULFATE 1.2 G IJ SOLR
INTRAMUSCULAR | Status: DC | PRN
  Administered 2019-04-17: 1.2 g via TOPICAL

## 2019-04-17 MED ORDER — HYDROMORPHONE HCL 1 MG/ML IJ SOLN: 1.0000 mg | INTRAMUSCULAR | Status: DC | PRN

## 2019-04-17 MED ORDER — ONDANSETRON HCL 4 MG/2ML IJ SOLN
INTRAMUSCULAR | Status: AC
  Filled 2019-04-17: qty 2

## 2019-04-17 MED ORDER — DEXAMETHASONE SODIUM PHOSPHATE 10 MG/ML IJ SOLN
INTRAMUSCULAR | Status: AC
  Filled 2019-04-17: qty 1

## 2019-04-17 MED ORDER — VANCOMYCIN HCL 1000 MG IV SOLR
INTRAVENOUS | Status: AC
  Filled 2019-04-17: qty 1000

## 2019-04-17 MED ORDER — SODIUM CHLORIDE 0.9 % IV SOLN
2.0000 g | INTRAVENOUS | Status: DC
  Administered 2019-04-17: 17:00:00 2 g via INTRAVENOUS
  Filled 2019-04-17: qty 20

## 2019-04-17 MED ORDER — SUGAMMADEX SODIUM 200 MG/2ML IV SOLN
INTRAVENOUS | Status: DC | PRN
  Administered 2019-04-17: 195 mg via INTRAVENOUS

## 2019-04-17 SURGICAL SUPPLY — 59 items
BANDAGE ACE 4X5 VEL STRL LF (GAUZE/BANDAGES/DRESSINGS) ×2 IMPLANT
BANDAGE ACE 6X5 VEL STRL LF (GAUZE/BANDAGES/DRESSINGS) ×2 IMPLANT
BANDAGE ESMARK 6X9 LF (GAUZE/BANDAGES/DRESSINGS) ×1 IMPLANT
BNDG CMPR 9X6 STRL LF SNTH (GAUZE/BANDAGES/DRESSINGS) ×1
BNDG COHESIVE 6X5 TAN STRL LF (GAUZE/BANDAGES/DRESSINGS) ×2 IMPLANT
BNDG ESMARK 6X9 LF (GAUZE/BANDAGES/DRESSINGS) ×2
BNDG GAUZE ELAST 4 BULKY (GAUZE/BANDAGES/DRESSINGS) ×4 IMPLANT
BRUSH SCRUB SURG 4.25 DISP (MISCELLANEOUS) ×4 IMPLANT
CHLORAPREP W/TINT 26ML (MISCELLANEOUS) ×2 IMPLANT
COVER SURGICAL LIGHT HANDLE (MISCELLANEOUS) ×4 IMPLANT
COVER WAND RF STERILE (DRAPES) ×2 IMPLANT
CUFF TOURNIQUET SINGLE 18IN (TOURNIQUET CUFF) IMPLANT
CUFF TOURNIQUET SINGLE 24IN (TOURNIQUET CUFF) IMPLANT
CUFF TOURNIQUET SINGLE 34IN LL (TOURNIQUET CUFF) IMPLANT
DRAPE C-ARM 42X72 X-RAY (DRAPES) IMPLANT
DRAPE C-ARMOR (DRAPES) ×2 IMPLANT
DRAPE U-SHAPE 47X51 STRL (DRAPES) ×2 IMPLANT
DRSG ADAPTIC 3X8 NADH LF (GAUZE/BANDAGES/DRESSINGS) ×2 IMPLANT
ELECT REM PT RETURN 9FT ADLT (ELECTROSURGICAL) ×2
ELECTRODE REM PT RTRN 9FT ADLT (ELECTROSURGICAL) ×1 IMPLANT
EVACUATOR 1/8 PVC DRAIN (DRAIN) ×1 IMPLANT
GAUZE SPONGE 4X4 12PLY STRL (GAUZE/BANDAGES/DRESSINGS) ×2 IMPLANT
GLOVE BIO SURGEON STRL SZ 6.5 (GLOVE) ×6 IMPLANT
GLOVE BIO SURGEON STRL SZ7.5 (GLOVE) ×8 IMPLANT
GLOVE BIOGEL PI IND STRL 6.5 (GLOVE) ×1 IMPLANT
GLOVE BIOGEL PI IND STRL 7.5 (GLOVE) ×1 IMPLANT
GLOVE BIOGEL PI INDICATOR 6.5 (GLOVE) ×1
GLOVE BIOGEL PI INDICATOR 7.5 (GLOVE) ×1
GOWN STRL REUS W/ TWL LRG LVL3 (GOWN DISPOSABLE) ×2 IMPLANT
GOWN STRL REUS W/TWL LRG LVL3 (GOWN DISPOSABLE) ×4
KIT BASIN OR (CUSTOM PROCEDURE TRAY) ×2 IMPLANT
KIT TURNOVER KIT B (KITS) ×2 IMPLANT
MANIFOLD NEPTUNE II (INSTRUMENTS) ×2 IMPLANT
NEEDLE 22X1 1/2 (OR ONLY) (NEEDLE) IMPLANT
NS IRRIG 1000ML POUR BTL (IV SOLUTION) ×2 IMPLANT
PACK ORTHO EXTREMITY (CUSTOM PROCEDURE TRAY) ×2 IMPLANT
PAD ARMBOARD 7.5X6 YLW CONV (MISCELLANEOUS) ×4 IMPLANT
PADDING CAST COTTON 6X4 STRL (CAST SUPPLIES) ×6 IMPLANT
SPONGE LAP 18X18 RF (DISPOSABLE) ×2 IMPLANT
STAPLER VISISTAT 35W (STAPLE) IMPLANT
STOCKINETTE IMPERVIOUS LG (DRAPES) ×2 IMPLANT
STRIP CLOSURE SKIN 1/2X4 (GAUZE/BANDAGES/DRESSINGS) IMPLANT
SUCTION FRAZIER HANDLE 10FR (MISCELLANEOUS)
SUCTION TUBE FRAZIER 10FR DISP (MISCELLANEOUS) IMPLANT
SUT ETHILON 3 0 PS 1 (SUTURE) IMPLANT
SUT MNCRL AB 3-0 PS2 18 (SUTURE) ×2 IMPLANT
SUT MON AB 2-0 CT1 36 (SUTURE) ×2 IMPLANT
SUT PDS AB 2-0 CT1 27 (SUTURE) IMPLANT
SUT VIC AB 0 CT1 27 (SUTURE)
SUT VIC AB 0 CT1 27XBRD ANBCTR (SUTURE) IMPLANT
SUT VIC AB 2-0 CT1 27 (SUTURE)
SUT VIC AB 2-0 CT1 TAPERPNT 27 (SUTURE) IMPLANT
SYR CONTROL 10ML LL (SYRINGE) IMPLANT
TOWEL OR 17X24 6PK STRL BLUE (TOWEL DISPOSABLE) ×4 IMPLANT
TOWEL OR 17X26 10 PK STRL BLUE (TOWEL DISPOSABLE) ×4 IMPLANT
TUBE CONNECTING 12X1/4 (SUCTIONS) ×2 IMPLANT
UNDERPAD 30X30 (UNDERPADS AND DIAPERS) ×2 IMPLANT
WATER STERILE IRR 1000ML POUR (IV SOLUTION) ×4 IMPLANT
YANKAUER SUCT BULB TIP NO VENT (SUCTIONS) ×2 IMPLANT
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
## 2019-04-17 NOTE — Progress Notes (Signed)
Pharmacy Antibiotic Note  Evan [name] is a 48 y.o. male admitted on 04/17/2019 with right knee septic arthritis s/p hardware removal and I&D on 4/24.  Pharmacy has been consulted for vancomycin dosing. He was given vancomycin 1000 mg IV pre-op at 10:36. Renal function is normal.  Vancomycin 2000 mg IV q12h. Goal AUC 400-550. Expected AUC: 503 SCr used: 0.85   Plan: Vancomycin 2000 mg IV q12h Monitor renal function, clinical progress, and C/S F/U LOT  Height: 6\' 3"  (190.5 cm) Weight: 215 lb (97.5 kg) IBW/kg (Calculated) : 84.5  Temp (24hrs), Avg:97.9 F (36.6 C), Min:97.5 F (36.4 C), Max:98.4 F (36.9 C)  Recent Labs  Lab 04/17/19 0933  WBC 6.2  CREATININE 0.85    Estimated Creatinine Clearance: 131.2 mL/min (by C-G formula based on SCr of 0.85 mg/dL).    Allergies  Allergen Reactions  . Contrast Media [Iodinated Diagnostic Agents] Shortness Of Breath and Swelling    Shortness of breath., throat swelling  . Other Swelling    Fish, not shellfish SWELLING REACTION UNSPECIFIED     Antimicrobials this admission: vancomycin 4/24 >>  ceftriaxone 4/24 >>   Dose adjustments this admission:   Microbiology results: 4/24 right knee medial: 4/24 right synovial tissue: 4/24 right knee:  Thank you for allowing pharmacy to be a part of this patient's care.  Renold Genta Danielle 04/17/2019 3:12 PM

## 2019-04-17 NOTE — Plan of Care (Signed)
  Problem: Coping: Goal: Level of anxiety will decrease Outcome: Progressing   Problem: Pain Managment: Goal: General experience of comfort will improve Outcome: Progressing   Problem: Safety: Goal: Ability to remain free from injury will improve Outcome: Progressing   

## 2019-04-17 NOTE — Anesthesia Preprocedure Evaluation (Signed)
Anesthesia Evaluation  Patient identified by MRN, date of birth, ID band Patient awake    Reviewed: Allergy & Precautions, NPO status , Patient's Chart, lab work & pertinent test results  Airway Mallampati: I  TM Distance: >3 FB Neck ROM: Full    Dental no notable dental hx. (+) Teeth Intact, Dental Advisory Given   Pulmonary neg pulmonary ROS, former smoker,    Pulmonary exam normal breath sounds clear to auscultation       Cardiovascular hypertension, Pt. on medications negative cardio ROS Normal cardiovascular exam Rhythm:Regular Rate:Normal     Neuro/Psych PSYCHIATRIC DISORDERS Anxiety Depression negative neurological ROS     GI/Hepatic negative GI ROS, (+) Hepatitis -, C  Endo/Other  negative endocrine ROS  Renal/GU negative Renal ROS  negative genitourinary   Musculoskeletal negative musculoskeletal ROS (+)   Abdominal   Peds  (+) ADHD Hematology  (+) Blood dyscrasia, anemia ,   Anesthesia Other Findings Right tibial plateau fracture  Reproductive/Obstetrics                             Lab Results  Component Value Date   WBC 6.2 04/17/2019   HGB 9.0 (L) 04/17/2019   HCT 31.1 (L) 04/17/2019   MCV 85.0 04/17/2019   PLT 363 04/17/2019   Lab Results  Component Value Date   CREATININE 1.11 01/19/2019   BUN 21 (H) 01/19/2019   NA 134 (L) 01/19/2019   K 4.6 01/19/2019   CL 101 01/19/2019   CO2 26 01/19/2019    Anesthesia Physical  Anesthesia Plan  ASA: II  Anesthesia Plan: General   Post-op Pain Management:    Induction: Intravenous  PONV Risk Score and Plan: 2 and Midazolam, Dexamethasone and Ondansetron  Airway Management Planned: Oral ETT  Additional Equipment:   Intra-op Plan:   Post-operative Plan: Extubation in OR  Informed Consent: I have reviewed the patients History and Physical, chart, labs and discussed the procedure including the risks, benefits  and alternatives for the proposed anesthesia with the patient or authorized representative who has indicated his/her understanding and acceptance.     Dental advisory given  Plan Discussed with: CRNA  Anesthesia Plan Comments:         Anesthesia Quick Evaluation

## 2019-04-17 NOTE — Anesthesia Procedure Notes (Signed)
Procedure Name: Intubation Date/Time: 04/17/2019 9:58 AM Performed by: Renato Shin, CRNA Pre-anesthesia Checklist: Patient identified, Emergency Drugs available, Suction available and Patient being monitored Patient Re-evaluated:Patient Re-evaluated prior to induction Oxygen Delivery Method: Circle system utilized Preoxygenation: Pre-oxygenation with 100% oxygen Induction Type: IV induction and Rapid sequence Laryngoscope Size: Miller and 2 Grade View: Grade I Tube type: Oral Tube size: 7.5 mm Number of attempts: 1 Airway Equipment and Method: Stylet and Oral airway Placement Confirmation: ETT inserted through vocal cords under direct vision,  positive ETCO2 and breath sounds checked- equal and bilateral Secured at: 21 cm Tube secured with: Tape Dental Injury: Teeth and Oropharynx as per pre-operative assessment

## 2019-04-17 NOTE — Anesthesia Postprocedure Evaluation (Signed)
Anesthesia Post Note  Patient: Evan [name]  Procedure(s) Performed: IRRIGATION AND DEBRIDEMENT RIGHT KNEE (Right ) HARDWARE REMOVAL RIGHT TIBIA (Right )     Patient location during evaluation: PACU Anesthesia Type: General Level of consciousness: awake and alert Pain management: pain level controlled Vital Signs Assessment: post-procedure vital signs reviewed and stable Respiratory status: spontaneous breathing, nonlabored ventilation, respiratory function stable and patient connected to nasal cannula oxygen Cardiovascular status: blood pressure returned to baseline and stable Postop Assessment: no apparent nausea or vomiting Anesthetic complications: no    Last Vitals:  Vitals:   04/17/19 1305 04/17/19 1440  BP: (!) 138/93 (!) 150/93  Pulse: 72 82  Resp: 13 16  Temp:  36.6 C  SpO2: 93% 98%    Last Pain:  Vitals:   04/17/19 1440  TempSrc: Oral  PainSc:                  Tiajuana Amass

## 2019-04-17 NOTE — Interval H&P Note (Signed)
History and Physical Interval Note:  04/17/2019 9:12 AM  Evan [name]  has presented today for surgery, with the diagnosis of SEPTIC ARTHRITIS OF RIGHT KNEE.  The various methods of treatment have been discussed with the patient and family. After consideration of risks, benefits and other options for treatment, the patient has consented to  Procedure(s): IRRIGATION AND DEBRIDEMENT RIGHT KNEE (Right) POSSIBLE HARDWARE REMOVAL RIGHT TIBIA (Right) as a surgical intervention.  The patient's history has been reviewed, patient examined, no change in status, stable for surgery.  I have reviewed the patient's chart and labs.  Questions were answered to the patient's satisfaction.     Lennette Bihari P Kelii Chittum

## 2019-04-17 NOTE — Transfer of Care (Signed)
Immediate Anesthesia Transfer of Care Note  Patient: Evan [name]  Procedure(s) Performed: IRRIGATION AND DEBRIDEMENT RIGHT KNEE (Right ) HARDWARE REMOVAL RIGHT TIBIA (Right )  Patient Location: PACU  Anesthesia Type:General  Level of Consciousness: drowsy and patient cooperative  Airway & Oxygen Therapy: Patient Spontanous Breathing and Patient connected to face mask oxygen  Post-op Assessment: Report given to RN and Post -op Vital signs reviewed and stable  Post vital signs: Reviewed and stable  Last Vitals:  Vitals Value Taken Time  BP 160/99 04/17/2019 12:06 PM  Temp    Pulse 69 04/17/2019 12:16 PM  Resp 12 04/17/2019 12:16 PM  SpO2 94 % 04/17/2019 12:16 PM  Vitals shown include unvalidated device data.  Last Pain:  Vitals:   04/17/19 0802  TempSrc: Oral  PainSc: 5       Patients Stated Pain Goal: 0 (32/54/98 2641)  Complications: No apparent anesthesia complications

## 2019-04-18 ENCOUNTER — Encounter (HOSPITAL_COMMUNITY): Payer: Self-pay | Admitting: Student

## 2019-04-18 ENCOUNTER — Inpatient Hospital Stay: Payer: Self-pay

## 2019-04-18 DIAGNOSIS — S82141A Displaced bicondylar fracture of right tibia, initial encounter for closed fracture: Secondary | ICD-10-CM

## 2019-04-18 DIAGNOSIS — M009 Pyogenic arthritis, unspecified: Secondary | ICD-10-CM

## 2019-04-18 DIAGNOSIS — Z96651 Presence of right artificial knee joint: Secondary | ICD-10-CM

## 2019-04-18 DIAGNOSIS — T8453XA Infection and inflammatory reaction due to internal right knee prosthesis, initial encounter: Secondary | ICD-10-CM

## 2019-04-18 LAB — CBC
HCT: 27.6 % — ABNORMAL LOW (ref 39.0–52.0)
Hemoglobin: 8.4 g/dL — ABNORMAL LOW (ref 13.0–17.0)
MCH: 24.5 pg — ABNORMAL LOW (ref 26.0–34.0)
MCHC: 30.4 g/dL (ref 30.0–36.0)
MCV: 80.5 fL (ref 80.0–100.0)
Platelets: 376 10*3/uL (ref 150–400)
RBC: 3.43 MIL/uL — ABNORMAL LOW (ref 4.22–5.81)
RDW: 14.2 % (ref 11.5–15.5)
WBC: 6.3 10*3/uL (ref 4.0–10.5)
nRBC: 0 % (ref 0.0–0.2)

## 2019-04-18 LAB — VITAMIN D 25 HYDROXY (VIT D DEFICIENCY, FRACTURES): Vit D, 25-Hydroxy: 32 ng/mL (ref 30.0–100.0)

## 2019-04-18 MED ORDER — MORPHINE SULFATE (PF) 4 MG/ML IV SOLN
4.0000 mg | INTRAVENOUS | Status: DC | PRN
  Administered 2019-04-18 (×2): 4 mg via INTRAVENOUS
  Filled 2019-04-18 (×2): qty 1

## 2019-04-18 MED ORDER — SODIUM CHLORIDE 0.9% FLUSH
10.0000 mL | INTRAVENOUS | Status: DC | PRN
  Administered 2019-04-20 – 2019-05-07 (×2): 10 mL
  Filled 2019-04-18 (×2): qty 40

## 2019-04-18 MED ORDER — VITAMIN D 25 MCG (1000 UNIT) PO TABS
2000.0000 [IU] | ORAL_TABLET | Freq: Every day | ORAL | Status: DC
  Administered 2019-04-18 – 2019-05-05 (×18): 2000 [IU] via ORAL
  Filled 2019-04-18 (×18): qty 2

## 2019-04-18 MED ORDER — HYDROMORPHONE HCL 1 MG/ML IJ SOLN
2.0000 mg | INTRAMUSCULAR | Status: DC | PRN
  Administered 2019-04-18 – 2019-04-19 (×5): 2 mg via INTRAVENOUS
  Filled 2019-04-18 (×5): qty 2

## 2019-04-18 NOTE — Progress Notes (Signed)
Subjective: 1 Day Post-Op Procedure(s) (LRB): IRRIGATION AND DEBRIDEMENT RIGHT KNEE (Right) HARDWARE REMOVAL RIGHT TIBIA (Right) Patient reports pain as moderate and severe.    Objective: Vital signs in last 24 hours: Temp:  [97.4 F (36.3 C)-98.2 F (36.8 C)] 97.7 F (36.5 C) (04/25 0624) Pulse Rate:  [63-82] 75 (04/25 0624) Resp:  [12-20] 18 (04/25 0624) BP: (116-171)/(72-99) 120/79 (04/25 0624) SpO2:  [92 %-100 %] 96 % (04/25 0624)  Intake/Output from previous day: 04/24 0701 - 04/25 0700 In: 1040 [P.O.:240; I.V.:800] Out: 5625 [Urine:3550; Drains:25; Blood:250] Intake/Output this shift: No intake/output data recorded.  Recent Labs    04/17/19 0933 04/18/19 0634  HGB 9.0* 8.4*   Recent Labs    04/17/19 0933 04/18/19 0634  WBC 6.2 6.3  RBC 3.66* 3.43*  HCT 31.1* 27.6*  PLT 363 376   Recent Labs    04/17/19 0933  NA 136  K 4.6  CL 102  CO2 23  BUN 18  CREATININE 0.85  GLUCOSE 90  CALCIUM 8.8*   Recent Labs    04/17/19 0740  INR 1.0    Neurovascular intact Sensation intact distally Intact pulses distally Dorsiflexion/Plantar flexion intact Incision: dressing C/D/I   Assessment/Plan: 1 Day Post-Op Procedure(s) (LRB): IRRIGATION AND DEBRIDEMENT RIGHT KNEE (Right) HARDWARE REMOVAL RIGHT TIBIA (Right) Up with therapy  TDWB RLE dsg change Mon Cont abx  Cont pain control as is       Avery Dennison 04/18/2019, 9:07 AM

## 2019-04-18 NOTE — Plan of Care (Signed)

## 2019-04-18 NOTE — Progress Notes (Signed)
PT Cancellation Note  Patient Details Name: Evan [name] MRN: [id number] DOB: 1971-10-04   Cancelled Treatment:    Reason Eval/Treat Not Completed: Patient declined, no reason specified. Pt refusing OOB mobility at this time, reporting that he wanted to take his pain meds and take a nap. PT highly encouraged pt to transfer OOB with nursing staff later today. PT will continue to follow acutely as available.    [place] 04/18/2019, 10:19 AM

## 2019-04-18 NOTE — Progress Notes (Signed)
INFECTIOUS DISEASE PROGRESS NOTE  ID: Evan [name] is a 48 y.o. male with  Principal Problem:   Septic arthritis of knee, right ([place]) Active Problems:   Hardware complicating wound infection ([place])   Hepatitis C virus infection cured after antiviral drug therapy   Fracture of left tibial plateau  Subjective: Resting quietly  Abtx:  Anti-infectives (From admission, onward)   Start     Dose/Rate Route Frequency Ordered Stop   04/18/19 1700  cefTRIAXone (ROCEPHIN) 2 g in sodium chloride 0.9 % 100 mL IVPB     2 g 200 mL/hr over 30 Minutes Intravenous Every 24 hours 04/17/19 1709     04/17/19 2200  rifampin (RIFADIN) capsule 300 mg     300 mg Oral Every 12 hours 04/17/19 1727     04/17/19 1800  vancomycin (VANCOCIN) 2,000 mg in sodium chloride 0.9 % 500 mL IVPB     2,000 mg 250 mL/hr over 120 Minutes Intravenous Every 12 hours 04/17/19 1523     04/17/19 1730  cefTRIAXone (ROCEPHIN) 2 g in sodium chloride 0.9 % 100 mL IVPB  Status:  Discontinued     2 g 200 mL/hr over 30 Minutes Intravenous Every 24 hours 04/17/19 1648 04/17/19 1709   04/17/19 1600  cefTRIAXone (ROCEPHIN) 2 g in sodium chloride 0.9 % 100 mL IVPB  Status:  Discontinued     2 g 200 mL/hr over 30 Minutes Intravenous Every 24 hours 04/17/19 1444 04/17/19 1648   04/17/19 1038  tobramycin (NEBCIN) powder  Status:  Discontinued       As needed 04/17/19 1038 04/17/19 1131   04/17/19 1038  vancomycin (VANCOCIN) powder  Status:  Discontinued       As needed 04/17/19 1038 04/17/19 1131      Medications:  Scheduled: . acetaminophen  650 mg Oral Q6H  . ALPRAZolam  1 mg Oral TID  . aspirin  325 mg Oral Daily  . cholecalciferol  2,000 Units Oral Q lunch  . gabapentin  100 mg Oral TID  . methocarbamol  750 mg Oral Q8H  . rifampin  300 mg Oral Q12H    Objective: Vital signs in last 24 hours: Temp:  [97.4 F (36.3 C)-98.2 F (36.8 C)] 97.5 F (36.4 C) (04/25 1051) Pulse Rate:  [75-85] 85 (04/25 1051) Resp:   [16-18] 18 (04/25 1051) BP: (120-171)/(77-97) 130/77 (04/25 1051) SpO2:  [96 %-98 %] 96 % (04/25 1051)   General appearance: no distress  Lab Results Recent Labs    04/17/19 0933 04/18/19 0634  WBC 6.2 6.3  HGB 9.0* 8.4*  HCT 31.1* 27.6*  NA 136  --   K 4.6  --   CL 102  --   CO2 23  --   BUN 18  --   CREATININE 0.85  --    Liver Panel Recent Labs    04/17/19 0933  PROT 6.2*  ALBUMIN 2.4*  AST 13*  ALT 9  ALKPHOS 72  BILITOT 0.3   Sedimentation Rate Recent Labs    04/17/19 0933  ESRSEDRATE 75*   C-Reactive Protein Recent Labs    04/17/19 0740  CRP 15.4*    Microbiology: Recent Results (from the past 240 hour(s))  Aerobic/Anaerobic Culture (surgical/deep wound)     Status: None (Preliminary result)   Collection Time: 04/17/19 10:27 AM  Result Value Ref Range Status   Specimen Description TISSUE SYNOVIAL RIGHT KNEE  Final   Special Requests SPEC A  Final   Gram Stain  Final    MODERATE WBC PRESENT,BOTH PMN AND MONONUCLEAR NO ORGANISMS SEEN    Culture   Final    NO GROWTH 1 DAY Performed at [facility], [place] [address], [place], [place] [address]    Report Status PENDING  Incomplete  Aerobic/Anaerobic Culture (surgical/deep wound)     Status: None (Preliminary result)   Collection Time: 04/17/19 10:28 AM  Result Value Ref Range Status   Specimen Description TISSUE RIGHT KNEE  Final   Special Requests SPEC B  Final   Gram Stain   Final    FEW WBC PRESENT, PREDOMINANTLY PMN NO ORGANISMS SEEN    Culture   Final    NO GROWTH 1 DAY Performed at [facility], [place] [address] [place], [place] [address]    Report Status PENDING  Incomplete  Aerobic/Anaerobic Culture (surgical/deep wound)     Status: None (Preliminary result)   Collection Time: 04/17/19 10:31 AM  Result Value Ref Range Status   Specimen Description TISSUE RIGHT WOUND MEDIAL  Final   Special Requests SPEC C  Final   Gram Stain   Final    RARE WBC PRESENT,BOTH PMN AND  MONONUCLEAR NO ORGANISMS SEEN    Culture   Final    NO GROWTH 1 DAY Performed at [facility], [place] [address] [place], [place] [address]    Report Status PENDING  Incomplete    Studies/Results: Ct Knee Right Wo Contrast  Result Date: 04/17/2019 CLINICAL DATA:  Right knee fracture status post ORIF EXAM: CT OF THE RIGHT KNEE WITHOUT CONTRAST TECHNIQUE: Multidetector CT imaging of the RIGHT knee was performed according to the standard protocol. Multiplanar CT image reconstructions were also generated. COMPARISON:  None. FINDINGS: Bones/Joint/Cartilage Severely comminuted proximal medial tibial metaphyseal fracture with vertical component extending to the medial and lateral tibial plateaus. Comminution of the tibial spine. Interval open reduction internal fixation with medial and lateral metallic side plates and multiple interlocking screws with near anatomic alignment. Comminuted fracture of the proximal fibular metaphysis. Generalized osteopenia. Small joint effusion likely reflecting a hemarthrosis given the density. Surgical drain within the patellofemoral joint space. No aggressive osseous lesion. Ligaments Ligaments are suboptimally evaluated by CT. Muscles and Tendons Muscles are normal. No muscle atrophy. No intramuscular fluid collection or hematoma. Soft tissue No fluid collection or hematoma. No soft tissue mass. Generalized soft tissue edema around the knee and proximal lower leg. IMPRESSION: 1. Interval ORIF of a severely comminuted proximal tibial metaphysis fracture extending in involving bilateral tibial plateaus and tibial spine. Near anatomic alignment following the ORIF. Small joint effusion likely reflecting a hemarthrosis given the density. Surgical drain within the patellofemoral joint space. Electronically Signed   By: Kathreen Devoid   On: 04/17/2019 18:40   Dg Knee Right Port  Result Date: 04/17/2019 CLINICAL DATA:  Status post tibial plateau repair EXAM: PORTABLE RIGHT KNEE  - 1-2 VIEW COMPARISON:  Intraoperative films from earlier in the same day. FINDINGS: Fixation side plates are noted along the proximal tibia both medially and laterally with multiple fixation screws. Wound drain and wound VAC are noted. Previously seen proximal tibial and proximal fibular fractures are again noted. IMPRESSION: Status post ORIF of proximal right tibial fracture. Electronically Signed   By: Inez Catalina M.D.   On: 04/17/2019 14:50   Dg C-arm 1-60 Min  Result Date: 04/17/2019 CLINICAL DATA:  Irrigation and debridement of right knee. Hardware removal from right tibia. EXAM: RIGHT KNEE - 3 VIEW; DG  C-ARM 61-120 MIN Radiation exposure index: 1.99 mGy. COMPARISON:  Radiographs of January 20, 2019. FINDINGS: Four intraoperative fluoroscopic images of the right knee demonstrate the patient be status post surgical internal fixation of comminuted tibial plateau fracture. IMPRESSION: Fluoroscopic guidance provided during right knee surgery. Electronically Signed   By: Marijo Conception M.D.   On: 04/17/2019 11:38   Dg Knee 2 Views Right  Result Date: 04/17/2019 CLINICAL DATA:  Irrigation and debridement of right knee. Hardware removal from right tibia. EXAM: RIGHT KNEE - 3 VIEW; DG C-ARM 61-120 MIN Radiation exposure index: 1.99 mGy. COMPARISON:  Radiographs of January 20, 2019. FINDINGS: Four intraoperative fluoroscopic images of the right knee demonstrate the patient be status post surgical internal fixation of comminuted tibial plateau fracture. IMPRESSION: Fluoroscopic guidance provided during right knee surgery. Electronically Signed   By: Marijo Conception M.D.   On: 04/17/2019 11:38     Assessment/Plan: R septic knee with hardware  Total days of antibiotics: 2 rifampin/vanco/ceftriaxone  Will check his Cx tomorrow, they are ngtd today Available as needed on 4-26 He will need a PIC (if he does not yet have)         Bobby Rumpf MD, FACP Infectious Diseases (pager) [phone]  [email or web address] 04/18/2019, 1:57 PM  LOS: 1 day

## 2019-04-18 NOTE — Progress Notes (Signed)
Peripherally Inserted Central Catheter/Midline Placement  The IV Nurse has discussed with the patient and/or persons authorized to consent for the patient, the purpose of this procedure and the potential benefits and risks involved with this procedure.  The benefits include less needle sticks, lab draws from the catheter, and the patient may be discharged home with the catheter. Risks include, but not limited to, infection, bleeding, blood clot (thrombus formation), and puncture of an artery; nerve damage and irregular heartbeat and possibility to perform a PICC exchange if needed/ordered by physician.  Alternatives to this procedure were also discussed.  B[employer] Power PICC patient education guide, fact sheet on infection prevention and patient information card has been provided to patient /or left at bedside.    PICC/Midline Placement Documentation  PICC Single Lumen 34/03/70 PICC Left Basilic 45 cm 0 cm (Active)  Indication for Insertion or Continuance of Line Prolonged intravenous therapies 04/18/2019  4:00 PM  Exposed Catheter (cm) 0 cm 04/18/2019  4:00 PM  Site Assessment Clean;Dry;Intact 04/18/2019  4:00 PM  Line Status Flushed;Saline locked;Blood return noted 04/18/2019  4:00 PM  Dressing Type Transparent 04/18/2019  4:00 PM  Dressing Status Clean;Dry;Intact;Antimicrobial disc in place 04/18/2019  4:00 PM  Line Care Connections checked and tightened 04/18/2019  4:00 PM  Line Adjustment (NICU/IV Team Only) No 04/18/2019  4:00 PM  Dressing Intervention New dressing 04/18/2019  4:00 PM  Dressing Change Due 04/25/19 04/18/2019  4:00 PM       Rolena Infante 04/18/2019, 4:46 PM

## 2019-04-18 NOTE — Progress Notes (Addendum)
At bedside to place PICC.  Pt stated his left arm was restricted due to hx of melanoma and lymph nodes removed and radiation.  He picked up his left arm, pointing to it with his right hand.  Pink restricted extremity noted on left wrist.  Tourniquet applied to right arm.  RUE noted to have swelling and slight redness.  When began exploring , pt stated that the right arm was restricted for the same reason.  No axillary hair and small scar noted to armpit.  Reviewed chart for specific detail of which arm, with RA confirmation found.  PICC placed in LUE.  When assessing LUE, noted scarring on posterior aspect of LFA.  Appears to be IV track scarring in separate tracks and noted at [facility] area.  Pt denies IVDA when asked, states has had a lot of blood work done and IV's for medications.  Scarring is not in usual location for ideal IV placement and was approximately 4 inches and 6 inches with the LAC approximately 1 inch. RHand PIV removed and pink restriction bracelet applied to BUE.  [place] notified. Chriss Czar PA also notified.  Message also sent to Dr Johnnye Sima. 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

## 2019-04-18 NOTE — Progress Notes (Signed)
Clarification from Dr  Johnnye Sima, okay to place PICC today.

## 2019-04-19 DIAGNOSIS — Z95828 Presence of other vascular implants and grafts: Secondary | ICD-10-CM

## 2019-04-19 DIAGNOSIS — Z85820 Personal history of malignant melanoma of skin: Secondary | ICD-10-CM

## 2019-04-19 LAB — VANCOMYCIN, TROUGH: Vancomycin Tr: 19 ug/mL (ref 15–20)

## 2019-04-19 LAB — VANCOMYCIN, PEAK: Vancomycin Pk: 45 ug/mL — ABNORMAL HIGH (ref 30–40)

## 2019-04-19 MED ORDER — HYDROMORPHONE HCL 1 MG/ML IJ SOLN
2.0000 mg | INTRAMUSCULAR | Status: DC | PRN
  Administered 2019-04-19: 2 mg via INTRAVENOUS
  Filled 2019-04-19 (×2): qty 2

## 2019-04-19 MED ORDER — OXYCODONE HCL 5 MG PO TABS
15.0000 mg | ORAL_TABLET | ORAL | Status: DC | PRN
  Administered 2019-04-19: 15 mg via ORAL
  Filled 2019-04-19: qty 3

## 2019-04-19 MED ORDER — HYDROMORPHONE HCL 1 MG/ML IJ SOLN
3.0000 mg | INTRAMUSCULAR | Status: DC | PRN
  Administered 2019-04-19 – 2019-04-21 (×14): 3 mg via INTRAVENOUS
  Filled 2019-04-19 (×14): qty 3

## 2019-04-19 MED ORDER — VANCOMYCIN HCL 10 G IV SOLR
1250.0000 mg | Freq: Two times a day (BID) | INTRAVENOUS | Status: DC
  Administered 2019-04-19 – 2019-04-26 (×14): 1250 mg via INTRAVENOUS
  Filled 2019-04-19 (×17): qty 1250

## 2019-04-19 MED ORDER — OXYCODONE HCL 5 MG PO TABS
20.0000 mg | ORAL_TABLET | ORAL | Status: DC | PRN
  Administered 2019-04-19 – 2019-04-29 (×55): 20 mg via ORAL
  Filled 2019-04-19 (×58): qty 4

## 2019-04-19 MED ORDER — GABAPENTIN 300 MG PO CAPS
300.0000 mg | ORAL_CAPSULE | Freq: Three times a day (TID) | ORAL | Status: DC
  Administered 2019-04-19 – 2019-05-09 (×60): 300 mg via ORAL
  Filled 2019-04-19 (×22): qty 1
  Filled 2019-04-19: qty 3
  Filled 2019-04-19 (×38): qty 1

## 2019-04-19 NOTE — Progress Notes (Signed)
INFECTIOUS DISEASE PROGRESS NOTE  ID: Evan [name] is a 48 y.o. male with  Principal Problem:   Septic arthritis of knee, right ([place]) Active Problems:   Hardware complicating wound infection ([place])   Hepatitis C virus infection cured after antiviral drug therapy   Fracture of left tibial plateau  Subjective: C/o leg pain.   Abtx:  Anti-infectives (From admission, onward)   Start     Dose/Rate Route Frequency Ordered Stop   04/18/19 1700  cefTRIAXone (ROCEPHIN) 2 g in sodium chloride 0.9 % 100 mL IVPB     2 g 200 mL/hr over 30 Minutes Intravenous Every 24 hours 04/17/19 1709     04/17/19 2200  rifampin (RIFADIN) capsule 300 mg     300 mg Oral Every 12 hours 04/17/19 1727     04/17/19 1800  vancomycin (VANCOCIN) 2,000 mg in sodium chloride 0.9 % 500 mL IVPB     2,000 mg 250 mL/hr over 120 Minutes Intravenous Every 12 hours 04/17/19 1523     04/17/19 1730  cefTRIAXone (ROCEPHIN) 2 g in sodium chloride 0.9 % 100 mL IVPB  Status:  Discontinued     2 g 200 mL/hr over 30 Minutes Intravenous Every 24 hours 04/17/19 1648 04/17/19 1709   04/17/19 1600  cefTRIAXone (ROCEPHIN) 2 g in sodium chloride 0.9 % 100 mL IVPB  Status:  Discontinued     2 g 200 mL/hr over 30 Minutes Intravenous Every 24 hours 04/17/19 1444 04/17/19 1648   04/17/19 1038  tobramycin (NEBCIN) powder  Status:  Discontinued       As needed 04/17/19 1038 04/17/19 1131   04/17/19 1038  vancomycin (VANCOCIN) powder  Status:  Discontinued       As needed 04/17/19 1038 04/17/19 1131      Medications:  Scheduled:  acetaminophen  650 mg Oral Q6H   ALPRAZolam  1 mg Oral TID   aspirin  325 mg Oral Daily   cholecalciferol  2,000 Units Oral Q lunch   gabapentin  100 mg Oral TID   methocarbamol  750 mg Oral Q8H   rifampin  300 mg Oral Q12H    Objective: Vital signs in last 24 hours: Temp:  [97.5 F (36.4 C)-98.6 F (37 C)] 98 F (36.7 C) (04/26 0544) Pulse Rate:  [78-85] 84 (04/26 0544) Resp:   [16-18] 16 (04/25 2116) BP: (130-184)/(77-105) 170/101 (04/26 0544) SpO2:  [96 %-99 %] 96 % (04/26 0544)   General appearance: alert, cooperative and no distress Resp: clear to auscultation bilaterally Cardio: regular rate and rhythm GI: normal findings: bowel sounds normal and soft, non-tender Extremities: RLE wrapped. LUE PIC is clean, few scars distally.  Neurologic: Sensory: normal, nl light touch R foot, grossly  Lab Results Recent Labs    04/17/19 0933 04/18/19 0634  WBC 6.2 6.3  HGB 9.0* 8.4*  HCT 31.1* 27.6*  NA 136  --   K 4.6  --   CL 102  --   CO2 23  --   BUN 18  --   CREATININE 0.85  --    Liver Panel Recent Labs    04/17/19 0933  PROT 6.2*  ALBUMIN 2.4*  AST 13*  ALT 9  ALKPHOS 72  BILITOT 0.3   Sedimentation Rate Recent Labs    04/17/19 0933  ESRSEDRATE 75*   C-Reactive Protein Recent Labs    04/17/19 0740  CRP 15.4*    Microbiology: Recent Results (from the past 240 hour(s))  Aerobic/Anaerobic Culture (surgical/deep  wound)     Status: None (Preliminary result)   Collection Time: 04/17/19 10:27 AM  Result Value Ref Range Status   Specimen Description TISSUE SYNOVIAL RIGHT KNEE  Final   Special Requests SPEC A  Final   Gram Stain   Final    MODERATE WBC PRESENT,BOTH PMN AND MONONUCLEAR NO ORGANISMS SEEN    Culture   Final    NO GROWTH 1 DAY Performed at [facility], [place] [address], [place], [place] [address]    Report Status PENDING  Incomplete  Aerobic/Anaerobic Culture (surgical/deep wound)     Status: None (Preliminary result)   Collection Time: 04/17/19 10:28 AM  Result Value Ref Range Status   Specimen Description TISSUE RIGHT KNEE  Final   Special Requests SPEC B  Final   Gram Stain   Final    FEW WBC PRESENT, PREDOMINANTLY PMN NO ORGANISMS SEEN    Culture   Final    NO GROWTH 1 DAY Performed at [facility], [place] [address], [place], [place] [address]    Report Status PENDING  Incomplete    Aerobic/Anaerobic Culture (surgical/deep wound)     Status: None (Preliminary result)   Collection Time: 04/17/19 10:31 AM  Result Value Ref Range Status   Specimen Description TISSUE RIGHT WOUND MEDIAL  Final   Special Requests SPEC C  Final   Gram Stain   Final    RARE WBC PRESENT,BOTH PMN AND MONONUCLEAR NO ORGANISMS SEEN    Culture   Final    NO GROWTH 1 DAY Performed at [facility], [place] [address], [place], [place] [address]    Report Status PENDING  Incomplete    Studies/Results: Ct Knee Right Wo Contrast  Result Date: 04/17/2019 CLINICAL DATA:  Right knee fracture status post ORIF EXAM: CT OF THE RIGHT KNEE WITHOUT CONTRAST TECHNIQUE: Multidetector CT imaging of the RIGHT knee was performed according to the standard protocol. Multiplanar CT image reconstructions were also generated. COMPARISON:  None. FINDINGS: Bones/Joint/Cartilage Severely comminuted proximal medial tibial metaphyseal fracture with vertical component extending to the medial and lateral tibial plateaus. Comminution of the tibial spine. Interval open reduction internal fixation with medial and lateral metallic side plates and multiple interlocking screws with near anatomic alignment. Comminuted fracture of the proximal fibular metaphysis. Generalized osteopenia. Small joint effusion likely reflecting a hemarthrosis given the density. Surgical drain within the patellofemoral joint space. No aggressive osseous lesion. Ligaments Ligaments are suboptimally evaluated by CT. Muscles and Tendons Muscles are normal. No muscle atrophy. No intramuscular fluid collection or hematoma. Soft tissue No fluid collection or hematoma. No soft tissue mass. Generalized soft tissue edema around the knee and proximal lower leg. IMPRESSION: 1. Interval ORIF of a severely comminuted proximal tibial metaphysis fracture extending in involving bilateral tibial plateaus and tibial spine. Near anatomic alignment following the ORIF. Small joint  effusion likely reflecting a hemarthrosis given the density. Surgical drain within the patellofemoral joint space. Electronically Signed   By: Kathreen Devoid   On: 04/17/2019 18:40   Dg Knee Right Port  Result Date: 04/17/2019 CLINICAL DATA:  Status post tibial plateau repair EXAM: PORTABLE RIGHT KNEE - 1-2 VIEW COMPARISON:  Intraoperative films from earlier in the same day. FINDINGS: Fixation side plates are noted along the proximal tibia both medially and laterally with multiple fixation screws. Wound drain and wound VAC are noted. Previously seen proximal tibial and proximal fibular fractures are again noted. IMPRESSION: Status post ORIF of proximal right  tibial fracture. Electronically Signed   By: Inez Catalina M.D.   On: 04/17/2019 14:50   Dg C-arm 1-60 Min  Result Date: 04/17/2019 CLINICAL DATA:  Irrigation and debridement of right knee. Hardware removal from right tibia. EXAM: RIGHT KNEE - 3 VIEW; DG C-ARM 61-120 MIN Radiation exposure index: 1.99 mGy. COMPARISON:  Radiographs of January 20, 2019. FINDINGS: Four intraoperative fluoroscopic images of the right knee demonstrate the patient be status post surgical internal fixation of comminuted tibial plateau fracture. IMPRESSION: Fluoroscopic guidance provided during right knee surgery. Electronically Signed   By: Marijo Conception M.D.   On: 04/17/2019 11:38   [place] Ekg Site Rite  Result Date: 04/18/2019 If Site Rite image not attached, placement could not be confirmed due to current cardiac rhythm.  Dg Knee 2 Views Right  Result Date: 04/17/2019 CLINICAL DATA:  Irrigation and debridement of right knee. Hardware removal from right tibia. EXAM: RIGHT KNEE - 3 VIEW; DG C-ARM 61-120 MIN Radiation exposure index: 1.99 mGy. COMPARISON:  Radiographs of January 20, 2019. FINDINGS: Four intraoperative fluoroscopic images of the right knee demonstrate the patient be status post surgical internal fixation of comminuted tibial plateau fracture. IMPRESSION:  Fluoroscopic guidance provided during right knee surgery. Electronically Signed   By: Marijo Conception M.D.   On: 04/17/2019 11:38     Assessment/Plan: R septic knee with hardware Prev melanoma (cured per pt) Prev Hep C   Total days of antibiotics: 3 rifampin/vanco/ceftriaxone  Continue to watch his op Cx, ngtd.  Continued pain.  He is not clear how he got Hep C. Denies use of rec drugs.  PIC team note reviewed Appreciate pharm monitoring of Vanco levels (most recent 1)          Bobby Rumpf MD, FACP Infectious Diseases (pager) [phone] [email or web address] 04/19/2019, 8:53 AM  LOS: 2 days

## 2019-04-19 NOTE — Progress Notes (Signed)
Subjective: 2 Days Post-Op Procedure(s) (LRB): IRRIGATION AND DEBRIDEMENT RIGHT KNEE (Right) HARDWARE REMOVAL RIGHT TIBIA (Right) Patient reports pain as severe.    Objective: Vital signs in last 24 hours: Temp:  [98 F (36.7 C)-98.6 F (37 C)] 98 F (36.7 C) (04/26 0544) Pulse Rate:  [78-84] 80 (04/26 1103) Resp:  [15-16] 15 (04/26 1103) BP: (161-184)/(95-105) 161/95 (04/26 1103) SpO2:  [96 %-99 %] 98 % (04/26 1103)  Intake/Output from previous day: 04/25 0701 - 04/26 0700 In: 2181.1 [P.O.:462; IV Piggyback:1719.1] Out: 2190 [Urine:2150; Drains:40] Intake/Output this shift: Total I/O In: -  Out: 1600 [Urine:1600]  Recent Labs    04/17/19 0933 04/18/19 0634  HGB 9.0* 8.4*   Recent Labs    04/17/19 0933 04/18/19 0634  WBC 6.2 6.3  RBC 3.66* 3.43*  HCT 31.1* 27.6*  PLT 363 376   Recent Labs    04/17/19 0933  NA 136  K 4.6  CL 102  CO2 23  BUN 18  CREATININE 0.85  GLUCOSE 90  CALCIUM 8.8*   Recent Labs    04/17/19 0740  INR 1.0     Assessment/Plan: 2 Days Post-Op Procedure(s) (LRB): IRRIGATION AND DEBRIDEMENT RIGHT KNEE (Right) HARDWARE REMOVAL RIGHT TIBIA (Right) Up with therapy Continue ABX therapy due to Post-op infection Discussed pain control with RN as well as pt.  Do not feel comfortable increasing current pain regimen, will discuss with Dr. Doreatha Martin for recommendations. IV team also called yesterday for "possible track marks on arms"  Concern for polysubstane abuse, pt denies, apparently records were reviewed from [facility] showing evidence of this but I do not have to review, will again discuss with Dr. Doreatha Martin.   Chriss Czar 04/19/2019, 2:33 PM

## 2019-04-19 NOTE — Plan of Care (Signed)

## 2019-04-19 NOTE — Evaluation (Signed)
Physical Therapy Evaluation Patient Details Name: Evan [name] MRN: [id number] DOB: 03-27-1971 Today's Date: 04/19/2019   History of Present Illness  Was recovering well from severely comminuted and displaced tibial plateau fracture (s/p external fixation with delayed open reduction internal fixation) knee pain started mid April; Septic arthritis, now s/p hardware removal and I&D R knee, TDWB and KI ordered;  has a pertinent past medical history of ADD (attention deficit disorder) (05/11/2013), ANXIETY (12/10/2007), BACK PAIN (08/03/2009), Chronic pain syndrome (01/28/2014), Depression (06/15/2014), , Hepatitis C (05/15/2013), HYPERTENSION (11/21/2007), Melanoma ([place]) (2015), Neuropathy  Clinical Impression   Patient is s/p above surgery resulting in functional limitations due to the deficits listed below (see PT Problem List). Was recovering from original injury relatively well, had started putting weight on RLE and had good Knee ROM per chart review; Presents to PT with pain RLE, decr functional mobility, decr activity tolerance; Still, overall walked well in hospital room with RW, opted to stay NWB RLE;  Patient will benefit from skilled PT to increase their independence and safety with mobility to allow discharge to the venue listed below.       Follow Up Recommendations Home health PT;Supervision - Intermittent    Equipment Recommendations  None recommended by PT(pretty well-equipped)    Recommendations for Other Services       Precautions / Restrictions Precautions Precautions: Fall Required Braces or Orthoses: Knee Immobilizer - Right Restrictions RLE Weight Bearing: Touchdown weight bearing      Mobility  Bed Mobility Overal bed mobility: Needs Assistance Bed Mobility: Supine to Sit     Supine to sit: Min assist     General bed mobility comments: Min assis tto help RLE off of bed; Able to elevate trunk to sit without physical assist  Transfers Overall transfer level:  Needs assistance Equipment used: Rolling walker (2 wheeled) Transfers: Sit to/from Stand Sit to Stand: Min guard         General transfer comment: [place] for safety and management of lines  Ambulation/Gait Ambulation/Gait assistance: Min guard Gait Distance (Feet): 25 Feet Assistive device: Rolling walker (2 wheeled) Gait Pattern/deviations: (did not toe touchdown RLE)     General Gait Details: Overall managing well with RW; opted to keep NWB for walking around the room with RW  Stairs            Wheelchair Mobility    Modified Rankin (Stroke Patients Only)       Balance                                             Pertinent Vitals/Pain Pain Assessment: 0-10 Pain Score: 9  Pain Location: R knee Pain Descriptors / Indicators: Aching Pain Intervention(s): Monitored during session;Premedicated before session    Home Living Family/patient expects to be discharged to:: Private residence Living Arrangements: Alone Available Help at Discharge: Family(can dc to parents' home) Type of Home: House Home Access: Level entry     Home Layout: One level Home Equipment: Walker - 2 wheels;Shower seat Additional Comments: Pt can also dc to his parents house in [place] if he needs to    Prior Function Level of Independence: Independent with assistive device(s)         Comments: was managing well after original injury at parents' home with DME; he tells me the MD had told him to start gently putting weight on the  RLE and work on knee ROM     Hand Dominance   Dominant Hand: Right    Extremity/Trunk Assessment   Upper Extremity Assessment Upper Extremity Assessment: Overall WFL for tasks assessed    Lower Extremity Assessment Lower Extremity Assessment: RLE deficits/detail RLE Deficits / Details: Knee immobilized in KI; Overall hesitant to move RLE with the anticipation of pain; sensation intact to light touch and active toe wiggle R  foot RLE: Unable to fully assess due to pain       Communication   Communication: No difficulties  Cognition Arousal/Alertness: Awake/alert Behavior During Therapy: WFL for tasks assessed/performed;Flat affect Overall Cognitive Status: Within Functional Limits for tasks assessed(for simple mobility tasks)                                        General Comments      Exercises     Assessment/Plan    PT Assessment Patient needs continued PT services  PT Problem List Decreased strength;Decreased range of motion;Decreased activity tolerance;Decreased balance;Decreased mobility;Decreased knowledge of use of DME;Decreased safety awareness;Decreased knowledge of precautions;Pain       PT Treatment Interventions DME instruction;Gait training;Functional mobility training;Therapeutic activities;Therapeutic exercise;Balance training;Patient/family education    PT Goals (Current goals can be found in the Care Plan section)  Acute Rehab PT Goals Patient Stated Goal: hopes to get out of hospital soon PT Goal Formulation: With patient Time For Goal Achievement: 05/03/19 Potential to Achieve Goals: Good    Frequency Min 5X/week   Barriers to discharge        Co-evaluation               AM-PAC PT "6 Clicks" Mobility  Outcome Measure Help needed turning from your back to your side while in a flat bed without using bedrails?: A Little Help needed moving from lying on your back to sitting on the side of a flat bed without using bedrails?: A Little Help needed moving to and from a bed to a chair (including a wheelchair)?: None Help needed standing up from a chair using your arms (e.g., wheelchair or bedside chair)?: None Help needed to walk in hospital room?: None Help needed climbing 3-5 steps with a railing? : A Little 6 Click Score: 21    End of Session Equipment Utilized During Treatment: Gait belt;Right knee immobilizer Activity Tolerance: Patient tolerated  treatment well Patient left: in chair;with call bell/phone within reach;with chair alarm set Nurse Communication: Mobility status PT Visit Diagnosis: Other abnormalities of gait and mobility (R26.89)    Time: 0272-5366 PT Time Calculation (min) (ACUTE ONLY): 20 min   Charges:   PT Evaluation $PT Eval Moderate Complexity: [place] Pager [phone] Office [place] 04/19/2019, 3:54 PM

## 2019-04-19 NOTE — Progress Notes (Signed)
Pharmacy Antibiotic Note  Evan [name] is a 48 y.o. male admitted on 04/17/2019 with right knee septic arthritis s/p hardware removal and I&D on 4/24.  Pharmacy has been consulted for vancomycin dosing.   WBC 6.3, afebrile. Scr 0.85. On day #3 of antibiotics. Vancomycin peak came back at 45, vancomycin trough came back at 19.   Plan: Change vancomycin to 1250 mg IV every 12 hours for estAUC 500 Monitor renal function, clinical progress, and C/S F/U LOT  Height: 6\' 3"  (190.5 cm) Weight: 215 lb (97.5 kg) IBW/kg (Calculated) : 84.5  Temp (24hrs), Avg:98.3 F (36.8 C), Min:98 F (36.7 C), Max:98.6 F (37 C)  Recent Labs  Lab 04/17/19 0933 04/18/19 0634 04/19/19 0941 04/19/19 1847  WBC 6.2 6.3  --   --   CREATININE 0.85  --   --   --   VANCOTROUGH  --   --   --  19  VANCOPEAK  --   --  45*  --     Estimated Creatinine Clearance: 131.2 mL/min (by C-G formula based on SCr of 0.85 mg/dL).    Allergies  Allergen Reactions  . Contrast Media [Iodinated Diagnostic Agents] Shortness Of Breath and Swelling    Shortness of breath., throat swelling  . Fish Allergy Anaphylaxis    No reaction to shellfish    Antimicrobials this admission: vancomycin 4/24 >>  ceftriaxone 4/24 >>   Dose adjustments this admission: 4/26: VP 45, VT 19 - change to 1250 mg IV every 12 hours  Microbiology results: 4/24 right knee medial: NGTD 4/24 right synovial tissue: NGTD 4/24 right knee: NGTD  Thank you for allowing pharmacy to be a part of this patient's care.  Antonietta Jewel, PharmD, [place] Clinical Pharmacist  Pager: [phone] Phone: [phone] 04/19/2019 7:47 PM

## 2019-04-20 ENCOUNTER — Encounter (HOSPITAL_COMMUNITY): Payer: Self-pay | Admitting: General Practice

## 2019-04-20 DIAGNOSIS — G8929 Other chronic pain: Secondary | ICD-10-CM

## 2019-04-20 DIAGNOSIS — Z8781 Personal history of (healed) traumatic fracture: Secondary | ICD-10-CM

## 2019-04-20 DIAGNOSIS — F1911 Other psychoactive substance abuse, in remission: Secondary | ICD-10-CM

## 2019-04-20 DIAGNOSIS — Z923 Personal history of irradiation: Secondary | ICD-10-CM

## 2019-04-20 LAB — BASIC METABOLIC PANEL
Anion gap: 11 (ref 5–15)
BUN: 8 mg/dL (ref 6–20)
CO2: 27 mmol/L (ref 22–32)
Calcium: 8.8 mg/dL — ABNORMAL LOW (ref 8.9–10.3)
Chloride: 98 mmol/L (ref 98–111)
Creatinine, Ser: 0.76 mg/dL (ref 0.61–1.24)
GFR calc Af Amer: >60 mL/min (ref >60)
GFR calc non Af Amer: >60 mL/min (ref >60)
Glucose, Bld: 101 mg/dL — ABNORMAL HIGH (ref 70–99)
Potassium: 4 mmol/L (ref 3.5–5.1)
Sodium: 136 mmol/L (ref 135–145)

## 2019-04-20 NOTE — Plan of Care (Signed)
  Problem: Education: Goal: Knowledge of General Education information will improve Description: Including pain rating scale, medication(s)/side effects and non-pharmacologic comfort measures Outcome: Progressing   Problem: Health Behavior/Discharge Planning: Goal: Ability to manage health-related needs will improve Outcome: Progressing   Problem: Clinical Measurements: Goal: Respiratory complications will improve Outcome: Progressing Goal: Cardiovascular complication will be avoided Outcome: Progressing   Problem: Activity: Goal: Risk for activity intolerance will decrease Outcome: Progressing   Problem: Nutrition: Goal: Adequate nutrition will be maintained Outcome: Progressing   Problem: Coping: Goal: Level of anxiety will decrease Outcome: Progressing   Problem: Elimination: Goal: Will not experience complications related to urinary retention Outcome: Progressing   Problem: Pain Managment: Goal: General experience of comfort will improve Outcome: Progressing   Problem: Safety: Goal: Ability to remain free from injury will improve Outcome: Progressing   Problem: Skin Integrity: Goal: Risk for impaired skin integrity will decrease Outcome: Progressing   

## 2019-04-20 NOTE — Progress Notes (Signed)
Orthopaedic Trauma Progress Note  S: Patient doing okay this morning. Pain very difficult to control over the weekend, a little better this morning since increase in dosages of pain meds. Describes some burning over the medial and lateral knee, which is right over the incisions so this is to be expected. Otherwise is doing okay and doesn't have any concerns. PICC placed yesterday.   O:  Vitals:   04/19/19 2001 04/20/19 0432  BP: (!) 161/92 (!) 155/85  Pulse: 82 88  Resp: 16 16  Temp: 98.4 F (36.9 C) 98.5 F (36.9 C)  SpO2: 100% 98%    General - Laying in bed comfortably, NAD  Cardiac - Heart regular rate and rhtyhm Respiratory - No increased work of breathing. Clear to auscultation anterior lung fields bilaterally Right Lower Extremity - Vac and drain with minimal output. Swelling has improved significantly from prior to surgery. Dressings and incisional vac removed. Incisions healing well. No erythema noted. Tenderness to palpation of the knee and lower leg. Full ankle ROM. Knee ROM not assessed at this time. Neurovascularly intact.   Imaging: Stable post op imaging. CT scan of right knee shows some bridging callus formation posteriorly on the sagittal views. The reduction remains in appropriate alignment  Labs:  Results for orders placed or performed during the hospital encounter of 04/17/19 (from the past 24 hour(s))  Vancomycin, peak     Status: Abnormal   Collection Time: 04/19/19  9:41 AM  Result Value Ref Range   Vancomycin Pk 45 (H) 30 - 40 ug/mL  Vancomycin, trough     Status: None   Collection Time: 04/19/19  6:47 PM  Result Value Ref Range   Vancomycin Tr 19 15 - 20 ug/mL  Basic metabolic panel     Status: Abnormal   Collection Time: 04/20/19  4:35 AM  Result Value Ref Range   Sodium 136 135 - 145 mmol/L   Potassium 4.0 3.5 - 5.1 mmol/L   Chloride 98 98 - 111 mmol/L   CO2 27 22 - 32 mmol/L   Glucose, Bld 101 (H) 70 - 99 mg/dL   BUN 8 6 - 20 mg/dL   Creatinine,  Ser 0.76 0.61 - 1.24 mg/dL   Calcium 8.8 (L) 8.9 - 10.3 mg/dL   GFR calc non Af Amer >60 >60 mL/min   GFR calc Af Amer >60 >60 mL/min   Anion gap 11 5 - 15    Assessment: 48 year old male with wound infection following ORIF right tibial plateau 12/2018  Injuries: 1. Right bicondylar tibial plateau fracture s/p ORIF 01/20/19 with removal of one medial screw 04/17/19 2. Right leg postoperative infection s/p I&D right tibia wound/infection 04/17/19 3. right knee septic arthritis s/p I&D 04/17/19   Weightbearing: TDWB RLE  Insicional and dressing care: Dressing, incisional VAC, drain removed today. Dressings can be changed as needed  Orthopedic device(s): knee immobilizer RLE  Knee immobilizer can be removed at night.  CV/Blood loss: Hgb stable. Hemodynamically stable  Pain management:  1. Tylenol 650 mg q 6 hours scheduled 2. Robaxin 750 mg q 8 hours PRN 3. Oxycodone 20 mg q 4 hours PRN 4. Neurontin 300 mg TID 5. Dilaudid 3 mg q 3 hours PRN  VTE prophylaxis: Aspirin 325 mg daily  ID: Per ID. Currently on vancomycin and ceftriaxone  Foley/Lines: No foley, Single lumen PICC LUE  Medical co-morbidities: No significant medical problems but questionable drug use history  Impediments to Fracture Healing: Current post op infection  Dispo:  PT evaluating, recommending [facility]. Patient will likely be ready for discharge tomorrow or Wednesday. Will need to wean off of IV pain medications.  Follow - up plan: 2 weeks   Fumi Guadron A. [facility] ?[phone]? (phone)
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
## 2019-04-20 NOTE — TOC Initial Note (Addendum)
Transition of Care [facility]) - Initial/Assessment Note    Patient Details  Name: Evan [name] MRN: [id number] Date of Birth: 10/24/1971  Transition of [facility]) CM/SW Contact:    Marilu Favre, RN Phone Number: 04/20/2019, 12:11 PM  Clinical Narrative:                 Per notes there is concern of history of polysubstance abuse. Instant Patrecia Pace PA, if so patient cannot discharge with PICC line.  Patrecia Pace aware, patient denies use of polysubstance, per PA patient will be discharged with PICC line.   It's [facility] week to accept uninsured patient's referral given to [name] with [name], unable to accept due to discharging address is their [place] branch who do not agree to work with uninsured patient's. Discussed with Zack AD, was advised to tell [facility] they need to find an agency. [name] aware, awaiting call back.     Patient plans to discharge to his parents home : [address], [place], [place] [address]  Patient is no longer active with Dr Cathlean Cower as PCP, he wants to follow up with [facility] scheduled appointment for Apr 28, 2019 at 2 pm .   Referral given to [facility] with [facility]   Referral for Home health given to [name] with [facility] , [facility] to [facility]   Asked MD and PA for home health orders for RN and PT   Will follow for discharge prescriptions to see if patient qualifies for [facility]   Expected Discharge Plan: [place] Barriers to Discharge: Continued Medical Work up   Patient Goals and CMS Choice Patient states their goals for this hospitalization and ongoing recovery are:: to go home  CMS Medicare.gov Compare Post Acute Care list provided to:: Patient Choice offered to / list presented to : Patient  Expected Discharge Plan and Services Expected Discharge Plan: [place] In-house  Referral: Financial Counselor Discharge Planning Services: CM Consult Post Acute Care Choice: [place] arrangements for the past 2 months: Single Family Home                   DME Agency: NA       HH Arranged: RN, PT   Date HH Agency Contacted: 04/20/19 Time HH Agency Contacted: 1210 Representative spoke with at [place]: [name]   Prior Living Arrangements/Services Living arrangements for the past 2 months: [place] Lives with:: Parents Patient language and need for interpreter reviewed:: Yes Do you feel safe going back to the place where you live?: Yes      Need for Family Participation in Patient Care: Yes (Comment) Care giver support system in place?: Yes (comment) Current home services: Home PT, Home RN Criminal Activity/Legal Involvement Pertinent to Current Situation/Hospitalization: No - Comment as needed  Activities of Daily [place] Devices/Equipment: Environmental consultant (specify type), Wheelchair ADL Screening (condition at time of admission) Patient's cognitive ability adequate to safely complete daily activities?: Yes Is the patient deaf or have difficulty hearing?: No Does the patient have difficulty seeing, even when wearing glasses/contacts?: No Does the patient have difficulty concentrating, remembering, or making decisions?: No Patient able to express need for assistance with ADLs?: Yes Independently performs ADLs?: Yes (appropriate for developmental age) Does the patient have difficulty walking or climbing stairs?: Yes Weakness  of Legs: Right Weakness of Arms/Hands: None  Permission Sought/Granted Permission sought to share information with : Other (comment) Permission granted to share information with : Yes, Verbal Permission Granted     Permission granted to share info w AGENCY: [facility], [facility]         Emotional Assessment Appearance:: Appears stated age Attitude/Demeanor/Rapport: Engaged Affect  (typically observed): Accepting Orientation: : Oriented to Self, Oriented to Place, Oriented to  Time, Oriented to Situation   Psych Involvement: No (comment)  Admission diagnosis:  Septic arthritis of knee, right [facility]) [M00.9] Patient Active Problem List   Diagnosis Date Noted  . Fracture of left tibial plateau   . Septic arthritis of knee, right ([place]) 04/17/2019  . Hardware complicating wound infection ([place]) 04/17/2019  . Hepatitis C virus infection cured after antiviral drug therapy 04/17/2019  . Acute lateral meniscus tear of right knee 01/23/2019  . Displaced fracture of right tibial tuberosity, initial encounter for closed fracture 01/16/2019  . Closed bicondylar fracture of right tibial plateau 01/15/2019  . Cough 10/31/2016  . Wheezing 10/31/2016  . Encounter for general adult medical examination with abnormal findings 01/27/2016  . Depression 06/15/2014  . Acute bronchitis 01/28/2014  . Chronic pain syndrome 01/28/2014  . Hepatitis C 05/15/2013  . ADD (attention deficit disorder) 05/11/2013  . 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 Eczema 12/30/2012  . Paronychia 12/30/2012  . Metastatic melanoma 10/17/2011  . Chest pain 10/17/2011  . Preventative health care 05/14/2011  . Insomnia 12/20/2008  . Anxiety state 12/10/2007  . ERECTILE DYSFUNCTION 12/10/2007  . Essential hypertension 11/21/2007   PCP:  Biagio Borg, MD Pharmacy:   [facility], [place] [address] ([address] [place] [address] Phone: [phone] Fax: [phone]     Social Determinants of Health (SDOH) Interventions    Readmission Risk Interventions No flowsheet data found.

## 2019-04-21 DIAGNOSIS — F1590 Other stimulant use, unspecified, uncomplicated: Secondary | ICD-10-CM

## 2019-04-21 DIAGNOSIS — F1910 Other psychoactive substance abuse, uncomplicated: Secondary | ICD-10-CM

## 2019-04-21 MED ORDER — HYDROMORPHONE HCL 1 MG/ML IJ SOLN
2.0000 mg | INTRAMUSCULAR | Status: DC | PRN
  Administered 2019-04-21 – 2019-04-24 (×20): 2 mg via INTRAVENOUS
  Filled 2019-04-21 (×20): qty 2

## 2019-04-21 NOTE — Progress Notes (Signed)
Orthopaedic Trauma Progress Note  S: Patient doing okay. Pain is better controlled but continues to have some burning over the medial and lateral aspects of knee over incisions. Due to history of prior IV drug use noted in the chart from 2019, patient will not be able to be discharged with a PICC line. Infectious disease currently recommending patient complete 3 weeks of empiric IV ABX with rocephin/vanc and PO rifampin here in the hospital. After this time, if his platelet count remains within normal limits, he could complete induction treatment with 3 weeks of PO zyvox followed by 6 months of PO consolidative tx with oral ABX. Patient somewhat distraught about this conversation with infectious disease. He denies drug use noted in the chart, describes the incident in [facility] ER in 2019 differently than what is documented in the chart. States he can not stay here for another 3 weeks as he does not have insurance. States he spoke with his parents on the phone this morning and updated them on the current situation.    O:  Vitals:   04/20/19 2011 04/21/19 0411  BP: (!) 149/94 (!) 142/86  Pulse: 86 91  Resp: 16 17  Temp: 99.1 F (37.3 C) 98.8 F (37.1 C)  SpO2: 100% 97%    General - Laying in bed, NAD, pleasant and cooperative  Cardiac - Heart regular rate and rhtyhm Respiratory - No increased work of breathing. Clear to auscultation anterior lung fields bilaterally Right Lower Extremity - Knee immobilizer not currently in place. Dressing in place over right knee remains clean, dry, intact. Swelling continues to improve. Tenderness to palpation of the knee and lower leg. Full ankle ROM but states this causes discomfort in the lower leg with movement. Able to bend his knee some while laying in bed on his side. Sensation intact distally. Able to wiggle toes. Neurovascularly intact.   Imaging: Stable post op imaging. CT scan of right knee shows some bridging callus formation posteriorly on the sagittal  views. The reduction remains in appropriate alignment  Labs:  No results found for this or any previous visit (from the past 24 hour(s)).  Assessment: 48 year old male with wound infection following ORIF right tibial plateau 01/20/2019  Injuries: 1. Right bicondylar tibial plateau fracture s/p ORIF 01/20/19 with removal of one medial screw 04/17/19 2. Right leg postoperative infection s/p I&D right tibia wound/infection 04/17/19 3. right knee septic arthritis s/p I&D 04/17/19   Weightbearing: TDWB RLE  Insicional and dressing care: Dressing can be changed as needed  Orthopedic device(s): knee immobilizer RLE  Knee immobilizer can be removed at night, please put immobilizer back on for therapy sessions.  CV/Blood loss: Hgb stable. Hemodynamically stable  Pain management:  1. Tylenol 650 mg q 6 hours scheduled 2. Robaxin 750 mg q 8 hours PRN 3. Oxycodone 20 mg q 4 hours PRN 4. Neurontin 300 mg TID 5. Dilaudid 3 mg q 3 hours PRN  VTE prophylaxis: Aspirin 325 mg daily  ID: Per ID. Currently on vancomycin, ceftriaxone, rifampin  Foley/Lines: No foley, Single lumen PICC LUE  Medical co-morbidities: No significant medical problems but questionable drug use history  Impediments to Fracture Healing: Current post op infection  Dispo: PT evaluating, recommending HH PT. ID no longer recommending patient be discharged with PICC, instead recommending IV ABX x 3 weeks in hospital followed by outpatient PO ABX x 3 weeks.   Follow - up plan: TBD   Zacchary Pompei [facility] ?[phone]? (phone)
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
## 2019-04-21 NOTE — Care Management (Addendum)
At discharge [facility] potentially accept referral for HHPT .  Patient uninsured and will discharge to [facility]. If patient still requires home health PT. Dorian Pod with [facility] will see if he qualifies for charity.  Magdalen Spatz RN BSN [phone]

## 2019-04-22 DIAGNOSIS — S82141K Displaced bicondylar fracture of right tibia, subsequent encounter for closed fracture with nonunion: Secondary | ICD-10-CM

## 2019-04-22 LAB — AEROBIC/ANAEROBIC CULTURE W GRAM STAIN (SURGICAL/DEEP WOUND)
Culture: NO GROWTH
Culture: NO GROWTH
Culture: NO GROWTH

## 2019-04-22 LAB — VANCOMYCIN, TROUGH: Vancomycin Tr: 16 ug/mL (ref 15–20)

## 2019-04-22 LAB — AEROBIC/ANAEROBIC CULTURE (SURGICAL/DEEP WOUND)

## 2019-04-22 NOTE — Plan of Care (Signed)
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
## 2019-04-22 NOTE — Progress Notes (Signed)
Pharmacy Antibiotic Note  Evan [name] is a 48 y.o. male admitted on 04/17/2019 with right knee septic arthritis s/p hardware removal and I&D on 4/24.  Pharmacy has been consulted for vancomycin dosing.   Plan: Continue Vancomycin 1250 mg IV every 12 hours for est AUC 500 Monitor renal function, clinical progress, and C/S F/U LOT  Height: 6\' 3"  (190.5 cm) Weight: 215 lb (97.5 kg) IBW/kg (Calculated) : 84.5  Temp (24hrs), Avg:98.8 F (37.1 C), Min:98.6 F (37 C), Max:99 F (37.2 C)  Recent Labs  Lab 04/17/19 0933 04/18/19 0634 04/19/19 0941 04/19/19 1847 04/20/19 0435 04/22/19 0630  WBC 6.2 6.3  --   --   --   --   CREATININE 0.85  --   --   --  0.76  --   VANCOTROUGH  --   --   --  19  --  16  VANCOPEAK  --   --  45*  --   --   --     Estimated Creatinine Clearance: 139.4 mL/min (by C-G formula based on SCr of 0.76 mg/dL).    Allergies  Allergen Reactions  . Contrast Media [Iodinated Diagnostic Agents] Shortness Of Breath and Swelling    Shortness of breath., throat swelling  . Fish Allergy Anaphylaxis    No reaction to shellfish   Antimicrobials this admission: vancomycin 4/24 >>  ceftriaxone 4/24 >>   Dose adjustments this admission: 4/26: VP 45, VT 19 - change to 1250 mg IV every 12 hours 4/29: VT 16 - no change abx  Microbiology results: 4/24 right knee medial: NGTD 4/24 right synovial tissue: NGTD 4/24 right knee: NGTD  Thank you for allowing pharmacy to be a part of this patient's care.  Minda Ditto PharmD Clinical Pharmacist  Phone: [phone] (till 5:30p) 04/22/2019 10:00 AM
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
## 2019-04-22 NOTE — TOC Initial Note (Signed)
Transition of Care [facility]) - Initial/Assessment Note    Patient Details  Name: Evan [name] MRN: [id number] Date of Birth: [birth year]  Transition of [facility]) CM/SW Contact:    Marilu Favre, RN Phone Number: 04/22/2019, 11:15 AM  Clinical Narrative:                  Patient to stay at hospital for IV ABX. PT updated recommendation is no PT follow up. Re scheduled follow up appointment at [facility] for May 19, 2019 at 1100 am  Expected Discharge Plan: Home/Self Care Barriers to Discharge: Active Substance Use with PICC Line   Patient Goals and CMS Choice Patient states their goals for this hospitalization and ongoing recovery are:: to go home  CMS Medicare.gov Compare Post Acute Care list provided to:: Patient Choice offered to / list presented to : NA  Expected Discharge Plan and Services Expected Discharge Plan: Home/Self Care In-house Referral: Financial Counselor Discharge Planning Services: CM Consult Post Acute Care Choice: [place] arrangements for the past 2 months: Single Family Home                   DME Agency: NA       HH Arranged: NA(patient to stay in hospital for IV ABX , PT updated recommendation : No PT follow up ) [place] Agency: NA Date [place]: 04/20/19 Time HH Agency Contacted: 1210 Representative spoke with at [place]: [name]   Prior Living Arrangements/Services Living arrangements for the past 2 months: [place] with:: Parents Patient language and need for interpreter reviewed:: Yes Do you feel safe going back to the place where you live?: Yes      Need for Family Participation in Patient Care: Yes (Comment) Care giver support system in place?: Yes (comment) Current home services: Home PT, Home RN Criminal Activity/Legal Involvement Pertinent to Current Situation/Hospitalization: No - Comment as needed  Activities of Daily Living Home Assistive Devices/Equipment: Environmental consultant  (specify type), Wheelchair ADL Screening (condition at time of admission) Patient's cognitive ability adequate to safely complete daily activities?: Yes Is the patient deaf or have difficulty hearing?: No Does the patient have difficulty seeing, even when wearing glasses/contacts?: No Does the patient have difficulty concentrating, remembering, or making decisions?: No Patient able to express need for assistance with ADLs?: Yes Does the patient have difficulty dressing or bathing?: No Independently performs ADLs?: Yes (appropriate for developmental age) Does the patient have difficulty walking or climbing stairs?: No Weakness of Legs: Right Weakness of Arms/Hands: None  Permission Sought/Granted Permission sought to share information with : Other (comment) Permission granted to share information with : Yes, Verbal Permission Granted     Permission granted to share info w AGENCY: [facility], [facility]         Emotional Assessment Appearance:: Appears stated age Attitude/Demeanor/Rapport: Engaged Affect (typically observed): Accepting Orientation: : Oriented to Self, Oriented to Place, Oriented to  Time, Oriented to Situation   Psych Involvement: No (comment)  Admission diagnosis:  Septic arthritis of knee, right [facility]) [M00.9] Patient Active Problem List   Diagnosis Date Noted  . IV drug abuse ([place])   . Fracture of left tibial plateau   . Septic arthritis of knee, right ([place]) 04/17/2019  . Hardware complicating wound infection ([place]) 04/17/2019  . Hepatitis C virus infection cured after antiviral drug therapy 04/17/2019  . Acute lateral meniscus tear of right knee 01/23/2019  . Displaced fracture  of right tibial tuberosity, initial encounter for closed fracture 01/16/2019  . Closed bicondylar fracture of right tibial plateau 01/15/2019  . Cough 10/31/2016  . Wheezing 10/31/2016  . Encounter for general adult medical examination with abnormal findings  01/27/2016  . Depression 06/15/2014  . Acute bronchitis 01/28/2014  . Chronic pain syndrome 01/28/2014  . Hepatitis C 05/15/2013  . ADD (attention deficit disorder) 05/11/2013  . 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 Eczema 12/30/2012  . Paronychia 12/30/2012  . Metastatic melanoma 10/17/2011  . Chest pain 10/17/2011  . Preventative health care 05/14/2011  . Insomnia 12/20/2008  . Anxiety state 12/10/2007  . ERECTILE DYSFUNCTION 12/10/2007  . Essential hypertension 11/21/2007   PCP:  Biagio Borg, MD Pharmacy:   [facility] [place], [place] [address] [place] [address] Phone: [phone] Fax: [phone]     Social Determinants of Health (SDOH) Interventions    Readmission Risk Interventions No flowsheet data found.

## 2019-04-22 NOTE — Progress Notes (Signed)
Ortho Trauma Progress Note:  Patient seen and examined this morning.  Had a long discussion with the patient yesterday evening and also discussed with him again this morning.  We greatly appreciate Dr. Prince Rome and his infectious disease team with their assistance in their accommodations in terms of his regimen.  The patient has documented IVC emergency room visit at [facility] that was approximately 1 year ago.  In that documentation there is a clear reference to IV drug abuse.  Since my time with the patient he has been very compliant with his pain medication, albeit being very difficult to control and on high levels of narcotics due to his previous narcotic tolerance, likely from his cancer diagnosis and previous chronic long term narcotic use.  Without IV antibiotics I do not feel that he would effectively clear his infection.  This would leave him at high risk for development of chronic nonunion with need for further surgeries in the future with a small possibility of amputation.  Although there was no growth to date on the cultures, the clinical picture of his infection and intraoperative evaluation showed that it was a clear postoperative infection.  I discussed this along with the need for the IV antibiotics and broad-spectrum dosage and the need for bone penetration.  I reviewed the potential p.o. treatment options with Dr. Prince Rome and unfortunately Zyvox has a significant at risk profile with prolonged use greater than 3 weeks.  1 of the major side effects of concern is a severe thrombocytopenia leaving him at risk for bleeding problems and hematoma formation with secondary seeding and repeat infections. Other oral antibiotics would likely not address the broad-spectrum nature needed as well as the adequate bone penetration to clear any residual infection at the fracture site.    Although the patient is upset that he will have to stay, he is in understanding of this concern.  We will plan with the infectious  disease team's outline of 3 weeks of IV antibiotics with transition to p.o. Zyvox for another 3 weeks with a long-term suppression.  Over the next 3 weeks we will plan to try to wean client off of the high doses of narcotics.  We also continue with therapy to improve his outcome for when he discharges.  Ultimately, he will need his hardware removed.  This will be after he heals his fracture which may be another 2 to 3 months.  He will likely be on chronic antibiotic suppression until this occurs.  Shona Needles, MD [facility] [phone] (phone) [phone] (office) [email or web address]

## 2019-04-23 ENCOUNTER — Encounter (HOSPITAL_COMMUNITY): Payer: Self-pay | Admitting: Student

## 2019-04-23 LAB — BASIC METABOLIC PANEL
Anion gap: 9 (ref 5–15)
BUN: 13 mg/dL (ref 6–20)
CO2: 25 mmol/L (ref 22–32)
Calcium: 8.9 mg/dL (ref 8.9–10.3)
Chloride: 101 mmol/L (ref 98–111)
Creatinine, Ser: 0.87 mg/dL (ref 0.61–1.24)
GFR calc Af Amer: >60 mL/min (ref >60)
GFR calc non Af Amer: >60 mL/min (ref >60)
Glucose, Bld: 106 mg/dL — ABNORMAL HIGH (ref 70–99)
Potassium: 4.2 mmol/L (ref 3.5–5.1)
Sodium: 135 mmol/L (ref 135–145)

## 2019-04-23 NOTE — Progress Notes (Signed)
Orthopaedic Trauma Progress Note  S: Patient doing okay. Pain is better controlled but continues to have some burning over incisions. Pain is worse medially. Very tender with even light touch. Otherwise he is doing okay and doesn't have any new complaints. Discussed with patient that over the next 3 weeks we will plan to try to wean him off the high doses of narcotics. He is in agreement to this plan.  O:  Vitals:   04/22/19 1933 04/23/19 0233  BP: (!) 148/82 (!) 141/85  Pulse: 93 95  Resp: 16 16  Temp: 99 F (37.2 C) 98.8 F (37.1 C)  SpO2: 98% 99%    General - Laying in bed, NAD, pleasant and cooperative  Cardiac - Heart regular rate and rhtyhm Respiratory - No increased work of breathing. Clear to auscultation anterior lung fields bilaterally Right Lower Extremity - Knee immobilizer not currently in place. Dressing in place over right knee removed. Incisions are clean, dry, intact. Healing well. Swelling continues to improve. Significant tenderness to palpation of the medial knee surrounding the incision. Full ankle ROM. Some knee ROM.  Sensation intact distally. Able to wiggle toes. Neurovascularly intact.   Imaging: Stable post op imaging. CT scan of right knee shows some bridging callus formation posteriorly on the sagittal views. The reduction remains in appropriate alignment  Labs:  Results for orders placed or performed during the hospital encounter of 04/17/19 (from the past 24 hour(s))  Basic metabolic panel     Status: Abnormal   Collection Time: 04/23/19  4:10 AM  Result Value Ref Range   Sodium 135 135 - 145 mmol/L   Potassium 4.2 3.5 - 5.1 mmol/L   Chloride 101 98 - 111 mmol/L   CO2 25 22 - 32 mmol/L   Glucose, Bld 106 (H) 70 - 99 mg/dL   BUN 13 6 - 20 mg/dL   Creatinine, Ser 0.87 0.61 - 1.24 mg/dL   Calcium 8.9 8.9 - 10.3 mg/dL   GFR calc non Af Amer >60 >60 mL/min   GFR calc Af Amer >60 >60 mL/min   Anion gap 9 5 - 15    Assessment: 48 year old male with  wound infection following ORIF right tibial plateau 01/20/2019  Injuries: 1. Right bicondylar tibial plateau fracture s/p ORIF 01/20/19 with removal of one medial screw 04/17/19 2. Right leg postoperative infection s/p I&D right tibia wound/infection 04/17/19 3. right knee septic arthritis s/p I&D 04/17/19   Weightbearing: TDWB RLE  Insicional and dressing care: Dressing can be changed as needed  Orthopedic device(s): knee immobilizer RLE  Knee immobilizer can be removed at night, please put immobilizer back on for therapy sessions.  CV/Blood loss: Hgb stable. Hemodynamically stable  Pain management:  1. Tylenol 650 mg q 6 hours scheduled 2. Robaxin 750 mg q 8 hours PRN 3. Oxycodone 20 mg q 4 hours PRN 4. Neurontin 300 mg TID 5. Dilaudid 2 mg q 3 hours PRN  VTE prophylaxis: Aspirin 325 mg daily  ID: Per ID  Foley/Lines: No foley, Single lumen PICC LUE  Medical co-morbidities: No significant medical problems but questionable drug use history  Impediments to Fracture Healing: Current post op infection  Dispo: Patient will remain in hospital for IV ABX x 3 weeks, followed by transition to outpatient PO ABX thereafter. Will continue with therapy to improve his outcome for when he discharges.     Follow - up plan: TBD   Iria Jamerson A. Carmie Kanner [facility] ?([phone]? (phone)
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
## 2019-04-23 NOTE — Plan of Care (Signed)
  Problem: Clinical Measurements: Goal: Ability to maintain clinical measurements within normal limits will improve Outcome: Progressing Goal: Will remain free from infection Outcome: Progressing Goal: Diagnostic test results will improve Outcome: Progressing   Problem: Nutrition: Goal: Adequate nutrition will be maintained Outcome: Progressing   Problem: Coping: Goal: Level of anxiety will decrease Outcome: Progressing   Problem: Elimination: Goal: Will not experience complications related to bowel motility Outcome: Progressing Goal: Will not experience complications related to urinary retention Outcome: Progressing   Problem: Pain Managment: Goal: General experience of comfort will improve Outcome: Progressing   Problem: Safety: Goal: Ability to remain free from injury will improve Outcome: Progressing   Problem: Skin Integrity: Goal: Risk for impaired skin integrity will decrease Outcome: Progressing   

## 2019-04-24 MED ORDER — HYDROMORPHONE HCL 1 MG/ML IJ SOLN
1.0000 mg | INTRAMUSCULAR | Status: DC | PRN
  Administered 2019-04-24 (×2): 1 mg via INTRAVENOUS
  Filled 2019-04-24 (×2): qty 1

## 2019-04-24 MED ORDER — HYDROMORPHONE HCL 1 MG/ML IJ SOLN
2.0000 mg | INTRAMUSCULAR | Status: DC | PRN
  Administered 2019-04-24 – 2019-04-27 (×14): 2 mg via INTRAVENOUS
  Filled 2019-04-24 (×14): qty 2

## 2019-04-24 MED ORDER — AMITRIPTYLINE HCL 50 MG PO TABS
50.0000 mg | ORAL_TABLET | Freq: Every day | ORAL | Status: DC
  Administered 2019-04-25 – 2019-05-08 (×15): 50 mg via ORAL
  Filled 2019-04-24 (×16): qty 1
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
## 2019-04-24 NOTE — Progress Notes (Signed)
Orthopaedic Trauma Progress Note  S: Patient doing okay. Continues to have some burning over incisions. Pain is worse medially. Very tender with even light touch. Otherwise he is doing okay and doesn't have any new complaints. Will begin trying to wean off IV pain medications today and through the weekend.  O:  Vitals:   04/23/19 1930 04/24/19 0445  BP: (!) 145/92 121/68  Pulse: 89 78  Resp: 17 16  Temp: 99 F (37.2 C) 98.5 F (36.9 C)  SpO2: 100% 99%    General - Sitting up in bed, NAD, pleasant and cooperative  Cardiac - Heart regular rate and rhtyhm Respiratory - No increased work of breathing. Clear to auscultation anterior lung fields bilaterally Right Lower Extremity - Knee immobilizer not currently in place. Distal suture of medial incision with some serosanguinous drainage, otherwise incisions are clean, dry, intact. Healing well. Knee effusions present but overall swelling continues to improve. Tenderness to palpation of the medial knee surrounding the incision. Full ankle ROM. Some knee ROM, mild flexion contracture.  Sensation intact distally. Able to wiggle toes. Neurovascularly intact.   Imaging: Stable post op imaging. CT scan of right knee shows some bridging callus formation posteriorly on the sagittal views. The reduction remains in appropriate alignment  Labs:  No results found for this or any previous visit (from the past 24 hour(s)).  Assessment: 48 year old male with wound infection following ORIF right tibial plateau 01/20/2019  Injuries: 1. Right bicondylar tibial plateau fracture s/p ORIF 01/20/19 with removal of one medial screw 04/17/19 2. Right leg postoperative infection s/p I&D right tibia wound/infection 04/17/19 3. right knee septic arthritis s/p I&D 04/17/19   Weightbearing: TDWB RLE  Insicional and dressing care: Dressing can be changed as needed  Orthopedic device(s): knee immobilizer RLE  Knee immobilizer can be removed at night, please put  immobilizer back on for therapy sessions.  CV/Blood loss: Hgb stable. Hemodynamically stable  Pain management:  1. Tylenol 650 mg q 6 hours scheduled 2. Robaxin 750 mg q 8 hours PRN 3. Oxycodone 20 mg q 4 hours PRN 4. Neurontin 300 mg TID 5. Dilaudid 2 mg q 3 hours PRN  VTE prophylaxis: Aspirin 325 mg daily  ID: Per ID  Dispo: Patient will remain in hospital for IV ABX x 3 weeks, followed by transition to outpatient PO ABX thereafter. Will continue with therapy to improve his outcome for when he discharges.    Follow - up plan: TBD   Chanise Habeck A. Carmie Kanner [facility] ?([phone]? (phone)

## 2019-04-25 LAB — VANCOMYCIN, PEAK: Vancomycin Pk: 38 ug/mL (ref 30–40)
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
## 2019-04-25 NOTE — Progress Notes (Addendum)
Patient ID: Evan [name], male   DOB: June 30, 1971, 48 y.o.   MRN: [id number]     Subjective:  Patient reports pain as mild.  Patient in bed and in no acute distress.    Objective:   VITALS:   Vitals:   04/24/19 0445 04/24/19 1446 04/25/19 0020 04/25/19 0432  BP: 121/68 (!) 145/92 (!) 156/93 129/69  Pulse: 78 80 73 90  Resp: 16 16    Temp: 98.5 F (36.9 C) (!) 97.5 F (36.4 C) 98.3 F (36.8 C) 98.7 F (37.1 C)  TempSrc: Oral Oral Oral Oral  SpO2: 99% 100% 100% 97%  Weight:      Height:        ABD soft Sensation intact distally Dorsiflexion/Plantar flexion intact Incision: dressing C/D/I and no drainage   Lab Results  Component Value Date   WBC 6.3 04/18/2019   HGB 8.4 (L) 04/18/2019   HCT 27.6 (L) 04/18/2019   MCV 80.5 04/18/2019   PLT 376 04/18/2019   BMET    Component Value Date/Time   NA 135 04/23/2019 0410   K 4.2 04/23/2019 0410   CL 101 04/23/2019 0410   CO2 25 04/23/2019 0410   GLUCOSE 106 (H) 04/23/2019 0410   BUN 13 04/23/2019 0410   CREATININE 0.87 04/23/2019 0410   CALCIUM 8.9 04/23/2019 0410   GFRNONAA >60 04/23/2019 0410   GFRAA >60 04/23/2019 0410     Assessment/Plan: 8 Days Post-Op   Principal Problem:   Septic arthritis of knee, right (HCC) Active Problems:   Hardware complicating wound infection (HCC)   Hepatitis C virus infection cured after antiviral drug therapy   Fracture of right tibial plateau   IV drug abuse (HCC)   Advance diet Up with therapy Continue ABX therapy due to Post-op infection Patient will be extended stay for IV ABX   Lunette Stands 04/25/2019, 10:31 AM  Discussed and agree with above.  [name], MD Cell [phone]

## 2019-04-25 NOTE — Plan of Care (Signed)
  Problem: Coping: Goal: Level of anxiety will decrease Outcome: Progressing   Problem: Pain Managment: Goal: General experience of comfort will improve Outcome: Progressing   

## 2019-04-26 LAB — BASIC METABOLIC PANEL
Anion gap: 11 (ref 5–15)
BUN: 10 mg/dL (ref 6–20)
CO2: 23 mmol/L (ref 22–32)
Calcium: 9 mg/dL (ref 8.9–10.3)
Chloride: 102 mmol/L (ref 98–111)
Creatinine, Ser: 0.75 mg/dL (ref 0.61–1.24)
GFR calc Af Amer: >60 mL/min (ref >60)
GFR calc non Af Amer: >60 mL/min (ref >60)
Glucose, Bld: 101 mg/dL — ABNORMAL HIGH (ref 70–99)
Potassium: 4.4 mmol/L (ref 3.5–5.1)
Sodium: 136 mmol/L (ref 135–145)

## 2019-04-26 LAB — VANCOMYCIN, TROUGH: Vancomycin Tr: 15 ug/mL (ref 15–20)

## 2019-04-26 MED ORDER — VANCOMYCIN HCL IN DEXTROSE 1-5 GM/200ML-% IV SOLN
1000.0000 mg | Freq: Two times a day (BID) | INTRAVENOUS | Status: AC
  Administered 2019-04-26 – 2019-05-08 (×25): 1000 mg via INTRAVENOUS
  Filled 2019-04-26 (×26): qty 200

## 2019-04-26 NOTE — Progress Notes (Signed)
Patient ID: Evan [name], male   DOB: 1971-04-14, 47 y.o.   MRN: [id number]     Subjective:  Patient reports pain as mild to moderate.  Patient reports that the pain regimen is working  Objective:   VITALS:   Vitals:   04/25/19 0432 04/25/19 1428 04/25/19 1943 04/26/19 0319  BP: 129/69 (!) 149/102 136/73 (!) 135/95  Pulse: 90 93 86 89  Resp:      Temp: 98.7 F (37.1 C) 97.9 F (36.6 C) 97.6 F (36.4 C) 98 F (36.7 C)  TempSrc: Oral Oral Oral Oral  SpO2: 97% 100% 100% 100%  Weight:      Height:        ABD soft Sensation intact distally Dorsiflexion/Plantar flexion intact Incision: dressing C/D/I and no drainage   Lab Results  Component Value Date   WBC 6.3 04/18/2019   HGB 8.4 (L) 04/18/2019   HCT 27.6 (L) 04/18/2019   MCV 80.5 04/18/2019   PLT 376 04/18/2019   BMET    Component Value Date/Time   NA 136 04/26/2019 0417   K 4.4 04/26/2019 0417   CL 102 04/26/2019 0417   CO2 23 04/26/2019 0417   GLUCOSE 101 (H) 04/26/2019 0417   BUN 10 04/26/2019 0417   CREATININE 0.75 04/26/2019 0417   CALCIUM 9.0 04/26/2019 0417   GFRNONAA >60 04/26/2019 0417   GFRAA >60 04/26/2019 0417     Assessment/Plan: 9 Days Post-Op   Principal Problem:   Septic arthritis of knee, right (HCC) Active Problems:   Hardware complicating wound infection (HCC)   Hepatitis C virus infection cured after antiviral drug therapy   Fracture of right tibial plateau   IV drug abuse (HCC)   Advance diet Up with therapy Continue ABX therapy due to Post-op infection WBAT Dry dressing PRN   Lunette Stands 04/26/2019, 12:16 PM   [name], MD Cell [phone]

## 2019-04-26 NOTE — Progress Notes (Signed)
Pharmacy Antibiotic Note  Evan [name] is a 48 y.o. male admitted on 04/17/2019 with right knee septic arthritis s/p hardware removal and I&D on 4/24.  Pharmacy has been consulted for vancomycin dosing.   Plan: Change Vancomycin to 1000 mg IV every 12 hours for est AUC 500 - based on Vpk 38/tr 15 Monitor renal function, clinical progress, and C/S Plan 3 wk IV Vanc and Ceftriaxone, po Rifampin followed by 3wk Zyvox po  Height: 6\' 3"  (190.5 cm) Weight: 215 lb (97.5 kg) IBW/kg (Calculated) : 84.5  Temp (24hrs), Avg:97.8 F (36.6 C), Min:97.6 F (36.4 C), Max:98 F (36.7 C)  Recent Labs  Lab 04/20/19 0435 04/22/19 0630 04/23/19 0410 04/25/19 2226 04/26/19 0417 04/26/19 0820  CREATININE 0.76  --  0.87  --  0.75  --   VANCOTROUGH  --  16  --   --   --  15  VANCOPEAK  --   --   --  38  --   --     Estimated Creatinine Clearance: 139.4 mL/min (by C-G formula based on SCr of 0.75 mg/dL).    Allergies  Allergen Reactions  . Contrast Media [Iodinated Diagnostic Agents] Shortness Of Breath and Swelling    Shortness of breath., throat swelling  . Fish Allergy Anaphylaxis    No reaction to shellfish   Antimicrobials this admission: vancomycin 4/24 >>  ceftriaxone 4/24 >>  Rifampin 4/24 >>  Dose adjustments this admission: 4/26: VP 45, VT 19 - change to 1250 mg IV every 12 hours 4/29: VT 16 - no change abx 5/3 Vpk 38/tr 15, change to 1gm q12 for AUC 500  Microbiology results: 4/24 right knee medial: NGTD 4/24 right synovial tissue: NGTD 4/24 right knee: NGTD  Thank you for allowing pharmacy to be a part of this patient's care.  Minda Ditto PharmD Clinical Pharmacist  Phone: [phone] (till 5:30p) 04/26/2019 10:58 AM

## 2019-04-27 MED ORDER — HYDROMORPHONE HCL 1 MG/ML IJ SOLN
1.0000 mg | Freq: Four times a day (QID) | INTRAMUSCULAR | Status: DC | PRN
  Administered 2019-04-27 – 2019-05-01 (×16): 1 mg via INTRAVENOUS
  Filled 2019-04-27 (×16): qty 1

## 2019-04-27 NOTE — Progress Notes (Signed)
Patient called nurse to room with complaints of chills, shaking, states "thinks it is withdraws". Temperature 98.0. PA Judson Roch called and notified of findings. Orders to continue to monitor at this time. Pt in bed with call bell in reach. Will continue to monitor for remainder of shift and report to oncoming nurse.
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
## 2019-04-27 NOTE — Progress Notes (Signed)
Orthopaedic Trauma Progress Note  S: Patient doing okay this morning, states his pain is currently well controlled. Was able to ambulate with therapy this weekend, tried putting more weight through the right leg but noted increased pressure and discomfort when doing so. Otherwise, no new complaints. Notes tenderness around incisions is improving. Continues to have a "stinging" type sensartion across the knee when touching portions of  the medial incision. We are continuing to wean IV pain medications today, will begin weaning oral meds later this week as patient can tolerate.  O:  Vitals:   04/26/19 1947 04/27/19 0333  BP: 135/90 (!) 137/91  Pulse: 88 95  Resp:    Temp: 98.7 F (37.1 C) 98.3 F (36.8 C)  SpO2: 100% 97%    General - Laying in bed, NAD, pleasant and cooperative  Cardiac - Heart regular rate and rhtyhm Respiratory - No increased work of breathing. Clear to auscultation anterior lung fields bilaterally Right Lower Extremity - Swelling in knee and lower leg continuing to improve. Incisions clean, dry, intact. Healing well. Improving tenderness to palpation of the medial knee surrounding the incision. Full ankle ROM. Knee ROM improving, about 10 degree flexion contracture. Motor function and sensation intact distally. Neurovascularly intact.   Imaging: Stable post op imaging. CT scan of right knee shows some bridging callus formation posteriorly on the sagittal views. The reduction remains in appropriate alignment  Labs:  Results for orders placed or performed during the hospital encounter of 04/17/19 (from the past 24 hour(s))  Vancomycin, trough     Status: None   Collection Time: 04/26/19  8:20 AM  Result Value Ref Range   Vancomycin Tr 15 15 - 20 ug/mL    Assessment: 48 year old male with wound infection following ORIF right tibial plateau 01/20/2019  Injuries: 1. Right bicondylar tibial plateau fracture s/p ORIF 01/20/19 with removal of one medial screw 04/17/19 2.  Right leg postoperative infection s/p I&D right tibia wound/infection 04/17/19 3. Right knee septic arthritis s/p I&D 04/17/19   Weightbearing: Partial WB RLE  Insicional and dressing care: Dressing can be changed as needed  Orthopedic device(s): Knee immobilizer RLE  Knee immobilizer should be in place when ambulating, can be removed at all other times to work on knee ROM.    CV/Blood loss: Hgb stable. Hemodynamically stable  Pain management:  1. Tylenol 650 mg q 6 hours scheduled 2. Robaxin 750 mg q 8 hours PRN 3. Oxycodone 20 mg q 4 hours PRN 4. Neurontin 300 mg TID 5. Dilaudid 1 mg q 6 hours PRN  VTE prophylaxis: Aspirin 325 mg daily  ID: Per ID  Dispo: Patient will remain in hospital for IV ABX x 3 weeks, followed by transition to outpatient PO ABX thereafter. Will continue with physcial therapy to improve his outcome for when he discharges.    Follow - up plan: TBD   Yahshua Thibault A. [facility] ?[phone]? (phone)

## 2019-04-28 ENCOUNTER — Inpatient Hospital Stay: Payer: Self-pay | Admitting: Critical Care Medicine

## 2019-04-28 NOTE — Progress Notes (Signed)
Orthopaedic Trauma Progress Note  S: States that he feels that he was going through withdrawal after decreasing IV dialaudid. Pain an issue. No other complaints. Discussed with him the feeling of withdrawals and the need to wean him off of those quickly to decrease dependence.  O:  Vitals:   04/27/19 2137 04/28/19 0435  BP: 127/79 114/89  Pulse: 99 82  Resp: 18 18  Temp: 97.8 F (36.6 C) 98 F (36.7 C)  SpO2: 99% 99%    General - Laying in bed, NAD, pleasant and cooperative  Cardiac - Heart regular rate and rhtyhm Right Lower Extremity - Swelling in knee and lower leg continuing to improve. Incisions clean, dry, intact. Healing well. Improving tenderness to palpation of the medial knee surrounding the incision. Full ankle ROM. Knee ROM improving, about 10 degree flexion contracture. Motor function and sensation intact distally. Neurovascularly intact.   Imaging: Stable post op imaging. CT scan of right knee shows some bridging callus formation posteriorly on the sagittal views. The reduction remains in appropriate alignment  Labs:  No results found for this or any previous visit (from the past 24 hour(s)).  Assessment: 48 year old male with wound infection following ORIF right tibial plateau 01/20/2019  Injuries: 1. Right bicondylar tibial plateau fracture s/p ORIF 01/20/19 with removal of one medial screw 04/17/19 2. Right leg postoperative infection s/p I&D right tibia wound/infection 04/17/19 3. Right knee septic arthritis s/p I&D 04/17/19   Weightbearing: Partial WB RLE  Insicional and dressing care: Dressing can be changed as needed  Orthopedic device(s): Knee immobilizer RLE  Okay to not wear knee immobilizer when working with PT, okay to work on gentle ROM. If any drainage will have to decrease the motion   CV/Blood loss: Hgb stable. Hemodynamically stable  Pain management:  1. Tylenol 650 mg q 6 hours scheduled 2. Robaxin 750 mg q 8 hours PRN 3. Oxycodone 20 mg q 4 hours  PRN 4. Neurontin 300 mg TID 5. Dilaudid 1 mg q 6 hours PRN  Plan to continue to wean pain meds with decrease in oral oxycodone to 15 mg tomorrow.  VTE prophylaxis: Aspirin 325 mg daily  ID: Per ID  Dispo: Patient will remain in hospital for IV ABX x 3 weeks, followed by transition to outpatient PO ABX thereafter. Will continue with physcial therapy to improve his outcome for when he discharges.   Follow - up plan: TBD   Sarah A. Carmie [facility] ?[phone]? (phone)

## 2019-04-28 NOTE — Progress Notes (Signed)
Nutrition Brief Note  RD working remotely.  RD pulled to chart secondary to LOS.  Wt Readings from Last 15 Encounters:  04/17/19 97.5 kg  01/16/19 99.8 kg  02/04/18 95.3 kg  06/27/17 102.1 kg  10/31/16 106.6 kg  01/27/16 93.9 kg  06/23/15 92.5 kg  01/13/15 102.1 kg  06/15/14 98.6 kg  01/28/14 108.4 kg  05/11/13 98.4 kg  02/20/13 93 kg  12/30/12 90.4 kg  09/15/12 89.4 kg  03/10/12 19.82 kg   48 year old male with wound infection following ORIF right tibial plateau 01/20/2019  Pt s/p Right bicondylar tibial plateau fracture s/p ORIF 01/20/19 with removal of one medial screw 04/17/19; Right leg postoperative infection s/p I&D right tibia wound/infection 04/17/19; Right knee septic arthritis s/p I&D 04/17/19.   Pt will remain in hospital for 3 weeks to complete round of IV antibiotics due to suspect IVDA.  Body mass index is 26.87 kg/m. Patient meets criteria for overweight based on current BMI.   Current diet order is regular, patient is consuming approximately 85-100% of meals at this time. Labs and medications reviewed.   No nutrition interventions warranted at this time. If nutrition issues arise, please consult RD.   Remingtyn Depaola A. Jimmye Norman, RD, LDN, [place] Registered Dietitian II Certified Diabetes Care and Education Specialist Pager: [phone] After hours Pager: [phone]
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
## 2019-04-29 DIAGNOSIS — Z967 Presence of other bone and tendon implants: Secondary | ICD-10-CM

## 2019-04-29 DIAGNOSIS — S82141G Displaced bicondylar fracture of right tibia, subsequent encounter for closed fracture with delayed healing: Secondary | ICD-10-CM

## 2019-04-29 DIAGNOSIS — T847XXA Infection and inflammatory reaction due to other internal orthopedic prosthetic devices, implants and grafts, initial encounter: Secondary | ICD-10-CM

## 2019-04-29 DIAGNOSIS — Z5181 Encounter for therapeutic drug level monitoring: Secondary | ICD-10-CM

## 2019-04-29 LAB — BASIC METABOLIC PANEL
Anion gap: 14 (ref 5–15)
BUN: 13 mg/dL (ref 6–20)
CO2: 24 mmol/L (ref 22–32)
Calcium: 9.1 mg/dL (ref 8.9–10.3)
Chloride: 101 mmol/L (ref 98–111)
Creatinine, Ser: 0.79 mg/dL (ref 0.61–1.24)
GFR calc Af Amer: >60 mL/min (ref >60)
GFR calc non Af Amer: >60 mL/min (ref >60)
Glucose, Bld: 90 mg/dL (ref 70–99)
Potassium: 4.2 mmol/L (ref 3.5–5.1)
Sodium: 139 mmol/L (ref 135–145)

## 2019-04-29 MED ORDER — OXYCODONE HCL 5 MG PO TABS
15.0000 mg | ORAL_TABLET | ORAL | Status: DC | PRN
  Administered 2019-04-29 – 2019-05-04 (×28): 15 mg via ORAL
  Filled 2019-04-29 (×28): qty 3

## 2019-04-29 NOTE — Progress Notes (Signed)
Pharmacy Antibiotic Note  Evan [name] is a 48 y.o. male admitted on 04/17/2019 with right knee septic arthritis s/p hardware removal and I&D on 4/24.  Patient is being treated with IV Vancomycin, IV Ceftriaxone, and PO Rifampin for 3 weeks, then transitioning to oral antibiotics. Pharmacy has been consulted for vancomycin dosing.   Plan: Continue Vancomycin 1g IV q12h for now Check Vancomycin peak/trough levels with next dose Continue Ceftriaxone 2g IV 24h per ID Continue Rifampin 300mg  PO q12h per ID Monitor renal function, cultures, clinical course  Height: 6\' 3"  (190.5 cm) Weight: 215 lb (97.5 kg) IBW/kg (Calculated) : 84.5  Temp (24hrs), Avg:98.3 F (36.8 C), Min:98.1 F (36.7 C), Max:98.5 F (36.9 C)  Recent Labs  Lab 04/23/19 0410 04/25/19 2226 04/26/19 0417 04/26/19 0820 04/29/19 0412  CREATININE 0.87  --  0.75  --  0.79  VANCOTROUGH  --   --   --  15  --   VANCOPEAK  --  38  --   --   --     Estimated Creatinine Clearance: 139.4 mL/min (by C-G formula based on SCr of 0.79 mg/dL).    Allergies  Allergen Reactions  . Contrast Media [Iodinated Diagnostic Agents] Shortness Of Breath and Swelling    Shortness of breath., throat swelling  . Fish Allergy Anaphylaxis    No reaction to shellfish   Antimicrobials this admission: vancomycin 4/24 >>  ceftriaxone 4/24 >>  rifampin 4/24 >>   Microbiology results: 4/24 right knee medial: NGF 4/24 right synovial tissue: NGF 4/24 right knee: NGF  Thank you for allowing pharmacy to be a part of this patient's care.   Lindell Spar, PharmD, BCPS Pager: [phone] 04/29/2019 5:37 PM

## 2019-04-29 NOTE — Progress Notes (Signed)
Orthopaedic Trauma Progress Note  S: Patient doing well this morning, in better spirits than previous days. Pain has been pretty well controlled over the past couple days. Pain better at rest, increases with mobilization. Denies any drainage from incisions. He has been working on trying to get his knee to full extension while laying in bed and while sitting on edge of bed. He hopes to go outside with PT today. Discussed plan for decreasing his oral pain medication today, he is in agreement with this.   O:  Vitals:   04/28/19 1932 04/29/19 0511  BP: (!) 126/100 134/88  Pulse: 86 87  Resp: 19 16  Temp: 98.5 F (36.9 C) 98.1 F (36.7 C)  SpO2: 100% 99%    General - Sitting up in bed, NAD, pleasant and cooperative  Right Lower Extremity - Swelling in knee and lower leg continuing to improve. Incisions clean, dry, intact. Healing well. Improving tenderness to palpation of the medial knee surrounding the incision. Full ankle ROM. Knee ROM improving, about 10 degree flexion contracture. Motor function and sensation intact distally. Neurovascularly intact.   Imaging: Stable post op imaging. CT scan of right knee shows some bridging callus formation posteriorly on the sagittal views. The reduction remains in appropriate alignment  Labs:  Results for orders placed or performed during the hospital encounter of 04/17/19 (from the past 24 hour(s))  Basic metabolic panel     Status: None   Collection Time: 04/29/19  4:12 AM  Result Value Ref Range   Sodium 139 135 - 145 mmol/L   Potassium 4.2 3.5 - 5.1 mmol/L   Chloride 101 98 - 111 mmol/L   CO2 24 22 - 32 mmol/L   Glucose, Bld 90 70 - 99 mg/dL   BUN 13 6 - 20 mg/dL   Creatinine, Ser 0.79 0.61 - 1.24 mg/dL   Calcium 9.1 8.9 - 10.3 mg/dL   GFR calc non Af Amer >60 >60 mL/min   GFR calc Af Amer >60 >60 mL/min   Anion gap 14 5 - 15    Assessment: 48 year old male with wound infection following ORIF right tibial plateau 01/20/2019  Injuries:  1. Right bicondylar tibial plateau fracture s/p ORIF 01/20/19 with removal of one medial screw 04/17/19 2. Right leg postoperative infection s/p I&D right tibia wound/infection 04/17/19 3. Right knee septic arthritis s/p I&D 04/17/19   Weightbearing: Partial WB RLE  Insicional and dressing care: Dressing can be changed as needed  Orthopedic device(s): Knee immobilizer RLE  Okay to not wear knee immobilizer when working with PT, okay to work on gentle ROM. If any drainage will have to decrease the motion   CV/Blood loss: Hgb stable. Hemodynamically stable  Pain management:  1. Tylenol 650 mg q 6 hours scheduled 2. Robaxin 750 mg q 8 hours PRN 3. Oxycodone 15 mg q 4 hours PRN 4. Neurontin 300 mg TID 5. Dilaudid 1 mg q 6 hours PRN  Plan to continue to wean pain meds with decrease in oral oxycodone to 15 mg today.  VTE prophylaxis: Aspirin 325 mg daily  ID: Per ID  Dispo: Patient will remain in hospital for IV ABX x 3 weeks, followed by transition to outpatient PO ABX thereafter. Will continue with physcial therapy to improve his outcome for when he discharges.   Follow - up plan: TBD   Rayn Enderson A. [facility] ?[phone]? (phone)
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
## 2019-04-30 LAB — VANCOMYCIN, TROUGH: Vancomycin Tr: 14 ug/mL — ABNORMAL LOW (ref 15–20)

## 2019-04-30 LAB — VANCOMYCIN, PEAK: Vancomycin Pk: 28 ug/mL — ABNORMAL LOW (ref 30–40)

## 2019-04-30 NOTE — Progress Notes (Signed)
DBIV consult: Confirmed with lab vanc trough was received. Cancel consult.

## 2019-04-30 NOTE — Plan of Care (Signed)
  Problem: Clinical Measurements: Goal: Ability to maintain clinical measurements within normal limits will improve Outcome: Progressing   Problem: Pain Managment: Goal: General experience of comfort will improve Outcome: Progressing   

## 2019-04-30 NOTE — Progress Notes (Signed)
Orthopaedic Trauma Progress Note  S: Patient doing well this morning. Pain continues to be well controlled with decreases in pain medications. Pain better at rest, increases with mobilization. Denies any drainage from incisions. No new concerns. Discussed plan for decreasing his IV pain medication tomorrow, he is in agreement with this.   O:  Vitals:   04/29/19 1920 04/30/19 0436  BP: (!) 145/90 124/80  Pulse: 95 81  Resp: 17 17  Temp: 99.1 F (37.3 C) 97.6 F (36.4 C)  SpO2: 100% 100%    General - Laying in bed, NAD  Right Lower Extremity - Swelling in knee and lower leg continuing to improve. Incisions clean, dry, intact. Improving tenderness to palpation of the medial knee surrounding the incision. Full ankle ROM. Knee ROM improving, about 10 degree flexion contracture. Motor function and sensation intact distally. Neurovascularly intact.   Imaging: Stable post op imaging. CT scan of right knee shows some bridging callus formation posteriorly on the sagittal views. The reduction remains in appropriate alignment  Labs:  Results for orders placed or performed during the hospital encounter of 04/17/19 (from the past 24 hour(s))  Vancomycin, peak     Status: Abnormal   Collection Time: 04/29/19 11:47 PM  Result Value Ref Range   Vancomycin Pk 28 (L) 30 - 40 ug/mL    Assessment: 49 year old male with wound infection following ORIF right tibial plateau 01/20/2019  Injuries: 1. Right bicondylar tibial plateau fracture s/p ORIF 01/20/19 with removal of one medial screw 04/17/19 2. Right leg postoperative infection s/p I&D right tibia wound/infection 04/17/19 3. Right knee septic arthritis s/p I&D 04/17/19   Weightbearing: Partial WB RLE  Insicional and dressing care: Incisions can be left open to air, apply dressing as needed  Orthopedic device(s): Knee immobilizer RLE  Okay to not wear knee immobilizer when working with PT, okay to work on gentle ROM. If any drainage will have to  decrease the motion   CV/Blood loss: Hgb stable. Hemodynamically stable  Pain management:  1. Tylenol 650 mg q 6 hours scheduled 2. Robaxin 750 mg q 8 hours PRN 3. Oxycodone 15 mg q 4 hours PRN 4. Neurontin 300 mg TID 5. Dilaudid 1 mg q 6 hours PRN  Plan to continue to wean pain meds with decrease in frequency of IV dilaudid tomorrow.    VTE prophylaxis: Aspirin 325 mg daily  ID: Per ID  Dispo: Patient will remain in hospital for IV ABX x 3 weeks, followed by transition to outpatient PO ABX thereafter. Will continue with physcial therapy to improve his outcome for when he discharges.   Follow - up plan: TBD   [name] ?[phone]? (phone)

## 2019-04-30 NOTE — Progress Notes (Signed)
Pharmacy Antibiotic Note  Evan [name] is a 48 y.o. male admitted on 04/17/2019 with right knee septic arthritis s/p hardware removal and I&D on 4/24.  Patient is being treated with IV Vancomycin, IV Ceftriaxone, and PO Rifampin for 3 weeks, then transitioning to oral antibiotics. Pharmacy has been consulted for vancomycin dosing.  Vancomycin peak = 28 mcg/mL, Vancomycin trough = 14 mcg/mL, AUC = 556, just slightly above goal (goal 400-550)   Plan: Continue Vancomycin 1g IV q12h for now Plan to recheck peak/trough levels ~ 05/04/2019 Continue Ceftriaxone 2g IV 24h per ID Continue Rifampin 300mg  PO q12h per ID Monitor renal function, clinical course  Height: 6\' 3"  (190.5 cm) Weight: 215 lb (97.5 kg) IBW/kg (Calculated) : 84.5  Temp (24hrs), Avg:98.2 F (36.8 C), Min:97.6 F (36.4 C), Max:99.1 F (37.3 C)  Recent Labs  Lab 04/25/19 2226 04/26/19 0417 04/26/19 0820 04/29/19 0412 04/29/19 2347 04/30/19 0730  CREATININE  --  0.75  --  0.79  --   --   VANCOTROUGH  --   --  15  --   --  14*  VANCOPEAK 38  --   --   --  28*  --     Estimated Creatinine Clearance: 139.4 mL/min (by C-G formula based on SCr of 0.79 mg/dL).    Allergies  Allergen Reactions  . Contrast Media [Iodinated Diagnostic Agents] Shortness Of Breath and Swelling    Shortness of breath., throat swelling  . Fish Allergy Anaphylaxis    No reaction to shellfish   Antimicrobials this admission: vancomycin 4/24 >>  ceftriaxone 4/24 >>  rifampin 4/24 >>   Microbiology results: 4/24 right knee medial: NGF 4/24 right synovial tissue: NGF 4/24 right knee: NGF  Thank you for allowing pharmacy to be a part of this patient's care.   Lindell Spar, PharmD, BCPS Pager: [phone] 04/30/2019 2:58 PM
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
## 2019-05-01 MED ORDER — HYDROMORPHONE HCL 1 MG/ML IJ SOLN
1.0000 mg | Freq: Three times a day (TID) | INTRAMUSCULAR | Status: DC | PRN
  Administered 2019-05-01 – 2019-05-06 (×15): 1 mg via INTRAVENOUS
  Filled 2019-05-01 (×15): qty 1

## 2019-05-01 MED ORDER — HYDROMORPHONE HCL 1 MG/ML IJ SOLN: 1.0000 mg | Freq: Two times a day (BID) | INTRAMUSCULAR | Status: DC | PRN

## 2019-05-01 NOTE — Progress Notes (Signed)
Orthopaedic Trauma Progress Note  S: Patient doing okay this morning. Pain has been relatively well controlled with decreases in pain medications. Pain better at rest, increases with mobilization. Denies any drainage from incisions. No new concerns. Discussed plan for decreasing his IV pain medication today, he is in agreement to the plan.   O:  Vitals:   04/30/19 2140 05/01/19 0452  BP: (!) 141/91 124/87  Pulse: 89 87  Resp: 18 18  Temp: (!) 97.3 F (36.3 C) 97.6 F (36.4 C)  SpO2: 100% 100%    General - Laying in bed, NAD  Right Lower Extremity - Swelling in knee and lower leg continuing to improve. Incisions clean, dry, intact. Improving tenderness to palpation of the medial knee surrounding the incision. Full ankle ROM. Knee ROM improving, about 10 degree flexion contracture. Motor function and sensation intact distally. Neurovascularly intact.   Imaging: Stable post op imaging. CT scan of right knee shows some bridging callus formation posteriorly on the sagittal views. The reduction remains in appropriate alignment  Labs:  No results found for this or any previous visit (from the past 24 hour(s)).  Assessment: 48 year old male with wound infection following ORIF right tibial plateau 01/20/2019  Injuries: 1. Right bicondylar tibial plateau fracture s/p ORIF 01/20/19 with removal of one medial screw 04/17/19 2. Right leg postoperative infection s/p I&D right tibia wound/infection 04/17/19 3. Right knee septic arthritis s/p I&D 04/17/19   Weightbearing: Partial WB RLE  Insicional and dressing care: Incisions can be left open to air, apply dressing as needed  Orthopedic device(s): Knee immobilizer RLE  Okay to not wear knee immobilizer when working with PT, okay to work on gentle ROM. If any drainage will have to decrease the motion   CV/Blood loss: Hgb stable. Hemodynamically stable  Pain management:  1. Tylenol 650 mg q 6 hours scheduled 2. Robaxin 750 mg q 8 hours PRN 3.  Oxycodone 15 mg q 4 hours PRN 4. Neurontin 300 mg TID 5. Dilaudid 1 mg q 8 hours PRN  Plan to continue to wean pain meds with decrease in IV dilaudid to 1mg  every 8 hours today. Will decrease oral oxycodone to 10 mg on Monday.    VTE prophylaxis: Aspirin 325 mg daily  ID: Per ID  Dispo: Patient will remain in hospital for IV ABX x 3 weeks, followed by transition to outpatient PO ABX thereafter. Will continue with physcial therapy to improve his outcome for when he discharges.   Follow - up plan: TBD   Pranay Hilbun A. Carmie K[facility] ?[phone]? (phone)

## 2019-05-02 LAB — BASIC METABOLIC PANEL
Anion gap: 13 (ref 5–15)
BUN: 9 mg/dL (ref 6–20)
CO2: 25 mmol/L (ref 22–32)
Calcium: 9.4 mg/dL (ref 8.9–10.3)
Chloride: 99 mmol/L (ref 98–111)
Creatinine, Ser: 0.85 mg/dL (ref 0.61–1.24)
GFR calc Af Amer: >60 mL/min (ref >60)
GFR calc non Af Amer: >60 mL/min (ref >60)
Glucose, Bld: 102 mg/dL — ABNORMAL HIGH (ref 70–99)
Potassium: 4 mmol/L (ref 3.5–5.1)
Sodium: 137 mmol/L (ref 135–145)

## 2019-05-02 NOTE — Progress Notes (Signed)
Right knee sutures removed per order. Pt tolerated well.

## 2019-05-02 NOTE — Plan of Care (Signed)
  Problem: Activity: Goal: Risk for activity intolerance will decrease Outcome: Progressing   Problem: Coping: Goal: Level of anxiety will decrease Outcome: Progressing   Problem: Pain Managment: Goal: General experience of comfort will improve Outcome: Progressing   

## 2019-05-02 NOTE — Progress Notes (Signed)
Orthopedic Trauma Service Progress Note  Patient ID: Evan [name] MRN: [id number] DOB/AGE: 1971-05-26 48 y.o.  Subjective:  Doing fine  No complaints  He is taking pain meds at every allowable interval   No other acute issues of note    Review of Systems  Respiratory: Negative for shortness of breath and wheezing.   Cardiovascular: Negative for chest pain and palpitations.  Gastrointestinal: Negative for nausea and vomiting.    Objective:   VITALS:   Vitals:   05/01/19 0452 05/01/19 1408 05/01/19 2119 05/02/19 0510  BP: 124/87 137/90 133/84 120/85  Pulse: 87 91 83 85  Resp: 18 19  16   Temp: 97.6 F (36.4 C) 98.4 F (36.9 C) 98.9 F (37.2 C) 98 F (36.7 C)  TempSrc: Oral Oral Oral Oral  SpO2: 100% 100% 99% 100%  Weight:      Height:        Estimated body mass index is 26.87 kg/m as calculated from the following:   Height as of this encounter: 6\' 3"  (1.905 m).   Weight as of this encounter: 97.5 kg.   Intake/Output      05/08 0701 - 05/09 0700 05/09 0701 - 05/10 0700   P.O. 1260 480   Total Intake(mL/kg) 1260 (12.9) 480 (4.9)   Urine (mL/kg/hr) 3150 (1.3)    Stool 0    Total Output 3150    Net -1890 +480        Stool Occurrence 1 x      LABS  Results for orders placed or performed during the hospital encounter of 04/17/19 (from the past 24 hour(s))  Basic metabolic panel     Status: Abnormal   Collection Time: 05/02/19  4:27 AM  Result Value Ref Range   Sodium 137 135 - 145 mmol/L   Potassium 4.0 3.5 - 5.1 mmol/L   Chloride 99 98 - 111 mmol/L   CO2 25 22 - 32 mmol/L   Glucose, Bld 102 (H) 70 - 99 mg/dL   BUN 9 6 - 20 mg/dL   Creatinine, Ser 0.85 0.61 - 1.24 mg/dL   Calcium 9.4 8.9 - 10.3 mg/dL   GFR calc non Af Amer >60 >60 mL/min   GFR calc Af Amer >60 >60 mL/min   Anion gap 13 5 - 15     PHYSICAL EXAM:   Gen: sitting up in bed, NAD, watching TV  Lungs:  breathing unlabored Cardiac: regular  Ext:       Right Lower Extremity   Wounds well healed  Sutures ready to be removed  Ext warm   Distal motor and sensory functions intact  Swelling controlled   Calf soft   Can get to within about 10-15 degrees of full extension    Was resting with his knee in about 90 degrees of flexion when I entered the room   + DP pulse   Assessment/Plan: 15 Days Post-Op   Principal Problem:   Septic arthritis of knee, right (HCC) Active Problems:   Hardware complicating wound infection ([place])   Hepatitis C virus infection cured after antiviral drug therapy   Fracture of right tibial plateau   IV drug abuse ([place])   Anti-infectives (From admission, onward)   Start     Dose/Rate Route Frequency Ordered Stop   04/26/19 2000  vancomycin (  VANCOCIN) IVPB 1000 mg/200 mL premix     1,000 mg 200 mL/hr over 60 Minutes Intravenous Every 12 hours 04/26/19 1058 05/08/19 2359   04/19/19 2000  vancomycin (VANCOCIN) 1,250 mg in sodium chloride 0.9 % 250 mL IVPB  Status:  Discontinued     1,250 mg 166.7 mL/hr over 90 Minutes Intravenous Every 12 hours 04/19/19 1945 04/26/19 1058   04/18/19 1700  cefTRIAXone (ROCEPHIN) 2 g in sodium chloride 0.9 % 100 mL IVPB     2 g 200 mL/hr over 30 Minutes Intravenous Every 24 hours 04/17/19 1709 05/08/19 2359   04/17/19 2200  rifampin (RIFADIN) capsule 300 mg     300 mg Oral Every 12 hours 04/17/19 1727 05/08/19 2359   04/17/19 1800  vancomycin (VANCOCIN) 2,000 mg in sodium chloride 0.9 % 500 mL IVPB  Status:  Discontinued     2,000 mg 250 mL/hr over 120 Minutes Intravenous Every 12 hours 04/17/19 1523 04/19/19 1945   04/17/19 1730  cefTRIAXone (ROCEPHIN) 2 g in sodium chloride 0.9 % 100 mL IVPB  Status:  Discontinued     2 g 200 mL/hr over 30 Minutes Intravenous Every 24 hours 04/17/19 1648 04/17/19 1709   04/17/19 1600  cefTRIAXone (ROCEPHIN) 2 g in sodium chloride 0.9 % 100 mL IVPB  Status:  Discontinued     2 g 200 mL/hr  over 30 Minutes Intravenous Every 24 hours 04/17/19 1444 04/17/19 1648   04/17/19 1038  tobramycin (NEBCIN) powder  Status:  Discontinued       As needed 04/17/19 1038 04/17/19 1131   04/17/19 1038  vancomycin (VANCOCIN) powder  Status:  Discontinued       As needed 04/17/19 1038 04/17/19 1131    .  POD/HD#: 52  48 y/o male s/p ORIF R bicondylar tibial plateau fracture 01/20/2019, s/p I&D R knee for septic arthritis 04/17/2019 and removal of hardware x 1 screw  - R bicondylar tibial plateau fracture s/p ORIF 01/20/2019   R leg postop infection s/p I&D R tibia wound 04/17/2019   R knee septic arthritis s/p I&D 04/17/2019   Partial WB R LEx  Wounds open to air   Ok to clean with soap and water only    Knee immobilizer can be off when working with PT, gentle ROM   - Pain management:  Working on wean   Seems comfortable currently   Plan to decrease to oxy 10 mg on Monday   - ABL anemia/Hemodynamics  Stable  - Medical issues   Home meds   - DVT/PE prophylaxis:  Aspirin 325 mg daily   - ID:   Per ID    - FEN/GI prophylaxis/Foley/Lines:  Reg diet  - Impediments to fracture healing:  Chronic medical issues  ? Active PSA history   Infection/septic arthritis   - Dispo:  Continue with inpatient care  IV abx through 05/08/2019 per ID then transition to PO abx   Likely keep 24-48 hours after switch to monitor?    Jari Pigg, PA-C [phone] (C) 05/02/2019, 10:55 AM  [facility] [place] [address] [phone] (F)

## 2019-05-03 NOTE — Progress Notes (Signed)
Orthopedic Tech Progress Note Patient Details:  Evan [name] 06/04/1971 [id number]  Ortho Devices Type of Ortho Device: Bone foam zero knee Ortho Device/Splint Location: LRE Ortho Device/Splint Interventions: Ordered, Application, Adjustment   Post Interventions Patient Tolerated: Well Instructions Provided: Care of device, Adjustment of device   [name] 05/03/2019, 11:32 AM

## 2019-05-03 NOTE — Progress Notes (Signed)
Pharmacy Antibiotic Note  Evan [name] is a 48 y.o. male admitted on 04/17/2019 with right knee septic arthritis s/p hardware removal and I&D on 4/24.  Patient is being treated with IV Vancomycin, IV Ceftriaxone, and PO Rifampin for 3 weeks, then transitioning to oral antibiotics. Pharmacy has been consulted for vancomycin dosing.  Plan: Continue Vancomycin 1g IV q12h for now (most recent AUC just slightly above goal range, renal function stable/WNL) Plan to recheck Vancomycin peak/trough levels in next couple days  Continue Ceftriaxone 2g IV 24h per ID Continue Rifampin 300mg  PO q12h per ID Monitor renal function, clinical course  Height: 6\' 3"  (190.5 cm) Weight: 215 lb (97.5 kg) IBW/kg (Calculated) : 84.5  Temp (24hrs), Avg:98.5 F (36.9 C), Min:97.5 F (36.4 C), Max:99.3 F (37.4 C)  Recent Labs  Lab 04/29/19 0412 04/29/19 2347 04/30/19 0730 05/02/19 0427  CREATININE 0.79  --   --  0.85  VANCOTROUGH  --   --  14*  --   VANCOPEAK  --  28*  --   --     Estimated Creatinine Clearance: 131.2 mL/min (by C-G formula based on SCr of 0.85 mg/dL).    Allergies  Allergen Reactions  . Contrast Media [Iodinated Diagnostic Agents] Shortness Of Breath and Swelling    Shortness of breath., throat swelling  . Fish Allergy Anaphylaxis    No reaction to shellfish   Antimicrobials this admission: vancomycin 4/24 >>  ceftriaxone 4/24 >>  rifampin 4/24 >>   Microbiology results: 4/24 right knee medial: NGF 4/24 right synovial tissue: NGF 4/24 right knee: NGF  Thank you for allowing pharmacy to be a part of this patient's care.   Lindell Spar, PharmD, BCPS Pager: [phone] 05/03/2019 10:41 AM

## 2019-05-03 NOTE — Progress Notes (Signed)
Orthopaedic Trauma Service (OTS)  16 Days Post-Op Procedure(s) (LRB): IRRIGATION AND DEBRIDEMENT RIGHT KNEE (Right) HARDWARE REMOVAL RIGHT TIBIA (Right)  Subjective: Patient reports pain as mild and difficulty getting knee extended.    Objective: Current Vitals Blood pressure (!) 136/92, pulse 92, temperature (!) 97.5 F (36.4 C), temperature source Oral, resp. rate 14, height 6\' 3"  (1.905 m), weight 97.5 kg, SpO2 98 %. Vital signs in last 24 hours: Temp:  [97.5 F (36.4 C)-99.3 F (37.4 C)] 97.5 F (36.4 C) (05/10 0453) Pulse Rate:  [78-97] 92 (05/10 0453) Resp:  [14-16] 14 (05/10 0453) BP: (133-136)/(92-96) 136/92 (05/10 0453) SpO2:  [98 %-100 %] 98 % (05/10 0453)  Intake/Output from previous day: 05/09 0701 - 05/10 0700 In: 1400 [P.O.:1200; IV Piggyback:200] Out: 3750 [Urine:3750]  LABS No results for input(s): HGB in the last 72 hours. No results for input(s): WBC, RBC, HCT, PLT in the last 72 hours. Recent Labs    05/02/19 0427  NA 137  K 4.0  CL 99  CO2 25  BUN 9  CREATININE 0.85  GLUCOSE 102*  CALCIUM 9.4   No results for input(s): LABPT, INR in the last 72 hours.   Physical Exam RLE No dressing; wounds intact, clean, dry  Edema/ swelling controlled but large effusion  Sens: DPN, SPN, TN intact  Motor: EHL, FHL, and lessor toe ext and flex all intact grossly  Brisk cap refill, warm to touch  Lacks full extension by 15 degrees  Assessment/Plan: 16 Days Post-Op Procedure(s) (LRB): IRRIGATION AND DEBRIDEMENT RIGHT KNEE (Right) HARDWARE REMOVAL RIGHT TIBIA (Right) 1. PT/OT; add zero knee to assist with gaining extension; cold compression may help also 2. DVT proph Other (comment) ECASA 3. Cont abx  Altamese Tutwiler, MD [facility] [phone] (p)

## 2019-05-04 MED ORDER — OXYCODONE HCL 5 MG PO TABS
10.0000 mg | ORAL_TABLET | ORAL | Status: DC | PRN
  Administered 2019-05-04 – 2019-05-09 (×29): 10 mg via ORAL
  Filled 2019-05-04 (×28): qty 2

## 2019-05-04 MED ORDER — DOCUSATE SODIUM 100 MG PO CAPS
100.0000 mg | ORAL_CAPSULE | Freq: Every day | ORAL | Status: DC
  Filled 2019-05-04 (×5): qty 1

## 2019-05-04 MED ORDER — OXYCODONE HCL 5 MG PO TABS
10.0000 mg | ORAL_TABLET | Freq: Every day | ORAL | Status: DC | PRN
  Administered 2019-05-04 – 2019-05-09 (×6): 10 mg via ORAL
  Filled 2019-05-04 (×8): qty 2

## 2019-05-04 MED ORDER — OXYCODONE HCL 5 MG PO TABS
15.0000 mg | ORAL_TABLET | ORAL | Status: DC | PRN
  Administered 2019-05-04: 15 mg via ORAL
  Filled 2019-05-04: qty 3

## 2019-05-04 MED ORDER — OXYCODONE HCL 5 MG PO TABS: 10.0000 mg | ORAL_TABLET | Freq: Every day | ORAL | Status: DC | PRN

## 2019-05-04 MED ORDER — OXYCODONE HCL 5 MG PO TABS: 10.0000 mg | ORAL_TABLET | ORAL | Status: DC | PRN

## 2019-05-04 NOTE — Progress Notes (Signed)
Patrecia Pace notified of the Following vital signs Will continue to monitor patient   05/04/19 1521  Vitals  Temp 99.2 F (37.3 C)  Temp Source Oral  BP (!) 148/102  MAP (mmHg) 118  BP Location Right Arm  BP Method Automatic  Patient Position (if appropriate) Sitting  Pulse Rate (!) 123  Pulse Rate Source Monitor  Resp 20  Oxygen Therapy  SpO2 100 %  O2 Device Room Air  MEWS Score  MEWS RR 0  MEWS Pulse 2  MEWS Systolic 0  MEWS LOC 0  MEWS Temp 0  MEWS Score 2  MEWS Score Color Yellow

## 2019-05-04 NOTE — Progress Notes (Addendum)
Orthopaedic Trauma Progress Note  S: Patient doing okay this morning. Pain has been relatively well controlled with decreases in pain medications. Pain better at rest, continues to be worse with mobilization. Pain located over front of knee. Sutures removed over the weekend, denies any drainage from incisions. Continues to have hard time getting knee fully extended. Zero knee bone foam ordered yesterday, but states he cannot tolerate this because it sends shooting pains across front of knee. Right knee feels warm to touch but he denies fever or chills. Discussed plan for decreasing oral pain medication today, he is in agreement to the plan.   O:  Vitals:   05/03/19 2032 05/04/19 0439  BP: (!) 143/93 (!) 136/97  Pulse: 94 (!) 102  Resp: 18 16  Temp: 99 F (37.2 C) 98.2 F (36.8 C)  SpO2: 100% 100%    General - Laying in bed, NAD  Right Lower Extremity - Continues to have knee effusion, but overall swelling in knee and lower leg continuing to improve. Knee feels warm to touch. Incisions clean, dry, intact. Continues to have tenderness over anterior aspect of knee. Full ankle ROM. Unable to full extend knee, about 10-15 degree flexion contracture. Motor function and sensation intact distally. Neurovascularly intact.   Imaging: Stable post op imaging. CT scan of right knee shows some bridging callus formation posteriorly on the sagittal views. The reduction remains in appropriate alignment  Labs:  No results found for this or any previous visit (from the past 24 hour(s)).  Assessment: 48 year old male with wound infection following ORIF right tibial plateau 01/20/2019  Injuries: 1. Right bicondylar tibial plateau fracture s/p ORIF 01/20/19 with removal of one medial screw 04/17/19 2. Right leg postoperative infection s/p I&D right tibia wound/infection 04/17/19 3. Right knee septic arthritis s/p I&D 04/17/19   Weightbearing: Partial WB RLE  Insicional and dressing care: Incisions can be left  open to air, apply dressing as needed  Orthopedic device(s): Knee immobilizer RLE, zero knee bone foam  Okay to not wear knee immobilizer when working with PT, okay to work on gentle ROM. If any drainage will have to decrease the motion   CV/Blood loss: Hgb stable. Hemodynamically stable  Pain management:  1. Tylenol 650 mg q 6 hours scheduled 2. Robaxin 750 mg q 8 hours PRN 3. Oxycodone 15 mg q 4 hours PRN 4. Neurontin 300 mg TID 5. Dilaudid 1 mg q 8 hours PRN  Plan to continue to wean pain meds with decrease in oral oxycodone to 10 mg today.   VTE prophylaxis: Aspirin 325 mg daily  ID: Per ID  Dispo: Patient will remain in hospital for IV ABX through 05/08/2019 per ID then transition to PO abx. Likely keep 24-48 hours after switch to monitor. Will continue with physcial therapy to improve his outcome for when he discharges.   Follow - up plan: TBD   Furkan Keenum A. [facility] ?[phone]? (phone)
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
## 2019-05-05 DIAGNOSIS — T84622A Infection and inflammatory reaction due to internal fixation device of right tibia, initial encounter: Principal | ICD-10-CM

## 2019-05-05 LAB — CBC WITH DIFFERENTIAL/PLATELET
Abs Immature Granulocytes: 0.03 10*3/uL (ref 0.00–0.07)
Basophils Absolute: 0 10*3/uL (ref 0.0–0.1)
Basophils Relative: 0 %
Eosinophils Absolute: 0 10*3/uL (ref 0.0–0.5)
Eosinophils Relative: 0 %
HCT: 34.5 % — ABNORMAL LOW (ref 39.0–52.0)
Hemoglobin: 10.2 g/dL — ABNORMAL LOW (ref 13.0–17.0)
Immature Granulocytes: 0 %
Lymphocytes Relative: 17 %
Lymphs Abs: 1.3 10*3/uL (ref 0.7–4.0)
MCH: 23.7 pg — ABNORMAL LOW (ref 26.0–34.0)
MCHC: 29.6 g/dL — ABNORMAL LOW (ref 30.0–36.0)
MCV: 80.2 fL (ref 80.0–100.0)
Monocytes Absolute: 0.5 10*3/uL (ref 0.1–1.0)
Monocytes Relative: 6 %
Neutro Abs: 6 10*3/uL (ref 1.7–7.7)
Neutrophils Relative %: 77 %
Platelets: 501 10*3/uL — ABNORMAL HIGH (ref 150–400)
RBC: 4.3 MIL/uL (ref 4.22–5.81)
RDW: 15.9 % — ABNORMAL HIGH (ref 11.5–15.5)
WBC: 7.8 10*3/uL (ref 4.0–10.5)
nRBC: 0 % (ref 0.0–0.2)

## 2019-05-05 LAB — BASIC METABOLIC PANEL
Anion gap: 17 — ABNORMAL HIGH (ref 5–15)
BUN: 12 mg/dL (ref 6–20)
CO2: 22 mmol/L (ref 22–32)
Calcium: 9 mg/dL (ref 8.9–10.3)
Chloride: 94 mmol/L — ABNORMAL LOW (ref 98–111)
Creatinine, Ser: 1.05 mg/dL (ref 0.61–1.24)
GFR calc Af Amer: >60 mL/min (ref >60)
GFR calc non Af Amer: >60 mL/min (ref >60)
Glucose, Bld: 119 mg/dL — ABNORMAL HIGH (ref 70–99)
Potassium: 4.2 mmol/L (ref 3.5–5.1)
Sodium: 133 mmol/L — ABNORMAL LOW (ref 135–145)

## 2019-05-05 LAB — C-REACTIVE PROTEIN: CRP: 11 mg/dL — ABNORMAL HIGH (ref <1.0)

## 2019-05-05 LAB — SEDIMENTATION RATE: Sed Rate: 59 mm/hr — ABNORMAL HIGH (ref 0–16)

## 2019-05-05 MED ORDER — CIPROFLOXACIN HCL 750 MG PO TABS: 750.0000 mg | ORAL_TABLET | Freq: Two times a day (BID) | ORAL | 0 refills | Status: DC

## 2019-05-05 MED ORDER — RIFAMPIN 300 MG PO CAPS: 300.0000 mg | ORAL_CAPSULE | Freq: Two times a day (BID) | ORAL | 0 refills | Status: AC

## 2019-05-05 MED ORDER — LINEZOLID 600 MG PO TABS: 600.0000 mg | ORAL_TABLET | Freq: Two times a day (BID) | ORAL | 0 refills | Status: DC

## 2019-05-05 MED ORDER — LEVOFLOXACIN 750 MG PO TABS: 750.0000 mg | ORAL_TABLET | Freq: Every day | ORAL | 0 refills | Status: AC

## 2019-05-05 MED FILL — LINEZOLID 600 MG TABS: 600 | 21 days supply | Qty: 42 | Fill #0

## 2019-05-05 MED FILL — rifAMPin 300 MG CAPS: 300 | 21 days supply | Qty: 42 | Fill #0

## 2019-05-05 MED FILL — levoFLOXacin 750 MG TABS: 750 | 21 days supply | Qty: 21 | Fill #0
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
## 2019-05-05 NOTE — TOC Progression Note (Signed)
Transition of Care [facility]) - Progression Note    Patient Details  Name: Evan [name] MRN: [id number] Date of Birth: [birth year]  Transition of [facility] CM/SW Contact  Jacalyn Lefevre Edson Snowball, RN Phone Number: 05/05/2019, 11:36 AM  Clinical Narrative:    Plan discharge is for Friday , May 08, 2019.   Prescriptions for Rifampin, Cipro, and Zyvox sent to [facility] , patient entered into [facility] system. TOC fill process prescriptions and hold until discharge day.   Has follow up appointment already  Expected Discharge Plan: Home/Self Care Barriers to Discharge: Active Substance Use with PICC Line  Expected Discharge Plan and Services Expected Discharge Plan: Home/Self Care In-house Referral: Financial Counselor Discharge Planning Services: CM Consult Post Acute Care Choice: [place] arrangements for the past 2 months: Single Family Home                   DME Agency: NA       HH Arranged: NA(patient to stay in hospital for IV ABX , PT updated recommendation : No PT follow up ) [place] Agency: NA Date HH Agency Contacted: 04/20/19 Time [place]: 1210 Representative spoke with at [place]: [place]) Interventions    Readmission Risk Interventions No flowsheet data found.
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
## 2019-05-06 ENCOUNTER — Inpatient Hospital Stay (HOSPITAL_COMMUNITY): Payer: Self-pay

## 2019-05-06 LAB — HEPATIC FUNCTION PANEL
ALT: 11 U/L (ref 0–44)
AST: 13 U/L — ABNORMAL LOW (ref 15–41)
Albumin: 2.4 g/dL — ABNORMAL LOW (ref 3.5–5.0)
Alkaline Phosphatase: 90 U/L (ref 38–126)
Bilirubin, Direct: <0.1 mg/dL (ref 0.0–0.2)
Total Bilirubin: 0.5 mg/dL (ref 0.3–1.2)
Total Protein: 6.8 g/dL (ref 6.5–8.1)

## 2019-05-06 LAB — VANCOMYCIN, TROUGH: Vancomycin Tr: 13 ug/mL — ABNORMAL LOW (ref 15–20)

## 2019-05-06 MED ORDER — HYDROMORPHONE HCL 1 MG/ML IJ SOLN
1.0000 mg | Freq: Two times a day (BID) | INTRAMUSCULAR | Status: DC | PRN
  Administered 2019-05-06 – 2019-05-09 (×6): 1 mg via INTRAVENOUS
  Filled 2019-05-06 (×6): qty 1

## 2019-05-06 MED ORDER — HYDROMORPHONE HCL 1 MG/ML IJ SOLN: 1.0000 mg | Freq: Two times a day (BID) | INTRAMUSCULAR | Status: DC | PRN

## 2019-05-06 MED ORDER — VITAMIN D 25 MCG (1000 UNIT) PO TABS
2000.0000 [IU] | ORAL_TABLET | Freq: Two times a day (BID) | ORAL | Status: DC
  Administered 2019-05-06 – 2019-05-09 (×7): 2000 [IU] via ORAL
  Filled 2019-05-06 (×7): qty 2

## 2019-05-06 NOTE — Progress Notes (Signed)
Pharmacy Antibiotic Note  Evan [name] is a 48 y.o. male admitted on 04/17/2019 with right knee septic arthritis s/p hardware removal and I&D on 4/24.  Patient is being treated with IV Vancomycin, IV Ceftriaxone, and PO Rifampin for 3 weeks, then transitioning to oral antibiotics. Pharmacy has been consulted for vancomycin dosing.  Plan: Continue Vancomycin 1g IV q12h, Trough this am 13 mcg/ml, Continue Ceftriaxone 2g IV 24h per ID Continue Rifampin 300mg  PO q12h per ID Monitor renal function, clinical course  Height: 6\' 3"  (190.5 cm) Weight: 215 lb (97.5 kg) IBW/kg (Calculated) : 84.5  Temp (24hrs), Avg:97.8 F (36.6 C), Min:97.8 F (36.6 C), Max:97.8 F (36.6 C)  Recent Labs  Lab 04/29/19 2347 04/30/19 0730 05/02/19 0427 05/05/19 0505 05/06/19 0651  WBC  --   --   --  7.8  --   CREATININE  --   --  0.85 1.05  --   VANCOTROUGH  --  14*  --   --  13*  VANCOPEAK 28*  --   --   --   --     Estimated Creatinine Clearance: 106.2 mL/min (by C-G formula based on SCr of 1.05 mg/dL).    Allergies  Allergen Reactions  . Contrast Media [Iodinated Diagnostic Agents] Shortness Of Breath and Swelling    Shortness of breath., throat swelling  . Fish Allergy Anaphylaxis    No reaction to shellfish   Antimicrobials this admission: vancomycin 4/24 >>  ceftriaxone 4/24 >>  rifampin 4/24 >>   Microbiology results: 4/24 right knee medial: NGF 4/24 right synovial tissue: NGF 4/24 right knee: NGF  Thank you for allowing pharmacy to be a part of this patient's care.  Minda Ditto PharmD [phone] till 3:30pm 05/03/2019 10:41 AM

## 2019-05-06 NOTE — Discharge Instructions (Signed)
Orthopaedic Trauma Service Discharge Instructions   General Discharge Instructions  WEIGHT BEARING STATUS: Partial weightbearing right lower extremity  RANGE OF MOTION/ACTIVITY: Unrestricted range of motion of right knee. Continue to work on getting knee to full extension  Wound Care: Incisions can be left open to air.  DVT/PE prophylaxis: Aspirin 325 mg for one more week.  Diet: as you were eating previously.  Can use over the counter stool softeners and bowel preparations, such as Miralax, to help with bowel movements.  Narcotics can be constipating.  Be sure to drink plenty of fluids  PAIN MEDICATION USE AND EXPECTATIONS  You have likely been given narcotic medications to help control your pain.  After a traumatic event that results in an fracture (broken bone) with or without surgery, it is ok to use narcotic pain medications to help control one's pain.  We understand that everyone responds to pain differently and each individual patient will be evaluated on a regular basis for the continued need for narcotic medications. Ideally, narcotic medication use should last no more than 6-8 weeks (coinciding with fracture healing).   As a patient it is your responsibility as well to monitor narcotic medication use and report the amount and frequency you use these medications when you come to your office visit.   We would also advise that if you are using narcotic medications, you should take a dose prior to therapy to maximize you participation.  IF YOU ARE ON NARCOTIC MEDICATIONS IT IS NOT PERMISSIBLE TO OPERATE A MOTOR VEHICLE (MOTORCYCLE/CAR/TRUCK/MOPED) OR HEAVY MACHINERY DO NOT MIX NARCOTICS WITH OTHER CNS (CENTRAL NERVOUS SYSTEM) DEPRESSANTS SUCH AS ALCOHOL   STOP SMOKING OR USING NICOTINE PRODUCTS!!!!  As discussed nicotine severely impairs your body's ability to heal surgical and traumatic wounds but also impairs bone healing.  Wounds and bone heal by forming microscopic blood  vessels (angiogenesis) and nicotine is a vasoconstrictor (essentially, shrinks blood vessels).  Therefore, if vasoconstriction occurs to these microscopic blood vessels they essentially disappear and are unable to deliver necessary nutrients to the healing tissue.  This is one modifiable factor that you can do to dramatically increase your chances of healing your injury.    (This means no smoking, no nicotine gum, patches, etc)  DO NOT USE NONSTEROIDAL ANTI-INFLAMMATORY DRUGS (NSAID'S)  Using products such as Advil (ibuprofen), Aleve (naproxen), Motrin (ibuprofen) for additional pain control during fracture healing can delay and/or prevent the healing response.  If you would like to take over the counter (OTC) medication, Tylenol (acetaminophen) is ok.  However, some narcotic medications that are given for pain control contain acetaminophen as well. Therefore, you should not exceed more than 4000 mg of tylenol in a day if you do not have liver disease.  Also note that there are may OTC medicines, such as cold medicines and allergy medicines that my contain tylenol as well.  If you have any questions about medications and/or interactions please ask your doctor/PA or your pharmacist.      ICE AND ELEVATE INJURED/OPERATIVE EXTREMITY  Using ice and elevating the injured extremity above your heart can help with swelling and pain control.  Icing in a pulsatile fashion, such as 20 minutes on and 20 minutes off, can be followed.    Do not place ice directly on skin. Make sure there is a barrier between to skin and the ice pack.    Using frozen items such as frozen peas works well as the conform nicely to the are that needs to  be iced.  USE AN ACE WRAP OR TED HOSE FOR SWELLING CONTROL  In addition to icing and elevation, Ace wraps or TED hose are used to help limit and resolve swelling.  It is recommended to use Ace wraps or TED hose until you are informed to stop.    When using Ace Wraps start the wrapping  distally (farthest away from the body) and wrap proximally (closer to the body)   Example: If you had surgery on your leg or thing and you do not have a splint on, start the ace wrap at the toes and work your way up to the thigh        If you had surgery on your upper extremity and do not have a splint on, start the ace wrap at your fingers and work your way up to the upper arm    [place]: [phone]   VISIT OUR WEBSITE FOR ADDITIONAL INFORMATION: [email or web address]     Discharge Wound Care Instructions  Do NOT apply any ointments, solutions or lotions to pin sites or surgical wounds.  These prevent needed drainage and even though solutions like hydrogen peroxide kill bacteria, they also damage cells lining the pin sites that help fight infection.  Applying lotions or ointments can keep the wounds moist and can cause them to breakdown and open up as well. This can increase the risk for infection. When in doubt call the office.  Surgical incisions should be dressed daily.  If any drainage is noted, use one layer of adaptic, then gauze, Kerlix, and an ace wrap.  Once the incision is completely dry and without drainage, it may be left open to air out.  Showering may begin 36-48 hours later.  Cleaning gently with soap and water.  Traumatic wounds should be dressed daily as well.    One layer of adaptic, gauze, Kerlix, then ace wrap.  The adaptic can be discontinued once the draining has ceased    If you have a wet to dry dressing: wet the gauze with saline the squeeze as much saline out so the gauze is moist (not soaking wet), place moistened gauze over wound, then place a dry gauze over the moist one, followed by Kerlix wrap, then ace wrap.

## 2019-05-06 NOTE — Progress Notes (Signed)
Orthopaedic Trauma Progress Note  S: Patient doing well this morning. Pain has been relatively well controlled with decreases in pain medications. Pain better at rest, continues to be worse with mobilization. Pain located over front of knee. Although his knee remains swollen, he does feel that it is gradually improving and is currently asymptomatic. Do not feel aspiration is needed at this time. Continues to have hard time getting knee fully extended. Has been unable to tolerate zero knee bone foam. He denies any fevers, chills, nausea, vomiting. Discussed decreasing IV pain medication today, he is in agreement to plan   O:  Vitals:   05/05/19 1247 05/06/19 0347  BP: 113/82 124/81  Pulse: 91 89  Resp:  14  Temp: 98.3 F (36.8 C) 97.8 F (36.6 C)  SpO2: 99% 99%    General - Sitting up in bed, NAD  Cardiac - Heart regular rate and rhythm Respiratory - No increased work of breathing, lungs CTA anterior lung fields bilaterally Right Lower Extremity - Knee effusion, but overall swelling in knee and lower leg continuing to improve. Incisions clean, dry, intact. Continues to have tenderness over anterior aspect of knee. Full ankle ROM. Unable to fully extend knee, about 10-15 degree flexion contracture. Motor function intact distally. Has some decreased sensation over anterior tibia. Neurovascularly intact.   Imaging: Follow up knee XR being performed this AM to evaluate for healing  Labs:  Results for orders placed or performed during the hospital encounter of 04/17/19 (from the past 24 hour(s))  Hepatic function panel     Status: Abnormal   Collection Time: 05/06/19  5:00 AM  Result Value Ref Range   Total Protein 6.8 6.5 - 8.1 g/dL   Albumin 2.4 (L) 3.5 - 5.0 g/dL   AST 13 (L) 15 - 41 U/L   ALT 11 0 - 44 U/L   Alkaline Phosphatase 90 38 - 126 U/L   Total Bilirubin 0.5 0.3 - 1.2 mg/dL   Bilirubin, Direct <0.1 0.0 - 0.2 mg/dL   Indirect Bilirubin NOT CALCULATED 0.3 - 0.9 mg/dL   Vancomycin, trough     Status: Abnormal   Collection Time: 05/06/19  6:51 AM  Result Value Ref Range   Vancomycin Tr 13 (L) 15 - 20 ug/mL    Assessment: 48 year old male with wound infection following ORIF right tibial plateau 01/20/2019  Injuries: 1. Right bicondylar tibial plateau fracture s/p ORIF 01/20/19 with removal of one medial screw 04/17/19 2. Right leg postoperative infection s/p I&D right tibia wound/infection 04/17/19 3. Right knee septic arthritis s/p I&D 04/17/19   Weightbearing: Partial WB RLE  Insicional and dressing care: Incisions can be left open to air, apply dressing as needed  Orthopedic device(s): Knee immobilizer RLE, zero knee bone foam  Okay to not wear knee immobilizer when working with PT, okay to work on gentle ROM. If any drainage will have to decrease the motion   CV/Blood loss: Hgb stable. Hemodynamically stable  Pain management:  1. Tylenol 650 mg q 6 hours scheduled 2. Robaxin 750 mg q 8 hours PRN 3. Oxycodone 15 mg q 4 hours PRN 4. Neurontin 300 mg TID 5. Dilaudid 1 mg q 8 hours PRN  Plan to continue to wean pain meds with decrease in IV dilaudid to q12h this afternoon.  VTE prophylaxis: Aspirin 325 mg daily  ID: Per ID  Dispo: Patient will remain in hospital for IV ABX through 05/08/2019 per ID then transition to PO abx.  Will continue with  physcial therapy to improve his outcome for when he discharges.   Follow - up plan: Ortho trauma F/U 2 weeks after discharge    He has a lab appointment on 5/21 @ [facility] with Catalina Antigua 6/2 @ 3:30 pm    Everette Dimauro A. [facility] ?[phone]? (phone)
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
## 2019-05-07 NOTE — Plan of Care (Signed)

## 2019-05-07 NOTE — Progress Notes (Addendum)
Orthopaedic Trauma Progress Note  S: Patient doing well this morning. Pain has been relatively well controlled with decreases in pain medications. Pain better at rest, continues to be worse with mobilization. Pain located over front of knee. Although his knee remains swollen, he does feel that it is gradually improving and is currently asymptomatic. Do not feel aspiration is needed at this time. Continues to have hard time getting knee fully extended. Has been unable to tolerate zero knee bone foam for more than a few minutes. He reports her had another episode of chills with associated spike in temp to 100.6 yesterday afternoon after decrease in IV dilaudid. Fever and chills have resolved this morning. He denies any fevers, chills, nausea, vomiting.   O:  Vitals:   05/06/19 2141 05/07/19 0415  BP:  114/82  Pulse: 88 86  Resp:  14  Temp: 99.5 F (37.5 C) 97.9 F (36.6 C)  SpO2:  100%    General - Sitting up in bed, NAD  Cardiac - Heart regular rate and rhythm Respiratory - No increased work of breathing, lungs CTA anterior lung fields bilaterally Right Lower Extremity - Knee effusion, but overall swelling in knee and lower leg continuing to improve. Incisions clean, dry, intact. Continues to have tenderness over anterior aspect of knee. Full ankle ROM. Unable to fully extend knee, about 10-15 degree flexion contracture. Motor function intact distally. Has some decreased sensation over anterior tibia. Neurovascularly intact.   Imaging: Follow up knee XR shows knee effusion present. All hardware remains in good position. No signs of failure or loosening. Alignment remains near anatomic with unchanged slight valgus angulation of the Tibia. Small amount of callus formation has occurred over fracture.   Labs:  No results found for this or any previous visit (from the past 24 hour(s)).  Assessment: 48 year old male with wound infection following ORIF right tibial plateau  01/20/2019  Injuries: 1. Right bicondylar tibial plateau fracture s/p ORIF 01/20/19 with removal of one medial screw 04/17/19 2. Right leg postoperative infection s/p I&D right tibia wound/infection 04/17/19 3. Right knee septic arthritis s/p I&D 04/17/19   Weightbearing: Partial WB RLE  Insicional and dressing care: Incisions can be left open to air, apply dressing as needed  Orthopedic device(s): Knee immobilizer RLE, zero knee bone foam  Okay to not wear knee immobilizer when working with PT, okay to work on gentle ROM. If any drainage will have to decrease the motion   CV/Blood loss: Hgb stable. Hemodynamically stable  Pain management:  1. Tylenol 650 mg q 6 hours scheduled 2. Robaxin 750 mg q 8 hours PRN 3. Oxycodone 15 mg q 4 hours PRN 4. Neurontin 300 mg TID 5. Dilaudid 1 mg q 12 hours PRN   VTE prophylaxis: Aspirin 325 mg daily  ID: Per ID  Dispo: Patient will remain in hospital for IV ABX through 05/08/2019 per ID then transition to PO abx starting 05/09/2019 and will likely discharge on that day.  Will continue with physcial therapy to improve his outcome for when he discharges.   Follow - up plan: Ortho trauma F/U with Dr. Doreatha Martin 6/2 @ 1:30 pm    He has a lab appointment on 5/21 @ [facility] F/U with Catalina Antigua 6/2 @ 3:30 pm    Caoilainn Sacks [facility] ?[phone]? (phone)

## 2019-05-08 LAB — BASIC METABOLIC PANEL
Anion gap: 7 (ref 5–15)
BUN: 12 mg/dL (ref 6–20)
CO2: 26 mmol/L (ref 22–32)
Calcium: 8.8 mg/dL — ABNORMAL LOW (ref 8.9–10.3)
Chloride: 104 mmol/L (ref 98–111)
Creatinine, Ser: 0.81 mg/dL (ref 0.61–1.24)
GFR calc Af Amer: >60 mL/min (ref >60)
GFR calc non Af Amer: >60 mL/min (ref >60)
Glucose, Bld: 102 mg/dL — ABNORMAL HIGH (ref 70–99)
Potassium: 4.1 mmol/L (ref 3.5–5.1)
Sodium: 137 mmol/L (ref 135–145)

## 2019-05-08 MED ORDER — GABAPENTIN 300 MG PO CAPS: 300.0000 mg | ORAL_CAPSULE | Freq: Three times a day (TID) | ORAL | 0 refills | Status: DC

## 2019-05-08 MED ORDER — ACETAMINOPHEN 500 MG PO TABS: 500.0000 mg | ORAL_TABLET | Freq: Two times a day (BID) | ORAL | 1 refills | Status: DC

## 2019-05-08 MED ORDER — METHOCARBAMOL 750 MG PO TABS: 750.0000 mg | ORAL_TABLET | Freq: Three times a day (TID) | ORAL | 0 refills | Status: DC | PRN

## 2019-05-08 NOTE — Progress Notes (Signed)
Orthopaedic Trauma Progress Note  S: Patient doing well this morning. Pain has been relatively well controlled with decreases in pain medications. He denies any fevers, chills, nausea, vomiting. Is very eager to go home.  O:  Vitals:   05/07/19 2137 05/08/19 0414  BP: 121/77 107/71  Pulse: 85 75  Resp: 18 18  Temp: 98.4 F (36.9 C) (!) 97.5 F (36.4 C)  SpO2: 99% 98%    General - Sitting up in bed, NAD  Cardiac - Heart regular rate and rhythm Respiratory - No increased work of breathing, lungs CTA anterior lung fields bilaterally Right Lower Extremity - Knee effusion, but overall swelling in knee and lower leg continuing to improve. Incisions clean, dry, intact. Continues to have tenderness over anterior aspect of knee. Full ankle ROM. Unable to fully extend knee, about 10-15 degree flexion contracture. Motor function intact distally. Has some decreased sensation over anterior tibia. Neurovascularly intact.   Imaging: Follow up knee XR shows knee effusion present. All hardware remains in good position. No signs of failure or loosening. Alignment remains near anatomic with unchanged slight valgus angulation of the Tibia. Small amount of callus formation has occurred over fracture.   Labs:  Results for orders placed or performed during the hospital encounter of 04/17/19 (from the past 24 hour(s))  Basic metabolic panel     Status: Abnormal   Collection Time: 05/08/19  4:03 AM  Result Value Ref Range   Sodium 137 135 - 145 mmol/L   Potassium 4.1 3.5 - 5.1 mmol/L   Chloride 104 98 - 111 mmol/L   CO2 26 22 - 32 mmol/L   Glucose, Bld 102 (H) 70 - 99 mg/dL   BUN 12 6 - 20 mg/dL   Creatinine, Ser 0.81 0.61 - 1.24 mg/dL   Calcium 8.8 (L) 8.9 - 10.3 mg/dL   GFR calc non Af Amer >60 >60 mL/min   GFR calc Af Amer >60 >60 mL/min   Anion gap 7 5 - 15    Assessment: 48 year old male with wound infection following ORIF right tibial plateau 01/20/2019  Injuries: 1. Right bicondylar  tibial plateau fracture s/p ORIF 01/20/19 with removal of one medial screw 04/17/19 2. Right leg postoperative infection s/p I&D right tibia wound/infection 04/17/19 3. Right knee septic arthritis s/p I&D 04/17/19   Weightbearing: Partial WB RLE  Insicional and dressing care: Incisions can be left open to air, apply dressing as needed  Orthopedic device(s): Knee immobilizer RLE, zero knee bone foam  Okay to not wear knee immobilizer when working with PT, okay to work on gentle ROM. If any drainage will have to decrease the motion   CV/Blood loss: Hgb stable. Hemodynamically stable  Pain management:  1. Tylenol 650 mg q 6 hours scheduled 2. Robaxin 750 mg q 8 hours PRN 3. Oxycodone 15 mg q 4 hours PRN 4. Neurontin 300 mg TID 5. Dilaudid 1 mg q 12 hours PRN   VTE prophylaxis: Aspirin 325 mg daily  ID: Per ID  Dispo: Patient will remain in hospital for IV ABX through 05/08/2019 per ID then transition to PO abx starting 05/09/2019 and will likely discharge on that day.    Follow - up plan: Ortho trauma F/U with Dr. Doreatha Martin 6/2 @ 1:30 pm    He has a lab appointment on 5/21 @ [facility] F/U with Catalina Antigua 6/2 @ 3:30 pm    Phung Kotas [facility] ?[phone]? (phone)

## 2019-05-08 NOTE — Progress Notes (Signed)
Orthopedic Tech Progress Note Patient Details:  Evan [name] 1971/12/15 [id number] Therapy called requesting crutches for patient. Ortho Devices Type of Ortho Device: Crutches Ortho Device/Splint Location: LRE Ortho Device/Splint Interventions: Adjustment, Application, Ordered   Post Interventions Patient Tolerated: Well Instructions Provided: Care of device, Adjustment of device   [name] 05/08/2019, 12:25 PM

## 2019-05-08 NOTE — Discharge Summary (Signed)
Orthopaedic Trauma Service (OTS) Discharge Summary   Patient ID: [name] MRN: [id number] DOB/AGE: 1971/07/05 48 y.o.  Admit date: 04/17/2019 Discharge date: 05/09/2019  Admission Diagnoses: Septic arthritis right knee  Discharge Diagnoses:  Principal Problem:   Septic arthritis of knee, right ([place]) Active Problems:   Hardware complicating wound infection ([place])   Hepatitis C virus infection cured after antiviral drug therapy   Fracture of right tibial plateau   IV drug abuse ([place])   Past Medical History:  Diagnosis Date  . ADD (attention deficit disorder) 05/11/2013  . ANXIETY 12/10/2007  . BACK PAIN 08/03/2009  . Chronic pain syndrome 01/28/2014  . Depression 06/15/2014  . ERECTILE DYSFUNCTION 12/10/2007  . Hepatitis C 05/15/2013     "treated and cured"  . HYPERTENSION 11/21/2007  . INSOMNIA-SLEEP DISORDER-UNSPEC 12/20/2008  . Melanoma ([place]) 2015   Lymph nodes left side , radiation  . Neuropathy   . 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 Preventative health care 05/14/2011     Procedures Performed: 1. CPT 20680-Removal of hardware right tibia 2. CPT 27310-Incision and drainage of right knee 3. CPT 10061-Irrigation and debridement of right tibia wound/infection  Discharged Condition: good/stable  Hospital Course: Patient presented to the hospital on 04/17/19 for incision and drainage of right knee with irrigation debridement of right tibia wound/infection after initial ORIF right tibial plateau fracture on 01/20/19. Wound cultures were obtained intraoperatively. There was no growth x 5 days. Infectious Disease was consulted for IV antibiotic management. Patient received a PICC line with initial plan to be discharged with PICC line for 6 week course of IV antibiotics at home but due to IVDU history noted in the chart from 2019 patient was unable to be discharged with PICC line. He has remained in the hospital to receive IV antibiotics x 3 weeks and  infectious disease has now deemed it safe to transition to an oral antibiotic regimen for an additional 3 weeks prior to switching to a suppressive regimen until hardware can be removed. We have utilized the patient's time while hospitalized to safely taper down his high doses of narcotics he has previously required.   On 05/09/2019, the patient was tolerating diet, working well with therapies, pain well controlled, vital signs stable, dressings clean, dry, intact and felt stable for discharge to home. Patient will follow up as below and knows to call with questions or concerns.     Consults: ID  Significant Diagnostic Studies: microbiology: wound culture: negative  Treatments: surgery: 1. CPT 20680-Removal of hardware right tibia 2. CPT 27310-Incision and drainage of right knee 3. CPT 10061-Irrigation and debridement of right tibia wound/infection  Discharge Exam: General - Sitting up in bed, NAD         Cardiac - Heart regular rate and rhythm Respiratory - No increased work of breathing, lungs CTA anterior lung fields bilaterally Right Lower Extremity - Knee effusion, but overall swelling in knee and lower leg continuing to improve. Incisions clean, dry, intact. Continues to have tenderness over anterior aspect of knee. Full ankle ROM. Unable to fully extend knee, about 10-15 degree flexion contracture. Motor function intact distally. Has some decreased sensation over anterior tibia. Neurovascularly intact.   Disposition: Home/self care   Allergies as of 05/09/2019      Reactions   Contrast Media [iodinated Diagnostic Agents] Shortness Of Breath, Swelling   Shortness of breath., throat swelling   Fish Allergy Anaphylaxis   No reaction to shellfish      Medication List  STOP taking these medications   doxycycline 100 MG capsule Commonly known as:  MONODOX   ibuprofen 600 MG tablet Commonly known as:  ADVIL   PARoxetine 10 MG tablet Commonly known as:  PAXIL   QUEtiapine 100  MG tablet Commonly known as:  SEROquel     TAKE these medications   acetaminophen 500 MG tablet Commonly known as:  TYLENOL Take 1 tablet (500 mg total) by mouth every 12 (twelve) hours.   ALPRAZolam 0.5 MG tablet Commonly known as:  XANAX Take 0.5 mg by mouth 2 (two) times daily. What changed:  Another medication with the same name was removed. Continue taking this medication, and follow the directions you see here.   amphetamine-dextroamphetamine 30 MG tablet Commonly known as:  ADDERALL Take 1 tablet by mouth 3 (three) times daily. To be filled Mar 01, 2018 What changed:    when to take this  reasons to take this  additional instructions   aspirin EC 325 MG tablet Take 1 tablet (325 mg total) by mouth daily.   gabapentin 300 MG capsule Commonly known as:  NEURONTIN Take 1 capsule (300 mg total) by mouth 3 (three) times daily.   levofloxacin 750 MG tablet Commonly known as:  Levaquin Take 1 tablet (750 mg total) by mouth daily for 21 days.   linezolid 600 MG tablet Commonly known as:  ZYVOX Take 1 tablet (600 mg total) by mouth 2 (two) times daily for 21 days.   methocarbamol 750 MG tablet Commonly known as:  ROBAXIN Take 1 tablet (750 mg total) by mouth every 8 (eight) hours as needed for muscle spasms.   oxyCODONE 5 MG immediate release tablet Commonly known as:  Oxy IR/ROXICODONE Take 10 mg by mouth every 4 (four) hours as needed for severe pain. What changed:  Another medication with the same name was removed. Continue taking this medication, and follow the directions you see here.   rifampin 300 MG capsule Commonly known as:  RIFADIN Take 1 capsule (300 mg total) by mouth 2 (two) times daily for 21 days.   sildenafil 100 MG tablet Commonly known as:  Viagra TAKE ONE TABLET BY MOUTH ONCE A DAY AS NEEDED What changed:    how much to take  how to take this  when to take this  reasons to take this  additional instructions   Vitamin D 50 MCG (2000  UT) tablet Take 2,000 Units by mouth daily with lunch.   zolpidem 10 MG tablet Commonly known as:  AMBIEN TAKE ONE TABLET AT BEDTIME AS NEEDED FOR SLEEP What changed:  See the new instructions.            Durable Medical Equipment  (From admission, onward)         Start     Ordered   05/08/19 1155  For home use only DME Crutches  Once     05/08/19 1154         Follow-up Information    [place] Follow up.   Why:  follow up appointment May 19, 2019 at 11 am  Contact information: [place] [phone] [facility] Follow up.   Specialty:  Infectious Diseases Why:  Lab only appointment Thursday 5/21 @ 3:00 pm Follow up with Dr. Catalina Antigua Tuesday 6/2 @ 3:30 pm  Contact information: [place], [place] [id number] [place]  [place]       Shona Needles, MD. Go on 05/26/2019.   Specialty:  Orthopedic Surgery Why:  Appointment 05/26/2019 at 1:30pm Contact information: [place] [address] [phone]           Discharge Instructions and Plan: Patient will be discharged to home on PO antibiotics regimen of Linezolid 600 mg BID, Levaquin 750 mg QD, Rifampin 300 mg BID for another 3 weeks prior to switching to suppressive regimen until [facility] can be removed. He will remain on Aspirin 325 mg daily for DVT prophylaxis for another 1 week. He will continue to be partial weightbearing on the right lower extremity. He has been provided with all the necessary DME needed for discharge. All of his follow-up appointments have been scheduled as above and he is aware of each of these. He has been instructed to follow up with Dr. Doreatha Martin earlier than his scheduled appointment if he develops any drainage from the knee or surrounding area.    Signed:  Leary Roca. Carmie Kanner ?[phone]? (phone)  05/09/2019, 7:30 AM  [facility] [place] [address] [address] [phone] (F)

## 2019-05-08 NOTE — TOC Progression Note (Signed)
Transition of Care [facility]) - Progression Note    Patient Details  Name: JERMARION POFFENBERGER MRN: [id number] Date of Birth: [birth year]  [facility] CM/SW Contact  Jacalyn Lefevre Edson Snowball, RN Phone Number: 05/08/2019, 12:36 PM  Clinical Narrative:     [facility] pharmacy filled scripts and gave medications to patient   Expected Discharge Plan: Home/Self Care Barriers to Discharge: Active Substance Use with PICC Line  Expected Discharge Plan and Services Expected Discharge Plan: Home/Self Care In-house Referral: Financial Counselor Discharge Planning Services: CM Consult Post Acute Care Choice: [place] arrangements for the past 2 months: Single Family Home                   DME Agency: NA       HH Arranged: NA(patient to stay in hospital for IV ABX , PT updated recommendation : No PT follow up ) [place] Agency: NA Date HH Agency Contacted: 04/20/19 Time [place]: 1210 Representative spoke with at [place]: [place] ([place]) Interventions    Readmission Risk Interventions No flowsheet data found.

## 2019-05-09 MED ORDER — LINEZOLID 600 MG PO TABS
600.0000 mg | ORAL_TABLET | Freq: Two times a day (BID) | ORAL | Status: DC
  Filled 2019-05-09 (×2): qty 1

## 2019-05-09 MED ORDER — CIPROFLOXACIN HCL 500 MG PO TABS
750.0000 mg | ORAL_TABLET | Freq: Two times a day (BID) | ORAL | Status: DC
  Administered 2019-05-09: 750 mg via ORAL
  Filled 2019-05-09: qty 2

## 2019-05-09 NOTE — Progress Notes (Signed)
Evan [name] to be D/C'd home per MD order.  Discussed prescriptions and follow up appointments with the patient. Prescriptions given to patient, medication list explained in detail. Pt verbalized understanding.  Allergies as of 05/09/2019      Reactions   Contrast Media [iodinated Diagnostic Agents] Shortness Of Breath, Swelling   Shortness of breath., throat swelling   Fish Allergy Anaphylaxis   No reaction to shellfish      Medication List    STOP taking these medications   doxycycline 100 MG capsule Commonly known as:  MONODOX   ibuprofen 600 MG tablet Commonly known as:  ADVIL   PARoxetine 10 MG tablet Commonly known as:  PAXIL   QUEtiapine 100 MG tablet Commonly known as:  SEROquel     TAKE these medications   acetaminophen 500 MG tablet Commonly known as:  TYLENOL Take 1 tablet (500 mg total) by mouth every 12 (twelve) hours.   ALPRAZolam 0.5 MG tablet Commonly known as:  XANAX Take 0.5 mg by mouth 2 (two) times daily. What changed:  Another medication with the same name was removed. Continue taking this medication, and follow the directions you see here.   amphetamine-dextroamphetamine 30 MG tablet Commonly known as:  ADDERALL Take 1 tablet by mouth 3 (three) times daily. To be filled Mar 01, 2018 What changed:    when to take this  reasons to take this  additional instructions   aspirin EC 325 MG tablet Take 1 tablet (325 mg total) by mouth daily.   gabapentin 300 MG capsule Commonly known as:  NEURONTIN Take 1 capsule (300 mg total) by mouth 3 (three) times daily.   levofloxacin 750 MG tablet Commonly known as:  Levaquin Take 1 tablet (750 mg total) by mouth daily for 21 days.   linezolid 600 MG tablet Commonly known as:  ZYVOX Take 1 tablet (600 mg total) by mouth 2 (two) times daily for 21 days.   methocarbamol 750 MG tablet Commonly known as:  ROBAXIN Take 1 tablet (750 mg total) by mouth every 8 (eight) hours as needed for muscle  spasms.   oxyCODONE 5 MG immediate release tablet Commonly known as:  Oxy IR/ROXICODONE Take 10 mg by mouth every 4 (four) hours as needed for severe pain. What changed:  Another medication with the same name was removed. Continue taking this medication, and follow the directions you see here.   rifampin 300 MG capsule Commonly known as:  RIFADIN Take 1 capsule (300 mg total) by mouth 2 (two) times daily for 21 days.   sildenafil 100 MG tablet Commonly known as:  Viagra TAKE ONE TABLET BY MOUTH ONCE A DAY AS NEEDED What changed:    how much to take  how to take this  when to take this  reasons to take this  additional instructions   Vitamin D 50 MCG (2000 UT) tablet Take 2,000 Units by mouth daily with lunch.   zolpidem 10 MG tablet Commonly known as:  AMBIEN TAKE ONE TABLET AT BEDTIME AS NEEDED FOR SLEEP What changed:  See the new instructions.            Durable Medical Equipment  (From admission, onward)         Start     Ordered   05/08/19 1155  For home use only DME Crutches  Once     05/08/19 1154          Vitals:   05/08/19 2137 05/09/19 0546  BP: 126/86  106/72  Pulse: 72 70  Resp: 18 18  Temp: 98.3 F (36.8 C) 97.6 F (36.4 C)  SpO2: 99% 100%    Skin clean, dry and intact without evidence of skin break down, no evidence of skin tears noted. IV catheter discontinued intact. Site without signs and symptoms of complications. Dressing and pressure applied. Pt denies pain at this time. No complaints noted.  An After Visit Summary was printed and given to the patient. Patient escorted via [place], and D/C home via private auto.  Maryan Rued RN [place]

## 2019-05-14 ENCOUNTER — Other Ambulatory Visit: Payer: Self-pay

## 2019-05-17 NOTE — Progress Notes (Deleted)
° °  Subjective:    Patient ID: Evan [name], male    DOB: 02-13-1971, 48 y.o.   MRN: [id number]  Admit date: 04/17/2019 Discharge date: 05/09/2019  Admission Diagnoses: Septic arthritis right knee  Discharge Diagnoses:  Principal Problem:   Septic arthritis of knee, right ([place]) Active Problems:   Hardware complicating wound infection ([place])   Hepatitis C virus infection cured after antiviral drug therapy   Fracture of right tibial plateau   IV drug abuse [facility])   Past Medical History: Diagnosis Date  ADD (attention deficit disorder) 05/11/2013  ANXIETY 12/10/2007  BACK PAIN 08/03/2009  Chronic pain syndrome 01/28/2014  Depression 06/15/2014  ERECTILE DYSFUNCTION 12/10/2007  Hepatitis C 05/15/2013    "treated and cured"  HYPERTENSION 11/21/2007  INSOMNIA-SLEEP DISORDER-UNSPEC 12/20/2008  Melanoma ([place]) 2015  Lymph nodes left side , radiation  Neuropathy   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 CPT 20680-Removal of hardware right tibia 2. CPT 27310-Incision and drainage of right knee 3. CPT 10061-Irrigation and debridement of right tibia wound/infection  Discharged Condition: good/stable  Hospital Course: Patient presented to the hospital on 04/17/19 for incision and drainage of right knee with irrigation debridement of right tibia wound/infection after initial ORIF right tibial plateau fracture on 01/20/19. Wound cultures were obtained intraoperatively. There was no growth x 5 days. Infectious Disease was consulted for IV antibiotic management. Patient received a PICC line with initial plan to be discharged with PICC line for 6 week course of IV antibiotics at home but due to IVDU history noted in the chart from 2019 patient was unable to be discharged with PICC line. He has remained in the hospital to receive IV antibiotics x 3 weeks and infectious disease has now deemed it safe to  transition to an oral antibiotic regimen for an additional 3 weeks prior to switching to a suppressive regimen until hardware can be removed. We have utilized the patient's time while hospitalized to safely taper down his high doses of narcotics he has previously required.   On 05/09/2019, the patient was tolerating diet,working well with therapies, pain well controlled, vital signs stable,dressings clean, dry, intactand felt stable for dischargetohome. Patient will follow up as below and knows to call with questions or concerns.   Consults: ID  Significant Diagnostic Studies: microbiology: wound culture: negative  Treatments: surgery: 1. CPT 20680-Removal of hardware right tibia 2. CPT 27310-Incision and drainage of right knee 3. CPT 10061-Irrigation and debridement of right tibia wound/infection  Discharge Exam: General - Sitting up in bed, NAD Cardiac - Heart regular rate and rhythm Respiratory - No increased work of breathing, lungs CTA anterior lung fields bilaterally Right Lower Extremity - Knee effusion, but overall swelling in knee and lower leg continuing to improve. Incisions clean, dry, intact. Continues to have tenderness over anterior aspect of knee. Full ankle ROM. Unable to fully extend knee, about 10-15 degree flexion contracture. Motor function intact distally. Has some decreased sensation over anterior tibia. Neurovascularly intact.        Review of Systems     Objective:   Physical Exam        Assessment & Plan:

## 2019-05-19 ENCOUNTER — Inpatient Hospital Stay: Payer: Self-pay | Admitting: Critical Care Medicine

## 2019-05-19 ENCOUNTER — Other Ambulatory Visit: Payer: Self-pay | Admitting: *Deleted

## 2019-05-19 ENCOUNTER — Other Ambulatory Visit: Payer: Self-pay

## 2019-05-19 ENCOUNTER — Telehealth: Payer: Self-pay | Admitting: *Deleted

## 2019-05-19 DIAGNOSIS — M009 Pyogenic arthritis, unspecified: Secondary | ICD-10-CM

## 2019-05-19 NOTE — Telephone Encounter (Signed)
Staff called patient to resch his appt for today due to patient not showing pt. Per provider to call patient and try to resch appt. Staff was not able reach/speak with patient or leave message due to patient voicemail box being full.  Patient will need to resch his appt to see provider if he calls office back.

## 2019-05-20 LAB — CBC
HCT: 37 % — ABNORMAL LOW (ref 38.5–50.0)
Hemoglobin: 11.4 g/dL — ABNORMAL LOW (ref 13.2–17.1)
MCH: 24.3 pg — ABNORMAL LOW (ref 27.0–33.0)
MCHC: 30.8 g/dL — ABNORMAL LOW (ref 32.0–36.0)
MCV: 78.9 fL — ABNORMAL LOW (ref 80.0–100.0)
MPV: 9.3 fL (ref 7.5–12.5)
Platelets: 328 10*3/uL (ref 140–400)
RBC: 4.69 10*6/uL (ref 4.20–5.80)
RDW: 16.6 % — ABNORMAL HIGH (ref 11.0–15.0)
WBC: 7.7 10*3/uL (ref 3.8–10.8)

## 2019-05-20 LAB — COMPREHENSIVE METABOLIC PANEL
AG Ratio: 1 (calc) (ref 1.0–2.5)
ALT: 6 U/L — ABNORMAL LOW (ref 9–46)
AST: 10 U/L (ref 10–40)
Albumin: 4 g/dL (ref 3.6–5.1)
Alkaline phosphatase (APISO): 91 U/L (ref 36–130)
BUN: 11 mg/dL (ref 7–25)
CO2: 24 mmol/L (ref 20–32)
Calcium: 9.8 mg/dL (ref 8.6–10.3)
Chloride: 100 mmol/L (ref 98–110)
Creat: 0.9 mg/dL (ref 0.60–1.35)
Globulin: 4 g/dL (calc) — ABNORMAL HIGH (ref 1.9–3.7)
Glucose, Bld: 69 mg/dL (ref 65–99)
Potassium: 4.4 mmol/L (ref 3.5–5.3)
Sodium: 138 mmol/L (ref 135–146)
Total Bilirubin: 0.3 mg/dL (ref 0.2–1.2)
Total Protein: 8 g/dL (ref 6.1–8.1)

## 2019-05-20 LAB — C-REACTIVE PROTEIN: CRP: 87.1 mg/L — ABNORMAL HIGH (ref <8.0)

## 2019-05-20 LAB — SEDIMENTATION RATE: Sed Rate: 72 mm/h — ABNORMAL HIGH (ref 0–15)

## 2019-05-26 ENCOUNTER — Encounter: Payer: Self-pay | Admitting: Infectious Diseases

## 2019-05-26 ENCOUNTER — Ambulatory Visit (INDEPENDENT_AMBULATORY_CARE_PROVIDER_SITE_OTHER): Payer: Self-pay | Admitting: Infectious Diseases

## 2019-05-26 ENCOUNTER — Other Ambulatory Visit: Payer: Self-pay

## 2019-05-26 VITALS — BP 134/83 | HR 97 | Temp 98.0°F | Wt 221.0 lb

## 2019-05-26 DIAGNOSIS — M009 Pyogenic arthritis, unspecified: Secondary | ICD-10-CM

## 2019-05-26 DIAGNOSIS — T8453XA Infection and inflammatory reaction due to internal right knee prosthesis, initial encounter: Secondary | ICD-10-CM

## 2019-05-26 DIAGNOSIS — F1110 Opioid abuse, uncomplicated: Secondary | ICD-10-CM

## 2019-05-26 MED ORDER — CIPROFLOXACIN HCL 750 MG PO TABS: 750.0000 mg | ORAL_TABLET | Freq: Two times a day (BID) | ORAL | 0 refills | Status: DC

## 2019-05-26 MED ORDER — LINEZOLID 600 MG PO TABS: 600.0000 mg | ORAL_TABLET | Freq: Two times a day (BID) | ORAL | 0 refills | Status: DC
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
## 2019-05-26 NOTE — Patient Instructions (Signed)
Stop taking rifampin!! Continue taking cipro and zyvox (linezolid) for 3 weeks (a new rx was sent to your [facility]). Return to clinic on Thursday June 11,2020 for repeat labs. Return to clinic in 3 weeks to see Dr. Prince Rome.

## 2019-05-27 LAB — CBC WITH DIFFERENTIAL/PLATELET
Absolute Monocytes: 470 cells/uL (ref 200–950)
Basophils Absolute: 8 cells/uL (ref 0–200)
Basophils Relative: 0.1 %
Eosinophils Absolute: 42 cells/uL (ref 15–500)
Eosinophils Relative: 0.5 %
HCT: 36.1 % — ABNORMAL LOW (ref 38.5–50.0)
Hemoglobin: 11.1 g/dL — ABNORMAL LOW (ref 13.2–17.1)
Lymphs Abs: 1420 cells/uL (ref 850–3900)
MCH: 24.2 pg — ABNORMAL LOW (ref 27.0–33.0)
MCHC: 30.7 g/dL — ABNORMAL LOW (ref 32.0–36.0)
MCV: 78.8 fL — ABNORMAL LOW (ref 80.0–100.0)
MPV: 8.8 fL (ref 7.5–12.5)
Monocytes Relative: 5.6 %
Neutro Abs: 6460 cells/uL (ref 1500–7800)
Neutrophils Relative %: 76.9 %
Platelets: 365 10*3/uL (ref 140–400)
RBC: 4.58 10*6/uL (ref 4.20–5.80)
RDW: 17.1 % — ABNORMAL HIGH (ref 11.0–15.0)
Total Lymphocyte: 16.9 %
WBC: 8.4 10*3/uL (ref 3.8–10.8)

## 2019-05-27 LAB — BASIC METABOLIC PANEL
BUN: 11 mg/dL (ref 7–25)
CO2: 23 mmol/L (ref 20–32)
Calcium: 9.6 mg/dL (ref 8.6–10.3)
Chloride: 105 mmol/L (ref 98–110)
Creat: 0.95 mg/dL (ref 0.60–1.35)
Glucose, Bld: 124 mg/dL — ABNORMAL HIGH (ref 65–99)
Potassium: 3.9 mmol/L (ref 3.5–5.3)
Sodium: 139 mmol/L (ref 135–146)

## 2019-05-27 LAB — C-REACTIVE PROTEIN: CRP: 47.8 mg/L — ABNORMAL HIGH (ref <8.0)

## 2019-06-04 ENCOUNTER — Other Ambulatory Visit: Payer: Self-pay

## 2019-06-04 DIAGNOSIS — M009 Pyogenic arthritis, unspecified: Secondary | ICD-10-CM

## 2019-06-05 LAB — C-REACTIVE PROTEIN: CRP: 61.5 mg/L — ABNORMAL HIGH (ref <8.0)

## 2019-06-05 LAB — CBC WITH DIFFERENTIAL/PLATELET
Absolute Monocytes: 504 cells/uL (ref 200–950)
Basophils Absolute: 19 cells/uL (ref 0–200)
Basophils Relative: 0.3 %
Eosinophils Absolute: 101 cells/uL (ref 15–500)
Eosinophils Relative: 1.6 %
HCT: 37.9 % — ABNORMAL LOW (ref 38.5–50.0)
Hemoglobin: 11.4 g/dL — ABNORMAL LOW (ref 13.2–17.1)
Lymphs Abs: 1607 cells/uL (ref 850–3900)
MCH: 24.6 pg — ABNORMAL LOW (ref 27.0–33.0)
MCHC: 30.1 g/dL — ABNORMAL LOW (ref 32.0–36.0)
MCV: 81.7 fL (ref 80.0–100.0)
MPV: 9 fL (ref 7.5–12.5)
Monocytes Relative: 8 %
Neutro Abs: 4070 cells/uL (ref 1500–7800)
Neutrophils Relative %: 64.6 %
Platelets: 338 10*3/uL (ref 140–400)
RBC: 4.64 10*6/uL (ref 4.20–5.80)
RDW: 18 % — ABNORMAL HIGH (ref 11.0–15.0)
Total Lymphocyte: 25.5 %
WBC: 6.3 10*3/uL (ref 3.8–10.8)

## 2019-06-05 LAB — BASIC METABOLIC PANEL
BUN: 10 mg/dL (ref 7–25)
CO2: 24 mmol/L (ref 20–32)
Calcium: 9.4 mg/dL (ref 8.6–10.3)
Chloride: 103 mmol/L (ref 98–110)
Creat: 0.89 mg/dL (ref 0.60–1.35)
Glucose, Bld: 71 mg/dL (ref 65–99)
Potassium: 4.6 mmol/L (ref 3.5–5.3)
Sodium: 140 mmol/L (ref 135–146)

## 2019-06-23 ENCOUNTER — Encounter: Payer: Self-pay | Admitting: Infectious Diseases

## 2019-06-23 ENCOUNTER — Ambulatory Visit (INDEPENDENT_AMBULATORY_CARE_PROVIDER_SITE_OTHER): Payer: Self-pay | Admitting: Infectious Diseases

## 2019-06-23 ENCOUNTER — Other Ambulatory Visit: Payer: Self-pay

## 2019-06-23 VITALS — BP 118/76 | HR 88 | Temp 98.8°F | Wt 224.0 lb

## 2019-06-23 DIAGNOSIS — M009 Pyogenic arthritis, unspecified: Secondary | ICD-10-CM

## 2019-06-23 DIAGNOSIS — F1110 Opioid abuse, uncomplicated: Secondary | ICD-10-CM

## 2019-06-23 DIAGNOSIS — T8453XA Infection and inflammatory reaction due to internal right knee prosthesis, initial encounter: Secondary | ICD-10-CM
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
## 2019-06-24 LAB — COMPREHENSIVE METABOLIC PANEL
AG Ratio: 1.1 (calc) (ref 1.0–2.5)
ALT: 5 U/L — ABNORMAL LOW (ref 9–46)
AST: 11 U/L (ref 10–40)
Albumin: 3.6 g/dL (ref 3.6–5.1)
Alkaline phosphatase (APISO): 69 U/L (ref 36–130)
BUN: 10 mg/dL (ref 7–25)
CO2: 24 mmol/L (ref 20–32)
Calcium: 9.2 mg/dL (ref 8.6–10.3)
Chloride: 104 mmol/L (ref 98–110)
Creat: 0.92 mg/dL (ref 0.60–1.35)
Globulin: 3.2 g/dL (calc) (ref 1.9–3.7)
Glucose, Bld: 86 mg/dL (ref 65–99)
Potassium: 4.5 mmol/L (ref 3.5–5.3)
Sodium: 140 mmol/L (ref 135–146)
Total Bilirubin: 0.3 mg/dL (ref 0.2–1.2)
Total Protein: 6.8 g/dL (ref 6.1–8.1)

## 2019-06-24 LAB — CBC WITH DIFFERENTIAL/PLATELET
Absolute Monocytes: 450 cells/uL (ref 200–950)
Basophils Absolute: 10 cells/uL (ref 0–200)
Basophils Relative: 0.2 %
Eosinophils Absolute: 80 cells/uL (ref 15–500)
Eosinophils Relative: 1.6 %
HCT: 32.7 % — ABNORMAL LOW (ref 38.5–50.0)
Hemoglobin: 10.4 g/dL — ABNORMAL LOW (ref 13.2–17.1)
Lymphs Abs: 1485 cells/uL (ref 850–3900)
MCH: 25.7 pg — ABNORMAL LOW (ref 27.0–33.0)
MCHC: 31.8 g/dL — ABNORMAL LOW (ref 32.0–36.0)
MCV: 80.7 fL (ref 80.0–100.0)
MPV: 10.6 fL (ref 7.5–12.5)
Monocytes Relative: 9 %
Neutro Abs: 2975 cells/uL (ref 1500–7800)
Neutrophils Relative %: 59.5 %
Platelets: 254 10*3/uL (ref 140–400)
RBC: 4.05 10*6/uL — ABNORMAL LOW (ref 4.20–5.80)
RDW: 18.6 % — ABNORMAL HIGH (ref 11.0–15.0)
Total Lymphocyte: 29.7 %
WBC: 5 10*3/uL (ref 3.8–10.8)

## 2019-06-24 LAB — C-REACTIVE PROTEIN: CRP: 65.7 mg/L — ABNORMAL HIGH (ref <8.0)

## 2019-06-25 ENCOUNTER — Telehealth: Payer: Self-pay | Admitting: *Deleted

## 2019-06-25 MED ORDER — DOXYCYCLINE HYCLATE 100 MG PO TABS: 100.0000 mg | ORAL_TABLET | Freq: Two times a day (BID) | ORAL | 1 refills | Status: DC

## 2019-06-25 NOTE — Telephone Encounter (Signed)
Per Dr Prince Rome called the patient to advise of new medication called in and no one answered. Left message for him to call the office will advise once he does.

## 2019-06-25 NOTE — Telephone Encounter (Signed)
-----   Message from Janine Ores, MD sent at 06/25/2019 10:15 AM EDT ----- I reviewed the patient's CRP and other labs from his recent visit. Unfortunately, his CRP remains elevated, so I have e-rx'ed doxycycline to his preferred pharmacy. He always struggles with cost. If he calls back, the med will need to remain the same but perhaps the pharmacy staff can help him locate the most affordable place for him to get the med. I need him to keep the appointment we scheduled for him next month and make sure he knows I want him on this ABX until he sees me next. He had a 2 day lapse in treatment prior to his last visit. Thanks

## 2019-12-01 ENCOUNTER — Other Ambulatory Visit (HOSPITAL_COMMUNITY): Payer: Self-pay | Admitting: Student

## 2019-12-01 DIAGNOSIS — M79604 Pain in right leg: Secondary | ICD-10-CM

## 2019-12-01 DIAGNOSIS — M7989 Other specified soft tissue disorders: Secondary | ICD-10-CM

## 2019-12-02 ENCOUNTER — Ambulatory Visit (HOSPITAL_COMMUNITY): Admission: RE | Admit: 2019-12-02 | Payer: Self-pay | Source: Ambulatory Visit
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
## 2020-10-16 ENCOUNTER — Encounter (HOSPITAL_COMMUNITY): Payer: Self-pay | Admitting: Emergency Medicine

## 2020-10-16 ENCOUNTER — Emergency Department (HOSPITAL_COMMUNITY)
Admission: EM | Admit: 2020-10-16 | Discharge: 2020-10-17 | Disposition: A | Discharge status: Home / Self Care | Payer: Self-pay | Attending: Emergency Medicine | Admitting: Emergency Medicine

## 2020-10-16 ENCOUNTER — Other Ambulatory Visit: Payer: Self-pay

## 2020-10-16 DIAGNOSIS — Z87891 Personal history of nicotine dependence: Secondary | ICD-10-CM | POA: Insufficient documentation

## 2020-10-16 DIAGNOSIS — R609 Edema, unspecified: Secondary | ICD-10-CM | POA: Insufficient documentation

## 2020-10-16 DIAGNOSIS — M79662 Pain in left lower leg: Secondary | ICD-10-CM | POA: Insufficient documentation

## 2020-10-16 DIAGNOSIS — I10 Essential (primary) hypertension: Secondary | ICD-10-CM | POA: Insufficient documentation

## 2020-10-16 DIAGNOSIS — Z20822 Contact with and (suspected) exposure to covid-19: Secondary | ICD-10-CM | POA: Insufficient documentation

## 2020-10-16 NOTE — ED Triage Notes (Signed)
Patient reports bilateral ankle /feet swelling and redness with pain onset last night , denies injury .

## 2020-10-17 ENCOUNTER — Emergency Department (HOSPITAL_COMMUNITY): Payer: Self-pay

## 2020-10-17 ENCOUNTER — Emergency Department (HOSPITAL_BASED_OUTPATIENT_CLINIC_OR_DEPARTMENT_OTHER): Payer: Self-pay

## 2020-10-17 DIAGNOSIS — M7989 Other specified soft tissue disorders: Secondary | ICD-10-CM

## 2020-10-17 DIAGNOSIS — R609 Edema, unspecified: Secondary | ICD-10-CM

## 2020-10-17 LAB — CBC WITH DIFFERENTIAL/PLATELET
Abs Immature Granulocytes: 0.01 10*3/uL (ref 0.00–0.07)
Basophils Absolute: 0 10*3/uL (ref 0.0–0.1)
Basophils Relative: 0 %
Eosinophils Absolute: 0.2 10*3/uL (ref 0.0–0.5)
Eosinophils Relative: 3 %
HCT: 39.3 % (ref 39.0–52.0)
Hemoglobin: 12.2 g/dL — ABNORMAL LOW (ref 13.0–17.0)
Immature Granulocytes: 0 %
Lymphocytes Relative: 26 %
Lymphs Abs: 1.8 10*3/uL (ref 0.7–4.0)
MCH: 27.9 pg (ref 26.0–34.0)
MCHC: 31 g/dL (ref 30.0–36.0)
MCV: 89.9 fL (ref 80.0–100.0)
Monocytes Absolute: 0.5 10*3/uL (ref 0.1–1.0)
Monocytes Relative: 7 %
Neutro Abs: 4.4 10*3/uL (ref 1.7–7.7)
Neutrophils Relative %: 64 %
Platelets: 275 10*3/uL (ref 150–400)
RBC: 4.37 MIL/uL (ref 4.22–5.81)
RDW: 13.6 % (ref 11.5–15.5)
WBC: 7 10*3/uL (ref 4.0–10.5)
nRBC: 0 % (ref 0.0–0.2)

## 2020-10-17 LAB — COMPREHENSIVE METABOLIC PANEL
ALT: 16 U/L (ref 0–44)
AST: 34 U/L (ref 15–41)
Albumin: 3.8 g/dL (ref 3.5–5.0)
Alkaline Phosphatase: 85 U/L (ref 38–126)
Anion gap: 11 (ref 5–15)
BUN: 33 mg/dL — ABNORMAL HIGH (ref 6–20)
CO2: 27 mmol/L (ref 22–32)
Calcium: 9.2 mg/dL (ref 8.9–10.3)
Chloride: 100 mmol/L (ref 98–111)
Creatinine, Ser: 1.28 mg/dL — ABNORMAL HIGH (ref 0.61–1.24)
GFR, Estimated: >60 mL/min (ref >60)
Glucose, Bld: 87 mg/dL (ref 70–99)
Potassium: 4 mmol/L (ref 3.5–5.1)
Sodium: 138 mmol/L (ref 135–145)
Total Bilirubin: 0.8 mg/dL (ref 0.3–1.2)
Total Protein: 7.6 g/dL (ref 6.5–8.1)

## 2020-10-17 LAB — D-DIMER, QUANTITATIVE: D-Dimer, Quant: 3.11 ug/mL-FEU — ABNORMAL HIGH (ref 0.00–0.50)

## 2020-10-17 LAB — BRAIN NATRIURETIC PEPTIDE: B Natriuretic Peptide: 36.3 pg/mL (ref 0.0–100.0)

## 2020-10-17 LAB — RESPIRATORY PANEL BY RT PCR (FLU A&B, COVID)
Influenza A by PCR: NEGATIVE
Influenza B by PCR: NEGATIVE
SARS Coronavirus 2 by RT PCR: NEGATIVE

## 2020-10-17 MED ORDER — HYDROMORPHONE HCL 1 MG/ML IJ SOLN
0.5000 mg | Freq: Once | INTRAMUSCULAR | Status: AC
  Administered 2020-10-17: 0.5 mg via INTRAVENOUS
  Filled 2020-10-17: qty 1

## 2020-10-17 MED ORDER — MORPHINE SULFATE (PF) 4 MG/ML IV SOLN
4.0000 mg | Freq: Once | INTRAVENOUS | Status: AC
  Administered 2020-10-17: 4 mg via INTRAVENOUS
  Filled 2020-10-17 (×2): qty 1

## 2020-10-17 MED ORDER — HEPARIN (PORCINE) 25000 UT/250ML-% IV SOLN
1750.0000 [IU]/h | INTRAVENOUS | Status: DC
  Administered 2020-10-17: 1750 [IU]/h via INTRAVENOUS
  Filled 2020-10-17: qty 250

## 2020-10-17 MED ORDER — DIPHENHYDRAMINE HCL 50 MG/ML IJ SOLN
50.0000 mg | Freq: Once | INTRAMUSCULAR | Status: AC
  Administered 2020-10-17: 50 mg via INTRAVENOUS
  Filled 2020-10-17 (×2): qty 1

## 2020-10-17 MED ORDER — DIPHENHYDRAMINE HCL 25 MG PO CAPS: 50.0000 mg | ORAL_CAPSULE | Freq: Once | ORAL | Status: AC

## 2020-10-17 MED ORDER — HYDROCORTISONE NA SUCCINATE PF 100 MG IJ SOLR
200.0000 mg | Freq: Once | INTRAMUSCULAR | Status: AC
  Administered 2020-10-17: 200 mg via INTRAVENOUS
  Filled 2020-10-17: qty 4

## 2020-10-17 MED ORDER — IOHEXOL 350 MG/ML SOLN
100.0000 mL | Freq: Once | INTRAVENOUS | Status: AC | PRN
  Administered 2020-10-17: 100 mL via INTRAVENOUS

## 2020-10-17 MED ORDER — OXYCODONE HCL 5 MG PO TABS
15.0000 mg | ORAL_TABLET | Freq: Once | ORAL | Status: AC
  Administered 2020-10-17: 15 mg via ORAL
  Filled 2020-10-17: qty 3

## 2020-10-17 MED ORDER — HYDROCORTISONE NA SUCCINATE PF 250 MG IJ SOLR
200.0000 mg | Freq: Once | INTRAMUSCULAR | Status: DC
  Filled 2020-10-17: qty 200

## 2020-10-17 MED ORDER — HEPARIN BOLUS VIA INFUSION
5000.0000 [IU] | Freq: Once | INTRAVENOUS | Status: AC
  Administered 2020-10-17: 5000 [IU] via INTRAVENOUS
  Filled 2020-10-17: qty 5000

## 2020-10-17 MED ORDER — SODIUM CHLORIDE 0.9 % IV SOLN: Freq: Once | INTRAVENOUS | Status: DC

## 2020-10-17 NOTE — ED Notes (Signed)
To Vascular lab

## 2020-10-17 NOTE — ED Notes (Signed)
Pt educated on NPO status in case of possible procedure. Pt stated understanding. Pt repositioned. Pt in NAD.
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
## 2020-10-17 NOTE — Progress Notes (Signed)
Lower extremity venous has been completed.   Preliminary results in CV Proc.   [name] 10/17/2020 8:27 AM

## 2020-10-17 NOTE — Discharge Instructions (Signed)
Use compression socks and elevate the legs.  Return to the ER if you develop fever, chest pain or shortness of breath

## 2020-10-17 NOTE — ED Notes (Signed)
Pt rec'd solu-medrol. Notified CT of patient's pre medications. Pt will be scanned 4 hours after Solu-medrol administration. Benadryl held at this time until 1 hour prior to scan.

## 2020-10-17 NOTE — ED Provider Notes (Signed)
Patient's DVT scan was negative, labs are reassuring, platelet count within normal limits, Covid negative, CT abdomen pelvis negative for acute pathology but 2 small hypodense lesions in the liver that require MRI as an outpatient.  Patient's lower extremity edema may be related to lymphedema versus venous stasis.  Patient has 2+ DP pulses bilaterally and feet are warm with good capillary refill.  Heparin drip was discontinued.  Will place compression socks.  Patient will need outpatient follow-up but at this time does not have indication for admission.  He has no chest pain or shortness of breath.  He will need to elevate his legs.   Blanchie Dessert, MD 10/17/20 1549

## 2020-10-17 NOTE — Progress Notes (Signed)
ANTICOAGULATION CONSULT NOTE - Initial Consult  Pharmacy Consult for Heparin Indication: R/O DVT  Allergies  Allergen Reactions  . Contrast Media [Iodinated Diagnostic Agents] Shortness Of Breath and Swelling    Shortness of breath., throat swelling  . Fish Allergy Anaphylaxis    No reaction to shellfish    Patient Measurements: Height: 6\' 3"  (190.5 cm) Weight: 115 kg (253 lb 8.5 oz) IBW/kg (Calculated) : 84.5 Heparin Dosing Weight: 110 kg  Vital Signs: Temp: 98.3 F (36.8 C) (10/24 2237) Temp Source: Oral (10/24 2237) BP: 152/100 (10/25 0445) Pulse Rate: 86 (10/25 0445)  Labs: Recent Labs    10/17/20 0144  HGB 12.2*  HCT 39.3  PLT 275  CREATININE 1.28*    Estimated Creatinine Clearance: 97.6 mL/min (A) (by C-G formula based on SCr of 1.28 mg/dL (H)).   Medical History: Past Medical History:  Diagnosis Date  . ADD (attention deficit disorder) 05/11/2013  . ANXIETY 12/10/2007  . BACK PAIN 08/03/2009  . Chronic pain syndrome 01/28/2014  . Depression 06/15/2014  . ERECTILE DYSFUNCTION 12/10/2007  . Hepatitis C 05/15/2013     "treated and cured"  . HYPERTENSION 11/21/2007  . INSOMNIA-SLEEP DISORDER-UNSPEC 12/20/2008  . Melanoma ([place]) 2015   Lymph nodes left side , radiation  . Neuropathy   . 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 Preventative health care 05/14/2011    Medications:  No current facility-administered medications on file prior to encounter.   Current Outpatient Medications on File Prior to Encounter  Medication Sig Dispense Refill  . ALPRAZolam (XANAX) 0.5 MG tablet Take 0.5 mg by mouth 3 (three) times daily.     Marland Kitchen amphetamine-dextroamphetamine (ADDERALL) 30 MG tablet Take 1 tablet by mouth 3 (three) times daily. To be filled Mar 01, 2018 90 tablet 0  . oxyCODONE (OXY IR/ROXICODONE) 5 MG immediate release tablet Take 10 mg by mouth every 4 (four) hours as needed for severe pain.    Marland Kitchen zolpidem (AMBIEN) 10 MG tablet TAKE ONE TABLET AT  BEDTIME AS NEEDED FOR SLEEP (Patient taking differently: Take 10 mg by mouth at bedtime. ) 30 tablet 5  . acetaminophen (TYLENOL) 500 MG tablet Take 1 tablet (500 mg total) by mouth every 12 (twelve) hours. (Patient not taking: Reported on 10/17/2020) 60 tablet 1  . doxycycline (VIBRA-TABS) 100 MG tablet Take 1 tablet (100 mg total) by mouth 2 (two) times daily. (Patient not taking: Reported on 10/17/2020) 60 tablet 1  . sildenafil (VIAGRA) 100 MG tablet TAKE ONE TABLET BY MOUTH ONCE A DAY AS NEEDED (Patient not taking: Reported on 05/26/2019) 6 tablet 11  . [DISCONTINUED] Cholecalciferol (VITAMIN D) 50 MCG (2000 UT) tablet Take 2,000 Units by mouth daily with lunch.    . [DISCONTINUED] ciprofloxacin (CIPRO) 750 MG tablet Take 1 tablet (750 mg total) by mouth 2 (two) times daily. (Patient not taking: Reported on 06/23/2019) 42 tablet 0  . [DISCONTINUED] gabapentin (NEURONTIN) 300 MG capsule Take 1 capsule (300 mg total) by mouth 3 (three) times daily. 42 capsule 0  . [DISCONTINUED] linezolid (ZYVOX) 600 MG tablet Take 1 tablet (600 mg total) by mouth 2 (two) times daily. (Patient not taking: Reported on 06/23/2019) 42 tablet 0  . [DISCONTINUED] methocarbamol (ROBAXIN) 750 MG tablet Take 1 tablet (750 mg total) by mouth every 8 (eight) hours as needed for muscle spasms. 21 tablet 0     Assessment: 49 y.o. male with possible DVT for heparin Goal of Therapy:  Heparin level 0.3-0.7 units/ml Monitor platelets by anticoagulation  protocol: Yes   Plan:  Heparin 5000 units IV bolus, then start heparin 1750 units/hr Check heparin level in 6 hours.    Janicia Monterrosa, Bronson Curb 10/17/2020,5:31 AM

## 2020-10-17 NOTE — ED Provider Notes (Addendum)
[facility] EMERGENCY DEPARTMENT Provider Note  CSN: [id number] Arrival date & time: 10/16/20 2222  Chief Complaint(s) Feet/Ankles Swelling  HPI Evan [name] is a 49 y.o. male    The history is provided by the patient.  Leg Pain Location:  Leg Time since incident:  1 day Injury: no   Leg location:  L lower leg and R lower leg Pain details:    Quality:  Throbbing   Radiates to:  Does not radiate   Severity:  Moderate   Onset quality:  Gradual   Duration:  1 day   Timing:  Constant   Progression:  Worsening Chronicity:  New Relieved by:  Nothing Worsened by:  Activity and bearing weight Associated symptoms: swelling     Past Medical History Past Medical History:  Diagnosis Date   ADD (attention deficit disorder) 05/11/2013   ANXIETY 12/10/2007   BACK PAIN 08/03/2009   Chronic pain syndrome 01/28/2014   Depression 06/15/2014   ERECTILE DYSFUNCTION 12/10/2007   Hepatitis C 05/15/2013     "treated and cured"   HYPERTENSION 11/21/2007   INSOMNIA-SLEEP DISORDER-UNSPEC 12/20/2008   Melanoma ([place]) 2015   Lymph nodes left side , radiation   Neuropathy    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   Preventative health care 05/14/2011   Patient Active Problem List   Diagnosis Date Noted   IV drug abuse ([place])    Fracture of right tibial plateau    Septic arthritis of knee, right ([place]) 40/98/1191   Hardware complicating wound infection ([place]) 04/17/2019   Hepatitis C virus infection cured after antiviral drug therapy 04/17/2019   Acute lateral meniscus tear of right knee 01/23/2019   Displaced fracture of right tibial tuberosity, initial encounter for closed fracture 01/16/2019   Closed bicondylar fracture of right tibial plateau 01/15/2019   Cough 10/31/2016   Wheezing 10/31/2016   Encounter for general adult medical examination with abnormal findings 01/27/2016   Depression 06/15/2014   Acute bronchitis 01/28/2014     Chronic pain syndrome 01/28/2014   Hepatitis C 05/15/2013   ADD (attention deficit disorder) 05/11/2013   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 Authorizing Provider  ALPRAZolam Duanne Moron) 0.5 MG tablet Take 0.5 mg by mouth 3 (three) times daily.    Yes [provider]  amphetamine-dextroamphetamine (ADDERALL) 30 MG tablet Take 1 tablet by mouth 3 (three) times daily. To be filled Mar 01, 2018 02/04/18  Yes Biagio Borg, MD  oxyCODONE (OXY IR/ROXICODONE) 5 MG immediate release tablet Take 10 mg by mouth every 4 (four) hours as needed for severe pain.   Yes [provider]  zolpidem (AMBIEN) 10 MG tablet TAKE ONE TABLET AT BEDTIME AS NEEDED FOR SLEEP Patient taking differently: Take 10 mg by mouth at bedtime.  09/17/17  Yes Biagio Borg, MD  acetaminophen (TYLENOL) 500 MG tablet Take 1 tablet (500 mg total) by mouth every 12 (twelve) hours. Patient not taking: Reported on 10/17/2020 05/08/19   Delray Alt, PA-C  doxycycline (VIBRA-TABS) 100 MG tablet Take 1 tablet (100 mg total) by mouth 2 (two) times daily. Patient not taking: Reported on 10/17/2020 06/25/19  Powers, Evern Core, MD  sildenafil (VIAGRA) 100 MG tablet TAKE ONE TABLET BY MOUTH ONCE A DAY AS NEEDED Patient not taking: Reported on 05/26/2019 01/27/16   Biagio Borg, MD                                                                                                                                    Past Surgical History Past Surgical History:  Procedure Laterality Date   EXTERNAL FIXATION LEG Right 01/16/2019   Procedure: EXTERNAL  FIXATION RIGHT KNEE;  Surgeon: Shona Needles, MD;  Location: [place];  Service: Orthopedics;  Laterality: Right;   HARDWARE REMOVAL Right 04/17/2019   Procedure: HARDWARE REMOVAL RIGHT TIBIA;  Surgeon: Shona Needles, MD;  Location: [place];  Service: Orthopedics;  Laterality: Right;   IRRIGATION AND DEBRIDEMENT KNEE Right 04/17/2019   Procedure: IRRIGATION AND DEBRIDEMENT RIGHT KNEE;  Surgeon: Shona Needles, MD;  Location: [place];  Service: Orthopedics;  Laterality: Right;   LYMPHADENECTOMY Left    ORIF TIBIA PLATEAU Right 01/20/2019   Procedure: OPEN REDUCTION INTERNAL FIXATION (ORIF) TIBIAL PLATEAU;  Surgeon: Shona Needles, MD;  Location: [place];  Service: Orthopedics;  Laterality: Right;   SHOULDER SURGERY Left    chronic L dislocating    Family History Family History  Problem Relation Age of Onset   Cancer Father        melanoma on back   Cancer Other        pancreatic    Social History Social History   Tobacco Use   Smoking status: Former Smoker    Quit date: 2000    Years since quitting: 21.8   Smokeless tobacco: Never Used   Tobacco comment: in [employer] fund[employer] Use: Never used  Substance Use Topics   Alcohol use: Not Currently   Drug use: No   Allergies Contrast media [iodinated diagnostic agents] and Fish allergy  Review of Systems Review of Systems All other systems are reviewed and are negative for acute change except as noted in the HPI  Physical Exam Vital Signs  I have reviewed the triage vital signs BP (!) 152/100    Pulse 86    Temp 98.3 F (36.8 C) (Oral)    Resp (!) 25    Ht 6\' 3"  (1.905 m)    Wt 115 kg    SpO2 100%    BMI 31.69 kg/m   Physical Exam Vitals reviewed.  Constitutional:      General: He is not in acute distress.    Appearance: He is well-developed. He is not diaphoretic.  HENT:     Head: Normocephalic and atraumatic.     Nose: Nose normal.  Eyes:     General: No scleral icterus.       Right eye: No  discharge.        Left eye: No discharge.  Conjunctiva/sclera: Conjunctivae normal.     Pupils: Pupils are equal, round, and reactive to light.  Cardiovascular:     Rate and Rhythm: Normal rate and regular rhythm.     Heart sounds: No murmur heard.  No friction rub. No gallop.   Pulmonary:     Effort: Pulmonary effort is normal. No respiratory distress.     Breath sounds: Normal breath sounds. No stridor. No rales.  Abdominal:     General: There is no distension.     Palpations: Abdomen is soft.     Tenderness: There is no abdominal tenderness.  Musculoskeletal:        General: No tenderness.     Cervical back: Normal range of motion and neck supple.     Right lower leg: Swelling present.     Left lower leg: Swelling present.     Comments: SEE IMAGE  Skin:    General: Skin is warm and dry.     Findings: No erythema or rash.  Neurological:     Mental Status: He is alert and oriented to person, place, and time.       ED Results and Treatments Labs (all labs ordered are listed, but only abnormal results are displayed) Labs Reviewed  CBC WITH DIFFERENTIAL/PLATELET - Abnormal; Notable for the following components:      Result Value   Hemoglobin 12.2 (*)    All other components within normal limits  COMPREHENSIVE METABOLIC PANEL - Abnormal; Notable for the following components:   BUN 33 (*)    Creatinine, Ser 1.28 (*)    All other components within normal limits  D-DIMER, QUANTITATIVE (NOT AT [facility]) - Abnormal; Notable for the following components:   D-Dimer, Quant 3.11 (*)    All other components within normal limits  RESPIRATORY PANEL BY RT PCR (FLU A&B, COVID)  BRAIN NATRIURETIC PEPTIDE  HEPARIN LEVEL (UNFRACTIONATED)                                                                                                                         EKG  EKG Interpretation  Date/Time:    Ventricular Rate:    PR Interval:    QRS Duration:   QT Interval:    QTC Calculation:    R Axis:     Text Interpretation:        Radiology DG Chest 2 View  Result Date: 10/17/2020 CLINICAL DATA:  Lower extremity swelling EXAM: CHEST - 2 VIEW COMPARISON:  None. FINDINGS: The heart size and mediastinal contours are within normal limits. Both lungs are clear. The visualized skeletal structures are unremarkable. IMPRESSION: No active cardiopulmonary disease. Electronically Signed   By: Ulyses Jarred M.D.   On: 10/17/2020 02:18   DG Chest Portable 1 View  Result Date: 10/17/2020 CLINICAL DATA:  Central line placement EXAM: PORTABLE CHEST 1 VIEW COMPARISON:  Earlier today FINDINGS: Right IJ line with tip at the upper cavoatrial junction. Lower lung volumes and increased interstitial prominence. No Awanda Mink  lines. No effusion or pneumothorax. Postoperative right axilla. Unremarkable heart size and mediastinal contours for technique. IMPRESSION: 1. No complicating feature after right IJ line placement. 2. Lower lung volumes with symmetric aeration. Electronically Signed   By: Monte Fantasia M.D.   On: 10/17/2020 05:01    Pertinent labs & imaging results that were available during my care of the patient were reviewed by me and considered in my medical decision making (see chart for details).  Medications Ordered in ED Medications  0.9 %  sodium chloride infusion ( Intravenous Not Given 10/17/20 0212)  morphine 4 MG/ML injection 4 mg (4 mg Intravenous Not Given 10/17/20 0210)  diphenhydrAMINE (BENADRYL) capsule 50 mg ( Oral See Alternative 10/17/20 0552)    Or  diphenhydrAMINE (BENADRYL) injection 50 mg (0 mg Intravenous Hold 10/17/20 0552)  heparin ADULT infusion 100 units/mL (25000 units/250mL sodium chloride 0.45%) (1,750 Units/hr Intravenous New Bag/Given 10/17/20 0546)  oxyCODONE (Oxy IR/ROXICODONE) immediate release tablet 15 mg (15 mg Oral Given 10/17/20 0251)  HYDROmorphone (DILAUDID) injection 0.5 mg (0.5 mg Intravenous Given 10/17/20 0547)  hydrocortisone sodium succinate  (SOLU-CORTEF) 100 MG injection 200 mg (200 mg Intravenous Given 10/17/20 0548)  heparin bolus via infusion 5,000 Units (5,000 Units Intravenous Bolus from Bag 10/17/20 0547)                                                                                                                                    Procedures .1-3 Lead EKG Interpretation Performed by: Fatima Blank, MD Authorized by: Fatima Blank, MD     Interpretation: normal     ECG rate:  90   ECG rate assessment: normal     Rhythm: sinus rhythm     Ectopy: none     Conduction: normal   Ultrasound ED Peripheral IV (Provider)  Date/Time: 10/17/2020 5:47 AM Performed by: Fatima Blank, MD Authorized by: Fatima Blank, MD   Procedure details:    Indications: multiple failed IV attempts     Skin Prep: chlorhexidine gluconate     Location:  Right AC   Angiocath:  20 G   Bedside Ultrasound Guided: Yes     Images: not archived (used IV team ultrasound)     Patient tolerated procedure without complications: Yes     Dressing applied: Yes   Comments:     IV team was unable to access so I attempted and obtained access .Central Line  Date/Time: 10/17/2020 5:48 AM Performed by: Fatima Blank, MD Authorized by: Fatima Blank, MD   Consent:    Consent obtained:  Written   Consent given by:  Patient   Risks discussed:  Arterial puncture, bleeding, infection, incorrect placement, nerve damage and pneumothorax   Alternatives discussed:  Delayed treatment Pre-procedure details:    Hand hygiene: Hand hygiene performed prior to insertion     Skin preparation:  2% chlorhexidine   Skin preparation agent:  Skin preparation agent completely dried prior to procedure   Anesthesia (see MAR for exact dosages):    Anesthesia method:  Local infiltration   Local anesthetic:  Lidocaine 1% w/o epi Procedure details:    Location:  R internal jugular   Patient position:  Trendelenburg    Procedural supplies:  Triple lumen   Catheter size:  7 Fr   Landmarks identified: yes     Ultrasound guidance: yes     Sterile ultrasound techniques: Sterile gel and sterile probe covers were used     Number of attempts:  1   Successful placement: yes   Post-procedure details:    Post-procedure:  Dressing applied and line sutured   Assessment:  Blood return through all ports, free fluid flow, no pneumothorax on x-ray and placement verified by x-ray   Patient tolerance of procedure:  Tolerated well, no immediate complications .Critical Care Performed by: Fatima Blank, MD Authorized by: Fatima Blank, MD    CRITICAL CARE Performed by: Grayce Sessions [place] Kirchner Total critical care time: 60 minutes Critical care time was exclusive of separately billable procedures and treating other patients. Critical care was necessary to treat or prevent imminent or life-threatening deterioration. Critical care was time spent personally by me on the following activities: development of treatment plan with patient and/or surrogate as well as nursing, discussions with consultants, evaluation of patient's response to treatment, examination of patient, obtaining history from patient or surrogate, ordering and performing treatments and interventions, ordering and review of laboratory studies, ordering and review of radiographic studies, pulse oximetry and re-evaluation of patient's condition.     (including critical care time)  Medical Decision Making / ED Course I have reviewed the nursing notes for this encounter and the patient's prior records (if available in EHR or on provided paperwork).   TIEN SPOONER was evaluated in Emergency Department on 10/17/2020 for the symptoms described in the history of present illness. He was evaluated in the context of the global COVID-19 pandemic, which necessitated consideration that the patient might be at risk for infection with the SARS-CoV-2  virus that causes COVID-19. Institutional protocols and algorithms that pertain to the evaluation of patients at risk for COVID-19 are in a state of rapid change based on information released by regulatory bodies including the CDC and federal and state organizations. These policies and algorithms were followed during the patient's care in the ED.  Bilateral lower extremity edema with skin changes.  Patient denies any trauma.  Pulses are intact bilaterally and equal.  Not consistent with cellulitis. 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  At this time of night we do not have ultrasound.  We will be obtaining a CT venogram to assess.  Patient was a difficult stick for IV access requiring ultrasound guided IV by myself which infiltrated.  A central line was placed given multiple failed attempts.  Patient requires premedication for IV contrast.  In the interim we will start heparin drip.   Patient care turned over to Dr Theora Gianotti. Patient case and results discussed in detail; please see their note for further ED managment.       Final Clinical Impression(s) / ED Diagnoses Final diagnoses:  Edema      This chart was dictated using voice recognition software.  Despite best efforts to proofread,  errors can occur which can change  the documentation meaning.     Fatima Blank, MD 10/17/20 1943

## 2020-10-17 NOTE — ED Notes (Signed)
Pt IV infiltrated will notify MD to switch to Po meds

## 2020-10-24 ENCOUNTER — Inpatient Hospital Stay (INDEPENDENT_AMBULATORY_CARE_PROVIDER_SITE_OTHER): Payer: Self-pay | Admitting: Primary Care

## 2020-12-05 ENCOUNTER — Other Ambulatory Visit: Payer: Self-pay | Admitting: *Deleted

## 2020-12-05 DIAGNOSIS — M7989 Other specified soft tissue disorders: Secondary | ICD-10-CM

## 2020-12-14 ENCOUNTER — Encounter (HOSPITAL_COMMUNITY): Payer: Self-pay

## 2021-09-24 ENCOUNTER — Ambulatory Visit (HOSPITAL_COMMUNITY): Payer: Self-pay

## 2021-09-25 ENCOUNTER — Ambulatory Visit (HOSPITAL_COMMUNITY): Payer: Self-pay

## 2021-10-04 ENCOUNTER — Ambulatory Visit (HOSPITAL_COMMUNITY): Payer: Self-pay

## 2022-02-07 ENCOUNTER — Ambulatory Visit (HOSPITAL_COMMUNITY)
Admission: RE | Admit: 2022-02-07 | Discharge: 2022-02-07 | Disposition: A | Discharge status: Home / Self Care | Payer: Medicaid Other | Source: Ambulatory Visit | Attending: Family Medicine | Admitting: Family Medicine

## 2022-02-07 ENCOUNTER — Encounter (HOSPITAL_COMMUNITY): Payer: Self-pay

## 2022-02-07 ENCOUNTER — Other Ambulatory Visit: Payer: Self-pay

## 2022-02-07 VITALS — BP 138/96 | HR 82 | Temp 97.8°F | Resp 18

## 2022-02-07 DIAGNOSIS — S81801A Unspecified open wound, right lower leg, initial encounter: Secondary | ICD-10-CM

## 2022-02-07 DIAGNOSIS — S81802A Unspecified open wound, left lower leg, initial encounter: Secondary | ICD-10-CM

## 2022-02-07 MED ORDER — DOXYCYCLINE HYCLATE 100 MG PO CAPS: 100.0000 mg | ORAL_CAPSULE | Freq: Two times a day (BID) | ORAL | 0 refills | Status: DC

## 2022-02-07 NOTE — ED Triage Notes (Signed)
Pt reports hit RLE on dresser drawer in October 2022 and had issues with pain, itching, and some drainage since. Pain worse with weight bearing

## 2022-02-07 NOTE — Discharge Instructions (Addendum)
As we discussed, my formal recommendation is that you go to the emergency room to be evaluated for osteomyelitis which is a deep infection of the bone that if left untreated can lead to amputation, sepsis, and/or death.  I have sent a prescription to the pharmacy for an antibiotic for you to trial, but if you are not noticed improvement over the next 1 to 2 days, or especially if you notice significant worsening, worsening of your pain, worsening of the redness, development of fever, you need to go to the emergency room right away.  In regards to the wound on your left ankle, I recommend compression for this as this can help with the healing.

## 2022-02-07 NOTE — ED Provider Notes (Addendum)
[place]    CSN: [id number] Arrival date & time: 02/07/22  1257      History   Chief Complaint Chief Complaint  Patient presents with   appt 1300   Wound Check    HPI Evan [name] is a 51 y.o. male.   Right Lower Extremity Wound Patient reports in October 2022 he hit the front of his leg on a dresser States that he has had an open wound since that time States that over the past few months he has been trying to get it to heal by trying different things over top of it including Silvadene cream, lidocaine cream, antibiotic ointment He has also tried leaving it uncovered and covered There is been no improvement If anything it has been worsening States over the last few days he has been having significant worsening of pain mostly in the anterior tibia States that he is now having pain with weightbearing He denies any fevers or chills States he is otherwise feeling well Does state that he has chronic dryness as well as skin color changes of his bilateral lower extremities right worse than left  He also reports that he has noticed a small wound appearing on the left medial ankle and is wondering what can be done about this as well He denies any significant pain with this He is not sure how it started He has been treating it similarly to the wound on the right lower extremity  He has a prior history of tibial plateau fracture with infection of hardware He has previously treated metastatic melanoma status postradiation that he is in remission per his report He denies a history of HIV He denies any recent IV drug use   Past Medical History:  Diagnosis Date   ADD (attention deficit disorder) 05/11/2013   ANXIETY 12/10/2007   BACK PAIN 08/03/2009   Chronic pain syndrome 01/28/2014   Depression 06/15/2014   ERECTILE DYSFUNCTION 12/10/2007   Hepatitis C 05/15/2013     "treated and cured"   HYPERTENSION 11/21/2007   INSOMNIA-SLEEP DISORDER-UNSPEC 12/20/2008    Melanoma ([place]) 2015   Lymph nodes left side , radiation   Neuropathy    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   Preventative health care 05/14/2011    Patient Active Problem List   Diagnosis Date Noted   IV drug abuse ([place])    Fracture of right tibial plateau    Septic arthritis of knee, right ([place]) 53/61/4431   Hardware complicating wound infection ([place]) 04/17/2019   Hepatitis C virus infection cured after antiviral drug therapy 04/17/2019   Acute lateral meniscus tear of right knee 01/23/2019   Displaced fracture of right tibial tuberosity, initial encounter for closed fracture 01/16/2019   Closed bicondylar fracture of right tibial plateau 01/15/2019   Cough 10/31/2016   Wheezing 10/31/2016   Encounter for general adult medical examination with abnormal findings 01/27/2016   Depression 06/15/2014   Acute bronchitis 01/28/2014   Chronic pain syndrome 01/28/2014   Hepatitis C 05/15/2013   ADD (attention deficit disorder) 05/11/2013   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   Eczema 12/30/2012   Paronychia 12/30/2012   Metastatic melanoma 10/17/2011   Chest pain 10/17/2011   Preventative health care 05/14/2011   Insomnia 12/20/2008   Anxiety state 12/10/2007   ERECTILE DYSFUNCTION 12/10/2007   Essential hypertension 11/21/2007    Past Surgical History:  Procedure Laterality Date   EXTERNAL FIXATION LEG Right 01/16/2019   Procedure: EXTERNAL FIXATION RIGHT KNEE;  Surgeon: Doreatha Martin,  Thomasene Lot, MD;  Location: [facility];  Service: Orthopedics;  Laterality: Right;   HARDWARE REMOVAL Right 04/17/2019   Procedure: HARDWARE REMOVAL RIGHT TIBIA;  Surgeon: Shona Needles, MD;  Location: [facility];  Service: Orthopedics;  Laterality: Right;   IRRIGATION AND DEBRIDEMENT KNEE Right 04/17/2019   Procedure: IRRIGATION AND DEBRIDEMENT RIGHT KNEE;  Surgeon: Shona Needles, MD;  Location: [facility];  Service: Orthopedics;  Laterality: Right;   LYMPHADENECTOMY Left    ORIF TIBIA PLATEAU  Right 01/20/2019   Procedure: OPEN REDUCTION INTERNAL FIXATION (ORIF) TIBIAL PLATEAU;  Surgeon: Shona Needles, MD;  Location: [facility];  Service: Orthopedics;  Laterality: Right;   SHOULDER SURGERY Left    chronic L dislocating        Home Medications    Prior to Admission medications   Medication Sig Start Date End Date Taking? Authorizing Provider  doxycycline (VIBRAMYCIN) 100 MG capsule Take 1 capsule (100 mg total) by mouth 2 (two) times daily. 02/07/22  Yes Makoa Satz, Bernita Raisin, DO  acetaminophen (TYLENOL) 500 MG tablet Take 1 tablet (500 mg total) by mouth every 12 (twelve) hours. Patient not taking: Reported on 10/17/2020 05/08/19   [name] Ports, PA-C  ALPRAZolam Duanne Moron) 0.5 MG tablet Take 0.5 mg by mouth 3 (three) times daily.     [provider]  amphetamine-dextroamphetamine (ADDERALL) 30 MG tablet Take 1 tablet by mouth 3 (three) times daily. To be filled Mar 01, 2018 02/04/18   Biagio Borg, MD  oxyCODONE (OXY IR/ROXICODONE) 5 MG immediate release tablet Take 10 mg by mouth every 4 (four) hours as needed for severe pain.    [provider]  sildenafil (VIAGRA) 100 MG tablet TAKE ONE TABLET BY MOUTH ONCE A DAY AS NEEDED Patient not taking: Reported on 05/26/2019 01/27/16   Biagio Borg, MD  zolpidem (AMBIEN) 10 MG tablet TAKE ONE TABLET AT BEDTIME AS NEEDED FOR SLEEP Patient taking differently: Take 10 mg by mouth at bedtime.  09/17/17   Biagio Borg, MD    Family History Family History  Problem Relation Age of Onset   Cancer Father        melanoma on back   Cancer Other        pancreatic    Social History Social History   Tobacco Use   Smoking status: Former    Types: Cigarettes    Quit date: 2000    Years since quitting: 23.1   Smokeless tobacco: Never   Tobacco comments:    in college  Vaping Use   Vaping Use: Never used  Substance Use Topics   Alcohol use: Not Currently   Drug use: No     Allergies   Contrast media [iodinated  contrast media] and Fish allergy   Review of Systems Review of Systems  All other systems reviewed and are negative. Per HPI  Physical Exam Triage Vital Signs ED Triage Vitals  Enc Vitals Group     BP 02/07/22 1313 (!) 138/96     Pulse Rate 02/07/22 1313 82     Resp 02/07/22 1313 18     Temp 02/07/22 1313 97.8 F (36.6 C)     Temp Source 02/07/22 1313 Oral     SpO2 02/07/22 1313 95 %     Weight --      Height --      Head Circumference --      Peak Flow --      Pain Score 02/07/22 1312 6  Pain Loc --      Pain Edu? --      Excl. in [place]? --    No data found.  Updated Vital Signs BP (!) 138/96 (BP Location: Right Arm)    Pulse 82    Temp 97.8 F (36.6 C) (Oral)    Resp 18    SpO2 95%   Visual Acuity Right Eye Distance:   Left Eye Distance:   Bilateral Distance:    Right Eye Near:   Left Eye Near:    Bilateral Near:     Physical Exam Constitutional:      General: He is not in acute distress.    Appearance: Normal appearance. He is not ill-appearing or toxic-appearing.  HENT:     Head: Normocephalic and atraumatic.  Eyes:     Conjunctiva/sclera: Conjunctivae normal.  Cardiovascular:     Rate and Rhythm: Normal rate.  Pulmonary:     Effort: Pulmonary effort is normal. No respiratory distress.  Musculoskeletal:     Comments: Right anteriolateral tibia with heart-shaped open wound without any purulent drainage noted.  There is significant surrounding erythema that extends over the anterior tibia.  He has significant tenderness to palpation of the anterior tibia.  He has 2+ dorsalis pedis pulses.  He has chronic stasis dermatitis changes bilaterally.  On the left he has a 0.5 cm circular open wound over the medial malleolus without any surrounding erythema, however there is some chronic hemosiderin deposits noted as well on examination.  Skin:    General: Skin is warm and dry.  Neurological:     Mental Status: He is alert and oriented to person, place, and time.         UC Treatments / Results  Labs (all labs ordered are listed, but only abnormal results are displayed) Labs Reviewed - No data to display  EKG   Radiology No results found.  Procedures Procedures (including critical care time)  Medications Ordered in UC Medications - No data to display  Initial Impression / Assessment and Plan / UC Course  I have reviewed the triage vital signs and the nursing notes.  Pertinent labs & imaging results that were available during my care of the patient were reviewed by me and considered in my medical decision making (see chart for details).     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  Concern for osteomyelitis.  Discussed this with the patient that this cannot be ruled out by any examination or imaging that can be performed today.  Given his significant worsening over the last few days, would recommend evaluation at the emergency department for MRI for possible osteomyelitis.  Discussed that untreated osteomyelitis could lead to amputation, sepsis, ultimately death.  He voices understanding of this, but declines ED evaluation today.  After much discussion, opted to go ahead and trial some doxycycline and have patient keep the area covered to avoid contaminating the wound further.   Very low concern for DVT given anterior boney pain and no swelling of RLE compared to LLE.  Advise if he is not noticing improvement over the next 1 to 2 days, especially if he notices any worsening, spreading of redness, worsening of pain, fever, he needs to go to the emergency room right away.  He voiced understanding is in agreement with the plan.  In regards to his left ulceration, this is likely just a chronic  venous ulcer, recommended compression.  He was discharged home in stable condition.   Final Clinical Impressions(s) / UC Diagnoses    Final diagnoses:  Leg wound, left, initial encounter  Wound of right lower extremity, initial encounter     Discharge Instructions      As we discussed, my formal recommendation is that you go to the emergency room to be evaluated for osteomyelitis which is a deep infection of the bone that if left untreated can lead to amputation, sepsis, and/or death.  I have sent a prescription to the pharmacy for an antibiotic for you to trial, but if you are not noticed improvement over the next 1 to 2 days, or especially if you notice significant worsening, worsening of your pain, worsening of the redness, development of fever, you need to go to the emergency room right away.  In regards to the wound on your left ankle, I recommend compression for this as this can help with the healing.     ED Prescriptions     Medication Sig Dispense Auth. Provider   doxycycline (VIBRAMYCIN) 100 MG capsule Take 1 capsule (100 mg total) by mouth 2 (two) times daily. 20 capsule Zaeda Mcferran, Bernita Raisin, DO      PDMP not reviewed this encounter.   Cleophas Dunker, DO 02/07/22 1345    Oseias Horsey, Bernita Raisin, DO 02/07/22 1349

## 2022-03-26 ENCOUNTER — Observation Stay (HOSPITAL_COMMUNITY): Payer: Medicaid Other

## 2022-03-26 ENCOUNTER — Other Ambulatory Visit: Payer: Self-pay

## 2022-03-26 ENCOUNTER — Inpatient Hospital Stay (HOSPITAL_COMMUNITY)
Admission: EM | Admit: 2022-03-26 | Discharge: 2022-03-30 | DRG: 603 | Disposition: A | Discharge status: Home / Self Care | Payer: Medicaid Other | Attending: Internal Medicine | Admitting: Internal Medicine

## 2022-03-26 ENCOUNTER — Emergency Department (HOSPITAL_COMMUNITY): Payer: Medicaid Other

## 2022-03-26 ENCOUNTER — Encounter (HOSPITAL_COMMUNITY): Payer: Self-pay | Admitting: Emergency Medicine

## 2022-03-26 DIAGNOSIS — M79674 Pain in right toe(s): Secondary | ICD-10-CM

## 2022-03-26 DIAGNOSIS — G4700 Insomnia, unspecified: Secondary | ICD-10-CM | POA: Diagnosis present

## 2022-03-26 DIAGNOSIS — I83002 Varicose veins of unspecified lower extremity with ulcer of calf: Secondary | ICD-10-CM

## 2022-03-26 DIAGNOSIS — Z91041 Radiographic dye allergy status: Secondary | ICD-10-CM

## 2022-03-26 DIAGNOSIS — I77810 Thoracic aortic ectasia: Secondary | ICD-10-CM | POA: Diagnosis present

## 2022-03-26 DIAGNOSIS — F32A Depression, unspecified: Secondary | ICD-10-CM | POA: Diagnosis present

## 2022-03-26 DIAGNOSIS — L03115 Cellulitis of right lower limb: Principal | ICD-10-CM | POA: Diagnosis present

## 2022-03-26 DIAGNOSIS — R55 Syncope and collapse: Secondary | ICD-10-CM

## 2022-03-26 DIAGNOSIS — Z85820 Personal history of malignant melanoma of skin: Secondary | ICD-10-CM

## 2022-03-26 DIAGNOSIS — L97201 Non-pressure chronic ulcer of unspecified calf limited to breakdown of skin: Secondary | ICD-10-CM

## 2022-03-26 DIAGNOSIS — G8929 Other chronic pain: Secondary | ICD-10-CM

## 2022-03-26 DIAGNOSIS — L97811 Non-pressure chronic ulcer of other part of right lower leg limited to breakdown of skin: Secondary | ICD-10-CM | POA: Diagnosis present

## 2022-03-26 DIAGNOSIS — L02416 Cutaneous abscess of left lower limb: Secondary | ICD-10-CM | POA: Diagnosis present

## 2022-03-26 DIAGNOSIS — Z87891 Personal history of nicotine dependence: Secondary | ICD-10-CM

## 2022-03-26 DIAGNOSIS — Z808 Family history of malignant neoplasm of other organs or systems: Secondary | ICD-10-CM

## 2022-03-26 DIAGNOSIS — R0609 Other forms of dyspnea: Secondary | ICD-10-CM

## 2022-03-26 DIAGNOSIS — S81809A Unspecified open wound, unspecified lower leg, initial encounter: Secondary | ICD-10-CM

## 2022-03-26 DIAGNOSIS — G894 Chronic pain syndrome: Secondary | ICD-10-CM | POA: Diagnosis present

## 2022-03-26 DIAGNOSIS — I872 Venous insufficiency (chronic) (peripheral): Secondary | ICD-10-CM | POA: Diagnosis present

## 2022-03-26 DIAGNOSIS — Z79899 Other long term (current) drug therapy: Secondary | ICD-10-CM

## 2022-03-26 DIAGNOSIS — I10 Essential (primary) hypertension: Secondary | ICD-10-CM | POA: Diagnosis present

## 2022-03-26 DIAGNOSIS — E669 Obesity, unspecified: Secondary | ICD-10-CM | POA: Diagnosis present

## 2022-03-26 DIAGNOSIS — C439 Malignant melanoma of skin, unspecified: Secondary | ICD-10-CM

## 2022-03-26 DIAGNOSIS — M2041 Other hammer toe(s) (acquired), right foot: Secondary | ICD-10-CM | POA: Diagnosis present

## 2022-03-26 DIAGNOSIS — N179 Acute kidney failure, unspecified: Secondary | ICD-10-CM

## 2022-03-26 DIAGNOSIS — Z91018 Allergy to other foods: Secondary | ICD-10-CM

## 2022-03-26 DIAGNOSIS — L0390 Cellulitis, unspecified: Secondary | ICD-10-CM | POA: Diagnosis present

## 2022-03-26 DIAGNOSIS — F411 Generalized anxiety disorder: Secondary | ICD-10-CM | POA: Diagnosis present

## 2022-03-26 DIAGNOSIS — I83018 Varicose veins of right lower extremity with ulcer other part of lower leg: Secondary | ICD-10-CM | POA: Diagnosis present

## 2022-03-26 DIAGNOSIS — Z6835 Body mass index (BMI) 35.0-35.9, adult: Secondary | ICD-10-CM

## 2022-03-26 LAB — ECHOCARDIOGRAM COMPLETE
AR max vel: 4.57 cm2
AV Area VTI: 4.98 cm2
AV Area mean vel: 4.56 cm2
AV Mean grad: 2 mmHg
AV Peak grad: 4 mmHg
Ao pk vel: 1 m/s
Area-P 1/2: 3.21 cm2
Calc EF: 47.7 %
Height: 74 in
S' Lateral: 2.6 cm
Single Plane A2C EF: 55.8 %
Single Plane A4C EF: 38.6 %
Weight: 4430.4 oz

## 2022-03-26 LAB — CBC
HCT: 41.3 % (ref 39.0–52.0)
Hemoglobin: 12.9 g/dL — ABNORMAL LOW (ref 13.0–17.0)
MCH: 27.9 pg (ref 26.0–34.0)
MCHC: 31.2 g/dL (ref 30.0–36.0)
MCV: 89.2 fL (ref 80.0–100.0)
Platelets: 328 10*3/uL (ref 150–400)
RBC: 4.63 MIL/uL (ref 4.22–5.81)
RDW: 14.6 % (ref 11.5–15.5)
WBC: 10.9 10*3/uL — ABNORMAL HIGH (ref 4.0–10.5)
nRBC: 0 % (ref 0.0–0.2)

## 2022-03-26 LAB — BASIC METABOLIC PANEL
Anion gap: 10 (ref 5–15)
BUN: 41 mg/dL — ABNORMAL HIGH (ref 6–20)
CO2: 20 mmol/L — ABNORMAL LOW (ref 22–32)
Calcium: 9.2 mg/dL (ref 8.9–10.3)
Chloride: 107 mmol/L (ref 98–111)
Creatinine, Ser: 1.82 mg/dL — ABNORMAL HIGH (ref 0.61–1.24)
GFR, Estimated: 45 mL/min — ABNORMAL LOW (ref >60)
Glucose, Bld: 105 mg/dL — ABNORMAL HIGH (ref 70–99)
Potassium: 4.5 mmol/L (ref 3.5–5.1)
Sodium: 137 mmol/L (ref 135–145)

## 2022-03-26 LAB — URIC ACID: Uric Acid, Serum: 5.6 mg/dL (ref 3.7–8.6)

## 2022-03-26 LAB — D-DIMER, QUANTITATIVE: D-Dimer, Quant: 2.05 ug/mL-FEU — ABNORMAL HIGH (ref 0.00–0.50)

## 2022-03-26 LAB — TROPONIN I (HIGH SENSITIVITY)
Troponin I (High Sensitivity): 8 ng/L (ref <18)
Troponin I (High Sensitivity): 8 ng/L (ref <18)

## 2022-03-26 LAB — TSH: TSH: 2.716 u[IU]/mL (ref 0.350–4.500)

## 2022-03-26 LAB — BRAIN NATRIURETIC PEPTIDE: B Natriuretic Peptide: 27.8 pg/mL (ref 0.0–100.0)

## 2022-03-26 LAB — HIV ANTIBODY (ROUTINE TESTING W REFLEX): HIV Screen 4th Generation wRfx: NONREACTIVE

## 2022-03-26 MED ORDER — ENOXAPARIN SODIUM 40 MG/0.4ML IJ SOSY
40.0000 mg | PREFILLED_SYRINGE | INTRAMUSCULAR | Status: DC
  Administered 2022-03-27: 40 mg via SUBCUTANEOUS
  Filled 2022-03-26 (×2): qty 0.4

## 2022-03-26 MED ORDER — ZOLPIDEM TARTRATE 5 MG PO TABS: 10.0000 mg | ORAL_TABLET | Freq: Every evening | ORAL | Status: DC | PRN

## 2022-03-26 MED ORDER — HYDRALAZINE HCL 20 MG/ML IJ SOLN: 5.0000 mg | Freq: Three times a day (TID) | INTRAMUSCULAR | Status: DC | PRN

## 2022-03-26 MED ORDER — TECHNETIUM TO 99M ALBUMIN AGGREGATED
3.9000 | Freq: Once | INTRAVENOUS | Status: AC | PRN
  Administered 2022-03-26: 3.9 via INTRAVENOUS

## 2022-03-26 MED ORDER — OXYCODONE-ACETAMINOPHEN 5-325 MG PO TABS
1.0000 | ORAL_TABLET | Freq: Once | ORAL | Status: AC
  Administered 2022-03-26: 1 via ORAL
  Filled 2022-03-26: qty 1

## 2022-03-26 MED ORDER — ALPRAZOLAM 0.25 MG PO TABS: 0.5000 mg | ORAL_TABLET | Freq: Two times a day (BID) | ORAL | Status: DC | PRN

## 2022-03-26 MED ORDER — ALPRAZOLAM 0.25 MG PO TABS
0.2500 mg | ORAL_TABLET | Freq: Two times a day (BID) | ORAL | Status: DC | PRN
  Administered 2022-03-26 – 2022-03-28 (×5): 0.25 mg via ORAL
  Filled 2022-03-26 (×6): qty 1

## 2022-03-26 MED ORDER — AMLODIPINE BESYLATE 5 MG PO TABS
5.0000 mg | ORAL_TABLET | Freq: Every day | ORAL | Status: DC
  Administered 2022-03-27 – 2022-03-29 (×3): 5 mg via ORAL
  Filled 2022-03-26 (×4): qty 1

## 2022-03-26 MED ORDER — SODIUM CHLORIDE 0.9 % IV SOLN
2.0000 g | INTRAVENOUS | Status: DC
  Administered 2022-03-26 – 2022-03-28 (×3): 2 g via INTRAVENOUS
  Filled 2022-03-26 (×3): qty 20

## 2022-03-26 MED ORDER — PANTOPRAZOLE SODIUM 40 MG PO TBEC
40.0000 mg | DELAYED_RELEASE_TABLET | Freq: Every day | ORAL | Status: DC
  Administered 2022-03-27 – 2022-03-30 (×4): 40 mg via ORAL
  Filled 2022-03-26 (×5): qty 1

## 2022-03-26 MED ORDER — HYDROCODONE-ACETAMINOPHEN 5-325 MG PO TABS
1.0000 | ORAL_TABLET | ORAL | Status: DC | PRN
  Administered 2022-03-26 – 2022-03-30 (×17): 2 via ORAL
  Filled 2022-03-26 (×18): qty 2

## 2022-03-26 MED ORDER — SODIUM CHLORIDE 0.9 % IV SOLN: INTRAVENOUS | Status: AC

## 2022-03-26 MED ORDER — MORPHINE SULFATE (PF) 2 MG/ML IV SOLN: 1.0000 mg | INTRAVENOUS | Status: DC | PRN

## 2022-03-26 MED ORDER — ZOLPIDEM TARTRATE 5 MG PO TABS
2.5000 mg | ORAL_TABLET | Freq: Once | ORAL | Status: AC
Start: 2022-03-27 — End: 2022-03-27
  Administered 2022-03-27: 2.5 mg via ORAL
  Filled 2022-03-26: qty 1

## 2022-03-26 NOTE — Assessment & Plan Note (Signed)
On no narcotics, pmp website verified no px/fills ?He doesn't want to have to go pain management and doesn't want to have to take opioids.  ?

## 2022-03-26 NOTE — Progress Notes (Signed)
ABI w/ TBI study completed.  ? ?Please see CV Proc for preliminary results.  ? ?Darlin Coco, RDMS, RVT on behalf of Archie Patten, [place] ? ?

## 2022-03-26 NOTE — Progress Notes (Signed)
NEW ADMISSION NOTE ?New Admission Note:  ? ?Arrival Method: ED stretcher  ?Mental Orientation: AAOx4 ?Telemetry: [phone] ?Assessment: Completed ?Skin: To be completed ?IV: LUA ?Pain: 7/10 ?Tubes: n/a ?Safety Measures: Safety Fall Prevention Plan has been given, discussed and signed ?Admission: Completed ?[facility] Orientation: Patient has been orientated to the room, unit and staff.  ?Family: none at bedside ? ?Orders have been reviewed and implemented. Will continue to monitor the patient. Call light has been placed within reach and bed alarm has been activated.  ? ?Vira Agar, RN   ?

## 2022-03-26 NOTE — ED Provider Notes (Signed)
?[facility] ?Provider Note ? ? ?CSN: [id number] ?Arrival date & time: 03/26/22  5701 ? ?  ? ?History ? ?Chief Complaint  ?Patient presents with  ? Shortness of Breath  ? Leg Pain  ? ? ?Evan [name] is a 51 y.o. male. With past medical history of hypertension, hepatitis C treated, who presents to the emergency department with shortness of breath and leg wound. ? ?States he has been short of breath for 4-5 days with light activity.  He describes becoming significantly short of breath with sweating and lightheadedness when getting up to ambulate.  He also endorses multiple episodes of presyncope.  He has never had these symptoms before.  He denies chest pain, palpitations, productive cough or fevers.  Denies worsening lower extremity swelling. ? ?Regarding his leg pain patient states that he has a wound on his right lower leg.  He states that has been there for 5 months after he walked into a cabinet.  He states he never came in to be seen and tried you Steri-Strips and keep it clean at home.  He states that he has had severe worsening pain over the past 3 days "I feel like my leg is on fire."  He also endorses a new wound on the left lower leg that has popped up in the last 3 days. ? ? ?Shortness of Breath ?Associated symptoms: diaphoresis   ?Associated symptoms: no abdominal pain, no chest pain, no cough, no fever, no vomiting and no wheezing   ?Leg Pain ?Associated symptoms: no fever   ? ?  ? ?Home Medications ?Prior to Admission medications   ?Medication Sig Start Date End Date Taking? Authorizing Provider  ?ALPRAZolam (XANAX) 0.5 MG tablet Take 0.5 mg by mouth 3 (three) times daily.    Yes [provider]  ?ibuprofen (ADVIL) 200 MG tablet Take 800-1,000 mg by mouth every 4 (four) hours as needed for headache or moderate pain.   Yes [provider]  ?oxyCODONE (OXY IR/ROXICODONE) 5 MG immediate release tablet Take 10 mg by mouth every 4 (four) hours as  needed for severe pain.   Yes [provider]  ?zolpidem (AMBIEN) 10 MG tablet TAKE ONE TABLET AT BEDTIME AS NEEDED FOR SLEEP ?Patient taking differently: Take 10 mg by mouth at bedtime as needed for sleep. 09/17/17  Yes Biagio Borg, MD  ?acetaminophen (TYLENOL) 500 MG tablet Take 1 tablet (500 mg total) by mouth every 12 (twelve) hours. ?Patient not taking: Reported on 03/26/2022 05/08/19   [name] Ports, PA-C  ?amphetamine-dextroamphetamine (ADDERALL) 30 MG tablet Take 1 tablet by mouth 3 (three) times daily. To be filled Mar 01, 2018 ?Patient not taking: Reported on 03/26/2022 02/04/18   Biagio Borg, MD  ?doxycycline (VIBRAMYCIN) 100 MG capsule Take 1 capsule (100 mg total) by mouth 2 (two) times daily. ?Patient not taking: Reported on 03/26/2022 02/07/22   Meccariello, Bernita Raisin, DO  ?sildenafil (VIAGRA) 100 MG tablet TAKE ONE TABLET BY MOUTH ONCE A DAY AS NEEDED ?Patient not taking: Reported on 05/26/2019 01/27/16   Biagio Borg, MD  ?   ? ?Allergies    ?Contrast media [iodinated contrast media] and Fish allergy   ? ?Review of Systems   ?Review of Systems  ?Constitutional:  Positive for diaphoresis. Negative for fever.  ?Respiratory:  Positive for shortness of breath. Negative for cough, wheezing and stridor.   ?Cardiovascular:  Positive for leg swelling. Negative for chest pain and palpitations.  ?Gastrointestinal:  Negative for abdominal pain, nausea and vomiting.  ?Skin:  Positive for wound.  ?Neurological:  Positive for dizziness and light-headedness. Negative for syncope.  ?All other systems reviewed and are negative. ? ?Physical Exam ?Updated Vital Signs ?BP (!) 157/138 (BP Location: Right Arm)   Pulse (!) 108   Temp 98.4 ?F (36.9 ?C) (Oral)   Resp 17   SpO2 100%  ?Physical Exam ?Vitals and nursing note reviewed.  ?Constitutional:   ?   General: He is not in acute distress. ?   Appearance: He is well-developed. He is ill-appearing. He is not toxic-appearing.  ?HENT:  ?   Head: Normocephalic and  atraumatic.  ?   Nose: Nose normal.  ?   Mouth/Throat:  ?   Mouth: Mucous membranes are moist.  ?Eyes:  ?   General: No scleral icterus. ?   Extraocular Movements: Extraocular movements intact.  ?   Pupils: Pupils are equal, round, and reactive to light.  ?Neck:  ?   Vascular: No JVD.  ?Cardiovascular:  ?   Rate and Rhythm: Normal rate and regular rhythm.  ?   Pulses:     ?     Radial pulses are 2+ on the right side and 2+ on the left side.  ?     Dorsalis pedis pulses are 1+ on the right side and 1+ on the left side.  ?   Heart sounds: Normal heart sounds. No murmur heard. ?Pulmonary:  ?   Effort: Pulmonary effort is normal. No tachypnea or respiratory distress.  ?   Breath sounds: Normal breath sounds. No wheezing, rhonchi or rales.  ?Chest:  ?   Chest wall: No tenderness.  ?Abdominal:  ?   General: There is no distension.  ?   Palpations: Abdomen is soft.  ?   Tenderness: There is no abdominal tenderness.  ?Musculoskeletal:     ?   General: Tenderness present. Normal range of motion.  ?   Cervical back: Normal range of motion and neck supple.  ?   Right lower leg: Tenderness present. Edema present.  ?   Left lower leg: Tenderness present. Edema present.  ?Skin: ?   General: Skin is warm.  ?   Capillary Refill: Capillary refill takes less than 2 seconds.  ?   Comments: 3 cm wound to the right anterior shin just proximal to the right ankle.  Surrounding erythema.  Chronic venous stasis changes to the skin ? ?Left lower extremity with new wound on the left anterior shin just proximal to the ankle   ?Neurological:  ?   General: No focal deficit present.  ?   Mental Status: He is alert and oriented to person, place, and time. Mental status is at baseline.  ?Psychiatric:     ?   Mood and Affect: Mood normal.     ?   Behavior: Behavior normal.     ?   Thought Content: Thought content normal.     ?   Judgment: Judgment normal.  ? ? ?ED Results / Procedures / Treatments   ?Labs ?(all labs ordered are listed, but only  abnormal results are displayed) ?Labs Reviewed  ?BASIC METABOLIC PANEL - Abnormal; Notable for the following components:  ?    Result Value  ? CO2 20 (*)   ? Glucose, Bld 105 (*)   ? BUN 41 (*)   ? Creatinine, Ser 1.82 (*)   ? GFR, Estimated 45 (*)   ? All other components within normal  limits  ?CBC - Abnormal; Notable for the following components:  ? WBC 10.9 (*)   ? Hemoglobin 12.9 (*)   ? All other components within normal limits  ?BRAIN NATRIURETIC PEPTIDE  ?TROPONIN I (HIGH SENSITIVITY)  ? ?EKG ?EKG Interpretation ? ?Date/Time:  Monday March 26 2022 05:31:51 EDT ?Ventricular Rate:  104 ?PR Interval:  152 ?QRS Duration: 78 ?QT Interval:  350 ?QTC Calculation: 460 ?R Axis:   19 ?Text Interpretation: Sinus tachycardia Minimal voltage criteria for LVH, may be normal variant ( R in aVL ) Borderline ECG When compared with ECG of 17-Apr-2019 08:13, PREVIOUS ECG IS PRESENT Confirmed by Regan Lemming ([id number]) on 03/26/2022 8:44:41 AM ? ?Radiology ?DG Chest 2 View ? ?Result Date: 03/26/2022 ?CLINICAL DATA:  51 year old male with shortness of breath. History of metastatic melanoma. EXAM: CHEST - 2 VIEW COMPARISON:  Portable chest 10/17/2020 and earlier. FINDINGS: PA and lateral views at 0548 hours. Chronic increased AP dimension to the chest resulting in somewhat shallow appearing lung volumes on the PA. Normal cardiac size and mediastinal contours. Visualized tracheal air column is within normal limits. No pneumothorax, pulmonary edema, pleural effusion or confluent pulmonary opacity. Exaggerated thoracic kyphosis. Multiple right axillary surgical clips are chronic. No acute osseous abnormality identified. Negative visible bowel gas. IMPRESSION: No acute cardiopulmonary abnormality. Electronically Signed   By: Genevie Ann M.D.   On: 03/26/2022 06:37   ? ?Procedures ?Procedures  ? ? ?Medications Ordered in ED ?Medications  ?oxyCODONE-acetaminophen (PERCOCET/ROXICET) 5-325 MG per tablet 1 tablet (has no administration in time range)   ? ? ?ED Course/ Medical Decision Making/ A&P ?  ?                        ?Medical Decision Making ?Amount and/or Complexity of Data Reviewed ?Labs: ordered. ?Radiology: ordered. ? ?Risk ?Prescription drug managem

## 2022-03-26 NOTE — Significant Event (Signed)
Paged by  nurse that patient was all of a sudden very lethargic after stepping out of room and minimally responsive to sternal rubs. Vitals all stable. When I arrived he was easily awoken and alert and oriented and answering questions and in no distress. Behavior slightly off.  ?-discussed with nurse im cancelling IV morphine for pain. Will only have PO norco and small dose of xanax as needed. Discussed with patient that if he is this sleepy, he is not anxious or in need of xanax. He agrees.  ?Waiting on urine for UDS and urine studies.  ?Discussed plan with nurse.  ?Orma Flaming, MD ? ? ?

## 2022-03-26 NOTE — Assessment & Plan Note (Addendum)
Untreated, used to be on avapro and then lost weight  ?Home readings typically 160 or greater systolic ?Start '5mg'$  norvasc daily and monitor. Hctz/ARB/ACE-I contraindicated with AKI right now  ?Prn hydralazine if needed  ?

## 2022-03-26 NOTE — ED Triage Notes (Signed)
Pt reported to ED with c/o SHOB x 4-5 days that worsens with exertion. Denies hx f CHF by states he has been having pain to wound on left leg that has been causing him insomnia. ?

## 2022-03-26 NOTE — Assessment & Plan Note (Addendum)
Continue xanax has not been taking but is very nervous being here in hospital ?Will do xanax .'25mg'$  Bid PRN  ?

## 2022-03-26 NOTE — H&P (Addendum)
?History and Physical  ? ? ?Patient: Evan [name] [id number] DOB: 01-03-1971 ?DOA: 03/26/2022 ?DOS: the patient was seen and examined on 03/26/2022 ?PCP: Pcp, No  ?Patient coming from: Home - lives with roommate  ? ? ?Chief Complaint: shortness of breath/Leg wound  ? ?HPI: Evan [name] is a 51 y.o. male with medical history significant of HTN, treated hepatitis C, ADD, anxiety and depression, chronic pain, insomnia, hx of melanoma with complaints of shortness of breath/dyspnea on exertion. He states he noticed he has been short of breath since last weekend. With the smallest amount of activity he gets short of breat, diaphoretic and feels like he is going to pass out.  He gets lightheaded and dizzy and has to lay down immediately. He states it usually starts after 5 minutes of activity. He denies any orthopnea, weight gain, cough.  ? ?He also has bilateral leg wounds. He states his left ankle wound got bad just last week. He denies any trauma. He has had increased leg swelling, but this has resolved about 1 week ago. Since being at the beach and driving back from the beach. He has had a wound one on his right lower leg for >5 months after he hit a piece of furniture. He states the wound on his right lower leg has become more tender with surrounding redness. It is raw with drainage with serosanguinous drainage.  He also states the color of his feet change and look black at times. He doesn't think they get cold. He denies any claudication type symptoms.  ? ?He has been taking NSAIDs and can take up to 7 ibuprofen at a time. Typically takes 6 pills every 6 hours due to the pain of his leg wounds.  ? ? Denies any fever/chills, vision changes/headaches, chest pain or palpitations, no cough, abdominal pain, N/V/D, dysuria.  ? ?No smoking or drinking.  ? ? ?ER Course:  vitals: afebrile, bp: 157/138, HR: 108, RR: 17, oxygen: 100%RA ?Pertinent labs: wbc: 10.9, creatinine: 1.82, BUN: 41, d-dimer: 2.05 ?CXR: no  acute finding  ?V/Q scan: low probability for PE  ?In ED: given percocet.  ? ? ? ?Review of Systems: As mentioned in the history of present illness. All other systems reviewed and are negative. ?Past Medical History:  ?Diagnosis Date  ? ADD (attention deficit disorder) 05/11/2013  ? ANXIETY 12/10/2007  ? BACK PAIN 08/03/2009  ? Chronic pain syndrome 01/28/2014  ? Depression 06/15/2014  ? ERECTILE DYSFUNCTION 12/10/2007  ? Hepatitis C 05/15/2013  ?   "treated and cured"  ? HYPERTENSION 11/21/2007  ? INSOMNIA-SLEEP DISORDER-UNSPEC 12/20/2008  ? Melanoma ([place]) 2015  ? Lymph nodes left side , radiation  ? Neuropathy   ? 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 Preventative health care 05/14/2011  ? ?Past Surgical History:  ?Procedure Laterality Date  ? EXTERNAL FIXATION LEG Right 01/16/2019  ? Procedure: EXTERNAL FIXATION RIGHT KNEE;  Surgeon: Shona Needles, MD;  Location: [facility];  Service: Orthopedics;  Laterality: Right;  ? HARDWARE REMOVAL Right 04/17/2019  ? Procedure: HARDWARE REMOVAL RIGHT TIBIA;  Surgeon: Shona Needles, MD;  Location: [facility];  Service: Orthopedics;  Laterality: Right;  ? IRRIGATION AND DEBRIDEMENT KNEE Right 04/17/2019  ? Procedure: IRRIGATION AND DEBRIDEMENT RIGHT KNEE;  Surgeon: Shona Needles, MD;  Location: [facility];  Service: Orthopedics;  Laterality: Right;  ? LYMPHADENECTOMY Left   ? ORIF TIBIA PLATEAU Right 01/20/2019  ? Procedure: OPEN REDUCTION INTERNAL FIXATION (ORIF) TIBIAL PLATEAU;  Surgeon: Haddix,  Thomasene Lot, MD;  Location: [facility];  Service: Orthopedics;  Laterality: Right;  ? SHOULDER SURGERY Left   ? chronic L dislocating   ? ?Social History:  reports that he quit smoking about 23 years ago. He has never used smokeless tobacco. He reports that he does not currently use alcohol. He reports that he does not use drugs. ? ?Allergies  ?Allergen Reactions  ? Contrast Media [Iodinated Contrast Media] Shortness Of Breath and Swelling  ?  Shortness of breath., throat swelling  ? Fish Allergy  Anaphylaxis  ?  No reaction to shellfish  ? ? ?Family History  ?Problem Relation Age of Onset  ? Cancer Father   ?     melanoma on back  ? Cancer Other   ?     pancreatic  ? ? ?Prior to Admission medications   ?Medication Sig Start Date End Date Taking? Authorizing Provider  ?ALPRAZolam (XANAX) 0.5 MG tablet Take 0.5 mg by mouth 3 (three) times daily.    Yes [provider]  ?ibuprofen (ADVIL) 200 MG tablet Take 800-1,000 mg by mouth every 4 (four) hours as needed for headache or moderate pain.   Yes [provider]  ?oxyCODONE (OXY IR/ROXICODONE) 5 MG immediate release tablet Take 10 mg by mouth every 4 (four) hours as needed for severe pain.   Yes [provider]  ?zolpidem (AMBIEN) 10 MG tablet TAKE ONE TABLET AT BEDTIME AS NEEDED FOR SLEEP ?Patient taking differently: Take 10 mg by mouth at bedtime as needed for sleep. 09/17/17  Yes Biagio Borg, MD  ?acetaminophen (TYLENOL) 500 MG tablet Take 1 tablet (500 mg total) by mouth every 12 (twelve) hours. ?Patient not taking: Reported on 03/26/2022 05/08/19   [name] Ports, PA-C  ?amphetamine-dextroamphetamine (ADDERALL) 30 MG tablet Take 1 tablet by mouth 3 (three) times daily. To be filled Mar 01, 2018 ?Patient not taking: Reported on 03/26/2022 02/04/18   Biagio Borg, MD  ?doxycycline (VIBRAMYCIN) 100 MG capsule Take 1 capsule (100 mg total) by mouth 2 (two) times daily. ?Patient not taking: Reported on 03/26/2022 02/07/22   Meccariello, Bernita Raisin, DO  ?sildenafil (VIAGRA) 100 MG tablet TAKE ONE TABLET BY MOUTH ONCE A DAY AS NEEDED ?Patient not taking: Reported on 05/26/2019 01/27/16   Biagio Borg, MD  ? ? ?Physical Exam: ?Vitals:  ? 03/26/22 1024 03/26/22 1115 03/26/22 1145 03/26/22 1506  ?BP: (!) 116/96 (!) 143/101 (!) 134/96 (!) 153/102  ?Pulse: 77 81 71 71  ?Resp: '14 19 12   '$ ?Temp:    98.1 ?F (36.7 ?C)  ?TempSrc:    Oral  ?SpO2: 100% 98% 100% 100%  ?Weight:    125.6 kg  ?Height:    '6\' 2"'$  (1.88 m)  ? ?General:  Appears calm and comfortable  and is in NAD ?Eyes:  PERRL, EOMI, normal lids, iris ?ENT:  grossly normal hearing, lips & tongue, mmm; appropriate dentition ?Neck:  no LAD, masses or thyromegaly; no carotid bruits ?Cardiovascular:  RRR, no m/r/g. Trace LE edema.  ?Respiratory:   CTA bilaterally with no wheezes/rales/rhonchi.  Normal respiratory effort. ?Abdomen:  soft, NT, ND, NABS ?Back:   normal alignment, no CVAT ?Skin:  bilateral LE wounds on right lower leg and left medial ankle. Right wound open and raw with surrounding erythema and warmth to touch. Left medial ankle with scab and minimal surrounding erythema. See pics below.  ? ? ? ? ?Musculoskeletal:  grossly normal tone BUE/BLE, good ROM, no bony abnormality ?Lower extremity:  trace LE edema.  Limited foot exam with no ulcerations.  Non palpable pedal pulses. Purple/blue discoloration of toes. Feet warm to touch, but toes cooler.  ?Psychiatric:  grossly normal mood and affect, speech fluent and appropriate, AOx3 ?Neurologic:  CN 2-12 grossly intact, moves all extremities in coordinated fashion, sensation intact ? ? ?Radiological Exams on Admission: ?Independently reviewed - see discussion in A/P where applicable ? ?DG Chest 2 View ? ?Result Date: 03/26/2022 ?CLINICAL DATA:  51 year old male with shortness of breath. History of metastatic melanoma. EXAM: CHEST - 2 VIEW COMPARISON:  Portable chest 10/17/2020 and earlier. FINDINGS: PA and lateral views at 0548 hours. Chronic increased AP dimension to the chest resulting in somewhat shallow appearing lung volumes on the PA. Normal cardiac size and mediastinal contours. Visualized tracheal air column is within normal limits. No pneumothorax, pulmonary edema, pleural effusion or confluent pulmonary opacity. Exaggerated thoracic kyphosis. Multiple right axillary surgical clips are chronic. No acute osseous abnormality identified. Negative visible bowel gas. IMPRESSION: No acute cardiopulmonary abnormality. Electronically Signed   By: Genevie Ann M.D.    On: 03/26/2022 06:37  ? ?NM Pulmonary Perfusion ? ?Result Date: 03/26/2022 ?CLINICAL DATA:  Shortness of breath EXAM: NUCLEAR MEDICINE PERFUSION LUNG SCAN TECHNIQUE: Perfusion images were obtained in mult

## 2022-03-26 NOTE — Assessment & Plan Note (Signed)
Labs from one year ago show creatinine of 1.28 and then normal before this ?Creatinine today of 1.82 and BUN of 44 ?Likely due to excessive NSAID use ('1200mg'$  q6 hours regularly and at times '1400mg'$ ) ?UA pending/urine studies.  ?Strict I/O ?Avoid nephrotoxic drugs ?Gentle IVF ?Also starting protonix '40mg'$  daily with 1 episode of reflux and chronic NSAID use. Discussed with him this is excessive amounts of NSAIDs and can damage kidneys, have GI bleed and cause permanent organ damage.  ?

## 2022-03-26 NOTE — ED Notes (Signed)
Attempted to start PIV, unsuccessful. Going to Nuc med now, informed Nuc med tech that pt has no line. Tech acknowledged and states they will try and or call IV team. ?

## 2022-03-26 NOTE — Assessment & Plan Note (Addendum)
Concern for vascular/arterial disease with color change to feet/pain and nonpalpable pedal pulses vs. Venous stasis ulcers.  ?ABI has been ordered ?Right leg wound with cellulitic appearance ?Check blood cultures ?Start rocephin  ?Pain control with norco and morphine  ?UDS pending, past history of IV drug abuse but remote and denies any current use.  ?Wound care consult  ?

## 2022-03-26 NOTE — Plan of Care (Signed)
?  Problem: Clinical Measurements: ?Goal: Ability to avoid or minimize complications of infection will improve ?Outcome: Progressing ?  ?Problem: Skin Integrity: ?Goal: Skin integrity will improve ?Outcome: Progressing ?  ?

## 2022-03-26 NOTE — Assessment & Plan Note (Signed)
51 year old presenting with one week history of dyspnea on exertion associated with presyncopal symptoms of dizziness/lightheadedness ?-obs to tele ?-BNP and CXR uneventful ?-d-dimer elevated with VQ scan low probability of PE ?-co2 20, likely secondary to AKI, montior ?-check echo ?-check orthostatic vitals  ?

## 2022-03-26 NOTE — Assessment & Plan Note (Signed)
Chronic, has some erythema/eccymosis with vesicles on distal aspect of toe. Concern for vascular disease  ?No erythema/edema ?Check uric acid and xray ? ?

## 2022-03-27 ENCOUNTER — Inpatient Hospital Stay (HOSPITAL_COMMUNITY): Payer: Medicaid Other

## 2022-03-27 ENCOUNTER — Observation Stay (HOSPITAL_COMMUNITY): Payer: Medicaid Other

## 2022-03-27 DIAGNOSIS — Z85820 Personal history of malignant melanoma of skin: Secondary | ICD-10-CM | POA: Diagnosis not present

## 2022-03-27 DIAGNOSIS — L03119 Cellulitis of unspecified part of limb: Secondary | ICD-10-CM | POA: Diagnosis not present

## 2022-03-27 DIAGNOSIS — L02416 Cutaneous abscess of left lower limb: Secondary | ICD-10-CM | POA: Diagnosis present

## 2022-03-27 DIAGNOSIS — I872 Venous insufficiency (chronic) (peripheral): Secondary | ICD-10-CM | POA: Diagnosis present

## 2022-03-27 DIAGNOSIS — M7989 Other specified soft tissue disorders: Secondary | ICD-10-CM

## 2022-03-27 DIAGNOSIS — Z91041 Radiographic dye allergy status: Secondary | ICD-10-CM | POA: Diagnosis not present

## 2022-03-27 DIAGNOSIS — L97811 Non-pressure chronic ulcer of other part of right lower leg limited to breakdown of skin: Secondary | ICD-10-CM | POA: Diagnosis present

## 2022-03-27 DIAGNOSIS — N179 Acute kidney failure, unspecified: Secondary | ICD-10-CM | POA: Diagnosis present

## 2022-03-27 DIAGNOSIS — Z91018 Allergy to other foods: Secondary | ICD-10-CM | POA: Diagnosis not present

## 2022-03-27 DIAGNOSIS — E669 Obesity, unspecified: Secondary | ICD-10-CM | POA: Diagnosis present

## 2022-03-27 DIAGNOSIS — I10 Essential (primary) hypertension: Secondary | ICD-10-CM | POA: Diagnosis present

## 2022-03-27 DIAGNOSIS — F411 Generalized anxiety disorder: Secondary | ICD-10-CM | POA: Diagnosis present

## 2022-03-27 DIAGNOSIS — F32A Depression, unspecified: Secondary | ICD-10-CM | POA: Diagnosis present

## 2022-03-27 DIAGNOSIS — Z79899 Other long term (current) drug therapy: Secondary | ICD-10-CM | POA: Diagnosis not present

## 2022-03-27 DIAGNOSIS — S81809A Unspecified open wound, unspecified lower leg, initial encounter: Secondary | ICD-10-CM | POA: Diagnosis not present

## 2022-03-27 DIAGNOSIS — L0390 Cellulitis, unspecified: Secondary | ICD-10-CM | POA: Diagnosis present

## 2022-03-27 DIAGNOSIS — R55 Syncope and collapse: Secondary | ICD-10-CM | POA: Diagnosis not present

## 2022-03-27 DIAGNOSIS — Z87891 Personal history of nicotine dependence: Secondary | ICD-10-CM | POA: Diagnosis not present

## 2022-03-27 DIAGNOSIS — L97201 Non-pressure chronic ulcer of unspecified calf limited to breakdown of skin: Secondary | ICD-10-CM | POA: Diagnosis not present

## 2022-03-27 DIAGNOSIS — L03115 Cellulitis of right lower limb: Secondary | ICD-10-CM | POA: Diagnosis not present

## 2022-03-27 DIAGNOSIS — I83018 Varicose veins of right lower extremity with ulcer other part of lower leg: Secondary | ICD-10-CM | POA: Diagnosis present

## 2022-03-27 DIAGNOSIS — Z6835 Body mass index (BMI) 35.0-35.9, adult: Secondary | ICD-10-CM | POA: Diagnosis not present

## 2022-03-27 DIAGNOSIS — G894 Chronic pain syndrome: Secondary | ICD-10-CM | POA: Diagnosis present

## 2022-03-27 DIAGNOSIS — G4700 Insomnia, unspecified: Secondary | ICD-10-CM | POA: Diagnosis present

## 2022-03-27 DIAGNOSIS — Z808 Family history of malignant neoplasm of other organs or systems: Secondary | ICD-10-CM | POA: Diagnosis not present

## 2022-03-27 DIAGNOSIS — I77810 Thoracic aortic ectasia: Secondary | ICD-10-CM | POA: Diagnosis present

## 2022-03-27 DIAGNOSIS — I83002 Varicose veins of unspecified lower extremity with ulcer of calf: Secondary | ICD-10-CM | POA: Diagnosis not present

## 2022-03-27 DIAGNOSIS — R0609 Other forms of dyspnea: Secondary | ICD-10-CM | POA: Diagnosis not present

## 2022-03-27 DIAGNOSIS — M2041 Other hammer toe(s) (acquired), right foot: Secondary | ICD-10-CM | POA: Diagnosis present

## 2022-03-27 LAB — CBC
HCT: 32.6 % — ABNORMAL LOW (ref 39.0–52.0)
Hemoglobin: 10.4 g/dL — ABNORMAL LOW (ref 13.0–17.0)
MCH: 27.6 pg (ref 26.0–34.0)
MCHC: 31.9 g/dL (ref 30.0–36.0)
MCV: 86.5 fL (ref 80.0–100.0)
Platelets: 236 10*3/uL (ref 150–400)
RBC: 3.77 MIL/uL — ABNORMAL LOW (ref 4.22–5.81)
RDW: 14.5 % (ref 11.5–15.5)
WBC: 5.6 10*3/uL (ref 4.0–10.5)
nRBC: 0 % (ref 0.0–0.2)

## 2022-03-27 LAB — RAPID URINE DRUG SCREEN, HOSP PERFORMED
Amphetamines: POSITIVE — AB
Barbiturates: NOT DETECTED
Benzodiazepines: NOT DETECTED
Cocaine: NOT DETECTED
Opiates: POSITIVE — AB
Tetrahydrocannabinol: NOT DETECTED

## 2022-03-27 LAB — URINALYSIS, ROUTINE W REFLEX MICROSCOPIC
Bilirubin Urine: NEGATIVE
Glucose, UA: NEGATIVE mg/dL
Hgb urine dipstick: NEGATIVE
Ketones, ur: NEGATIVE mg/dL
Leukocytes,Ua: NEGATIVE
Nitrite: NEGATIVE
Protein, ur: NEGATIVE mg/dL
Specific Gravity, Urine: 1.017 (ref 1.005–1.030)
pH: 6 (ref 5.0–8.0)

## 2022-03-27 LAB — BASIC METABOLIC PANEL
Anion gap: 7 (ref 5–15)
BUN: 32 mg/dL — ABNORMAL HIGH (ref 6–20)
CO2: 21 mmol/L — ABNORMAL LOW (ref 22–32)
Calcium: 8.5 mg/dL — ABNORMAL LOW (ref 8.9–10.3)
Chloride: 106 mmol/L (ref 98–111)
Creatinine, Ser: 1.17 mg/dL (ref 0.61–1.24)
GFR, Estimated: >60 mL/min (ref >60)
Glucose, Bld: 93 mg/dL (ref 70–99)
Potassium: 4.2 mmol/L (ref 3.5–5.1)
Sodium: 134 mmol/L — ABNORMAL LOW (ref 135–145)

## 2022-03-27 LAB — SODIUM, URINE, RANDOM: Sodium, Ur: <10 mmol/L

## 2022-03-27 LAB — CREATININE, URINE, RANDOM: Creatinine, Urine: 138.85 mg/dL

## 2022-03-27 MED ORDER — NALOXONE HCL 0.4 MG/ML IJ SOLN
0.4000 mg | INTRAMUSCULAR | Status: DC | PRN
  Filled 2022-03-27: qty 1

## 2022-03-27 MED ORDER — GADOBUTROL 1 MMOL/ML IV SOLN
10.0000 mL | Freq: Once | INTRAVENOUS | Status: AC | PRN
  Administered 2022-03-27: 10 mL via INTRAVENOUS

## 2022-03-27 MED ORDER — MEDIHONEY WOUND/BURN DRESSING EX PSTE
1.0000 "application " | PASTE | Freq: Every day | CUTANEOUS | Status: DC
  Administered 2022-03-27 – 2022-03-28 (×2): 1 via TOPICAL
  Filled 2022-03-27 (×2): qty 44

## 2022-03-27 MED ORDER — VANCOMYCIN HCL 1250 MG/250ML IV SOLN
1250.0000 mg | Freq: Two times a day (BID) | INTRAVENOUS | Status: DC
  Administered 2022-03-27: 1250 mg via INTRAVENOUS
  Filled 2022-03-27 (×2): qty 250

## 2022-03-27 NOTE — Progress Notes (Addendum)
This RN rounded with the MD and found an insulin syringe with needle exposed in room on the floor. MD placed needle in sharps container.  Charge nurse made aware along with AD.  ?

## 2022-03-27 NOTE — Consult Note (Addendum)
WOC Nurse Consult Note: ?Reason for Consult: Consult requested for bilat legs.  Performed remotely after review of progress notes and photos in the EMR.  ABI results were within normal limits.  ?Left ankle with dry dark-colored scabbed full thickness wound. ?Right leg with full thickness wound, red and moist interspersed throughout with darker-colored woundbed. ?Dressing procedure/placement/frequency: Topical treatment orders provided for bedside nurses to perform as follows to assist with removal of nonviable tissue and promote healing:  ?Apply Medihoney to left and right leg wounds Q day, then cover with foam dressing.  (Change foam dressing Q 3 days or PRN soiling.) ?Pt could benefit from follow-up at the outpatient wound care center after discharge.This must be by physician referral; primary team; please order if you agree.  ?Please re-consult if further assistance is needed.  Thank-you,  ?Julien Girt MSN, RN, [place], [place], CNS ?[phone]  ?  ?

## 2022-03-27 NOTE — Consult Note (Signed)
? ? ? ?[facility]   ? ?Date of Admission:  03/26/2022    ? ?Total days of antibiotics 2 ?        ?      ?Reason for Consult: Wound Infection / Hx of Hardware infection  ?Referring Provider: Dr. Cruzita Lederer ?Primary Care Provider: Pcp, No ? ? ?ASSESSMENT: ? ?Evan [name] is a 51 y/o male with history of hardware infection and septic arthritis in 2020 admitted with shortness of breath and multiple leg wounds. Imaging without any evidence of osteomyelitis and awaiting MRI.  Currently on ceftriaxone and will add vancomycin. Therapeutic drug monitoring of renal function and vancomycin levels. Negative for PE and DVT with shortness of breath appearing to be improved.  Duration and route of treatment to be determined by MRI results. Continue wound care per [facility] RN recommendations. With history of Hepatitis C will check RNA level. Remaining medical and supportive care per primary team.  ? ?PLAN: ? ?Start vancomycin and continue current dose of ceftriaxone. ?Therapeutic drug monitoring of renal function and vancomycin levels.  ?Await results of MRI ?Wound care per [facility] RN recommendations.  ?Check Hepatitis C RNA ?Remaining medical and supportive care per primary team.  ? ? ?Principal Problem: ?  Dyspnea on exertion ?Active Problems: ?  Anxiety state ?  Essential hypertension ?  Chronic pain syndrome ?  AKI (acute kidney injury) ([place]) ?  Multiple open wounds of lower leg ?  Great toe pain, right ?  Cellulitis ? ? ? amLODipine  5 mg Oral Daily  ? enoxaparin (LOVENOX) injection  40 mg Subcutaneous Q24H  ? leptospermum manuka honey  1 application. Topical Daily  ? pantoprazole  40 mg Oral Daily  ? ? ? ?HPI: Evan [name] is a 51 y.o. male with previous history as detailed below and significant for chronic pain syndrome, hepatitis C s/p treatment, and previous hardware infection and septic arthritis of the right knee admitted with shortness of breath/dyspnea on exertion and bilateral leg wounds.  ? ?Evan [name] has been having shortness of breath started about 4-5 days prior to arrival and worsened with ambulation. Right leg wound had been present for about 5 months following walking into a cabinet and attempted basic wound care at home. Leg pain worsened over previous 3 days with feelings that his "leg is on fire."  Chest x-ray with no acute cardiopulmonary abnormality. Low probability for pulmonary embolism on perfusion lung scan. Right foot x-ray with osteoarthritis of the first metatarsal phalangeal joint and no acute findings. Right knee x-ray with no acute osseous abnormalities and healed tibial plateau and fibular head fractures. Awaiting MRI of the left ankle and right tibia/fibula. ABI with no arterial vascular disease and dopplers with no evidence of DVT bilaterally.  ? ?Evan [name] has been afebrile since admission without leukocytosis.Blood cultures drawn on admission have been without growth to date. Currenlty on Day 2 of Ceftriaxone. Initially with acute kidney injury with creatinine of 1.82 and today improved to 1.17. Urine drug screen was positive for amphetamines and opiates. WOC RN consulted for wound care recommendations. Previously seen at Urgent Care on 02/07/22 and prescribed doxycycline for 10 days. Pain has progressively worsened over the past few days which has brought him to the hospital.  ? ?Review of Systems: ?Review of Systems  ?Constitutional:  Negative for chills, fever and weight loss.  ?Respiratory:  Negative for cough, shortness of breath and wheezing.   ?Cardiovascular:  Negative for chest pain and  leg swelling.  ?Gastrointestinal:  Negative for abdominal pain, constipation, diarrhea, nausea and vomiting.  ?Skin:  Negative for rash.  ? ? ?Past Medical History:  ?Diagnosis Date  ? ADD (attention deficit disorder) 05/11/2013  ? ANXIETY 12/10/2007  ? BACK PAIN 08/03/2009  ? Chronic pain syndrome 01/28/2014  ? Depression 06/15/2014  ? ERECTILE DYSFUNCTION 12/10/2007  ? Hepatitis C 05/15/2013   ?   "treated and cured"  ? HYPERTENSION 11/21/2007  ? INSOMNIA-SLEEP DISORDER-UNSPEC 12/20/2008  ? Melanoma ([place]) 2015  ? Lymph nodes left side , radiation  ? Neuropathy   ? 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 Preventative health care 05/14/2011  ? ? ?Social History  ? ?Tobacco Use  ? Smoking status: Former  ?  Types: Cigarettes  ?  Quit date: 2000  ?  Years since quitting: 23.2  ? Smokeless tobacco: Never  ? Tobacco comments:  ?  in college  ?Vaping Use  ? Vaping Use: Never used  ?Substance Use Topics  ? Alcohol use: Not Currently  ? Drug use: No  ? ? ?Family History  ?Problem Relation Age of Onset  ? Cancer Father   ?     melanoma on back  ? Cancer Other   ?     pancreatic  ? ? ?Allergies  ?Allergen Reactions  ? Contrast Media [Iodinated Contrast Media] Shortness Of Breath and Swelling  ?  Shortness of breath., throat swelling  ? Fish Allergy Anaphylaxis  ?  No reaction to shellfish  ? ? ?OBJECTIVE: ?Blood pressure 127/73, pulse 69, temperature (!) 97.5 ?F (36.4 ?C), resp. rate 18, height '6\' 2"'$  (1.88 m), weight 125.6 kg, SpO2 100 %. ? ?Physical Exam ?Constitutional:   ?   General: He is not in acute distress. ?   Appearance: He is well-developed.  ?Cardiovascular:  ?   Rate and Rhythm: Normal rate and regular rhythm.  ?   Heart sounds: Normal heart sounds.  ?Pulmonary:  ?   Effort: Pulmonary effort is normal.  ?   Breath sounds: Normal breath sounds.  ?Skin: ?   General: Skin is warm and dry.  ?   Comments: Right anterior shin wound and left medial malleolus wound. See pictures in media tab.   ?Neurological:  ?   Mental Status: He is alert and oriented to person, place, and time.  ?Psychiatric:     ?   Behavior: Behavior normal.     ?   Thought Content: Thought content normal.     ?   Judgment: Judgment normal.  ? ? ?Lab Results ?Lab Results  ?Component Value Date  ? WBC 5.6 03/27/2022  ? HGB 10.4 (L) 03/27/2022  ? HCT 32.6 (L) 03/27/2022  ? MCV 86.5 03/27/2022  ? PLT 236 03/27/2022  ?  ?Lab Results   ?Component Value Date  ? CREATININE 1.17 03/27/2022  ? BUN 32 (H) 03/27/2022  ? NA 134 (L) 03/27/2022  ? K 4.2 03/27/2022  ? CL 106 03/27/2022  ? CO2 21 (L) 03/27/2022  ?  ?Lab Results  ?Component Value Date  ? ALT 16 10/17/2020  ? AST 34 10/17/2020  ? ALKPHOS 85 10/17/2020  ? BILITOT 0.8 10/17/2020  ?  ? ?Microbiology: ?Recent Results (from the past 240 hour(s))  ?Culture, blood (routine x 2)     Status: None (Preliminary result)  ? Collection Time: 03/26/22  5:57 PM  ? Specimen: BLOOD  ?Result Value Ref Range Status  ? Specimen Description BLOOD BLOOD RIGHT ARM  Final  ?  Special Requests   Final  ?  BOTTLES DRAWN AEROBIC ONLY Blood Culture results may not be optimal due to an inadequate volume of blood received in culture bottles  ? Culture   Final  ?  NO GROWTH < 24 HOURS ?Performed at [facility], [place] [address], [place],  [address] ?  ? Report Status PENDING  Incomplete  ? ? ? ?Terri Piedra, NP ?[facility] for Infectious Disease ?[facility] ? ?03/27/2022 ? ?1:50 PM ? ?
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
## 2022-03-27 NOTE — Progress Notes (Signed)
Bilateral lower extremity venous duplex has been completed. ?Preliminary results can be found in CV Proc through chart review.  ? ?03/27/22 11:52 AM ?Carlos Levering RVT   ?

## 2022-03-28 ENCOUNTER — Inpatient Hospital Stay (HOSPITAL_COMMUNITY): Payer: Medicaid Other

## 2022-03-28 DIAGNOSIS — M79675 Pain in left toe(s): Secondary | ICD-10-CM

## 2022-03-28 DIAGNOSIS — I83002 Varicose veins of unspecified lower extremity with ulcer of calf: Secondary | ICD-10-CM

## 2022-03-28 DIAGNOSIS — L97201 Non-pressure chronic ulcer of unspecified calf limited to breakdown of skin: Secondary | ICD-10-CM

## 2022-03-28 DIAGNOSIS — N179 Acute kidney failure, unspecified: Secondary | ICD-10-CM | POA: Diagnosis not present

## 2022-03-28 DIAGNOSIS — F411 Generalized anxiety disorder: Secondary | ICD-10-CM | POA: Diagnosis not present

## 2022-03-28 DIAGNOSIS — S81809A Unspecified open wound, unspecified lower leg, initial encounter: Secondary | ICD-10-CM

## 2022-03-28 DIAGNOSIS — R0609 Other forms of dyspnea: Secondary | ICD-10-CM

## 2022-03-28 DIAGNOSIS — G894 Chronic pain syndrome: Secondary | ICD-10-CM

## 2022-03-28 DIAGNOSIS — L03115 Cellulitis of right lower limb: Secondary | ICD-10-CM | POA: Diagnosis not present

## 2022-03-28 DIAGNOSIS — G8929 Other chronic pain: Secondary | ICD-10-CM

## 2022-03-28 LAB — BASIC METABOLIC PANEL
Anion gap: 5 (ref 5–15)
BUN: 24 mg/dL — ABNORMAL HIGH (ref 6–20)
CO2: 24 mmol/L (ref 22–32)
Calcium: 8.6 mg/dL — ABNORMAL LOW (ref 8.9–10.3)
Chloride: 106 mmol/L (ref 98–111)
Creatinine, Ser: 1.08 mg/dL (ref 0.61–1.24)
GFR, Estimated: >60 mL/min (ref >60)
Glucose, Bld: 90 mg/dL (ref 70–99)
Potassium: 4.2 mmol/L (ref 3.5–5.1)
Sodium: 135 mmol/L (ref 135–145)

## 2022-03-28 LAB — CBC
HCT: 33.7 % — ABNORMAL LOW (ref 39.0–52.0)
Hemoglobin: 10.9 g/dL — ABNORMAL LOW (ref 13.0–17.0)
MCH: 28.2 pg (ref 26.0–34.0)
MCHC: 32.3 g/dL (ref 30.0–36.0)
MCV: 87.1 fL (ref 80.0–100.0)
Platelets: 261 10*3/uL (ref 150–400)
RBC: 3.87 MIL/uL — ABNORMAL LOW (ref 4.22–5.81)
RDW: 14.3 % (ref 11.5–15.5)
WBC: 3.7 10*3/uL — ABNORMAL LOW (ref 4.0–10.5)
nRBC: 0 % (ref 0.0–0.2)

## 2022-03-28 MED ORDER — GADOBUTROL 1 MMOL/ML IV SOLN
10.0000 mL | Freq: Once | INTRAVENOUS | Status: AC | PRN
  Administered 2022-03-28: 10 mL via INTRAVENOUS

## 2022-03-28 MED ORDER — VANCOMYCIN HCL 1250 MG/250ML IV SOLN
1250.0000 mg | Freq: Two times a day (BID) | INTRAVENOUS | Status: DC
  Administered 2022-03-28 – 2022-03-29 (×3): 1250 mg via INTRAVENOUS
  Filled 2022-03-28 (×4): qty 250

## 2022-03-28 MED ORDER — ENOXAPARIN SODIUM 60 MG/0.6ML IJ SOSY
60.0000 mg | PREFILLED_SYRINGE | INTRAMUSCULAR | Status: DC
Start: 2022-03-28 — End: 2022-03-30
  Administered 2022-03-28 – 2022-03-29 (×2): 60 mg via SUBCUTANEOUS
  Filled 2022-03-28 (×2): qty 0.6

## 2022-03-28 MED ORDER — PREDNISONE 50 MG PO TABS
50.0000 mg | ORAL_TABLET | Freq: Four times a day (QID) | ORAL | Status: AC
  Administered 2022-03-28 – 2022-03-29 (×3): 50 mg via ORAL
  Filled 2022-03-28 (×4): qty 1

## 2022-03-28 MED ORDER — DIPHENHYDRAMINE HCL 50 MG/ML IJ SOLN
50.0000 mg | Freq: Once | INTRAMUSCULAR | Status: AC
  Administered 2022-03-29: 50 mg via INTRAVENOUS
  Filled 2022-03-28: qty 1

## 2022-03-28 MED ORDER — DIPHENHYDRAMINE HCL 25 MG PO CAPS
50.0000 mg | ORAL_CAPSULE | Freq: Once | ORAL | Status: AC
  Filled 2022-03-28: qty 2

## 2022-03-28 MED ORDER — LACTATED RINGERS IV SOLN: INTRAVENOUS | Status: AC
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
## 2022-03-28 NOTE — TOC Initial Note (Addendum)
Transition of Care (TOC) - Initial/Assessment Note  ? ? ?Patient Details  ?Name: Evan [name] ?MRN: [id number] ?Date of Birth: June 12, 1971 ? ?Transition of Care (TOC) CM/SW Contact:    ?Carles Collet, RN ?Phone Number: ?03/28/2022, 2:24 PM ? ?Clinical Narrative:           Patient admitted for wounds to legs. ?From home with rooomate.  ?[place] nursed assessed and recommended wound care center. Spoke w Dr Tana [place] and requested her to make referral to wound center for them to follow after DC. I feel better care will provided to him by this route instead of home health services that will be limited due to lack of insurance, if available at all with questionable current drug use.  ? ?Prior history of IVDU, + for amphetamines and opiates however home meds include adderall, oxycodone, xanax. ?Nurse reports finding non hospital brand insulin syringe in room on floor.     ?Requested CMA to schedule at a [facility] to establish primary care.  ?TOC will follow for MATCH needs at DC, please send DC scripts through [facility] in anticipation of DC ? ? ?Expected Discharge Plan: Home/Self Care ?Barriers to Discharge: Continued Medical Work up ? ? ?Patient Goals and CMS Choice ?  ?  ?  ? ?Expected Discharge Plan and Services ?Expected Discharge Plan: Home/Self Care ?  ?Discharge Planning Services: CM Consult ?  ?Living arrangements for the past 2 months: [place] ?                ?  ?  ?  ?  ?  ?  ?  ?  ?  ?  ? ?Prior Living Arrangements/Services ?Living arrangements for the past 2 months: [place] ?Lives with:: Roommate ?  ?       ?  ?  ?  ?  ? ?Activities of Daily Living ?Home Assistive Devices/Equipment: None ?ADL Screening (condition at time of admission) ?Patient's cognitive ability adequate to safely complete daily activities?: Yes ?Is the patient deaf or have difficulty hearing?: No ?Does the patient have difficulty seeing, even when wearing glasses/contacts?: No ?Does the patient have difficulty  concentrating, remembering, or making decisions?: No ?Patient able to express need for assistance with ADLs?: Yes ?Does the patient have difficulty dressing or bathing?: No ?Independently performs ADLs?: Yes (appropriate for developmental age) ?Does the patient have difficulty walking or climbing stairs?: Yes ?Weakness of Legs: Both ?Weakness of Arms/Hands: None ? ?Permission Sought/Granted ?  ?  ?   ?   ?   ?   ? ?Emotional Assessment ?  ?  ?  ?  ?  ?  ? ?Admission diagnosis:  Pre-syncope [R55] ?AKI (acute kidney injury) ([place]) [N17.9] ?Cellulitis [L03.90] ?Patient Active Problem List  ? Diagnosis Date Noted  ? Cellulitis 03/27/2022  ? AKI (acute kidney injury) ([place]) 03/26/2022  ? Multiple open wounds of lower leg 03/26/2022  ? Dyspnea on exertion 03/26/2022  ? Great toe pain, right 03/26/2022  ? IV drug abuse ([place])   ? Septic arthritis of knee, right ([place]) 04/17/2019  ? Hardware complicating wound infection ([place]) 04/17/2019  ? Hepatitis C virus infection cured after antiviral drug therapy 04/17/2019  ? Acute lateral meniscus tear of right knee 01/23/2019  ? Displaced fracture of right tibial tuberosity, initial encounter for closed fracture 01/16/2019  ? Closed bicondylar fracture of right tibial plateau 01/15/2019  ? Cough 10/31/2016  ? Depression 06/15/2014  ? Acute bronchitis 01/28/2014  ? Chronic  pain syndrome 01/28/2014  ? ADD (attention deficit disorder) 05/11/2013  ? 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 Eczema 12/30/2012  ? Metastatic melanoma 10/17/2011  ? Chest pain 10/17/2011  ? Insomnia 12/20/2008  ? Anxiety state 12/10/2007  ? ERECTILE DYSFUNCTION 12/10/2007  ? Essential hypertension 11/21/2007  ? ?PCP:  Pcp, No ?Pharmacy:   ?[id number] - [place], [place] ?[place] ?[place] [address] ?Phone: [phone] Fax: [phone] ? ? ? ? ?Social Determinants of Health (SDOH) Interventions ?   ? ?Readmission Risk Interventions ?   ? View : No data to display.  ?  ?  ?  ? ? ? ?

## 2022-03-28 NOTE — Plan of Care (Signed)
?  Problem: Clinical Measurements: ?Goal: Ability to avoid or minimize complications of infection will improve ?Outcome: Progressing ?  ?Problem: Skin Integrity: ?Goal: Skin integrity will improve ?Outcome: Progressing ?  ?Problem: Education: ?Goal: Knowledge of General Education information will improve ?Description: Including pain rating scale, medication(s)/side effects and non-pharmacologic comfort measures ?Outcome: Progressing ?  ?Problem: Health Behavior/Discharge Planning: ?Goal: Ability to manage health-related needs will improve ?Outcome: Progressing ?  ?Problem: Coping: ?Goal: Level of anxiety will decrease ?Outcome: Progressing ?  ?Problem: Pain Managment: ?Goal: General experience of comfort will improve ?Outcome: Progressing ?  ?Problem: Skin Integrity: ?Goal: Risk for impaired skin integrity will decrease ?Outcome: Progressing ?  ?

## 2022-03-28 NOTE — Consult Note (Signed)
Reason for Consult:BLE wounds ?Referring Physician: Estill Cotta ?Time called: 1231 ?Time at bedside: 1320 ? ? ?Evan [name] is an 51 y.o. male.  ?HPI: Clint comes in with increased pain in BLE over the last 3d. He has had an unhealing wound on the right shin for about the past 5 months. He's also c/o BLE coloration changes. He denies fevers, chills, sweats, N/V. He's taken 2 rounds of abx over the past few months without improvement. ? ?Past Medical History:  ?Diagnosis Date  ? ADD (attention deficit disorder) 05/11/2013  ? ANXIETY 12/10/2007  ? BACK PAIN 08/03/2009  ? Chronic pain syndrome 01/28/2014  ? Depression 06/15/2014  ? ERECTILE DYSFUNCTION 12/10/2007  ? Hepatitis C 05/15/2013  ?   "treated and cured"  ? HYPERTENSION 11/21/2007  ? INSOMNIA-SLEEP DISORDER-UNSPEC 12/20/2008  ? Melanoma ([place]) 2015  ? Lymph nodes left side , radiation  ? Neuropathy   ? 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 Preventative health care 05/14/2011  ? ? ?Past Surgical History:  ?Procedure Laterality Date  ? EXTERNAL FIXATION LEG Right 01/16/2019  ? Procedure: EXTERNAL FIXATION RIGHT KNEE;  Surgeon: Shona Needles, MD;  Location: [facility];  Service: Orthopedics;  Laterality: Right;  ? HARDWARE REMOVAL Right 04/17/2019  ? Procedure: HARDWARE REMOVAL RIGHT TIBIA;  Surgeon: Shona Needles, MD;  Location: [facility];  Service: Orthopedics;  Laterality: Right;  ? IRRIGATION AND DEBRIDEMENT KNEE Right 04/17/2019  ? Procedure: IRRIGATION AND DEBRIDEMENT RIGHT KNEE;  Surgeon: Shona Needles, MD;  Location: [facility];  Service: Orthopedics;  Laterality: Right;  ? LYMPHADENECTOMY Left   ? ORIF TIBIA PLATEAU Right 01/20/2019  ? Procedure: OPEN REDUCTION INTERNAL FIXATION (ORIF) TIBIAL PLATEAU;  Surgeon: Shona Needles, MD;  Location: [facility];  Service: Orthopedics;  Laterality: Right;  ? SHOULDER SURGERY Left   ? chronic L dislocating   ? ? ?Family History  ?Problem Relation Age of Onset  ? Cancer Father   ?     melanoma on back  ? Cancer Other    ?     pancreatic  ? ? ?Social History:  reports that he quit smoking about 23 years ago. He has never used smokeless tobacco. He reports that he does not currently use alcohol. He reports that he does not use drugs. ? ?Allergies:  ?Allergies  ?Allergen Reactions  ? Contrast Media [Iodinated Contrast Media] Shortness Of Breath and Swelling  ?  Shortness of breath., throat swelling  ? Fish Allergy Anaphylaxis  ?  No reaction to shellfish  ? ? ?Medications: I have reviewed the patient's current medications. ? ?Results for orders placed or performed during the hospital encounter of 03/26/22 (from the past 48 hour(s))  ?Uric acid     Status: None  ? Collection Time: 03/26/22  3:59 PM  ?Result Value Ref Range  ? Uric Acid, Serum 5.6 3.7 - 8.6 mg/dL  ?  Comment: Performed at [facility], [place] [address], [place], [place] [address]  ?Culture, blood (routine x 2)     Status: None (Preliminary result)  ? Collection Time: 03/26/22  5:57 PM  ? Specimen: BLOOD  ?Result Value Ref Range  ? Specimen Description BLOOD BLOOD RIGHT ARM   ? Special Requests    ?  BOTTLES DRAWN AEROBIC ONLY Blood Culture results may not be optimal due to an inadequate volume of blood received in culture bottles  ? Culture    ?  NO GROWTH 2 DAYS ?Performed at [facility], [place] [address], [place], [place] [address] ?  ? Report Status PENDING   ?TSH     Status: None  ? Collection Time: 03/26/22  5:57 PM  ?Result Value Ref Range  ? TSH 2.716 0.350 - 4.500 uIU/mL  ?  Comment: Performed by a 3rd Generation assay with a functional sensitivity of <=0.01 uIU/mL. ?Performed at [facility], [place] [address] [place], [place] [address] ?  ?HIV Antibody (routine testing w rflx)     Status: None  ? Collection Time: 03/26/22  5:58 PM  ?Result Value Ref Range  ? HIV Screen 4th Generation wRfx Non Reactive Non Reactive  ?  Comment: Performed at [facility], [place] [address], [place], [place] [address]  ?Culture, blood (Routine X 2) w Reflex to ID  Panel     Status: None (Preliminary result)  ? Collection Time: 03/26/22  5:58 PM  ? Specimen: BLOOD LEFT FOREARM  ?Result Value Ref Range  ? Specimen Description BLOOD LEFT FOREARM   ? Special Requests    ?  BOTTLES DRAWN AEROBIC AND ANAEROBIC Blood Culture adequate volume  ? Culture    ?  NO GROWTH 1 DAY ?Performed at [facility], [place] [address], [place], [place] [address] ?  ? Report Status PENDING   ?Urine rapid drug screen (hosp performed)     Status: Abnormal  ? Collection Time: 03/27/22  2:43 AM  ?Result Value Ref Range  ? Opiates POSITIVE (A) NONE DETECTED  ? Cocaine NONE DETECTED NONE DETECTED  ? Benzodiazepines NONE DETECTED NONE DETECTED  ? Amphetamines POSITIVE (A) NONE DETECTED  ? Tetrahydrocannabinol NONE DETECTED NONE DETECTED  ? Barbiturates NONE DETECTED NONE DETECTED  ?  Comment: (NOTE) ?DRUG SCREEN FOR MEDICAL PURPOSES ?ONLY.  IF CONFIRMATION IS NEEDED ?FOR ANY PURPOSE, NOTIFY LAB ?WITHIN 5 DAYS. ? ?LOWEST DETECTABLE LIMITS ?FOR URINE DRUG SCREEN ?Drug Class                     Cutoff (ng/mL) ?Amphetamine and metabolites    1000 ?Barbiturate and metabolites    200 ?Benzodiazepine                 200 ?Tricyclics and metabolites     300 ?Opiates and metabolites        300 ?Cocaine and metabolites        300 ?THC                            50 ?Performed at [facility], [place] [address] [place], [place] ?[address] ?  ?Urinalysis, Routine w reflex microscopic Urine, Clean Catch     Status: None  ? Collection Time: 03/27/22  2:44 AM  ?Result Value Ref Range  ? Color, Urine YELLOW YELLOW  ? APPearance CLEAR CLEAR  ? Specific Gravity, Urine 1.017 1.005 - 1.030  ? pH 6.0 5.0 - 8.0  ? Glucose, UA NEGATIVE NEGATIVE mg/dL  ? Hgb urine dipstick NEGATIVE NEGATIVE  ? Bilirubin Urine NEGATIVE NEGATIVE  ? Ketones, ur NEGATIVE NEGATIVE mg/dL  ? Protein, ur NEGATIVE NEGATIVE mg/dL  ? Nitrite NEGATIVE NEGATIVE  ? Leukocytes,Ua NEGATIVE NEGATIVE  ?  Comment: Performed at [facility], [place]  [address], [place], [place] [address]  ?Sodium, urine, random     Status: None  ? Collection Time: 03/27/22  2:44 AM  ?Result Value Ref Range  ? Sodium, Ur <  10 mmol/L  ?  Comment: Performed at [facility], [place] [address] [place], [place] [address]  ?Creatinine, urine, random     Status: None  ? Collection Time: 03/27/22  2:44 AM  ?Result Value Ref Range  ? Creatinine, Urine 138.85 mg/dL  ?  Comment: Performed at [facility], [place] [address] [place], [place] [address]  ?Basic metabolic panel     Status: Abnormal  ? Collection Time: 03/27/22  5:55 AM  ?Result Value Ref Range  ? Sodium 134 (L) 135 - 145 mmol/L  ? Potassium 4.2 3.5 - 5.1 mmol/L  ? Chloride 106 98 - 111 mmol/L  ? CO2 21 (L) 22 - 32 mmol/L  ? Glucose, Bld 93 70 - 99 mg/dL  ?  Comment: Glucose reference range applies only to samples taken after fasting for at least 8 hours.  ? BUN 32 (H) 6 - 20 mg/dL  ? Creatinine, Ser 1.17 0.61 - 1.24 mg/dL  ? Calcium 8.5 (L) 8.9 - 10.3 mg/dL  ? GFR, Estimated >60 >60 mL/min  ?  Comment: (NOTE) ?Calculated using the CKD-EPI Creatinine Equation (2021) ?  ? Anion gap 7 5 - 15  ?  Comment: Performed at [facility], [place] [address] [place], [place] [address]  ?CBC     Status: Abnormal  ? Collection Time: 03/27/22  5:55 AM  ?Result Value Ref Range  ? WBC 5.6 4.0 - 10.5 K/uL  ? RBC 3.77 (L) 4.22 - 5.81 MIL/uL  ? Hemoglobin 10.4 (L) 13.0 - 17.0 g/dL  ? HCT 32.6 (L) 39.0 - 52.0 %  ? MCV 86.5 80.0 - 100.0 fL  ? MCH 27.6 26.0 - 34.0 pg  ? MCHC 31.9 30.0 - 36.0 g/dL  ? RDW 14.5 11.5 - 15.5 %  ? Platelets 236 150 - 400 K/uL  ? nRBC 0.0 0.0 - 0.2 %  ?  Comment: Performed at [facility], [place] [address] [place], [place] [address]  ?CBC     Status: Abnormal  ? Collection Time: 03/28/22  4:33 AM  ?Result Value Ref Range  ? WBC 3.7 (L) 4.0 - 10.5 K/uL  ? RBC 3.87 (L) 4.22 - 5.81 MIL/uL  ? Hemoglobin 10.9 (L) 13.0 - 17.0 g/dL  ? HCT 33.7 (L) 39.0 - 52.0 %  ? MCV 87.1 80.0 - 100.0 fL  ? MCH 28.2 26.0 - 34.0 pg  ? MCHC  32.3 30.0 - 36.0 g/dL  ? RDW 14.3 11.5 - 15.5 %  ? Platelets 261 150 - 400 K/uL  ? nRBC 0.0 0.0 - 0.2 %  ?  Comment: Performed at [facility], [place] [address] [place], [place] [address]  ?Basic
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
## 2022-03-28 NOTE — Progress Notes (Signed)
IR aware of request for aspiration/biopsy of BLE wounds/left ankle abscess - this will be reviewed by IR MD on 4/6 to determine if patient is appropriate for procedure. ? ?Patient made NPO at midnight in case procedure is approved and requires sedation. IR team will update primary team as soon as decision is made regarding procedure. ? ?Please call IR with any questions or concerns. ? ?Candiss Norse, PA-C ?

## 2022-03-28 NOTE — Progress Notes (Signed)
? ? ?[facility] for Infectious Disease ? ?Date of Admission:  03/26/2022    ? ?Total days of antibiotics 3 ?        ?ASSESSMENT: ? ?Mr. Glodowski's left ankle MRI with soft tissue fluid collection/abscess with no underlying osteomyelitis. Awaiting MRI of right tibia/fibula. Recommend surgical/IR evaluation for potential punch biopsy sent for pathology and abscess aspiration for culture and sensitivity as there is possibility this may not be an infectious process such as pyoderma gangrenosum. In the meantime will continue with broad spectrum vancomycin and ceftriaxone. Continue wound care per [facility] RN recommendations and remaining supportive and medical per primary team.  ? ?PLAN: ? ?Continue current dose of vancomycin and ceftriaxone. ?Await MRI of right tibia/fibula ?Recommend surgical evaluation for possible biopsy/aspiration ?Wound care per [facility] RN recommendations. ?Remaining medical and supportive care per primary team.  ? ?Principal Problem: ?  Dyspnea on exertion ?Active Problems: ?  Anxiety state ?  Essential hypertension ?  Chronic pain syndrome ?  AKI (acute kidney injury) ([place]) ?  Multiple open wounds of lower leg ?  Great toe pain, right ?  Cellulitis ? ? ? amLODipine  5 mg Oral Daily  ? enoxaparin (LOVENOX) injection  40 mg Subcutaneous Q24H  ? leptospermum manuka honey  1 application. Topical Daily  ? pantoprazole  40 mg Oral Daily  ? ? ?SUBJECTIVE: ? ?Afebrile overnight with no acute events. Has concerns about venous stasis.  ? ?Allergies  ?Allergen Reactions  ? Contrast Media [Iodinated Contrast Media] Shortness Of Breath and Swelling  ?  Shortness of breath., throat swelling  ? Fish Allergy Anaphylaxis  ?  No reaction to shellfish  ? ? ? ?Review of Systems: ?Review of Systems  ?Constitutional:  Negative for chills, fever and weight loss.  ?Respiratory:  Negative for cough, shortness of breath and wheezing.   ?Cardiovascular:  Negative for chest pain and leg swelling.  ?Gastrointestinal:  Negative for  abdominal pain, constipation, diarrhea, nausea and vomiting.  ?Skin:  Negative for rash.  ? ? ? ?OBJECTIVE: ?Vitals:  ? 03/27/22 1703 03/27/22 2045 03/28/22 0501 03/28/22 0917  ?BP: (!) 149/85 139/85 (!) 100/49 (!) 155/98  ?Pulse: 71 74 72 73  ?Resp: '18 18 18 17  '$ ?Temp: 97.6 ?F (36.4 ?C) 98 ?F (36.7 ?C) 98.5 ?F (36.9 ?C) 97.8 ?F (36.6 ?C)  ?TempSrc:      ?SpO2: 99% 98% 97% 97%  ?Weight:      ?Height:      ? ?Body mass index is 35.55 kg/m?. ? ?Physical Exam ?Constitutional:   ?   General: He is not in acute distress. ?   Appearance: He is well-developed.  ?Cardiovascular:  ?   Rate and Rhythm: Normal rate and regular rhythm.  ?   Heart sounds: Normal heart sounds.  ?Pulmonary:  ?   Effort: Pulmonary effort is normal.  ?   Breath sounds: Normal breath sounds.  ?Musculoskeletal:  ?   Comments: Wounds unchanged from previous.   ?Skin: ?   General: Skin is warm and dry.  ?Neurological:  ?   Mental Status: He is alert.  ?Psychiatric:     ?   Thought Content: Thought content normal.     ?   Judgment: Judgment normal.  ? ? ?Lab Results ?Lab Results  ?Component Value Date  ? WBC 3.7 (L) 03/28/2022  ? HGB 10.9 (L) 03/28/2022  ? HCT 33.7 (L) 03/28/2022  ? MCV 87.1 03/28/2022  ? PLT 261 03/28/2022  ?  ?Lab Results  ?  Component Value Date  ? CREATININE 1.08 03/28/2022  ? BUN 24 (H) 03/28/2022  ? NA 135 03/28/2022  ? K 4.2 03/28/2022  ? CL 106 03/28/2022  ? CO2 24 03/28/2022  ?  ?Lab Results  ?Component Value Date  ? ALT 16 10/17/2020  ? AST 34 10/17/2020  ? ALKPHOS 85 10/17/2020  ? BILITOT 0.8 10/17/2020  ?  ? ?Microbiology: ?Recent Results (from the past 240 hour(s))  ?Culture, blood (routine x 2)     Status: None (Preliminary result)  ? Collection Time: 03/26/22  5:57 PM  ? Specimen: BLOOD  ?Result Value Ref Range Status  ? Specimen Description BLOOD BLOOD RIGHT ARM  Final  ? Special Requests   Final  ?  BOTTLES DRAWN AEROBIC ONLY Blood Culture results may not be optimal due to an inadequate volume of blood received in culture  bottles  ? Culture   Final  ?  NO GROWTH 2 DAYS ?Performed at [facility], [place] [address], [place], [place] [address] ?  ? Report Status PENDING  Incomplete  ?Culture, blood (Routine X 2) w Reflex to ID Panel     Status: None (Preliminary result)  ? Collection Time: 03/26/22  5:58 PM  ? Specimen: BLOOD LEFT FOREARM  ?Result Value Ref Range Status  ? Specimen Description BLOOD LEFT FOREARM  Final  ? Special Requests   Final  ?  BOTTLES DRAWN AEROBIC AND ANAEROBIC Blood Culture adequate volume  ? Culture   Final  ?  NO GROWTH 1 DAY ?Performed at [facility], [place] [address], [place], [place] [address] ?  ? Report Status PENDING  Incomplete  ? ? ? ?Terri Piedra, NP ?[facility] for Infectious Disease ?[facility] ? ?03/28/2022 ? ?10:25 AM ? ?

## 2022-03-29 ENCOUNTER — Inpatient Hospital Stay (HOSPITAL_COMMUNITY): Payer: Medicaid Other

## 2022-03-29 DIAGNOSIS — N179 Acute kidney failure, unspecified: Secondary | ICD-10-CM | POA: Diagnosis not present

## 2022-03-29 DIAGNOSIS — F411 Generalized anxiety disorder: Secondary | ICD-10-CM | POA: Diagnosis not present

## 2022-03-29 DIAGNOSIS — R0609 Other forms of dyspnea: Secondary | ICD-10-CM | POA: Diagnosis not present

## 2022-03-29 DIAGNOSIS — G894 Chronic pain syndrome: Secondary | ICD-10-CM | POA: Diagnosis not present

## 2022-03-29 LAB — HCV RNA QUANT: HCV Quantitative: NOT DETECTED IU/mL (ref >50)

## 2022-03-29 MED ORDER — ALPRAZOLAM 0.5 MG PO TABS
0.5000 mg | ORAL_TABLET | Freq: Three times a day (TID) | ORAL | Status: DC | PRN
  Administered 2022-03-29 – 2022-03-30 (×3): 0.5 mg via ORAL
  Filled 2022-03-29 (×3): qty 1

## 2022-03-29 MED ORDER — LOSARTAN POTASSIUM 50 MG PO TABS
50.0000 mg | ORAL_TABLET | Freq: Every day | ORAL | Status: DC
  Administered 2022-03-29 – 2022-03-30 (×2): 50 mg via ORAL
  Filled 2022-03-29 (×2): qty 1

## 2022-03-29 MED ORDER — ATENOLOL 50 MG PO TABS
25.0000 mg | ORAL_TABLET | Freq: Every day | ORAL | Status: DC
  Administered 2022-03-29 – 2022-03-30 (×2): 25 mg via ORAL
  Filled 2022-03-29 (×2): qty 1

## 2022-03-29 MED ORDER — ZOLPIDEM TARTRATE 5 MG PO TABS
5.0000 mg | ORAL_TABLET | Freq: Every day | ORAL | Status: DC
Start: 2022-03-29 — End: 2022-03-30
  Administered 2022-03-29: 5 mg via ORAL
  Filled 2022-03-29: qty 1

## 2022-03-29 MED ORDER — AMLODIPINE BESYLATE 5 MG PO TABS: 5.0000 mg | ORAL_TABLET | Freq: Once | ORAL | Status: DC

## 2022-03-29 MED ORDER — IOHEXOL 350 MG/ML SOLN
100.0000 mL | Freq: Once | INTRAVENOUS | Status: AC | PRN
  Administered 2022-03-29: 75 mL via INTRAVENOUS

## 2022-03-29 MED ORDER — AMLODIPINE BESYLATE 10 MG PO TABS: 10.0000 mg | ORAL_TABLET | Freq: Every day | ORAL | Status: DC

## 2022-03-29 NOTE — Progress Notes (Signed)
?  Request seen for wound biopsy and possible abscess aspiration. ? ?Images reviewed by Dr. Dwaine Gale. ? ?MR showed No evidence of well-defined/drainable fluid collection. ? ?Imaging equipment is not needed for open wound biopsy. ? ?Recommend surgical biopsy. ? ?Murrell Redden PA-C ?03/29/2022 ?8:34 AM ? ? ?  ?

## 2022-03-29 NOTE — Progress Notes (Signed)
? ? ?[place] [facility] ? ?Date of Admission:  03/26/2022    ? ?Total days of antibiotics 4 ?        ?ASSESSMENT: ? ?Evan [name] has been seen by Orthopedics and IR with no indications for interventions and Dr. Sharol Given indicating biopsy can be done outpatient as indicated. Discussed plan of care to stop antibiotics at this time as there does not appear to be active infection with concern remaining for pyoderma gangrenosum or similar. Will need additional work up for confirmation and will defer to Orthopedics on follow up. Addressed concern about hardware in his leg and at this point does not seem to be causing any problems. Will need further evaluation for swelling and possible autoimmune process.  Hepatitis C RNA undetectable with no treatment indicated. [place] for discharge from ID standpoint. Remaining medical and supportive care per primary team. ?  ?PLAN: ? ?Discontinue antibiotics.  ?Continue wound care ?Follow up with Orthopedics as planned.  ?[place] for discharge from ID standpoint ?Follow up in ID office on 5/8.  ?Remaining medical and supportive care per primary team.  ? ?Principal Problem: ?  Dyspnea on exertion ?Active Problems: ?  Anxiety state ?  Essential hypertension ?  Chronic pain syndrome ?  AKI (acute kidney injury) ([place]) ?  Multiple open wounds of lower leg ?  Great toe pain, right ?  Cellulitis ?  Venous stasis ulcer of calf limited to breakdown of skin with varicose veins (HCC) ? ? ? amLODipine  5 mg Oral Daily  ? enoxaparin (LOVENOX) injection  60 mg Subcutaneous Q24H  ? pantoprazole  40 mg Oral Daily  ? zolpidem  5 mg Oral QHS  ? ? ?SUBJECTIVE: ? ?Afebrile with no acute events. Has questions about plan of care and continued swelling in his hands.  ? ?Allergies  ?Allergen Reactions  ? Contrast Media [Iodinated Contrast Media] Shortness Of Breath and Swelling  ?  Shortness of breath., throat swelling  ? Fish Allergy Anaphylaxis  ?  No reaction to shellfish  ? ? ? ?Review of  Systems: ?Review of Systems  ?Constitutional:  Negative for chills, fever and weight loss.  ?Respiratory:  Negative for cough, shortness of breath and wheezing.   ?Cardiovascular:  Negative for chest pain and leg swelling.  ?Gastrointestinal:  Negative for abdominal pain, constipation, diarrhea, nausea and vomiting.  ?Skin:  Negative for rash.  ? ? ? ?OBJECTIVE: ?Vitals:  ? 03/28/22 1710 03/28/22 2115 03/29/22 0520 03/29/22 1018  ?BP: (!) 163/106 129/60 (!) 161/93 (!) 169/111  ?Pulse: 75 72 68 76  ?Resp: '17 18 17 19  '$ ?Temp: 97.9 ?F (36.6 ?C) 98.1 ?F (36.7 ?C) 98 ?F (36.7 ?C) 97.9 ?F (36.6 ?C)  ?TempSrc: Oral Oral Oral Oral  ?SpO2: 100% 97% 100% 100%  ?Weight:      ?Height:      ? ?Body mass index is 35.55 kg/m?. ? ?Physical Exam ?Constitutional:   ?   General: He is not in acute distress. ?   Appearance: He is well-developed.  ?Cardiovascular:  ?   Rate and Rhythm: Normal rate and regular rhythm.  ?   Heart sounds: Normal heart sounds.  ?Pulmonary:  ?   Effort: Pulmonary effort is normal.  ?   Breath sounds: Normal breath sounds.  ?Musculoskeletal:  ?   Comments: Wounds appear unchanged.   ?Skin: ?   General: Skin is warm and dry.  ?Neurological:  ?   Mental Status: He is alert.  ?Psychiatric:     ?  Mood and Affect: Mood normal.     ?   Thought Content: Thought content normal.     ?   Judgment: Judgment normal.  ? ? ?Lab Results ?Lab Results  ?Component Value Date  ? WBC 3.7 (L) 03/28/2022  ? HGB 10.9 (L) 03/28/2022  ? HCT 33.7 (L) 03/28/2022  ? MCV 87.1 03/28/2022  ? PLT 261 03/28/2022  ?  ?Lab Results  ?Component Value Date  ? CREATININE 1.08 03/28/2022  ? BUN 24 (H) 03/28/2022  ? NA 135 03/28/2022  ? K 4.2 03/28/2022  ? CL 106 03/28/2022  ? CO2 24 03/28/2022  ?  ?Lab Results  ?Component Value Date  ? ALT 16 10/17/2020  ? AST 34 10/17/2020  ? ALKPHOS 85 10/17/2020  ? BILITOT 0.8 10/17/2020  ?  ? ?Microbiology: ?Recent Results (from the past 240 hour(s))  ?Culture, blood (routine x 2)     Status: None  (Preliminary result)  ? Collection Time: 03/26/22  5:57 PM  ? Specimen: BLOOD  ?Result Value Ref Range Status  ? Specimen Description BLOOD BLOOD RIGHT ARM  Final  ? Special Requests   Final  ?  BOTTLES DRAWN AEROBIC ONLY Blood Culture results may not be optimal due to an inadequate volume of blood received in culture bottles  ? Culture   Final  ?  NO GROWTH 3 DAYS ?Performed at [facility], [place] [address] [place], [place] [address] ?  ? Report Status PENDING  Incomplete  ?Culture, blood (Routine X 2) w Reflex to ID Panel     Status: None (Preliminary result)  ? Collection Time: 03/26/22  5:58 PM  ? Specimen: BLOOD LEFT FOREARM  ?Result Value Ref Range Status  ? Specimen Description BLOOD LEFT FOREARM  Final  ? Special Requests   Final  ?  BOTTLES DRAWN AEROBIC AND ANAEROBIC Blood Culture adequate volume  ? Culture   Final  ?  NO GROWTH 2 DAYS ?Performed at [facility], [place] [address] [place], [place] [address] ?  ? Report Status PENDING  Incomplete  ? ? ? ?Terri Piedra, NP ?[place] for Infectious Disease ?[facility] ? ?03/29/2022 ? ?1:36 PM ? ?

## 2022-03-29 NOTE — Plan of Care (Signed)
Patient ID: Daily Crate, male   DOB: [birth year], 51 y.o.   MRN: [id number] ? ?Problem: Clinical Measurements: ?Goal: Ability to avoid or minimize complications of infection will improve ?Outcome: Progressing ?  ?Problem: Skin Integrity: ?Goal: Skin integrity will improve ?Outcome: Progressing ?  ?Problem: Education: ?Goal: Knowledge of General Education information will improve ?Description: Including pain rating scale, medication(s)/side effects and non-pharmacologic comfort measures ?Outcome: Progressing ?  ?Problem: Health Behavior/Discharge Planning: ?Goal: Ability to manage health-related needs will improve ?Outcome: Progressing ?  ?Problem: Clinical Measurements: ?Goal: Ability to maintain clinical measurements within normal limits will improve ?Outcome: Progressing ?Goal: Will remain free from infection ?Outcome: Progressing ?Goal: Diagnostic test results will improve ?Outcome: Progressing ?Goal: Respiratory complications will improve ?Outcome: Progressing ?Goal: Cardiovascular complication will be avoided ?Outcome: Progressing ?  ?Problem: Activity: ?Goal: Risk for activity intolerance will decrease ?Outcome: Progressing ?  ?Problem: Nutrition: ?Goal: Adequate nutrition will be maintained ?Outcome: Progressing ?  ?Problem: Coping: ?Goal: Level of anxiety will decrease ?Outcome: Progressing ?  ?Problem: Elimination: ?Goal: Will not experience complications related to bowel motility ?Outcome: Progressing ?Goal: Will not experience complications related to urinary retention ?Outcome: Progressing ?  ?Problem: Pain Managment: ?Goal: General experience of comfort will improve ?Outcome: Progressing ?  ?Problem: Safety: ?Goal: Ability to remain free from injury will improve ?Outcome: Progressing ?  ?Problem: Skin Integrity: ?Goal: Risk for impaired skin integrity will decrease ?Outcome: Progressing ?  ? ?Haydee Salter, RN ? ?
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
## 2022-03-29 NOTE — Progress Notes (Signed)
Patient has not put on socks that have been ordered. Offered several times to assist and provided education. Will continue to monitor. ? ?Haydee Salter, RN ? ?

## 2022-03-29 NOTE — Progress Notes (Signed)
Patient refused to wear the socks. Reinforced education, refused. ?

## 2022-03-29 NOTE — Progress Notes (Signed)
Orthopedic Tech Progress Note ?Patient Details:  ?Evan [name] ?Feb 10, 1971 ?[id number] ?Called in order for socks from [facility] ?Patient ID: Evan [name], male   DOB: October 04, 1971, 51 y.o.   MRN: [id number] ? ?Dino Borntreger A Juanda Luba ?03/29/2022, 11:34 AM ? ?

## 2022-03-29 NOTE — Consult Note (Addendum)
[facility] Nurse wound follow up ?I spoke with Dr. Sharol Given about the patient's concerns.  Dr. Sharol Given requested I relay to the patient what he has covered in his note at 10:39 today, but the patient had been taken out of his room, and I did not get the opportunity to speak with him.  I did speak with his nurse and explained the POC and requested she relay the information to the patient. She has agreed to do so. ? ?Because Dr. Jess Barters compression socks are designed to go directly against the wound bed, I have discontinued the previously ordered [employer]. ? ?Val Riles, RN, MSN, CWOCN, CNS-BC, pager [phone]  ?

## 2022-03-29 NOTE — Consult Note (Signed)
WOC Nurse Consult Note: ?Patient receiving care in [place]. ?Reason for Consult: application of BLE profore wraps ?Wound type: per record, venous ulcers to BLE (right pretibial, left medial malleolus) ?The patient explained in detail, that infectious disease specialist has told him from their perspective, they do not think the left wound is from venous insufficiency. A surgical biopsy of the LLE wound area by surgery has been recommended by the Vascular & Interventional PA-C this morning. ? ?The patient is very concerned about "the hardware" in his LLE and wants to find out if the hardware needs to be removed. ? ?The patient is confused and concerned about what he views as conflicting information, and wishes to speak with Dr. Sharol Given, and the infectious disease doctor, and find out a definitive and cohesive POC.  He is requesting to shower before any compression wrap application, in addition to the conversations. ? ?Profore wraps are in his room for use, should the decision to move forward with these is made. ? ?I have reached out to Dr. Sharol Given via [place] to let him be aware of the concerns. ? ?Val Riles, RN, MSN, CWOCN, CNS-BC, pager [phone]  ? ? ?

## 2022-03-29 NOTE — Progress Notes (Signed)
Patient ID: Evan [name], male   DOB: 10/12/1971, 51 y.o.   MRN: [id number] ?I discussed patient's care with Sylvester Harder, wound ostomy continence nursing, I agree with the concern for possible pyoderma gangrenosum.  We will be able to follow this in the office as we treat the venous insufficiency and tract the resolution of the ulcers with compression.  We may need to proceed with a wound biopsy in the office as an outpatient.  Patient wishes to shower daily and does not want to pursue the compression wraps at this time.  I will order 20/30 compression stockings from [facility] and patient should wear the stocking around-the-clock change daily after bathing and showering and rewash the socks.  I will follow-up in the office 1 week after discharge.  I will reevaluate patient tomorrow morning after the socks were applied and addressed all concerns. ?

## 2022-03-30 DIAGNOSIS — R0609 Other forms of dyspnea: Secondary | ICD-10-CM | POA: Diagnosis not present

## 2022-03-30 DIAGNOSIS — L03119 Cellulitis of unspecified part of limb: Secondary | ICD-10-CM

## 2022-03-30 DIAGNOSIS — G894 Chronic pain syndrome: Secondary | ICD-10-CM | POA: Diagnosis not present

## 2022-03-30 DIAGNOSIS — N179 Acute kidney failure, unspecified: Secondary | ICD-10-CM | POA: Diagnosis not present

## 2022-03-30 DIAGNOSIS — I10 Essential (primary) hypertension: Secondary | ICD-10-CM

## 2022-03-30 LAB — ENA+DNA/DS+ANTICH+CENTRO+JO...
Anti JO-1: <0.2 AI (ref 0.0–0.9)
Centromere Ab Screen: <0.2 AI (ref 0.0–0.9)
Chromatin Ab SerPl-aCnc: <0.2 AI (ref 0.0–0.9)
ENA SM Ab Ser-aCnc: <0.2 AI (ref 0.0–0.9)
Ribonucleic Protein: <0.2 AI (ref 0.0–0.9)
SSA (Ro) (ENA) Antibody, IgG: <0.2 AI (ref 0.0–0.9)
SSB (La) (ENA) Antibody, IgG: <0.2 AI (ref 0.0–0.9)
Scleroderma (Scl-70) (ENA) Antibody, IgG: <0.2 AI (ref 0.0–0.9)
ds DNA Ab: 10 IU/mL — ABNORMAL HIGH (ref 0–9)

## 2022-03-30 LAB — ANA W/REFLEX IF POSITIVE: Anti Nuclear Antibody (ANA): POSITIVE — AB

## 2022-03-30 LAB — RHEUMATOID FACTOR: Rheumatoid fact SerPl-aCnc: <10 IU/mL (ref <14.0)

## 2022-03-30 MED ORDER — LOSARTAN POTASSIUM 50 MG PO TABS: 50.0000 mg | ORAL_TABLET | Freq: Every day | ORAL | 3 refills | Status: DC

## 2022-03-30 MED ORDER — PANTOPRAZOLE SODIUM 40 MG PO TBEC
40.0000 mg | DELAYED_RELEASE_TABLET | Freq: Every day | ORAL | 1 refills | Status: DC
Start: 2022-03-31 — End: 2023-07-04

## 2022-03-30 MED ORDER — ZOLPIDEM TARTRATE 10 MG PO TABS: 10.0000 mg | ORAL_TABLET | Freq: Every evening | ORAL | 0 refills | Status: DC | PRN

## 2022-03-30 MED ORDER — ATENOLOL 25 MG PO TABS: 25.0000 mg | ORAL_TABLET | Freq: Every day | ORAL | 3 refills | Status: DC

## 2022-03-30 MED ORDER — ALPRAZOLAM 0.5 MG PO TABS: 0.5000 mg | ORAL_TABLET | Freq: Three times a day (TID) | ORAL | 0 refills | Status: DC | PRN

## 2022-03-30 NOTE — Progress Notes (Signed)
Evan [name] to be D/C'd Home per MD order.  Discussed with the patient and all questions fully answered. ? ?IV catheter discontinued intact. Site without signs and symptoms of complications. Dressing and pressure applied. ? ?An After Visit Summary was printed and given to the patient. Patient prescriptions sent to pharmacy. ? ?D/c education completed with patient/family including follow up instructions, medication list, d/c activities limitations if indicated, with other d/c instructions as indicated by MD - patient able to verbalize understanding, all questions fully answered.  ? ?Patient instructed to return to ED, call 911, or call MD for any changes in condition.  ? ?Patient escorted via [place], and D/C home via private auto. ? ?[name] ?03/30/2022 12:32 PM  ?
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
## 2022-03-31 LAB — CULTURE, BLOOD (ROUTINE X 2): Culture: NO GROWTH

## 2022-04-01 LAB — CULTURE, BLOOD (ROUTINE X 2)
Culture: NO GROWTH
Special Requests: ADEQUATE

## 2022-04-03 ENCOUNTER — Telehealth: Payer: Self-pay

## 2022-04-03 NOTE — Telephone Encounter (Signed)
Patient called and states that he was seen in the ED around 5 days ago and was told by Dr. Sharol Given to follow up in the office within a week ( meaning this week). He is completely full in his schedule. Please contact patient if he is able to be double booked. Also mentions that he has been having increased swelling in his left and right leg. Compressions socks have been provided to the patient.  ?

## 2022-04-03 NOTE — Telephone Encounter (Signed)
I called and sw pt appt sch for tomorrow with Evan [name] at 2pm.  ?

## 2022-04-04 ENCOUNTER — Ambulatory Visit (INDEPENDENT_AMBULATORY_CARE_PROVIDER_SITE_OTHER): Payer: Medicaid Other | Admitting: Family

## 2022-04-04 ENCOUNTER — Encounter: Payer: Self-pay | Admitting: Family

## 2022-04-04 DIAGNOSIS — I83012 Varicose veins of right lower extremity with ulcer of calf: Secondary | ICD-10-CM | POA: Diagnosis not present

## 2022-04-04 DIAGNOSIS — E8989 Other postprocedural endocrine and metabolic complications and disorders: Secondary | ICD-10-CM | POA: Diagnosis not present

## 2022-04-04 DIAGNOSIS — R600 Localized edema: Secondary | ICD-10-CM | POA: Diagnosis not present

## 2022-04-04 DIAGNOSIS — L97211 Non-pressure chronic ulcer of right calf limited to breakdown of skin: Secondary | ICD-10-CM

## 2022-04-04 DIAGNOSIS — I890 Lymphedema, not elsewhere classified: Secondary | ICD-10-CM | POA: Diagnosis not present

## 2022-04-04 NOTE — Progress Notes (Signed)
? ?Office Visit Note ?  ?Patient: Evan [name]           ?Date of Birth: Dec 21, 1971           ?MRN: [id number] ?Visit Date: 04/04/2022 ?             ?Requested by: No referring provider defined for this encounter. ?PCP: Pcp, No ? ?Chief Complaint  ?Patient presents with  ? Left Leg - Follow-up  ?  Hospital follow up   ? Right Leg - Follow-up  ? ? ? ? ?HPI: ?The patient is a 51 year old gentleman who presents today in hospitalization follow-up.  He has had a recent hospitalization for venous ulcerations as well as shortness of breath/dyspnea on exertion ? ?He has issues with chronic edema and about 5 months ago he bumped his right lower leg on a nightstand drawer and sustained a traumatic skin tear this has been very slow to heal.  He also has more recently obtained a wound to his medial left ankle this has also been slow to heal.  ? ?Hospitalized for cellulitis while hospitalized had ABIs performed which showed no arterial disease.  He also was taken off of antibiotics there was some concern for infection.  Of note he did have a remote tibial plateau fracture with concern of tibial plateau hardware infection.  Hardware remains in place. ? ?medical history significant of HTN, treated hepatitis C, ADD, anxiety and depression, chronic pain, insomnia, hx of melanoma  ? ? ? ?He is also complaining of bilateral upper extremity edema today.  He states he has had lymph nodes removed on the right.  He does have significant pitting edema left worse than right.  He feels that the upper extremity edema has come on since his hospitalization cannot think of a dietary change or medication change that may be causing this ? ?Assessment & Plan: ?Visit Diagnoses: No diagnosis found. ? ?Plan: Has 20 to 30 mmHg compression garments these were placed on patient's bilateral lower extremities today he understands that he is to wear these around-the-clock change daily or every other day with bathing ? ?We will also have the patient  get set up with lymphedema pumps to treat his lymphedema. ? ? ?Follow-Up Instructions: No follow-ups on file.  ? ?Ortho Exam ? ?Patient is alert, oriented, no adenopathy, well-dressed, normal affect, normal respiratory effort. ?On examination bilateral lower extremities the patient has edema up to the thighs.  There is weeping from his right lower extremity wounds.  He has brawny skin color changes to the lower extremities right significantly worse than left to the right anterior shin he has a large venous ulcer with serous weeping there is no surrounding erythema no warmth no maceration or surrounding skin breakdown there is granulation in the wound bed.  On examination of the left lower extremity he again has tight edema up to the thigh there is a medial ankle ulcer with some eschar in place.  Please see attached photos.  This is about 3 cm in diameter.  There is no erythema drainage no sign of infection ? ?Right calf circumference 51 cm left calf circumference 47 cm ? ?Imaging: ?No results found. ?No images are attached to the encounter. ? ?Labs: ?Lab Results  ?Component Value Date  ? HGBA1C 5.6 04/17/2019  ? ESRSEDRATE 72 (H) 05/19/2019  ? ESRSEDRATE 59 (H) 05/05/2019  ? ESRSEDRATE 75 (H) 04/17/2019  ? CRP 65.7 (H) 06/23/2019  ? CRP 61.5 (H) 06/04/2019  ? CRP 47.8 (H)  05/26/2019  ? LABURIC 5.6 03/26/2022  ? REPTSTATUS 04/01/2022 FINAL 03/26/2022  ? GRAMSTAIN  04/17/2019  ?  RARE WBC PRESENT,BOTH PMN AND MONONUCLEAR ?NO ORGANISMS SEEN ?  ? CULT  03/26/2022  ?  NO GROWTH 5 DAYS ?Performed at [facility], [place] [address], [place], [place] [address] ?  ? ? ? ?Lab Results  ?Component Value Date  ? ALBUMIN 3.8 10/17/2020  ? ALBUMIN 2.4 (L) 05/06/2019  ? ALBUMIN 2.4 (L) 04/17/2019  ? ? ?No results found for: MG ?Lab Results  ?Component Value Date  ? VD25OH 32.0 04/17/2019  ? ? ?No results found for: PREALBUMIN ? ?  Latest Ref Rng & Units 03/28/2022  ?  4:33 AM 03/27/2022  ?  5:55 AM 03/26/2022  ?  5:40 AM  ?CBC  EXTENDED  ?WBC 4.0 - 10.5 K/uL 3.7   5.6   10.9    ?RBC 4.22 - 5.81 MIL/uL 3.87   3.77   4.63    ?Hemoglobin 13.0 - 17.0 g/dL 10.9   10.4   12.9    ?HCT 39.0 - 52.0 % 33.7   32.6   41.3    ?Platelets 150 - 400 K/uL 261   236   328    ? ? ? ?There is no height or weight on file to calculate BMI. ? ?Orders:  ?No orders of the defined types were placed in this encounter. ? ?No orders of the defined types were placed in this encounter. ? ? ? Procedures: ?No procedures performed ? ?Clinical Data: ?No additional findings. ? ?ROS: ? ?All other systems negative, except as noted in the HPI. ?Review of Systems ? ?Objective: ?Vital Signs: There were no vitals taken for this visit. ? ?Specialty Comments:  ?No specialty comments available. ? ?PMFS History: ?Patient Active Problem List  ? Diagnosis Date Noted  ? Venous stasis ulcer of calf limited to breakdown of skin with varicose veins (HCC)   ? Cellulitis 03/27/2022  ? AKI (acute kidney injury) ([place]) 03/26/2022  ? Multiple open wounds of lower leg 03/26/2022  ? Dyspnea on exertion 03/26/2022  ? Great toe pain, right 03/26/2022  ? IV drug abuse ([place])   ? Septic arthritis of knee, right ([place]) 04/17/2019  ? Hardware complicating wound infection ([place]) 04/17/2019  ? Hepatitis C virus infection cured after antiviral drug therapy 04/17/2019  ? Acute lateral meniscus tear of right knee 01/23/2019  ? Displaced fracture of right tibial tuberosity, initial encounter for closed fracture 01/16/2019  ? Closed bicondylar fracture of right tibial plateau 01/15/2019  ? Cough 10/31/2016  ? Depression 06/15/2014  ? Acute bronchitis 01/28/2014  ? Chronic pain syndrome 01/28/2014  ? ADD (attention deficit disorder) 05/11/2013  ? 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 Eczema 12/30/2012  ? Metastatic melanoma 10/17/2011  ? Chest pain 10/17/2011  ? Insomnia 12/20/2008  ? Anxiety state 12/10/2007  ? ERECTILE DYSFUNCTION 12/10/2007  ? Essential hypertension 11/21/2007  ? ?Past Medical  History:  ?Diagnosis Date  ? ADD (attention deficit disorder) 05/11/2013  ? ANXIETY 12/10/2007  ? BACK PAIN 08/03/2009  ? Chronic pain syndrome 01/28/2014  ? Depression 06/15/2014  ? ERECTILE DYSFUNCTION 12/10/2007  ? Hepatitis C 05/15/2013  ?   "treated and cured"  ? HYPERTENSION 11/21/2007  ? INSOMNIA-SLEEP DISORDER-UNSPEC 12/20/2008  ? Melanoma ([place]) 2015  ? Lymph nodes left side , radiation  ? Neuropathy   ? 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 Preventative health care 05/14/2011  ?  ?Family History  ?  Problem Relation Age of Onset  ? Cancer Father   ?     melanoma on back  ? Cancer Other   ?     pancreatic  ?  ?Past Surgical History:  ?Procedure Laterality Date  ? EXTERNAL FIXATION LEG Right 01/16/2019  ? Procedure: EXTERNAL FIXATION RIGHT KNEE;  Surgeon: Shona Needles, MD;  Location: [facility];  Service: Orthopedics;  Laterality: Right;  ? HARDWARE REMOVAL Right 04/17/2019  ? Procedure: HARDWARE REMOVAL RIGHT TIBIA;  Surgeon: Shona Needles, MD;  Location: [facility];  Service: Orthopedics;  Laterality: Right;  ? IRRIGATION AND DEBRIDEMENT KNEE Right 04/17/2019  ? Procedure: IRRIGATION AND DEBRIDEMENT RIGHT KNEE;  Surgeon: Shona Needles, MD;  Location: [facility];  Service: Orthopedics;  Laterality: Right;  ? LYMPHADENECTOMY Left   ? ORIF TIBIA PLATEAU Right 01/20/2019  ? Procedure: OPEN REDUCTION INTERNAL FIXATION (ORIF) TIBIAL PLATEAU;  Surgeon: Shona Needles, MD;  Location: [facility];  Service: Orthopedics;  Laterality: Right;  ? SHOULDER SURGERY Left   ? chronic L dislocating   ? ?Social History  ? ?Occupational History  ? Occupation: works in [employer]  ?Tobacco Use  ? Smoking status: Former  ?  Types: Cigarettes  ?  Quit date: 2000  ?  Years since quitting: 23.2  ? Smokeless tobacco: Never  ? Tobacco comments:  ?  in college  ?Vaping Use  ? Vaping Use: Never used  ?Substance and Sexual Activity  ? Alcohol use: Not Currently  ? Drug use: No  ? Sexual activity: Not on file  ? ? ? ? ? ?

## 2022-04-10 ENCOUNTER — Ambulatory Visit: Payer: Medicaid Other | Admitting: Cardiology

## 2022-04-17 ENCOUNTER — Inpatient Hospital Stay (INDEPENDENT_AMBULATORY_CARE_PROVIDER_SITE_OTHER): Payer: Self-pay | Admitting: Primary Care

## 2022-04-26 ENCOUNTER — Other Ambulatory Visit: Payer: Self-pay | Admitting: *Deleted

## 2022-04-26 DIAGNOSIS — M7989 Other specified soft tissue disorders: Secondary | ICD-10-CM

## 2022-04-30 ENCOUNTER — Inpatient Hospital Stay: Payer: Medicaid Other | Admitting: Internal Medicine

## 2022-05-09 ENCOUNTER — Ambulatory Visit (HOSPITAL_COMMUNITY): Admit: 2022-05-09 | Payer: Medicaid Other

## 2022-05-09 ENCOUNTER — Encounter: Payer: Medicaid Other | Admitting: Vascular Surgery

## 2022-05-10 ENCOUNTER — Encounter: Payer: Medicaid Other | Admitting: Vascular Surgery

## 2022-05-10 ENCOUNTER — Encounter (HOSPITAL_COMMUNITY): Payer: Medicaid Other

## 2022-06-12 ENCOUNTER — Encounter: Payer: Self-pay | Admitting: Vascular Surgery

## 2022-06-12 ENCOUNTER — Ambulatory Visit (HOSPITAL_COMMUNITY)
Admission: RE | Admit: 2022-06-12 | Discharge: 2022-06-12 | Disposition: A | Discharge status: Home / Self Care | Payer: PRIVATE HEALTH INSURANCE | Source: Ambulatory Visit | Attending: Vascular Surgery | Admitting: Vascular Surgery

## 2022-06-12 ENCOUNTER — Ambulatory Visit (INDEPENDENT_AMBULATORY_CARE_PROVIDER_SITE_OTHER): Payer: PRIVATE HEALTH INSURANCE | Admitting: Vascular Surgery

## 2022-06-12 VITALS — BP 167/111 | HR 94 | Temp 98.1°F | Resp 18 | Ht 75.0 in | Wt 273.0 lb

## 2022-06-12 DIAGNOSIS — I83012 Varicose veins of right lower extremity with ulcer of calf: Secondary | ICD-10-CM | POA: Diagnosis not present

## 2022-06-12 DIAGNOSIS — L97211 Non-pressure chronic ulcer of right calf limited to breakdown of skin: Secondary | ICD-10-CM

## 2022-06-12 DIAGNOSIS — M7989 Other specified soft tissue disorders: Secondary | ICD-10-CM

## 2022-06-12 NOTE — Progress Notes (Signed)
Patient name: Evan [name] MRN: [id number] DOB: November 14, 1971 Sex: male  REASON FOR CONSULT: Evaluate lower extremity wounds and possible venous insufficiency  HPI: Evan [name] is a 51 y.o. male, with history of melanoma, HTN, treated hepatitis C that presents for evaluation of lower extremity wounds and possible venous insufficiency.  The more pronounced wound is on the right lower extremity.  He states that this happened last September after he had a traumatic injury from a dresser in his bedroom and this created a large tear.  He also has had profound bilateral lower extremity edema that has since gotten better ober the past few months.  He has been evaluated by Dr. Sharol Given and currently in silver impregnated stockings 20-30 mm Hg to the bilateral lower extremities.  He has had significant orthopedic trauma to the right leg including ORIF tibial plateau.   Past Medical History:  Diagnosis Date   ADD (attention deficit disorder) 05/11/2013   ANXIETY 12/10/2007   BACK PAIN 08/03/2009   Chronic pain syndrome 01/28/2014   Depression 06/15/2014   ERECTILE DYSFUNCTION 12/10/2007   Hepatitis C 05/15/2013     "treated and cured"   HYPERTENSION 11/21/2007   INSOMNIA-SLEEP DISORDER-UNSPEC 12/20/2008   Melanoma ([place]) 2015   Lymph nodes left side , radiation   Neuropathy    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   Preventative health care 05/14/2011    Past Surgical History:  Procedure Laterality Date   EXTERNAL FIXATION LEG Right 01/16/2019   Procedure: EXTERNAL FIXATION RIGHT KNEE;  Surgeon: Shona Needles, MD;  Location: [place];  Service: Orthopedics;  Laterality: Right;   HARDWARE REMOVAL Right 04/17/2019   Procedure: HARDWARE REMOVAL RIGHT TIBIA;  Surgeon: Shona Needles, MD;  Location: [place];  Service: Orthopedics;  Laterality: Right;   IRRIGATION AND DEBRIDEMENT KNEE Right 04/17/2019   Procedure: IRRIGATION AND DEBRIDEMENT RIGHT KNEE;  Surgeon: Shona Needles, MD;   Location: [place];  Service: Orthopedics;  Laterality: Right;   LYMPHADENECTOMY Left    ORIF TIBIA PLATEAU Right 01/20/2019   Procedure: OPEN REDUCTION INTERNAL FIXATION (ORIF) TIBIAL PLATEAU;  Surgeon: Shona Needles, MD;  Location: [place];  Service: Orthopedics;  Laterality: Right;   SHOULDER SURGERY Left    chronic L dislocating     Family History  Problem Relation Age of Onset   Cancer Father        melanoma on back   Cancer Other        pancreatic    SOCIAL HISTORY: Social History   Socioeconomic History   Marital status: Divorced    Spouse name: Not on file   Number of children: Not on file   Years of education: Not on file   Highest education level: Not on file  Occupational History   Occupation: works in [employer] Use   Smoking status: Former    Types: Cigarettes    Quit date: 2000    Years since quitting: 23.4   Smokeless tobacco: Never   Tobacco comments:    in college  Vaping Use   Vaping Use: Never used  Substance and Sexual Activity   Alcohol use: Not Currently   Drug use: No   Sexual activity: Not on file  Other Topics Concern   Not on file  Social History Narrative   Not on file   Social Determinants of Health   Financial Resource Strain: Not on file  Food Insecurity: Not on file  Transportation Needs: Not on  file  Physical Activity: Not on file  Stress: Not on file  Social Connections: Not on file  Intimate Partner Violence: Not on file    Allergies  Allergen Reactions   Contrast Media [Iodinated Contrast Media] Shortness Of Breath and Swelling    Shortness of breath., throat swelling   Fish Allergy Anaphylaxis    No reaction to shellfish    Current Outpatient Medications  Medication Sig Dispense Refill   ALPRAZolam (XANAX) 0.5 MG tablet Take 1 tablet (0.5 mg total) by mouth 3 (three) times daily as needed for anxiety. 15 tablet 0   atenolol (TENORMIN) 25 MG tablet Take 1 tablet (25 mg total) by mouth daily. 30 tablet 3    ibuprofen (ADVIL) 200 MG tablet Take 800-1,000 mg by mouth every 4 (four) hours as needed for headache or moderate pain.     losartan (COZAAR) 50 MG tablet Take 1 tablet (50 mg total) by mouth daily. 30 tablet 3   oxyCODONE (OXY IR/ROXICODONE) 5 MG immediate release tablet Take 10 mg by mouth every 4 (four) hours as needed for severe pain.     zolpidem (AMBIEN) 10 MG tablet Take 1 tablet (10 mg total) by mouth at bedtime as needed for sleep. 15 tablet 0   acetaminophen (TYLENOL) 500 MG tablet Take 1 tablet (500 mg total) by mouth every 12 (twelve) hours. (Patient not taking: Reported on 03/26/2022) 60 tablet 1   pantoprazole (PROTONIX) 40 MG tablet Take 1 tablet (40 mg total) by mouth daily. (Patient not taking: Reported on 06/12/2022) 30 tablet 1   No current facility-administered medications for this visit.    REVIEW OF SYSTEMS:  '[X]'$  denotes positive finding, '[ ]'$  denotes negative finding Cardiac  Comments:  Chest pain or chest pressure:    Shortness of breath upon exertion:    Short of breath when lying flat:    Irregular heart rhythm:        Vascular    Pain in calf, thigh, or hip brought on by ambulation:    Pain in feet at night that wakes you up from your sleep:     Blood clot in your veins:    Leg swelling:  x       Pulmonary    Oxygen at home:    Productive cough:     Wheezing:         Neurologic    Sudden weakness in arms or legs:     Sudden numbness in arms or legs:     Sudden onset of difficulty speaking or slurred speech:    Temporary loss of vision in one eye:     Problems with dizziness:         Gastrointestinal    Blood in stool:     Vomited blood:         Genitourinary    Burning when urinating:     Blood in urine:        Psychiatric    Major depression:         Hematologic    Bleeding problems:    Problems with blood clotting too easily:        Skin    Rashes or ulcers:        Constitutional    Fever or chills:      PHYSICAL EXAM: Vitals:    06/12/22 1610 06/12/22 1614  BP: (!) 174/85 (!) 167/111  Pulse: 94 94  Resp: 18   Temp: 98.1 F (36.7 C)  TempSrc: Temporal   SpO2: 93%   Weight: 273 lb (123.8 kg)   Height: '6\' 3"'$  (1.905 m)     GENERAL: The patient is a well-nourished male, in no acute distress. The vital signs are documented above. CARDIAC: There is a regular rate and rhythm.  VASCULAR:  Bilateral femoral pulses palpable Bilateral DP pulses palpable Marked right lower extremity wound as pictured Small healing ulcer at left ankle PULMONARY: No respiratory distress. ABDOMEN: Soft and non-tender. MUSCULOSKELETAL: There are no major deformities or cyanosis. NEUROLOGIC: No focal weakness or paresthesias are detected. PSYCHIATRIC: The patient has a normal affect.     DATA:   Lower Venous Reflux Study   Patient Name:  AQEEL NORGAARD  Date of Exam:   06/12/2022  Medical Rec #: [id number]            Accession #:    [id number]  Date of Birth: 27-Jul-1971            Patient Gender: M  Patient Age:   81 years  Exam Location:  [facility]  Procedure:      VAS [place] LOWER EXTREMITY VENOUS REFLUX  Referring Phys: Harrell Gave DICKSON    ---------------------------------------------------------------------------  -----     Indications: Edema, and venous insufficiency.     Limitations: Body habitus.  Performing Technologist: Alvia Grove RVT      Examination Guidelines: A complete evaluation includes B-mode imaging,  spectral  Doppler, color Doppler, and power Doppler as needed of all accessible  portions  of each vessel. Bilateral testing is considered an integral part of a  complete  examination. Limited examinations for reoccurring indications may be  performed  as noted. The reflux portion of the exam is performed with the patient in  reverse Trendelenburg.  Significant venous reflux is defined as >500 ms in the superficial venous  system, and >1 second in the deep venous system.       Venous Reflux Times  +--------------+---------+------+-----------+------------+-----------------  --+  RIGHT         Reflux NoRefluxReflux TimeDiameter cmsComments                                      Yes                                               +--------------+---------+------+-----------+------------+-----------------  --+  CFV                     yes   >1 second             Enlarged lymph  node  +--------------+---------+------+-----------+------------+-----------------  --+  FV mid        no                                                          +--------------+---------+------+-----------+------------+-----------------  --+  Popliteal               yes   >1 second                                   +--------------+---------+------+-----------+------------+-----------------  --+  GSV at [facility]              yes    >500 ms      0.51                          +--------------+---------+------+-----------+------------+-----------------  --+  GSV prox thigh          yes    >500 ms      0.52                          +--------------+---------+------+-----------+------------+-----------------  --+  GSV mid thigh no                            0.47                          +--------------+---------+------+-----------+------------+-----------------  --+  GSV dist thighno                            0.44                          +--------------+---------+------+-----------+------------+-----------------  --+  GSV at knee   no                            0.40                          +--------------+---------+------+-----------+------------+-----------------  --+  GSV prox calf no                            0.42                          +--------------+---------+------+-----------+------------+-----------------  --+  GSV mid calf            yes    >500 ms      0.32                           +--------------+---------+------+-----------+------------+-----------------  --+  SSV Pop Fossa           yes    >500 ms      0.51                          +--------------+---------+------+-----------+------------+-----------------  --+  SSV prox calf           yes    >500 ms      0.46                          +--------------+---------+------+-----------+------------+-----------------  --+  SSV mid calf  no                            0.35                          +--------------+---------+------+-----------+------------+-----------------  --+  AASV o        no  0.38                          +--------------+---------+------+-----------+------------+-----------------  --+  AASV p        no                            0.38                          +--------------+---------+------+-----------+------------+-----------------  --+      +--------------+---------+------+-----------+------------+-----------------  --+  LEFT          Reflux NoRefluxReflux TimeDiameter cmsComments                                      Yes                                               +--------------+---------+------+-----------+------------+-----------------  --+  CFV                     yes   >1 second             Enlarged lymph  node  +--------------+---------+------+-----------+------------+-----------------  --+  FV mid        no                                                          +--------------+---------+------+-----------+------------+-----------------  --+  Popliteal     no                                                          +--------------+---------+------+-----------+------------+-----------------  --+  GSV at SFJ              yes    >500 ms      0.63                          +--------------+---------+------+-----------+------------+-----------------  --+  GSV prox thighno                             0.39                          +--------------+---------+------+-----------+------------+-----------------  --+  GSV mid thigh no                            0.25                          +--------------+---------+------+-----------+------------+-----------------  --+  GSV dist thighno  0.16                          +--------------+---------+------+-----------+------------+-----------------  --+  GSV at knee   no                            0.23                          +--------------+---------+------+-----------+------------+-----------------  --+  GSV prox calf                                       NV                    +--------------+---------+------+-----------+------------+-----------------  --+  GSV mid calf  no                            0.19                          +--------------+---------+------+-----------+------------+-----------------  --+  SSV Pop Fossa no                            0.18                          +--------------+---------+------+-----------+------------+-----------------  --+  SSV prox calf no                            0.21                          +--------------+---------+------+-----------+------------+-----------------  --+  SSV mid calf  no                            0.16                          +--------------+---------+------+-----------+------------+-----------------  --+  AASV p        no                            0.19                          +--------------+---------+------+-----------+------------+-----------------  --+       Summary:  Right:  - No evidence of deep vein thrombosis seen in the right lower extremity,  from the common femoral through the popliteal veins.  - Venous reflux is noted in the right common femoral vein.  - Venous reflux is noted in the right sapheno-femoral junction.  - Venous reflux  is noted in the right greater saphenous vein in the thigh.  - Venous reflux is noted in the right greater saphenous vein in the calf.  - Venous reflux is noted in the right popliteal vein.  - Venous reflux is noted in the right short saphenous vein.     Left:  - No evidence of deep vein thrombosis seen in the left lower extremity,  from  the common femoral through the popliteal veins.  - No evidence of superficial venous reflux seen in the left greater  saphenous vein.  - No evidence of superficial venous reflux seen in the left short  saphenous vein.  - Venous reflux is noted in the left common femoral vein.  - Venous reflux is noted in the left sapheno-femoral junction.     *See table(s) above for measurements and observations.   Assessment/Plan:  51 year old male with history of melanoma that presents for evaluation of bilateral lower extremity wounds.  The wound is much worse in the right lower extremity as pictured above and he states this is secondary to traumatic injury from a dresser in his bedroom last September.  On exam he has palpable dorsalis pedis pulses bilaterally and had normal ABIs earlier this year with no evidence of arterial insufficiency.  He should have more than adequate inflow to heal his wounds.  He is currently being followed by Dr. Sharol Given for his venous insufficiency and is in a 20 to 30 mm Hg silver impregnated compression stockings bilaterally.  I think this is a perfectly good option as discussed with him today (other option being an Haematologist).  He has significant deep venous reflux in the right lower extremity with evidence of underlying venous insufficiency.  His superficial venous reflux is  short segment in the proximal great saphenous vein and small saphenous vein and I do not see any long segment superficial reflux amendable to ablation.  Discussed no DVT.  I think given the chronicity of his wound it would be reasonable to consider a biopsy to r/o malignancy,  pyoderma, etc.  Dr. Jess Barters last note in April stated he would follow this as an outpatient and may do a biopsy.  I think he should contact Dr. Jess Barters office as I discussed with him today for further follow-up.  We are available as needed.   Marty Heck, MD [facility] Office: [phone]

## 2022-07-11 ENCOUNTER — Ambulatory Visit: Payer: Medicaid Other | Admitting: Family

## 2022-07-23 ENCOUNTER — Encounter: Payer: Self-pay | Admitting: Orthopedic Surgery

## 2022-07-23 ENCOUNTER — Ambulatory Visit (INDEPENDENT_AMBULATORY_CARE_PROVIDER_SITE_OTHER): Payer: PRIVATE HEALTH INSURANCE | Admitting: Orthopedic Surgery

## 2022-07-23 DIAGNOSIS — C439 Malignant melanoma of skin, unspecified: Secondary | ICD-10-CM

## 2022-07-23 DIAGNOSIS — L97211 Non-pressure chronic ulcer of right calf limited to breakdown of skin: Secondary | ICD-10-CM | POA: Diagnosis not present

## 2022-07-23 DIAGNOSIS — I890 Lymphedema, not elsewhere classified: Secondary | ICD-10-CM | POA: Diagnosis not present

## 2022-07-23 DIAGNOSIS — I83012 Varicose veins of right lower extremity with ulcer of calf: Secondary | ICD-10-CM

## 2022-07-23 NOTE — Progress Notes (Signed)
Office Visit Note   Patient: Evan [name]           Date of Birth: [birth year]           MRN: [id number] Visit Date: 07/23/2022              Requested by: No referring provider defined for this encounter. PCP: Pcp, No  Chief Complaint  Patient presents with   Right Leg - Wound Check   Left Leg - Wound Check      HPI: Patient is a 51 year old gentleman who was seen in follow-up for venous stasis ulceration right leg.  Patient has been using compression socks without resolution of the ulcer.  Patient was seen by Dr. Carlis Abbott vascular surgery previous ankle-brachial indices in April showed good triphasic circulation.  Most recent venous reflux studies bilaterally in June showed reflux consistent with venous insufficiency but patient also had enlarged lymph nodes.  Patient does have a history of melanoma.  Assessment & Plan: Visit Diagnoses:  1. Metastatic melanoma   2. Venous stasis ulcer of right calf limited to breakdown of skin with varicose veins (HCC)   3. Lymphedema     Plan: We will apply a 3 layer compression wrap plus silver cell to the wound.  Discussed with the patient if the ulcer responds to the compression wrap then this is most likely an ulcer from venous insufficiency.  Discussed that we may need to proceed with a biopsy if this does not respond as a venous ulcer.  Patient is also had a nuclear medicine study ordered and we will make an appointment with hematology oncology to follow-up with his recent nuclear medicine study.  Follow-Up Instructions: Return in about 1 week (around 07/30/2022).   Ortho Exam  Patient is alert, oriented, no adenopathy, well-dressed, normal affect, normal respiratory effort. Examination patient has healed ulcers on the left leg with brawny skin color changes in both legs consistent with venous insufficiency.  The lateral right calf ulcer is flat measures 30 x 25 mm.  There is no necrotic changes the tissue is healthy.  Discussed  that this appears most consistent with a venous ulcer but with his history of melanoma we may need to proceed with a biopsy if this does not respond well to compression in the way of the left leg did.  Imaging: No results found. No images are attached to the encounter.  Labs: Lab Results  Component Value Date   HGBA1C 5.6 04/17/2019   ESRSEDRATE 72 (H) 05/19/2019   ESRSEDRATE 59 (H) 05/05/2019   ESRSEDRATE 75 (H) 04/17/2019   CRP 65.7 (H) 06/23/2019   CRP 61.5 (H) 06/04/2019   CRP 47.8 (H) 05/26/2019   LABURIC 5.6 03/26/2022   REPTSTATUS 04/01/2022 FINAL 03/26/2022   GRAMSTAIN  04/17/2019    RARE WBC PRESENT,BOTH PMN AND MONONUCLEAR NO ORGANISMS SEEN    CULT  03/26/2022    NO GROWTH 5 DAYS Performed at [facility], [place] [address], [place], [place] [address]      Lab Results  Component Value Date   ALBUMIN 3.8 10/17/2020   ALBUMIN 2.4 (L) 05/06/2019   ALBUMIN 2.4 (L) 04/17/2019    No results found for: "MG" Lab Results  Component Value Date   VD25OH 32.0 04/17/2019    No results found for: "PREALBUMIN"    Latest Ref Rng & Units 03/28/2022    4:33 AM 03/27/2022    5:55 AM 03/26/2022    5:40 AM  CBC EXTENDED  WBC 4.0 - 10.5 K/uL 3.7  5.6  10.9   RBC 4.22 - 5.81 MIL/uL 3.87  3.77  4.63   Hemoglobin 13.0 - 17.0 g/dL 10.9  10.4  12.9   HCT 39.0 - 52.0 % 33.7  32.6  41.3   Platelets 150 - 400 K/uL 261  236  328      There is no height or weight on file to calculate BMI.  Orders:  Orders Placed This Encounter  Procedures   Ambulatory referral to Hematology / Oncology   No orders of the defined types were placed in this encounter.    Procedures: No procedures performed  Clinical Data: No additional findings.  ROS:  All other systems negative, except as noted in the HPI. Review of Systems  Objective: Vital Signs: There were no vitals taken for this visit.  Specialty Comments:  No specialty comments available.  PMFS History: Patient Active  Problem List   Diagnosis Date Noted   Venous stasis ulcer of calf limited to breakdown of skin with varicose veins (HCC)    Cellulitis 03/27/2022   AKI (acute kidney injury) ([place]) 03/26/2022   Multiple open wounds of lower leg 03/26/2022   Dyspnea on exertion 03/26/2022   Great toe pain, right 03/26/2022   IV drug abuse ([place])    Septic arthritis of knee, right ([place]) 29/79/8921   Hardware complicating wound infection ([place]) 04/17/2019   Hepatitis C virus infection cured after antiviral drug therapy 04/17/2019   Acute lateral meniscus tear of right knee 01/23/2019   Displaced fracture of right tibial tuberosity, initial encounter for closed fracture 01/16/2019   Closed bicondylar fracture of right tibial plateau 01/15/2019   Cough 10/31/2016   Depression 06/15/2014   Acute bronchitis 01/28/2014   Chronic pain syndrome 01/28/2014   ADD (attention deficit disorder) 05/11/2013   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   Eczema 12/30/2012   Metastatic melanoma 10/17/2011   Chest pain 10/17/2011   Insomnia 12/20/2008   Anxiety state 12/10/2007   ERECTILE DYSFUNCTION 12/10/2007   Essential hypertension 11/21/2007   Past Medical History:  Diagnosis Date   ADD (attention deficit disorder) 05/11/2013   ANXIETY 12/10/2007   BACK PAIN 08/03/2009   Chronic pain syndrome 01/28/2014   Depression 06/15/2014   ERECTILE DYSFUNCTION 12/10/2007   Hepatitis C 05/15/2013     "treated and cured"   HYPERTENSION 11/21/2007   INSOMNIA-SLEEP DISORDER-UNSPEC 12/20/2008   Melanoma ([place]) 2015   Lymph nodes left side , radiation   Neuropathy    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   Preventative health care 05/14/2011    Family History  Problem Relation Age of Onset   Cancer Father        melanoma on back   Cancer Other        pancreatic    Past Surgical History:  Procedure Laterality Date   EXTERNAL FIXATION LEG Right 01/16/2019   Procedure: EXTERNAL FIXATION RIGHT KNEE;   Surgeon: Shona Needles, MD;  Location: [place];  Service: Orthopedics;  Laterality: Right;   HARDWARE REMOVAL Right 04/17/2019   Procedure: HARDWARE REMOVAL RIGHT TIBIA;  Surgeon: Shona Needles, MD;  Location: [place];  Service: Orthopedics;  Laterality: Right;   IRRIGATION AND DEBRIDEMENT KNEE Right 04/17/2019   Procedure: IRRIGATION AND DEBRIDEMENT RIGHT KNEE;  Surgeon: Shona Needles, MD;  Location: [place];  Service: Orthopedics;  Laterality: Right;   LYMPHADENECTOMY Left    ORIF TIBIA PLATEAU Right 01/20/2019   Procedure: OPEN REDUCTION INTERNAL  FIXATION (ORIF) TIBIAL PLATEAU;  Surgeon: Shona Needles, MD;  Location: [place];  Service: Orthopedics;  Laterality: Right;   SHOULDER SURGERY Left    chronic L dislocating    Social History   Occupational History   Occupation: works in [employer] Use   Smoking status: Former    Types: Cigarettes    Quit date: 2000    Years since quitting: 23.5   Smokeless tobacco: Never   Tobacco comments:    in college  Vaping Use   Vaping Use: Never used  Substance and Sexual Activity   Alcohol use: Not Currently   Drug use: No   Sexual activity: Not on file

## 2022-07-30 ENCOUNTER — Ambulatory Visit: Payer: No Typology Code available for payment source | Admitting: Orthopedic Surgery

## 2022-08-07 ENCOUNTER — Ambulatory Visit: Payer: Self-pay

## 2022-08-08 ENCOUNTER — Ambulatory Visit (INDEPENDENT_AMBULATORY_CARE_PROVIDER_SITE_OTHER): Payer: PRIVATE HEALTH INSURANCE | Admitting: Family

## 2022-08-08 ENCOUNTER — Encounter: Payer: Self-pay | Admitting: Family

## 2022-08-08 DIAGNOSIS — I890 Lymphedema, not elsewhere classified: Secondary | ICD-10-CM | POA: Diagnosis not present

## 2022-08-08 DIAGNOSIS — I87331 Chronic venous hypertension (idiopathic) with ulcer and inflammation of right lower extremity: Secondary | ICD-10-CM

## 2022-08-08 NOTE — Progress Notes (Signed)
Office Visit Note   Patient: Evan [name]           Date of Birth: [birth year]           MRN: [id number] Visit Date: 08/08/2022              Requested by: No referring provider defined for this encounter. PCP: Pcp, No  Chief Complaint  Patient presents with   Right Leg - Wound Check      HPI: The patient is a 51 year old gentleman seen today in follow-up for venous stasis ulceration to the right leg.  Today he has a dry dressing on.  He did have ABIs done in April which showed good triphasic circulation has previously been poorly compliant with 3 layer compression wrapping he questions whether we will be biopsying his wound today  Assessment & Plan: Visit Diagnoses: No diagnosis found.  Plan: We will apply a Unna Dynaflex compression to the right lower extremity discussed lymphedema venous insufficiency at length.  Offered a arm sleeve for his lymphedema to his right upper extremity  Follow-Up Instructions: No follow-ups on file.   Ortho Exam  Patient is alert, oriented, no adenopathy, well-dressed, normal affect, normal respiratory effort. On examination of the right lower extremity there is 2+ pitting edema with no erythema no warmth.  There is a anterior ulceration which is measuring 3 cm in diameter this is superficial there is bleeding no surrounding erythema  Imaging: No results found.   Labs: Lab Results  Component Value Date   HGBA1C 5.6 04/17/2019   ESRSEDRATE 72 (H) 05/19/2019   ESRSEDRATE 59 (H) 05/05/2019   ESRSEDRATE 75 (H) 04/17/2019   CRP 65.7 (H) 06/23/2019   CRP 61.5 (H) 06/04/2019   CRP 47.8 (H) 05/26/2019   LABURIC 5.6 03/26/2022   REPTSTATUS 04/01/2022 FINAL 03/26/2022   GRAMSTAIN  04/17/2019    RARE WBC PRESENT,BOTH PMN AND MONONUCLEAR NO ORGANISMS SEEN    CULT  03/26/2022    NO GROWTH 5 DAYS Performed at [facility], [place] [address], [place], [place] [address]      Lab Results  Component Value Date   ALBUMIN 3.8  10/17/2020   ALBUMIN 2.4 (L) 05/06/2019   ALBUMIN 2.4 (L) 04/17/2019    No results found for: "MG" Lab Results  Component Value Date   VD25OH 32.0 04/17/2019    No results found for: "PREALBUMIN"    Latest Ref Rng & Units 03/28/2022    4:33 AM 03/27/2022    5:55 AM 03/26/2022    5:40 AM  CBC EXTENDED  WBC 4.0 - 10.5 K/uL 3.7  5.6  10.9   RBC 4.22 - 5.81 MIL/uL 3.87  3.77  4.63   Hemoglobin 13.0 - 17.0 g/dL 10.9  10.4  12.9   HCT 39.0 - 52.0 % 33.7  32.6  41.3   Platelets 150 - 400 K/uL 261  236  328      There is no height or weight on file to calculate BMI.  Orders:  No orders of the defined types were placed in this encounter.  No orders of the defined types were placed in this encounter.    Procedures: No procedures performed  Clinical Data: No additional findings.  ROS:  All other systems negative, except as noted in the HPI. Review of Systems  Constitutional:  Negative for chills and fever.  Skin:  Positive for wound. Negative for color change.  Neurological:  Negative for weakness and numbness.  Objective: Vital Signs: There were no vitals taken for this visit.  Specialty Comments:  No specialty comments available.  PMFS History: Patient Active Problem List   Diagnosis Date Noted   Venous stasis ulcer of calf limited to breakdown of skin with varicose veins (HCC)    Cellulitis 03/27/2022   AKI (acute kidney injury) ([place]) 03/26/2022   Multiple open wounds of lower leg 03/26/2022   Dyspnea on exertion 03/26/2022   Great toe pain, right 03/26/2022   IV drug abuse ([place])    Septic arthritis of knee, right ([place]) 18/56/3149   Hardware complicating wound infection ([place]) 04/17/2019   Hepatitis C virus infection cured after antiviral drug therapy 04/17/2019   Acute lateral meniscus tear of right knee 01/23/2019   Displaced fracture of right tibial tuberosity, initial encounter for closed fracture 01/16/2019   Closed bicondylar fracture of right tibial  plateau 01/15/2019   Cough 10/31/2016   Depression 06/15/2014   Acute bronchitis 01/28/2014   Chronic pain syndrome 01/28/2014   ADD (attention deficit disorder) 05/11/2013   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   Eczema 12/30/2012   Metastatic melanoma 10/17/2011   Chest pain 10/17/2011   Insomnia 12/20/2008   Anxiety state 12/10/2007   ERECTILE DYSFUNCTION 12/10/2007   Essential hypertension 11/21/2007   Past Medical History:  Diagnosis Date   ADD (attention deficit disorder) 05/11/2013   ANXIETY 12/10/2007   BACK PAIN 08/03/2009   Chronic pain syndrome 01/28/2014   Depression 06/15/2014   ERECTILE DYSFUNCTION 12/10/2007   Hepatitis C 05/15/2013     "treated and cured"   HYPERTENSION 11/21/2007   INSOMNIA-SLEEP DISORDER-UNSPEC 12/20/2008   Melanoma ([place]) 2015   Lymph nodes left side , radiation   Neuropathy    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   Preventative health care 05/14/2011    Family History  Problem Relation Age of Onset   Cancer Father        melanoma on back   Cancer Other        pancreatic    Past Surgical History:  Procedure Laterality Date   EXTERNAL FIXATION LEG Right 01/16/2019   Procedure: EXTERNAL FIXATION RIGHT KNEE;  Surgeon: Shona Needles, MD;  Location: [place];  Service: Orthopedics;  Laterality: Right;   HARDWARE REMOVAL Right 04/17/2019   Procedure: HARDWARE REMOVAL RIGHT TIBIA;  Surgeon: Shona Needles, MD;  Location: [place];  Service: Orthopedics;  Laterality: Right;   IRRIGATION AND DEBRIDEMENT KNEE Right 04/17/2019   Procedure: IRRIGATION AND DEBRIDEMENT RIGHT KNEE;  Surgeon: Shona Needles, MD;  Location: [place];  Service: Orthopedics;  Laterality: Right;   LYMPHADENECTOMY Left    ORIF TIBIA PLATEAU Right 01/20/2019   Procedure: OPEN REDUCTION INTERNAL FIXATION (ORIF) TIBIAL PLATEAU;  Surgeon: Shona Needles, MD;  Location: [place];  Service: Orthopedics;  Laterality: Right;   SHOULDER SURGERY Left    chronic L  dislocating    Social History   Occupational History   Occupation: works in [employer] Use   Smoking status: Former    Types: Cigarettes    Quit date: 2000    Years since quitting: 23.6   Smokeless tobacco: Never   Tobacco comments:    in college  Vaping Use   Vaping Use: Never used  Substance and Sexual Activity   Alcohol use: Not Currently   Drug use: No   Sexual activity: Not on file

## 2022-08-13 ENCOUNTER — Telehealth: Payer: Self-pay | Admitting: Oncology

## 2022-08-13 NOTE — Telephone Encounter (Signed)
Scheduled appt per 7/31 referral. Pt is aware of appt date and time. Pt is aware to arrive 15 mins prior to appt time and to bring and updated insurance card. Pt is aware of appt location.   

## 2022-08-15 ENCOUNTER — Encounter: Payer: Self-pay | Admitting: Family

## 2022-08-15 ENCOUNTER — Ambulatory Visit (INDEPENDENT_AMBULATORY_CARE_PROVIDER_SITE_OTHER): Payer: PRIVATE HEALTH INSURANCE | Admitting: Family

## 2022-08-15 DIAGNOSIS — I87331 Chronic venous hypertension (idiopathic) with ulcer and inflammation of right lower extremity: Secondary | ICD-10-CM

## 2022-08-15 NOTE — Progress Notes (Signed)
Office Visit Note   Patient: Evan [name]           Date of Birth: [birth year]           MRN: [id number] Visit Date: 08/15/2022              Requested by: No referring provider defined for this encounter. PCP: Pcp, No  Chief Complaint  Patient presents with   Right Leg - Wound Check      HPI: The patient is a 51 year old gentleman seen today in follow-up for venous stasis ulceration to the right leg.  Has been in and Dynaflex for the last 1 week.  He states this was uncomfortable but he will be able to tolerate serial compression wrapping.  Assessment & Plan: Visit Diagnoses: No diagnosis found.  Plan: We will apply a Unna Dynaflex compression to the right lower extremity.  Silver cell to the wound.  Follow-up in 1 week.   Follow-Up Instructions: No follow-ups on file.   Ortho Exam  Patient is alert, oriented, no adenopathy, well-dressed, normal affect, normal respiratory effort. On examination of the right lower extremity good wrinkling of the skin from compression with no erythema no warmth.  Circumferential epithelialization of the ulcer which is reduced in size.  There is a anterior ulceration which is measuring 2 cm in diameter this is superficial there is bleeding no surrounding erythema  Imaging: No results found.  Labs: Lab Results  Component Value Date   HGBA1C 5.6 04/17/2019   ESRSEDRATE 72 (H) 05/19/2019   ESRSEDRATE 59 (H) 05/05/2019   ESRSEDRATE 75 (H) 04/17/2019   CRP 65.7 (H) 06/23/2019   CRP 61.5 (H) 06/04/2019   CRP 47.8 (H) 05/26/2019   LABURIC 5.6 03/26/2022   REPTSTATUS 04/01/2022 FINAL 03/26/2022   GRAMSTAIN  04/17/2019    RARE WBC PRESENT,BOTH PMN AND MONONUCLEAR NO ORGANISMS SEEN    CULT  03/26/2022    NO GROWTH 5 DAYS Performed at [facility], [place] [address] [place], [place] [address]      Lab Results  Component Value Date   ALBUMIN 3.8 10/17/2020   ALBUMIN 2.4 (L) 05/06/2019   ALBUMIN 2.4 (L) 04/17/2019    No  results found for: "MG" Lab Results  Component Value Date   VD25OH 32.0 04/17/2019    No results found for: "PREALBUMIN"    Latest Ref Rng & Units 03/28/2022    4:33 AM 03/27/2022    5:55 AM 03/26/2022    5:40 AM  CBC EXTENDED  WBC 4.0 - 10.5 K/uL 3.7  5.6  10.9   RBC 4.22 - 5.81 MIL/uL 3.87  3.77  4.63   Hemoglobin 13.0 - 17.0 g/dL 10.9  10.4  12.9   HCT 39.0 - 52.0 % 33.7  32.6  41.3   Platelets 150 - 400 K/uL 261  236  328      There is no height or weight on file to calculate BMI.  Orders:  No orders of the defined types were placed in this encounter.  No orders of the defined types were placed in this encounter.    Procedures: No procedures performed  Clinical Data: No additional findings.  ROS:  All other systems negative, except as noted in the HPI. Review of Systems  Constitutional:  Negative for chills and fever.  Skin:  Positive for wound. Negative for color change.  Neurological:  Negative for weakness and numbness.    Objective: Vital Signs: There were no vitals taken  for this visit.  Specialty Comments:  No specialty comments available.  PMFS History: Patient Active Problem List   Diagnosis Date Noted   Venous stasis ulcer of calf limited to breakdown of skin with varicose veins (HCC)    Cellulitis 03/27/2022   AKI (acute kidney injury) ([place]) 03/26/2022   Multiple open wounds of lower leg 03/26/2022   Dyspnea on exertion 03/26/2022   Great toe pain, right 03/26/2022   IV drug abuse ([place])    Septic arthritis of knee, right ([place]) 23/76/2831   Hardware complicating wound infection ([place]) 04/17/2019   Hepatitis C virus infection cured after antiviral drug therapy 04/17/2019   Acute lateral meniscus tear of right knee 01/23/2019   Displaced fracture of right tibial tuberosity, initial encounter for closed fracture 01/16/2019   Closed bicondylar fracture of right tibial plateau 01/15/2019   Cough 10/31/2016   Depression 06/15/2014   Acute bronchitis  01/28/2014   Chronic pain syndrome 01/28/2014   ADD (attention deficit disorder) 05/11/2013   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   Eczema 12/30/2012   Metastatic melanoma 10/17/2011   Chest pain 10/17/2011   Insomnia 12/20/2008   Anxiety state 12/10/2007   ERECTILE DYSFUNCTION 12/10/2007   Essential hypertension 11/21/2007   Past Medical History:  Diagnosis Date   ADD (attention deficit disorder) 05/11/2013   ANXIETY 12/10/2007   BACK PAIN 08/03/2009   Chronic pain syndrome 01/28/2014   Depression 06/15/2014   ERECTILE DYSFUNCTION 12/10/2007   Hepatitis C 05/15/2013     "treated and cured"   HYPERTENSION 11/21/2007   INSOMNIA-SLEEP DISORDER-UNSPEC 12/20/2008   Melanoma ([place]) 2015   Lymph nodes left side , radiation   Neuropathy    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   Preventative health care 05/14/2011    Family History  Problem Relation Age of Onset   Cancer Father        melanoma on back   Cancer Other        pancreatic    Past Surgical History:  Procedure Laterality Date   EXTERNAL FIXATION LEG Right 01/16/2019   Procedure: EXTERNAL FIXATION RIGHT KNEE;  Surgeon: Shona Needles, MD;  Location: [place];  Service: Orthopedics;  Laterality: Right;   HARDWARE REMOVAL Right 04/17/2019   Procedure: HARDWARE REMOVAL RIGHT TIBIA;  Surgeon: Shona Needles, MD;  Location: [place];  Service: Orthopedics;  Laterality: Right;   IRRIGATION AND DEBRIDEMENT KNEE Right 04/17/2019   Procedure: IRRIGATION AND DEBRIDEMENT RIGHT KNEE;  Surgeon: Shona Needles, MD;  Location: [facility];  Service: Orthopedics;  Laterality: Right;   LYMPHADENECTOMY Left    ORIF TIBIA PLATEAU Right 01/20/2019   Procedure: OPEN REDUCTION INTERNAL FIXATION (ORIF) TIBIAL PLATEAU;  Surgeon: Shona Needles, MD;  Location: [facility];  Service: Orthopedics;  Laterality: Right;   SHOULDER SURGERY Left    chronic L dislocating    Social History   Occupational History   Occupation: works in [employer] [place] Use   Smoking status: Former    Types: Cigarettes    Quit date: 2000    Years since quitting: 23.6   Smokeless tobacco: Never   Tobacco comments:    in college  Vaping Use   Vaping Use: Never used  Substance and Sexual Activity   Alcohol use: Not Currently   Drug use: No   Sexual activity: Not on file

## 2022-08-22 ENCOUNTER — Encounter: Payer: Self-pay | Admitting: Family

## 2022-08-22 ENCOUNTER — Ambulatory Visit (INDEPENDENT_AMBULATORY_CARE_PROVIDER_SITE_OTHER): Payer: PRIVATE HEALTH INSURANCE | Admitting: Family

## 2022-08-22 DIAGNOSIS — I83002 Varicose veins of unspecified lower extremity with ulcer of calf: Secondary | ICD-10-CM

## 2022-08-22 DIAGNOSIS — L97201 Non-pressure chronic ulcer of unspecified calf limited to breakdown of skin: Secondary | ICD-10-CM

## 2022-08-22 NOTE — Progress Notes (Signed)
Office Visit Note   Patient: Evan [name]           Date of Birth: 1971/09/06           MRN: [id number] Visit Date: 08/22/2022              Requested by: No referring provider defined for this encounter. PCP: Pcp, No  Chief Complaint  Patient presents with   Right Leg - Follow-up      HPI: The patient is a 51 year old gentleman seen today in follow-up for venous stasis ulceration to the right leg.  Has been in unna and Dynaflex for the last 1 week. Serial compression wrapping. Is pleased with progress.  Having issues with personal mobility and transportation due to significant lower extremity edema and discomfort. Must spend majority of waking hours with lower extremities elevated due to lymphedema.  Assessment & Plan: Visit Diagnoses: No diagnosis found.  Plan: We will apply a Dynaflex compression to the right lower extremity.  Follow-up in 1 week.   Follow-Up Instructions: Return in about 1 week (around 08/29/2022).   Ortho Exam  Patient is alert, oriented, no adenopathy, well-dressed, normal affect, normal respiratory effort. On examination of the right lower extremity good wrinkling of the skin from compression with no erythema no warmth. The wound is decreased in size. Today is covered with eschar. Is 10 mm in diameter. No drainage. No surrounding erythema  Imaging: No results found.  Labs: Lab Results  Component Value Date   HGBA1C 5.6 04/17/2019   ESRSEDRATE 72 (H) 05/19/2019   ESRSEDRATE 59 (H) 05/05/2019   ESRSEDRATE 75 (H) 04/17/2019   CRP 65.7 (H) 06/23/2019   CRP 61.5 (H) 06/04/2019   CRP 47.8 (H) 05/26/2019   LABURIC 5.6 03/26/2022   REPTSTATUS 04/01/2022 FINAL 03/26/2022   GRAMSTAIN  04/17/2019    RARE WBC PRESENT,BOTH PMN AND MONONUCLEAR NO ORGANISMS SEEN    CULT  03/26/2022    NO GROWTH 5 DAYS Performed at [facility], [place] [address] [place], [place] [address]      Lab Results  Component Value Date   ALBUMIN 3.8  10/17/2020   ALBUMIN 2.4 (L) 05/06/2019   ALBUMIN 2.4 (L) 04/17/2019    No results found for: "MG" Lab Results  Component Value Date   VD25OH 32.0 04/17/2019    No results found for: "PREALBUMIN"    Latest Ref Rng & Units 03/28/2022    4:33 AM 03/27/2022    5:55 AM 03/26/2022    5:40 AM  CBC EXTENDED  WBC 4.0 - 10.5 K/uL 3.7  5.6  10.9   RBC 4.22 - 5.81 MIL/uL 3.87  3.77  4.63   Hemoglobin 13.0 - 17.0 g/dL 10.9  10.4  12.9   HCT 39.0 - 52.0 % 33.7  32.6  41.3   Platelets 150 - 400 K/uL 261  236  328      There is no height or weight on file to calculate BMI.  Orders:  No orders of the defined types were placed in this encounter.  No orders of the defined types were placed in this encounter.    Procedures: No procedures performed  Clinical Data: No additional findings.  ROS:  All other systems negative, except as noted in the HPI. Review of Systems  Constitutional:  Negative for chills and fever.  Skin:  Positive for wound. Negative for color change.  Neurological:  Negative for weakness and numbness.    Objective: Vital Signs: There  were no vitals taken for this visit.  Specialty Comments:  No specialty comments available.  PMFS History: Patient Active Problem List   Diagnosis Date Noted   Venous stasis ulcer of calf limited to breakdown of skin with varicose veins (HCC)    Cellulitis 03/27/2022   AKI (acute kidney injury) ([place]) 03/26/2022   Multiple open wounds of lower leg 03/26/2022   Dyspnea on exertion 03/26/2022   Great toe pain, right 03/26/2022   IV drug abuse ([place])    Septic arthritis of knee, right ([place]) 82/95/6213   Hardware complicating wound infection ([place]) 04/17/2019   Hepatitis C virus infection cured after antiviral drug therapy 04/17/2019   Acute lateral meniscus tear of right knee 01/23/2019   Displaced fracture of right tibial tuberosity, initial encounter for closed fracture 01/16/2019   Closed bicondylar fracture of right tibial  plateau 01/15/2019   Cough 10/31/2016   Depression 06/15/2014   Acute bronchitis 01/28/2014   Chronic pain syndrome 01/28/2014   ADD (attention deficit disorder) 05/11/2013   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   Eczema 12/30/2012   Metastatic melanoma 10/17/2011   Chest pain 10/17/2011   Insomnia 12/20/2008   Anxiety state 12/10/2007   ERECTILE DYSFUNCTION 12/10/2007   Essential hypertension 11/21/2007   Past Medical History:  Diagnosis Date   ADD (attention deficit disorder) 05/11/2013   ANXIETY 12/10/2007   BACK PAIN 08/03/2009   Chronic pain syndrome 01/28/2014   Depression 06/15/2014   ERECTILE DYSFUNCTION 12/10/2007   Hepatitis C 05/15/2013     "treated and cured"   HYPERTENSION 11/21/2007   INSOMNIA-SLEEP DISORDER-UNSPEC 12/20/2008   Melanoma ([place]) 2015   Lymph nodes left side , radiation   Neuropathy    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   Preventative health care 05/14/2011    Family History  Problem Relation Age of Onset   Cancer Father        melanoma on back   Cancer Other        pancreatic    Past Surgical History:  Procedure Laterality Date   EXTERNAL FIXATION LEG Right 01/16/2019   Procedure: EXTERNAL FIXATION RIGHT KNEE;  Surgeon: Shona Needles, MD;  Location: [facility];  Service: Orthopedics;  Laterality: Right;   HARDWARE REMOVAL Right 04/17/2019   Procedure: HARDWARE REMOVAL RIGHT TIBIA;  Surgeon: Shona Needles, MD;  Location: [place];  Service: Orthopedics;  Laterality: Right;   IRRIGATION AND DEBRIDEMENT KNEE Right 04/17/2019   Procedure: IRRIGATION AND DEBRIDEMENT RIGHT KNEE;  Surgeon: Shona Needles, MD;  Location: [place];  Service: Orthopedics;  Laterality: Right;   LYMPHADENECTOMY Left    ORIF TIBIA PLATEAU Right 01/20/2019   Procedure: OPEN REDUCTION INTERNAL FIXATION (ORIF) TIBIAL PLATEAU;  Surgeon: Shona Needles, MD;  Location: [place];  Service: Orthopedics;  Laterality: Right;   SHOULDER SURGERY Left    chronic L  dislocating    Social History   Occupational History   Occupation: works in [employer] Use   Smoking status: Former    Types: Cigarettes    Quit date: 2000    Years since quitting: 23.6   Smokeless tobacco: Never   Tobacco comments:    in college  Vaping Use   Vaping Use: Never used  Substance and Sexual Activity   Alcohol use: Not Currently   Drug use: No   Sexual activity: Not on file

## 2022-08-24 ENCOUNTER — Inpatient Hospital Stay: Payer: No Typology Code available for payment source | Attending: Oncology | Admitting: Oncology

## 2022-08-24 ENCOUNTER — Other Ambulatory Visit: Payer: Self-pay

## 2022-08-24 VITALS — BP 83/61 | HR 50 | Temp 97.5°F | Resp 18 | Wt 277.2 lb

## 2022-08-24 DIAGNOSIS — Z08 Encounter for follow-up examination after completed treatment for malignant neoplasm: Secondary | ICD-10-CM | POA: Diagnosis not present

## 2022-08-24 DIAGNOSIS — Z85820 Personal history of malignant melanoma of skin: Secondary | ICD-10-CM

## 2022-08-24 DIAGNOSIS — C439 Malignant melanoma of skin, unspecified: Secondary | ICD-10-CM

## 2022-08-24 NOTE — Progress Notes (Signed)
Reason for the request:    Melanoma  HPI: I was asked by Dr. Sharol Given to evaluate Evan [name] for evaluation of melanoma.  He is a 51 year old man diagnosed with melanoma in 2009.  He had a skin lesion at the right lower quadrant.  Biopsy at that time showed a superficial spreading melanoma with ulceration measuring 1.25 mm.  He underwent wide excision with the final staging was T2BN0 stage IIa.  He developed axillary melanoma recurrence in 2012.  He underwent lymph node dissection and found to have 1 out of 26 lymph node that was positive with the largest node measuring 4.8 cm.  He received radiation therapy between November and December 2012 to the right axilla.  He subsequently treated with adjuvant ipilimumab up till February 2013.  Therapy was discontinued related to cellulitis in his hand.  She did develop acute hepatitis which could be related to ipilimumab in 2013.  He continues to be on active surveillance under the care of Dr. Harriet [name] till 2353 without any evidence of relapsed disease.    He sustained a injury to his right lower extremity and subsequently developed a known healing ulcer since that time for the last 2 years. He was hospitalized in April 2023 with dyspnea on exertion and multiple open wounds of this lower extremity as well as acute kidney injury.  He continues to follow with vascular surgery for his open wounds as well as orthopedic surgery.  His most recent imaging studies obtained in April 2023 did not show any evidence of relapsed disease.  The right axillary dissection was noted with a subtle increase in soft tissue along the medial margin of the area compared to previous imaging in 2014.  Radiation changes to the right upper lobe.  Clinically, he reports  He does not report any headaches, blurry vision, syncope or seizures. Does not report any fevers, chills or sweats.  Does not report any cough, wheezing or hemoptysis.  Does not report any chest pain, palpitation, orthopnea or  leg edema.  Does not report any nausea, vomiting or abdominal pain.  Does not report any constipation or diarrhea.  Does not report any skeletal complaints.    Does not report frequency, urgency or hematuria.  Does not report any skin rashes or lesions. Does not report any heat or cold intolerance.  Does not report any lymphadenopathy or petechiae.  Does not report any anxiety or depression.  Remaining review of systems is negative.     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   Preventative health care 05/14/2011  :   Past Surgical History:  Procedure Laterality Date   EXTERNAL FIXATION LEG Right 01/16/2019   Procedure: EXTERNAL FIXATION RIGHT KNEE;  Surgeon: Shona Needles, MD;  Location: [facility];  Service: Orthopedics;  Laterality: Right;   HARDWARE REMOVAL Right 04/17/2019   Procedure: HARDWARE REMOVAL RIGHT TIBIA;  Surgeon: Shona Needles, MD;  Location: [facility];  Service: Orthopedics;  Laterality: Right;   IRRIGATION AND DEBRIDEMENT KNEE Right 04/17/2019   Procedure: IRRIGATION AND DEBRIDEMENT RIGHT KNEE;  Surgeon: Shona Needles, MD;  Location: [facility];  Service: Orthopedics;  Laterality: Right;   LYMPHADENECTOMY Left    ORIF TIBIA PLATEAU Right 01/20/2019   Procedure: OPEN REDUCTION INTERNAL FIXATION (ORIF) TIBIAL PLATEAU;  Surgeon: Shona Needles, MD;  Location: [facility];  Service: Orthopedics;  Laterality: Right;   SHOULDER SURGERY Left    chronic L dislocating   :   Current Outpatient Medications:    acetaminophen (TYLENOL) 500 MG tablet, Take 1 tablet (500 mg total)  by mouth every 12 (twelve) hours. (Patient not taking: Reported on 03/26/2022), Disp: 60 tablet, Rfl: 1   ALPRAZolam (XANAX) 0.5 MG tablet, Take 1 tablet (0.5 mg total) by mouth 3 (three) times daily as needed for anxiety., Disp: 15 tablet, Rfl: 0   atenolol (TENORMIN) 25 MG tablet, Take 1 tablet (25 mg total) by mouth daily., Disp: 30 tablet, Rfl: 3   ibuprofen (ADVIL) 200 MG tablet, Take 800-1,000 mg by mouth every 4 (four) hours as needed for headache or moderate pain., Disp: , Rfl:    losartan (COZAAR) 50 MG tablet, Take 1 tablet (50 mg total) by mouth daily., Disp: 30 tablet, Rfl: 3   oxyCODONE (OXY IR/ROXICODONE) 5 MG immediate release tablet, Take 10 mg by mouth every 4 (four) hours as needed for severe pain., Disp: , Rfl:    pantoprazole (PROTONIX) 40 MG tablet, Take 1 tablet (40 mg total) by mouth daily. (Patient not taking: Reported on 06/12/2022), Disp: 30 tablet, Rfl: 1   zolpidem (AMBIEN) 10 MG tablet, Take 1 tablet (10 mg total) by mouth at bedtime as needed for sleep., Disp: 15 tablet, Rfl: 0:   Allergies  Allergen Reactions   Contrast Media [Iodinated Contrast Media] Shortness Of Breath and Swelling    Shortness of breath., throat swelling   Fish Allergy Anaphylaxis    No reaction to shellfish  :   Family History  Problem Relation Age of Onset   Cancer Father        melanoma on back   Cancer Other        pancreatic  :   Social History   Socioeconomic History   Marital status: Divorced    Spouse name: Not on file   Number of children: Not on file   Years of education: Not on file   Highest education level: Not on file  Occupational History   Occupation: works in [place] Use   Smoking status: Former    Types: Cigarettes    Quit date: 2000    Years since quitting: 23.6   Smokeless tobacco: Never   Tobacco comments:    in [employer] Use: Never used  Substance and Sexual Activity   Alcohol use: Not Currently   Drug use: No    Sexual activity: Not on file  Other Topics Concern   Not on file  Social History Narrative   Not on file   Social Determinants of Health   Financial Resource Strain: Not on file  Food Insecurity: Not on file  Transportation Needs: Not on file  Physical Activity: Not on file  Stress: Not on file  Social Connections: Not on file  Intimate Partner Violence: Not on file  :  Pertinent items are noted in HPI.  Exam: Blood pressure (!) 83/61, pulse (!) 50, temperature (!) 97.5 F (36.4 C), temperature source Oral, resp. rate 18, weight 277 lb 3 oz (125.7 kg), SpO2 96 %.  General appearance: alert and  cooperative appeared without distress. Head: atraumatic without any abnormalities. Eyes: conjunctivae/corneas clear. PERRL.  Sclera anicteric. Throat: lips, mucosa, and tongue normal; without oral thrush or ulcers. Resp: clear to auscultation bilaterally without rhonchi, wheezes or dullness to percussion. Cardio: regular rate and rhythm, S1, S2 normal, no murmur, click, rub or gallop GI: soft, non-tender; bowel sounds normal; no masses,  no organomegaly Skin: Skin color, texture, turgor normal. No rashes or lesions Lymph nodes: Cervical, supraclavicular, and axillary nodes normal. Neurologic: Grossly normal without any motor, sensory or deep tendon reflexes. Musculoskeletal: No joint deformity or effusion.    Assessment and Plan:   51 year old man with:  Stage III melanoma diagnosed in 2012.  He initially presented with stage IIa disease in 2009.  He underwent right axillary lymph node dissection with 1 out of 26 lymph node involved.  He received adjuvant radiation therapy and 3 cycles of ipilimumab.  He remained on active surveillance since 2017 without any evidence of disease relapse.  The natural course of this disease was discussed and treatment choices were reviewed.  Imaging studies obtained in April 2023 showed no clear-cut evidence of metastasis.  At this time, I do not  recommend any further work-up or intervention from a medical oncology standpoint.  I do recommend that continue dermatology surveillance at this time.    Given his high risk melanoma and the abnormalities in his CT scan I recommended obtaining a PET scan to rule out or rule out any recurrence.  If his PET scan is negative no further oncology follow-up is needed.  Salvage therapy options for recurrent melanoma including BRAF targeted therapy Is appropriate mutation as well as reviewed treatment with immunotherapy.   2.  Dermatology surveillance: We have discussed strategies to protect his skin and emphasized the importance of protecting his skin from the sun and regular dermatology follow-up.  3.  Lower extremity wounds: Continues to follow with vascular surgery and orthopedics.   4.  Follow-up: To be determined pending the results of his PET scan.  If is negative no further oncology follow-up is needed.  45  minutes were dedicated to this visit. The time was spent on reviewing laboratory data, imaging studies, discussing treatment options, and answering questions regarding future plan.    A copy of this consult has been forwarded to the requesting physician.

## 2022-08-29 ENCOUNTER — Ambulatory Visit (INDEPENDENT_AMBULATORY_CARE_PROVIDER_SITE_OTHER): Payer: PRIVATE HEALTH INSURANCE | Admitting: Family

## 2022-08-29 ENCOUNTER — Encounter: Payer: Self-pay | Admitting: Family

## 2022-08-29 DIAGNOSIS — G4700 Insomnia, unspecified: Secondary | ICD-10-CM | POA: Diagnosis not present

## 2022-08-29 DIAGNOSIS — L97201 Non-pressure chronic ulcer of unspecified calf limited to breakdown of skin: Secondary | ICD-10-CM | POA: Diagnosis not present

## 2022-08-29 DIAGNOSIS — I890 Lymphedema, not elsewhere classified: Secondary | ICD-10-CM | POA: Diagnosis not present

## 2022-08-29 DIAGNOSIS — I83002 Varicose veins of unspecified lower extremity with ulcer of calf: Secondary | ICD-10-CM

## 2022-08-29 MED ORDER — ZOLPIDEM TARTRATE 10 MG PO TABS: 10.0000 mg | ORAL_TABLET | Freq: Every evening | ORAL | 0 refills | Status: DC | PRN

## 2022-08-29 NOTE — Progress Notes (Signed)
Office Visit Note   Patient: Evan [name]           Date of Birth: 03/22/1971           MRN: [id number] Visit Date: 08/29/2022              Requested by: No referring provider defined for this encounter. PCP: Pcp, No  Chief Complaint  Patient presents with   Right Leg - Follow-up      HPI: The patient is a 51 year old gentleman who presents in follow-up for venous stasis ulcer to the right lower extremity had been in serial Unna and Dynaflex compression for the last several weeks.  Pleased with the progress.  He does state that he has between primary care providers has currently been dismissed from his last primary care provider's practice for missed appointments.  Assessment & Plan: Visit Diagnoses: No diagnosis found.  Plan: Pleased with resolution of the ulcer to the right lower extremity.  Today this is covered with eschar.  He will resume his compression garments we will not apply a compression wrap today.  May shower may get this wet.  We will provide a one-time refill of his Ambien.  Follow-Up Instructions: No follow-ups on file.   Ortho Exam  Patient is alert, oriented, no adenopathy, well-dressed, normal affect, normal respiratory effort. On examination of the right lower extremity there is good wrinkling of the skin reduction of edema.  There is no erythema or warmth to the area of ulceration remains 10 mm in diameter covered with eschar there is no open area no drainage no erythema  Imaging: No results found. No images are attached to the encounter.  Labs: Lab Results  Component Value Date   HGBA1C 5.6 04/17/2019   ESRSEDRATE 72 (H) 05/19/2019   ESRSEDRATE 59 (H) 05/05/2019   ESRSEDRATE 75 (H) 04/17/2019   CRP 65.7 (H) 06/23/2019   CRP 61.5 (H) 06/04/2019   CRP 47.8 (H) 05/26/2019   LABURIC 5.6 03/26/2022   REPTSTATUS 04/01/2022 FINAL 03/26/2022   GRAMSTAIN  04/17/2019    RARE WBC PRESENT,BOTH PMN AND MONONUCLEAR NO ORGANISMS SEEN    CULT   03/26/2022    NO GROWTH 5 DAYS Performed at [facility], [place] [address] [place], [place] [address]      Lab Results  Component Value Date   ALBUMIN 3.8 10/17/2020   ALBUMIN 2.4 (L) 05/06/2019   ALBUMIN 2.4 (L) 04/17/2019    No results found for: "MG" Lab Results  Component Value Date   VD25OH 32.0 04/17/2019    No results found for: "PREALBUMIN"    Latest Ref Rng & Units 03/28/2022    4:33 AM 03/27/2022    5:55 AM 03/26/2022    5:40 AM  CBC EXTENDED  WBC 4.0 - 10.5 K/uL 3.7  5.6  10.9   RBC 4.22 - 5.81 MIL/uL 3.87  3.77  4.63   Hemoglobin 13.0 - 17.0 g/dL 10.9  10.4  12.9   HCT 39.0 - 52.0 % 33.7  32.6  41.3   Platelets 150 - 400 K/uL 261  236  328      There is no height or weight on file to calculate BMI.  Orders:  No orders of the defined types were placed in this encounter.  No orders of the defined types were placed in this encounter.    Procedures: No procedures performed  Clinical Data: No additional findings.  ROS:  All other systems negative, except as  noted in the HPI. Review of Systems  Objective: Vital Signs: There were no vitals taken for this visit.  Specialty Comments:  No specialty comments available.  PMFS History: Patient Active Problem List   Diagnosis Date Noted   Venous stasis ulcer of calf limited to breakdown of skin with varicose veins (HCC)    Cellulitis 03/27/2022   AKI (acute kidney injury) ([place]) 03/26/2022   Multiple open wounds of lower leg 03/26/2022   Dyspnea on exertion 03/26/2022   Great toe pain, right 03/26/2022   IV drug abuse ([place])    Septic arthritis of knee, right ([place]) 53/97/6734   Hardware complicating wound infection ([place]) 04/17/2019   Hepatitis C virus infection cured after antiviral drug therapy 04/17/2019   Acute lateral meniscus tear of right knee 01/23/2019   Displaced fracture of right tibial tuberosity, initial encounter for closed fracture 01/16/2019   Closed bicondylar fracture of right  tibial plateau 01/15/2019   Cough 10/31/2016   Depression 06/15/2014   Acute bronchitis 01/28/2014   Chronic pain syndrome 01/28/2014   ADD (attention deficit disorder) 05/11/2013   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   Eczema 12/30/2012   Metastatic melanoma 10/17/2011   Chest pain 10/17/2011   Insomnia 12/20/2008   Anxiety state 12/10/2007   ERECTILE DYSFUNCTION 12/10/2007   Essential hypertension 11/21/2007   Past Medical History:  Diagnosis Date   ADD (attention deficit disorder) 05/11/2013   ANXIETY 12/10/2007   BACK PAIN 08/03/2009   Chronic pain syndrome 01/28/2014   Depression 06/15/2014   ERECTILE DYSFUNCTION 12/10/2007   Hepatitis C 05/15/2013     "treated and cured"   HYPERTENSION 11/21/2007   INSOMNIA-SLEEP DISORDER-UNSPEC 12/20/2008   Melanoma ([place]) 2015   Lymph nodes left side , radiation   Neuropathy    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   Preventative health care 05/14/2011    Family History  Problem Relation Age of Onset   Cancer Father        melanoma on back   Cancer Other        pancreatic    Past Surgical History:  Procedure Laterality Date   EXTERNAL FIXATION LEG Right 01/16/2019   Procedure: EXTERNAL FIXATION RIGHT KNEE;  Surgeon: Shona Needles, MD;  Location: [facility];  Service: Orthopedics;  Laterality: Right;   HARDWARE REMOVAL Right 04/17/2019   Procedure: HARDWARE REMOVAL RIGHT TIBIA;  Surgeon: Shona Needles, MD;  Location: [facility];  Service: Orthopedics;  Laterality: Right;   IRRIGATION AND DEBRIDEMENT KNEE Right 04/17/2019   Procedure: IRRIGATION AND DEBRIDEMENT RIGHT KNEE;  Surgeon: Shona Needles, MD;  Location: [facility];  Service: Orthopedics;  Laterality: Right;   LYMPHADENECTOMY Left    ORIF TIBIA PLATEAU Right 01/20/2019   Procedure: OPEN REDUCTION INTERNAL FIXATION (ORIF) TIBIAL PLATEAU;  Surgeon: Shona Needles, MD;  Location: [facility];  Service: Orthopedics;  Laterality: Right;   SHOULDER SURGERY Left    chronic L  dislocating    Social History   Occupational History   Occupation: works in [employer] Use   Smoking status: Former    Types: Cigarettes    Quit date: 2000    Years since quitting: 23.6   Smokeless tobacco: Never   Tobacco comments:    in college  Vaping Use   Vaping Use: Never used  Substance and Sexual Activity   Alcohol use: Not Currently   Drug use: No   Sexual activity: Not on file

## 2022-09-07 ENCOUNTER — Encounter (HOSPITAL_COMMUNITY): Payer: No Typology Code available for payment source

## 2022-09-07 ENCOUNTER — Encounter (HOSPITAL_COMMUNITY)
Admission: RE | Admit: 2022-09-07 | Discharge: 2022-09-07 | Disposition: A | Discharge status: Home / Self Care | Payer: No Typology Code available for payment source | Source: Ambulatory Visit | Attending: Oncology | Admitting: Oncology

## 2022-09-07 DIAGNOSIS — C439 Malignant melanoma of skin, unspecified: Secondary | ICD-10-CM | POA: Diagnosis present

## 2022-09-07 LAB — GLUCOSE, CAPILLARY: Glucose-Capillary: 110 mg/dL — ABNORMAL HIGH (ref 70–99)

## 2022-09-07 MED ORDER — FLUDEOXYGLUCOSE F - 18 (FDG) INJECTION
13.1800 | Freq: Once | INTRAVENOUS | Status: AC | PRN
  Administered 2022-09-07: 13.18 via INTRAVENOUS

## 2022-09-12 ENCOUNTER — Encounter: Payer: Self-pay | Admitting: Family

## 2022-09-12 ENCOUNTER — Ambulatory Visit (INDEPENDENT_AMBULATORY_CARE_PROVIDER_SITE_OTHER): Payer: PRIVATE HEALTH INSURANCE | Admitting: Family

## 2022-09-12 ENCOUNTER — Other Ambulatory Visit: Payer: Self-pay | Admitting: Oncology

## 2022-09-12 DIAGNOSIS — C439 Malignant melanoma of skin, unspecified: Secondary | ICD-10-CM

## 2022-09-12 DIAGNOSIS — S81801D Unspecified open wound, right lower leg, subsequent encounter: Secondary | ICD-10-CM

## 2022-09-12 DIAGNOSIS — S91302A Unspecified open wound, left foot, initial encounter: Secondary | ICD-10-CM | POA: Diagnosis not present

## 2022-09-12 MED ORDER — PENICILLIN V POTASSIUM 500 MG PO TABS: 500.0000 mg | ORAL_TABLET | Freq: Four times a day (QID) | ORAL | 0 refills | Status: DC

## 2022-09-12 NOTE — Progress Notes (Signed)
Office Visit Note   Patient: Evan [name]           Date of Birth: 09-24-1971           MRN: [id number] Visit Date: 09/12/2022              Requested by: No referring provider defined for this encounter. PCP: Pcp, No  No chief complaint on file.     HPI: The patient is a 51 year old gentleman who is seen today in follow-up we have been following him for an ulcer to his right lower extremity.  This was treated with sterile compression wraps the area has scabbed over but has not fully healed  He states that has been having scant bloody drainage.  Reports new ulcer to the dorsum of his left foot this is also having some bloody drainage denies injury associated with this wound  He is also complaining today of some swelling to the right side of his face and cheek he states that the cheek is quite painful he has some tenderness some pain inside his mouth he reports he does have 1 broken tooth this is been ongoing for 1 day denies fever or chills  Assessment & Plan: Visit Diagnoses: No diagnosis found.  Plan: Concern for recurrence of his melanoma.  Concerned about new wound as well as his chronic ulcer to the right leg.  Would like him to get back in with dermatology for biopsy.  Given an order for Pen-Vee K discussed the importance of seeking dental input.  Recommended he follow-up with a dentist or the emergency department.  If there should be any worsening or he fails to improve in the next 24 hours he is to call or present to the ED.   Follow-Up Instructions: No follow-ups on file.   Ortho Exam  Patient is alert, oriented, no adenopathy, well-dressed, normal affect, normal respiratory effort. On examination of the face he does have some moderate edema of the right cheek there is no involvement of the orbit.  Broken upper molar seen on the right there is no visible abscess no drainage no foul odor in his mouth  On examination of the right lower extremity there is trace  edema there is no pitting he does have stable ulceration over the anterior shin this is 2 cm in diameter covered with eschar and dried blood.  There is no surrounding erythema no active drainage. To the left foot there is a similar ulceration this is about 2-1/2 cm in diameter he states this is new came on over the last 1 week there is 1 drop of bloody drainage there is surrounding dried blood    Imaging: No results found. No images are attached to the encounter.  Labs: Lab Results  Component Value Date   HGBA1C 5.6 04/17/2019   ESRSEDRATE 72 (H) 05/19/2019   ESRSEDRATE 59 (H) 05/05/2019   ESRSEDRATE 75 (H) 04/17/2019   CRP 65.7 (H) 06/23/2019   CRP 61.5 (H) 06/04/2019   CRP 47.8 (H) 05/26/2019   LABURIC 5.6 03/26/2022   REPTSTATUS 04/01/2022 FINAL 03/26/2022   GRAMSTAIN  04/17/2019    RARE WBC PRESENT,BOTH PMN AND MONONUCLEAR NO ORGANISMS SEEN    CULT  03/26/2022    NO GROWTH 5 DAYS Performed at [facility], [place] [address] [place], [place] [address]      Lab Results  Component Value Date   ALBUMIN 3.8 10/17/2020   ALBUMIN 2.4 (L) 05/06/2019   ALBUMIN 2.4 (L)  04/17/2019    No results found for: "MG" Lab Results  Component Value Date   VD25OH 32.0 04/17/2019    No results found for: "PREALBUMIN"    Latest Ref Rng & Units 03/28/2022    4:33 AM 03/27/2022    5:55 AM 03/26/2022    5:40 AM  CBC EXTENDED  WBC 4.0 - 10.5 K/uL 3.7  5.6  10.9   RBC 4.22 - 5.81 MIL/uL 3.87  3.77  4.63   Hemoglobin 13.0 - 17.0 g/dL 10.9  10.4  12.9   HCT 39.0 - 52.0 % 33.7  32.6  41.3   Platelets 150 - 400 K/uL 261  236  328      There is no height or weight on file to calculate BMI.  Orders:  No orders of the defined types were placed in this encounter.  No orders of the defined types were placed in this encounter.    Procedures: No procedures performed  Clinical Data: No additional findings.  ROS:  All other systems negative, except as noted in the HPI. Review of  Systems  Constitutional:  Negative for chills and fever.  Cardiovascular:  Positive for leg swelling.  Skin:  Positive for wound.    Objective: Vital Signs: There were no vitals taken for this visit.  Specialty Comments:  No specialty comments available.  PMFS History: Patient Active Problem List   Diagnosis Date Noted   Venous stasis ulcer of calf limited to breakdown of skin with varicose veins (HCC)    Cellulitis 03/27/2022   AKI (acute kidney injury) ([place]) 03/26/2022   Multiple open wounds of lower leg 03/26/2022   Dyspnea on exertion 03/26/2022   Great toe pain, right 03/26/2022   IV drug abuse ([place])    Septic arthritis of knee, right ([place]) 54/00/8676   Hardware complicating wound infection ([place]) 04/17/2019   Hepatitis C virus infection cured after antiviral drug therapy 04/17/2019   Acute lateral meniscus tear of right knee 01/23/2019   Displaced fracture of right tibial tuberosity, initial encounter for closed fracture 01/16/2019   Closed bicondylar fracture of right tibial plateau 01/15/2019   Cough 10/31/2016   Depression 06/15/2014   Acute bronchitis 01/28/2014   Chronic pain syndrome 01/28/2014   ADD (attention deficit disorder) 05/11/2013   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   Eczema 12/30/2012   Metastatic melanoma 10/17/2011   Chest pain 10/17/2011   Insomnia 12/20/2008   Anxiety state 12/10/2007   ERECTILE DYSFUNCTION 12/10/2007   Essential hypertension 11/21/2007   Past Medical History:  Diagnosis Date   ADD (attention deficit disorder) 05/11/2013   ANXIETY 12/10/2007   BACK PAIN 08/03/2009   Chronic pain syndrome 01/28/2014   Depression 06/15/2014   ERECTILE DYSFUNCTION 12/10/2007   Hepatitis C 05/15/2013     "treated and cured"   HYPERTENSION 11/21/2007   INSOMNIA-SLEEP DISORDER-UNSPEC 12/20/2008   Melanoma ([place]) 2015   Lymph nodes left side , radiation   Neuropathy    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   Preventative  health care 05/14/2011    Family History  Problem Relation Age of Onset   Cancer Father        melanoma on back   Cancer Other        pancreatic    Past Surgical History:  Procedure Laterality Date   EXTERNAL FIXATION LEG Right 01/16/2019   Procedure: EXTERNAL FIXATION RIGHT KNEE;  Surgeon: Shona Needles, MD;  Location: [facility];  Service: Orthopedics;  Laterality: Right;  HARDWARE REMOVAL Right 04/17/2019   Procedure: HARDWARE REMOVAL RIGHT TIBIA;  Surgeon: Shona Needles, MD;  Location: [facility];  Service: Orthopedics;  Laterality: Right;   IRRIGATION AND DEBRIDEMENT KNEE Right 04/17/2019   Procedure: IRRIGATION AND DEBRIDEMENT RIGHT KNEE;  Surgeon: Shona Needles, MD;  Location: [facility];  Service: Orthopedics;  Laterality: Right;   LYMPHADENECTOMY Left    ORIF TIBIA PLATEAU Right 01/20/2019   Procedure: OPEN REDUCTION INTERNAL FIXATION (ORIF) TIBIAL PLATEAU;  Surgeon: Shona Needles, MD;  Location: [facility];  Service: Orthopedics;  Laterality: Right;   SHOULDER SURGERY Left    chronic L dislocating    Social History   Occupational History   Occupation: works in [place] Use   Smoking status: Former    Types: Cigarettes    Quit date: 2000    Years since quitting: 23.7   Smokeless tobacco: Never   Tobacco comments:    in college  Vaping Use   Vaping Use: Never used  Substance and Sexual Activity   Alcohol use: Not Currently   Drug use: No   Sexual activity: Not on file

## 2022-09-12 NOTE — Progress Notes (Signed)
The results of the PET scan were personally reviewed and discussed with the patient.  These findings are equivocal for cancer recurrence and he is lymph nodes are borderline in size and likely reactive in nature.  Metastatic melanoma cannot completely be ruled out however.  I have recommended repeat imaging studies in the next 3 to 4 months and will determine best course of action.  If these lymph nodes continue to grow then a biopsy would be recommended.  If they remain stable or regressed then no further imaging will be required.

## 2022-10-10 ENCOUNTER — Ambulatory Visit: Payer: No Typology Code available for payment source | Admitting: Family

## 2022-10-18 ENCOUNTER — Encounter: Payer: Self-pay | Admitting: Nurse Practitioner

## 2022-10-18 ENCOUNTER — Ambulatory Visit: Payer: PRIVATE HEALTH INSURANCE | Admitting: Nurse Practitioner

## 2022-10-18 VITALS — BP 182/98 | HR 123 | Temp 98.4°F | Ht 75.0 in | Wt 288.1 lb

## 2022-10-18 DIAGNOSIS — F410 Panic disorder [episodic paroxysmal anxiety] without agoraphobia: Secondary | ICD-10-CM | POA: Diagnosis not present

## 2022-10-18 DIAGNOSIS — Z6836 Body mass index (BMI) 36.0-36.9, adult: Secondary | ICD-10-CM | POA: Diagnosis not present

## 2022-10-18 DIAGNOSIS — G629 Polyneuropathy, unspecified: Secondary | ICD-10-CM

## 2022-10-18 DIAGNOSIS — G4700 Insomnia, unspecified: Secondary | ICD-10-CM | POA: Diagnosis not present

## 2022-10-18 DIAGNOSIS — E669 Obesity, unspecified: Secondary | ICD-10-CM | POA: Diagnosis not present

## 2022-10-18 DIAGNOSIS — I10 Essential (primary) hypertension: Secondary | ICD-10-CM | POA: Diagnosis not present

## 2022-10-18 LAB — COMPREHENSIVE METABOLIC PANEL
ALT: 9 U/L (ref 0–53)
AST: 16 U/L (ref 0–37)
Albumin: 4.4 g/dL (ref 3.5–5.2)
Alkaline Phosphatase: 92 U/L (ref 39–117)
BUN: 25 mg/dL — ABNORMAL HIGH (ref 6–23)
CO2: 26 mEq/L (ref 19–32)
Calcium: 9.9 mg/dL (ref 8.4–10.5)
Chloride: 101 mEq/L (ref 96–112)
Creatinine, Ser: 1.41 mg/dL (ref 0.40–1.50)
GFR: 58.69 mL/min — ABNORMAL LOW (ref >60.00)
Glucose, Bld: 87 mg/dL (ref 70–99)
Potassium: 4.5 mEq/L (ref 3.5–5.1)
Sodium: 137 mEq/L (ref 135–145)
Total Bilirubin: 0.2 mg/dL (ref 0.2–1.2)
Total Protein: 7.9 g/dL (ref 6.0–8.3)

## 2022-10-18 LAB — CBC
HCT: 41.7 % (ref 39.0–52.0)
Hemoglobin: 13.6 g/dL (ref 13.0–17.0)
MCHC: 32.7 g/dL (ref 30.0–36.0)
MCV: 85.2 fl (ref 78.0–100.0)
Platelets: 345 10*3/uL (ref 150.0–400.0)
RBC: 4.89 Mil/uL (ref 4.22–5.81)
RDW: 14.3 % (ref 11.5–15.5)
WBC: 9 10*3/uL (ref 4.0–10.5)

## 2022-10-18 LAB — LIPID PANEL
Cholesterol: 198 mg/dL (ref 0–200)
HDL: 37.5 mg/dL — ABNORMAL LOW (ref >39.00)
NonHDL: 160.58
Total CHOL/HDL Ratio: 5
Triglycerides: 274 mg/dL — ABNORMAL HIGH (ref 0.0–149.0)
VLDL: 54.8 mg/dL — ABNORMAL HIGH (ref 0.0–40.0)

## 2022-10-18 LAB — TSH: TSH: 5.28 u[IU]/mL (ref 0.35–5.50)

## 2022-10-18 LAB — HEMOGLOBIN A1C: Hgb A1c MFr Bld: 5.5 % (ref 4.6–6.5)

## 2022-10-18 LAB — LDL CHOLESTEROL, DIRECT: Direct LDL: 142 mg/dL

## 2022-10-18 MED ORDER — LOSARTAN POTASSIUM 50 MG PO TABS: 50.0000 mg | ORAL_TABLET | Freq: Every day | ORAL | 1 refills | Status: DC

## 2022-10-18 MED ORDER — ALPRAZOLAM 1 MG PO TABS: 0.5000 mg | ORAL_TABLET | Freq: Three times a day (TID) | ORAL | 0 refills | Status: DC | PRN

## 2022-10-18 MED ORDER — ATENOLOL 25 MG PO TABS: 25.0000 mg | ORAL_TABLET | Freq: Every day | ORAL | 1 refills | Status: DC

## 2022-10-18 MED ORDER — ZOLPIDEM TARTRATE 5 MG PO TABS: 5.0000 mg | ORAL_TABLET | Freq: Every evening | ORAL | 0 refills | Status: DC | PRN

## 2022-10-18 MED ORDER — GABAPENTIN 100 MG PO CAPS: 100.0000 mg | ORAL_CAPSULE | Freq: Two times a day (BID) | ORAL | 2 refills | Status: DC | PRN

## 2022-10-18 NOTE — Progress Notes (Signed)
New Patient Office Visit  Subjective    Patient ID: Evan [name], male    DOB: 20-Oct-1971  Age: 51 y.o. MRN: [id number]  CC:  Chief Complaint  Patient presents with   Hypertension    HPI Tellis Spivak presents to establish care He was a patient of Dr. Jenny Reichmann in this office a few years back, but was dismissed due to canceling/no-show appointments.  He is back today to reestablish care, he would prefer to see Dr. Jenny Reichmann if possible.  His main concerns are as addressed below.  Hypertension: Used to be on atenolol and losartan, reports has been out of his chronic antihypertensives once he ran out of refills provided to him from hospital discharge approximately 6 months ago.  Anxiety/panic disorder/Insomnia: Reports having been on Xanax 3 times a day, requesting refill today.  Also takes Ambien at bedtime.  Used to follow with Dr. Robina Ade, not seeing psychiatry at this time. Per chart review patient had positive UDS in hospital for opiates and amphetamines, I do not see active prescription for either medications.   Obesity: Notes he has gained weight and is hoping to lose weight.  Neuropathy: Has been treated for stage IIIc melanoma, following this treatment he has been left with chronic neuropathy of his feet.  He also has history of venous stasis.  Outpatient Encounter Medications as of 10/18/2022  Medication Sig   acetaminophen (TYLENOL) 500 MG tablet Take 1 tablet (500 mg total) by mouth every 12 (twelve) hours.   gabapentin (NEURONTIN) 100 MG capsule Take 1 capsule (100 mg total) by mouth 2 (two) times daily as needed (pain).   ibuprofen (ADVIL) 200 MG tablet Take 800-1,000 mg by mouth every 4 (four) hours as needed for headache or moderate pain.   pantoprazole (PROTONIX) 40 MG tablet Take 1 tablet (40 mg total) by mouth daily.   [DISCONTINUED] ALPRAZolam (XANAX) 0.5 MG tablet Take 1 tablet (0.5 mg total) by mouth 3 (three) times daily as needed for anxiety.   [DISCONTINUED]  atenolol (TENORMIN) 25 MG tablet Take 1 tablet (25 mg total) by mouth daily.   [DISCONTINUED] atenolol (TENORMIN) 25 MG tablet Take 1 tablet by mouth daily.   [DISCONTINUED] losartan (COZAAR) 50 MG tablet Take 1 tablet (50 mg total) by mouth daily.   [DISCONTINUED] oxyCODONE (OXY IR/ROXICODONE) 5 MG immediate release tablet Take 10 mg by mouth every 4 (four) hours as needed for severe pain.   [DISCONTINUED] penicillin v potassium (VEETID) 500 MG tablet Take 1 tablet (500 mg total) by mouth 4 (four) times daily.   [DISCONTINUED] zolpidem (AMBIEN) 10 MG tablet Take 1 tablet (10 mg total) by mouth at bedtime as needed for sleep.   atenolol (TENORMIN) 25 MG tablet Take 1 tablet (25 mg total) by mouth daily.   losartan (COZAAR) 50 MG tablet Take 1 tablet (50 mg total) by mouth daily.   zolpidem (AMBIEN) 5 MG tablet Take 1 tablet (5 mg total) by mouth at bedtime as needed for sleep.   [DISCONTINUED] ALPRAZolam (XANAX) 1 MG tablet Take 0.5-1 tablets (0.5-1 mg total) by mouth 3 (three) times daily as needed for anxiety.   No facility-administered encounter medications on file as of 10/18/2022.    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   Preventative health care 05/14/2011    Past Surgical History:  Procedure Laterality Date   EXTERNAL FIXATION LEG Right 01/16/2019   Procedure: EXTERNAL FIXATION RIGHT KNEE;  Surgeon: Shona Needles, MD;  Location: [facility];  Service: Orthopedics;  Laterality: Right;   HARDWARE REMOVAL Right 04/17/2019   Procedure: HARDWARE REMOVAL RIGHT TIBIA;   Surgeon: Shona Needles, MD;  Location: [facility];  Service: Orthopedics;  Laterality: Right;   IRRIGATION AND DEBRIDEMENT KNEE Right 04/17/2019   Procedure: IRRIGATION AND DEBRIDEMENT RIGHT KNEE;  Surgeon: Shona Needles, MD;  Location: [facility];  Service: Orthopedics;  Laterality: Right;   LYMPHADENECTOMY Left    ORIF TIBIA PLATEAU Right 01/20/2019   Procedure: OPEN REDUCTION INTERNAL FIXATION (ORIF) TIBIAL PLATEAU;  Surgeon: Shona Needles, MD;  Location: [facility];  Service: Orthopedics;  Laterality: Right;   SHOULDER SURGERY Left    chronic L dislocating     Family History  Problem Relation Age of Onset   Cancer Father        melanoma on back   Cancer Other        pancreatic    Social History   Socioeconomic History   Marital status: Divorced    Spouse name: Not on file   Number of children: Not on file   Years of education: Not on file   Highest education level: Not on file  Occupational History   Occupation: works in [employer] Use   Smoking status: Former    Types: Cigarettes    Quit date: 2000    Years since quitting: 23.8   Smokeless tobacco: Never   Tobacco comments:    in [employer] Use: Never used  Substance and Sexual Activity   Alcohol use: Not Currently   Drug use: No   Sexual activity: Not on file  Other Topics Concern   Not on file  Social History Narrative   Not on file   Social Determinants of Health   Financial Resource Strain: Not on file  Food Insecurity: Not on file  Transportation Needs: Not on file  Physical Activity: Not on file  Stress: Not on file  Social Connections: Not on file  Intimate Partner Violence: Not on file    Review of Systems  Eyes:  Negative for blurred vision.  Cardiovascular:  Negative for chest pain.  Neurological:  Negative for weakness and headaches.        Objective    BP (!) 182/98   Pulse (!) 123   Temp 98.4 F (36.9 C) (Oral)   Ht '6\' 3"'$  (1.905 m)   Wt 288 lb 2 oz (130.7 kg)    SpO2 96%   BMI 36.01 kg/m   Physical Exam Vitals reviewed.  Constitutional:      Appearance: Normal appearance.  HENT:     Head: Normocephalic and atraumatic.  Cardiovascular:     Rate and Rhythm: Normal rate and regular rhythm.  Pulmonary:     Effort: Pulmonary effort is normal.     Breath sounds: Normal breath sounds.  Musculoskeletal:     Cervical back: Neck supple.  Feet:     Comments: Hyperpigmented to bilateral lower extremities. Has wound to right lower extremity that is in the process of healing,  it is scabbed over with no visible signs of drainage Skin:    General: Skin is warm and dry.  Neurological:     Mental Status: He is alert and oriented to person, place, and time.  Psychiatric:        Mood and Affect: Mood normal.        Behavior: Behavior normal.        Thought Content: Thought content normal.        Judgment: Judgment normal.         Assessment & Plan:   Problem List Items Addressed This Visit       Cardiovascular and Mediastinum   Essential hypertension - Primary    Chronic, uncontrolled today.  Patient has been out of medication for quite some time.  Restart atenolol 25 mg daily and losartan 50 mg daily.  Check metabolic panel today.      Relevant Medications   atenolol (TENORMIN) 25 MG tablet   losartan (COZAAR) 50 MG tablet   Other Relevant Orders   TSH   Hemoglobin A1c   Lipid panel   Comprehensive metabolic panel   CBC     Nervous and Auditory   Neuropathy    Chronic, possibly secondary to previous cancer treatment.  I think it is reasonable to trial gabapentin.  Prescribed 100 mg capsule twice a day as needed.  Patient educated not to stop medication abruptly if he takes it daily.  Patient reports understanding.      Relevant Medications   gabapentin (NEURONTIN) 100 MG capsule     Other   Insomnia    Chronic, will order Ambien 5 mg tablets that he can take at bedtime as needed.  Will not plan on refilling any further, patient  may consider establishing with psychiatry.      Relevant Medications   zolpidem (AMBIEN) 5 MG tablet   Other Relevant Orders   TSH   Hemoglobin A1c   Lipid panel   Comprehensive metabolic panel   CBC   Panic disorder    Chronic, due to patient's recent positive urine drug screen would not feel comfortable restarting him on Xanax.  We will recommend he establish with psychiatry for assistance with managing his panic disorder.      Relevant Orders   TSH   Hemoglobin A1c   Lipid panel   Comprehensive metabolic panel   CBC   Class 2 obesity with body mass index (BMI) of 36.0 to 36.9 in adult    Chronic, will order labs for further evaluation.  Further recommendations to be made based upon these results.      Relevant Orders   TSH   Hemoglobin A1c   Lipid panel   Comprehensive metabolic panel   CBC    Return in about 1 month (around 11/18/2022) for Follow-up with Kaimen Peine.   Ailene Ards, NP

## 2022-10-18 NOTE — Assessment & Plan Note (Signed)
Chronic, uncontrolled today.  Patient has been out of medication for quite some time.  Restart atenolol 25 mg daily and losartan 50 mg daily.  Check metabolic panel today.

## 2022-10-18 NOTE — Assessment & Plan Note (Signed)
Chronic, will order Ambien 5 mg tablets that he can take at bedtime as needed.  Will not plan on refilling any further, patient may consider establishing with psychiatry.

## 2022-10-18 NOTE — Assessment & Plan Note (Signed)
Chronic, possibly secondary to previous cancer treatment.  I think it is reasonable to trial gabapentin.  Prescribed 100 mg capsule twice a day as needed.  Patient educated not to stop medication abruptly if he takes it daily.  Patient reports understanding.

## 2022-10-18 NOTE — Assessment & Plan Note (Signed)
Chronic, will order labs for further evaluation.  Further recommendations to be made based upon these results.

## 2022-10-18 NOTE — Assessment & Plan Note (Signed)
Chronic, due to patient's recent positive urine drug screen would not feel comfortable restarting him on Xanax.  We will recommend he establish with psychiatry for assistance with managing his panic disorder.

## 2022-10-19 ENCOUNTER — Telehealth: Payer: Self-pay | Admitting: Nurse Practitioner

## 2022-10-19 DIAGNOSIS — I10 Essential (primary) hypertension: Secondary | ICD-10-CM

## 2022-10-19 DIAGNOSIS — F410 Panic disorder [episodic paroxysmal anxiety] without agoraphobia: Secondary | ICD-10-CM

## 2022-10-19 DIAGNOSIS — G4700 Insomnia, unspecified: Secondary | ICD-10-CM

## 2022-10-19 NOTE — Telephone Encounter (Signed)
Please call patient and let him know that labs show mildly reduced kidney function, normal electrolytes, high cholesterol, no anemia, no diabetes, normal thyroid screen. Based on his labs and that we are restarting his losartan that he will need to return to office in 7-10 days to recheck his kidney function and electrolytes. I will place order and he can just come to lab as a walkin during business hours to get labs drawn. Lab does stop drawing blood at 4:30pm so he needs to come in before that. Additionally, after reviewing his chart I see that he had a urine drug screen that was positive for amphetamines and opiates when he was hospitalized. I do not see where he had prescriptions for these medications at that time. Thus, I will not be able to order any controlled substances for him such as xanax and ambien. He will need to establish with psychiatry to have these prescribed to him if he needs them. I will order referral today. Please let me know if he has any questions.

## 2022-10-22 NOTE — Telephone Encounter (Signed)
Called pt to leave voice mail, pt voice mail is full and cannot receive voice mail at this time

## 2022-10-24 NOTE — Telephone Encounter (Signed)
Called pt and voicemail is full

## 2022-10-26 NOTE — Telephone Encounter (Signed)
Talked to pt about lab results and also based on provider's note about him testing positive for amphetamines and opiates when he was hospitalized which was not part of medication he was prescribed, and that he need to come in with the next seven day's for follow up. Pt refused and said he already have an appointment on the 11/22/22 will not come in earlier than that. Let pt know that starting from today going the xanax and [place] and he will need to establish with psychiatry to have these prescribed to him. Pt was upset about this and wanted to speak with provider, let pt know provider will that if he set up the appointment within the seven day's that I said. Let provider know about and provider stated she will speak to him as the pt said in his upcoming appointment if he is not willing to come in earlier within the seven days to talk.

## 2022-11-07 ENCOUNTER — Ambulatory Visit: Payer: PRIVATE HEALTH INSURANCE | Admitting: Family

## 2022-11-21 ENCOUNTER — Ambulatory Visit (INDEPENDENT_AMBULATORY_CARE_PROVIDER_SITE_OTHER): Payer: PRIVATE HEALTH INSURANCE | Admitting: Family

## 2022-11-21 DIAGNOSIS — C439 Malignant melanoma of skin, unspecified: Secondary | ICD-10-CM | POA: Diagnosis not present

## 2022-11-21 DIAGNOSIS — S81801D Unspecified open wound, right lower leg, subsequent encounter: Secondary | ICD-10-CM | POA: Diagnosis not present

## 2022-11-21 DIAGNOSIS — G4700 Insomnia, unspecified: Secondary | ICD-10-CM

## 2022-11-22 ENCOUNTER — Encounter: Payer: Self-pay | Admitting: Nurse Practitioner

## 2022-11-22 ENCOUNTER — Ambulatory Visit: Payer: PRIVATE HEALTH INSURANCE | Admitting: Nurse Practitioner

## 2022-11-22 NOTE — Telephone Encounter (Signed)
This encounter was created in error - please disregard.

## 2022-11-23 NOTE — Progress Notes (Signed)
Office Visit Note   Patient: Evan [name]           Date of Birth: Jun 05, 1971           MRN: [id number] Visit Date: 11/21/2022              Requested by: Ailene Ards, NP [address] [place],  [place] [id number] PCP: Ailene Ards, NP  Chief Complaint  Patient presents with  . Right Leg - Follow-up      HPI: The patient is a 51 year old gentleman who presents today in follow up for continued issues with scabbed ulceration to the right lower leg.  This is chronic been going on for many months.  At last visit was referred back to dermatology.  The patient reports he has not been able to make or keep that appointment.  Assessment & Plan: Visit Diagnoses: No diagnosis found.  Plan: ***  Follow-Up Instructions: No follow-ups on file.   Ortho Exam  Patient is alert, oriented, no adenopathy, well-dressed, normal affect, normal respiratory effort. ***  Imaging: No results found.    Labs: Lab Results  Component Value Date   HGBA1C 5.5 10/18/2022   HGBA1C 5.6 04/17/2019   ESRSEDRATE 72 (H) 05/19/2019   ESRSEDRATE 59 (H) 05/05/2019   ESRSEDRATE 75 (H) 04/17/2019   CRP 65.7 (H) 06/23/2019   CRP 61.5 (H) 06/04/2019   CRP 47.8 (H) 05/26/2019   LABURIC 5.6 03/26/2022   REPTSTATUS 04/01/2022 FINAL 03/26/2022   GRAMSTAIN  04/17/2019    RARE WBC PRESENT,BOTH PMN AND MONONUCLEAR NO ORGANISMS SEEN    CULT  03/26/2022    NO GROWTH 5 DAYS Performed at [facility], [place] [address], [place], [place] [address]      Lab Results  Component Value Date   ALBUMIN 4.4 10/18/2022   ALBUMIN 3.8 10/17/2020   ALBUMIN 2.4 (L) 05/06/2019    No results found for: "MG" Lab Results  Component Value Date   VD25OH 32.0 04/17/2019    No results found for: "PREALBUMIN"    Latest Ref Rng & Units 10/18/2022    1:47 PM 03/28/2022    4:33 AM 03/27/2022    5:55 AM  CBC EXTENDED  WBC 4.0 - 10.5 K/uL 9.0  3.7  5.6   RBC 4.22 - 5.81 Mil/uL 4.89  3.87  3.77   Hemoglobin  13.0 - 17.0 g/dL 13.6  10.9  10.4   HCT 39.0 - 52.0 % 41.7  33.7  32.6   Platelets 150.0 - 400.0 K/uL 345.0  261  236      There is no height or weight on file to calculate BMI.  Orders:  No orders of the defined types were placed in this encounter.  No orders of the defined types were placed in this encounter.    Procedures: No procedures performed  Clinical Data: No additional findings.  ROS:  All other systems negative, except as noted in the HPI. Review of Systems  Objective: Vital Signs: There were no vitals taken for this visit.  Specialty Comments:  No specialty comments available.  PMFS History: Patient Active Problem List   Diagnosis Date Noted  . Panic disorder 10/18/2022  . Class 2 obesity with body mass index (BMI) of 36.0 to 36.9 in adult 10/18/2022  . Neuropathy 10/18/2022  . Venous stasis ulcer of calf limited to breakdown of skin with varicose veins ([place])   . Cellulitis 03/27/2022  . AKI (acute kidney injury) ([place]) 03/26/2022  .  Multiple open wounds of lower leg 03/26/2022  . Dyspnea on exertion 03/26/2022  . Great toe pain, right 03/26/2022  . IV drug abuse ([facility])   . Septic arthritis of knee, right ([facility]) 04/17/2019  . Hardware complicating wound infection ([place]) 04/17/2019  . Hepatitis C virus infection cured after antiviral drug therapy 04/17/2019  . Acute lateral meniscus tear of right knee 01/23/2019  . Displaced fracture of right tibial tuberosity, initial encounter for closed fracture 01/16/2019  . Closed bicondylar fracture of right tibial plateau 01/15/2019  . Cough 10/31/2016  . Alcohol abuse with intoxication, with delirium ([place]) 10/03/2016  . Attention deficit disorder (ADD) in adult 10/03/2016  . Depression 06/15/2014  . Acute bronchitis 01/28/2014  . Chronic pain syndrome 01/28/2014  . Hepatitis, chronic persistent ([place]) 05/14/2013  . ADD (attention deficit disorder) 05/11/2013  . 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 Eczema 12/30/2012  . Disorder of joints of multiple sites 10/02/2012  . Acute and subacute liver necrosis 04/24/2012  . Metastatic melanoma 10/17/2011  . Chest pain 10/17/2011  . Insomnia 12/20/2008  . Anxiety state 12/10/2007  . ERECTILE DYSFUNCTION 12/10/2007  . Essential hypertension 11/21/2007   Past Medical History:  Diagnosis Date  . ADD (attention deficit disorder) 05/11/2013  . ANXIETY 12/10/2007  . BACK PAIN 08/03/2009  . Chronic pain syndrome 01/28/2014  . Depression 06/15/2014  . ERECTILE DYSFUNCTION 12/10/2007  . Hepatitis C 05/15/2013     "treated and cured"  . HYPERTENSION 11/21/2007  . INSOMNIA-SLEEP DISORDER-UNSPEC 12/20/2008  . Melanoma ([place]) 2015   Lymph nodes left side , radiation  . Neuropathy   . 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 Preventative health care 05/14/2011    Family History  Problem Relation Age of Onset  . Cancer Father        melanoma on back  . Cancer Other        pancreatic    Past Surgical History:  Procedure Laterality Date  . EXTERNAL FIXATION LEG Right 01/16/2019   Procedure: EXTERNAL FIXATION RIGHT KNEE;  Surgeon: Shona Needles, MD;  Location: [facility];  Service: Orthopedics;  Laterality: Right;  . HARDWARE REMOVAL Right 04/17/2019   Procedure: HARDWARE REMOVAL RIGHT TIBIA;  Surgeon: Shona Needles, MD;  Location: [facility];  Service: Orthopedics;  Laterality: Right;  . IRRIGATION AND DEBRIDEMENT KNEE Right 04/17/2019   Procedure: IRRIGATION AND DEBRIDEMENT RIGHT KNEE;  Surgeon: Shona Needles, MD;  Location: [facility];  Service: Orthopedics;  Laterality: Right;  . LYMPHADENECTOMY Left   . ORIF TIBIA PLATEAU Right 01/20/2019   Procedure: OPEN REDUCTION INTERNAL FIXATION (ORIF) TIBIAL PLATEAU;  Surgeon: Shona Needles, MD;  Location: [facility];  Service: Orthopedics;  Laterality: Right;  . SHOULDER SURGERY Left    chronic L dislocating    Social History   Occupational History  . Occupation: works in [employer]  .  Smoking status: Former    Types: Cigarettes    Quit date: 2000    Years since quitting: 23.9  . Smokeless tobacco: Never  . Tobacco comments:    in college  Vaping Use  . Vaping Use: Never used  Substance and Sexual Activity  . Alcohol use: Not Currently  . Drug use: No  . Sexual activity: Not on file

## 2022-11-30 ENCOUNTER — Other Ambulatory Visit: Payer: Self-pay | Admitting: Nurse Practitioner

## 2022-11-30 DIAGNOSIS — F410 Panic disorder [episodic paroxysmal anxiety] without agoraphobia: Secondary | ICD-10-CM

## 2022-12-05 ENCOUNTER — Encounter: Payer: Self-pay | Admitting: Family

## 2022-12-05 ENCOUNTER — Telehealth: Payer: Self-pay | Admitting: Family

## 2022-12-05 NOTE — Telephone Encounter (Signed)
No PT or Ambien - per PCP notes needs to see psych

## 2022-12-05 NOTE — Telephone Encounter (Signed)
Please see message below. I believe we did discuss with the pt at his last visit that we would NOT refill his Ambien. Also please see the rest of the message below. Did you order or want to order PT for this pt?

## 2022-12-05 NOTE — Telephone Encounter (Signed)
Patient stated Junie Panning said she would refill Zolpidem for her at [facility] during last visit, Patient also stated she was supposed to be getting a call for PT there is no referral in there just yet just the one for [facility] (she will call they have not called her back yet) to look at her leg she was unsure if she needed to get her leg looked at first then do PT or how should that go she was looking to do PT at our office please advise

## 2022-12-06 NOTE — Telephone Encounter (Signed)
I called and lm on vm to advise no PT referral and no Ambien and laso to make sure he follows up with dermatology.

## 2022-12-06 NOTE — Telephone Encounter (Signed)
Pt called back and has several questions. Can we call him back

## 2022-12-07 NOTE — Telephone Encounter (Signed)
I called pt and lm on vm to call back with specific questions.

## 2022-12-21 ENCOUNTER — Telehealth: Payer: Self-pay | Admitting: Family

## 2022-12-21 NOTE — Telephone Encounter (Signed)
I called pt and offered appt to come in today and he declined. I asked if he made the appt with [facility] and he states that they do not accept his insurance. I advised that I would speak with referrals to see if we could make another referral to another office. Offered next available appt and pt states that he will just wait and "see how he does"    Can you please look at this prior referral for derm please and see what office participates with his insurance. Junie Panning was concerned for melanoma which he has had in the past and wanted this to be urgently evaluated? Can you let me know what you find out please? Thanks!

## 2022-12-21 NOTE — Telephone Encounter (Signed)
Found list on the [facility] site. Will look into it next week, when back in the office.

## 2022-12-21 NOTE — Telephone Encounter (Signed)
Patient called in stating his leg is not in good shape and he would like Erin to look at it ASAP stated he cannot wait until her next available 01/12 please advise

## 2023-01-02 ENCOUNTER — Telehealth: Payer: Self-pay | Admitting: *Deleted

## 2023-01-02 ENCOUNTER — Other Ambulatory Visit: Payer: Self-pay

## 2023-01-02 ENCOUNTER — Inpatient Hospital Stay: Payer: No Typology Code available for payment source | Attending: Oncology | Admitting: Oncology

## 2023-01-02 VITALS — BP 137/37 | HR 75 | Temp 97.7°F | Resp 20 | Wt 292.4 lb

## 2023-01-02 DIAGNOSIS — C439 Malignant melanoma of skin, unspecified: Secondary | ICD-10-CM | POA: Diagnosis not present

## 2023-01-02 DIAGNOSIS — Z85820 Personal history of malignant melanoma of skin: Secondary | ICD-10-CM | POA: Diagnosis present

## 2023-01-02 DIAGNOSIS — Z08 Encounter for follow-up examination after completed treatment for malignant neoplasm: Secondary | ICD-10-CM | POA: Insufficient documentation

## 2023-01-02 NOTE — Progress Notes (Signed)
Hematology and Oncology Follow Up Visit  Evan [name] [id number] 1971/07/04 52 y.o. 01/02/2023 3:17 PM Evan [name], NPNo ref. 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  He developed stage III in 2012 with right axillary lymph node involvement and 1 out of 26 lymph nodes.   Prior Therapy:  He is status post biopsy biopsy at that time showed a superficial spreading melanoma with ulceration measuring 1.25 mm.    He underwent wide excision with the final staging was T2BN0 stage IIA.    He developed axillary melanoma recurrence in 2012.  He underwent lymph node dissection and found to have 1 out of 26 lymph node that was positive with the largest node measuring 4.8 cm.    He received radiation therapy between November and December 2012 to the right axilla.    He subsequently treated with adjuvant ipilimumab up till February 2013.  Therapy was discontinued related to cellulitis in his hand.  She did develop acute hepatitis which could be related to ipilimumab in 2013.    He continues to be on active surveillance under the care of Dr. Harriet Masson [facility] ) till 2017 without any evidence of relapsed disease.     Current therapy: Active surveillance.  Interim History: Evan [name] returns today for a follow-up visit.  Since last visit, he reports no major changes in his health.  He did sustain a laceration on his right leg and has had difficulty healing over the last few weeks.  He did notice a protruding lesion that was evaluated by orthopedics.  He was referred to [facility] dermatology he was not able to see him due to insurance issues.  Clinically he feels well without any constitutional symptoms.  He denies any nausea vomiting or abdominal pain.  He denies any fevers or chills or sweats.  He denies any constitutional symptoms.     Medications: I have reviewed the patient's current  medications.  Current Outpatient Medications  Medication Sig Dispense Refill   acetaminophen (TYLENOL) 500 MG tablet Take 1 tablet (500 mg total) by mouth every 12 (twelve) hours. 60 tablet 1   atenolol (TENORMIN) 25 MG tablet Take 1 tablet (25 mg total) by mouth daily. 90 tablet 1   gabapentin (NEURONTIN) 100 MG capsule Take 1 capsule (100 mg total) by mouth 2 (two) times daily as needed (pain). 60 capsule 2   ibuprofen (ADVIL) 200 MG tablet Take 800-1,000 mg by mouth every 4 (four) hours as needed for headache or moderate pain.     losartan (COZAAR) 50 MG tablet Take 1 tablet (50 mg total) by mouth daily. 90 tablet 1   pantoprazole (PROTONIX) 40 MG tablet Take 1 tablet (40 mg total) by mouth daily. 30 tablet 1   zolpidem (AMBIEN) 5 MG tablet Take 1 tablet (5 mg total) by mouth at bedtime as needed for sleep. 30 tablet 0   No current facility-administered medications for this visit.     Allergies:  Allergies  Allergen Reactions   Contrast Media [Iodinated Contrast Media] Shortness Of Breath and Swelling    Shortness of breath., throat swelling   Fish Allergy Anaphylaxis    No reaction to shellfish    Past Medical History, Surgical history, Social history, and Family History were reviewed and updated.  Physical Exam: Blood pressure (!) 137/37, pulse 75, temperature 97.7 F (36.5 C), resp. rate 20, weight 292 lb 6.4 oz (132.6 kg),  SpO2 96 %.  ECOG: 1   General appearance: Comfortable appearing without any discomfort Head: Normocephalic without any trauma Oropharynx: Mucous membranes are moist and pink without any thrush or ulcers. Eyes: Pupils are equal and round reactive to light. Lymph nodes: No cervical, supraclavicular, inguinal or axillary lymphadenopathy.   Heart:regular rate and rhythm.  S1 and S2 without leg edema. Lung: Clear without any rhonchi or wheezes.  No dullness to percussion. Abdomin: Soft, nontender, nondistended with good bowel sounds.  No  hepatosplenomegaly. Musculoskeletal: No joint deformity or effusion.  Full range of motion noted. Neurological: No deficits noted on motor, sensory and deep tendon reflex exam. Skin: Healing wound with protruding scab noted on the lateral aspect of his right leg.     Lab Results: Lab Results  Component Value Date   WBC 9.0 10/18/2022   HGB 13.6 10/18/2022   HCT 41.7 10/18/2022   MCV 85.2 10/18/2022   PLT 345.0 10/18/2022     Chemistry      Component Value Date/Time   NA 137 10/18/2022 1347   K 4.5 10/18/2022 1347   CL 101 10/18/2022 1347   CO2 26 10/18/2022 1347   BUN 25 (H) 10/18/2022 1347   CREATININE 1.41 10/18/2022 1347   CREATININE 0.92 06/23/2019 1543      Component Value Date/Time   CALCIUM 9.9 10/18/2022 1347   ALKPHOS 92 10/18/2022 1347   AST 16 10/18/2022 1347   ALT 9 10/18/2022 1347   BILITOT 0.2 10/18/2022 1347       Narrative & Impression  CLINICAL DATA:  Subsequent treatment strategy for metastatic melanoma. Radiation therapy and immunotherapy in 2015.   EXAM: NUCLEAR MEDICINE PET SKULL BASE TO THIGH   TECHNIQUE: 13.2 mCi F-18 FDG was injected intravenously. Full-ring PET imaging was performed from the skull base to thigh after the radiotracer. CT data was obtained and used for attenuation correction and anatomic localization.   Fasting blood glucose: 110 mg/dl   COMPARISON:  CTA chest 03/29/2022. Abdominopelvic CT 10/17/2020. PET of 08/23/2011   FINDINGS: Mediastinal blood pool activity: SUV max 2.4   Liver activity: SUV max NA   NECK: A left posterior triangle 5 mm node corresponds to mild hypermetabolism at a S.U.V. max of 3.0 on 52/4.   Incidental CT findings: No cervical adenopathy.   CHEST: Relatively diffuse mild hypermetabolism at the right chest wall may be radiation induced and is relatively non focal/masslike. Example at a S.U.V. max of 6.6 including on image 83/4.   A deep left axillary node measures 1.1 cm and a S.U.V.  max of 7.1 on 73/4. Similar in size on 03/29/2022.   Lateral left chest wall subcutaneous increased density measures 3.0 cm and a S.U.V. max of 3.2 on 73/4.   Right lateral chest wall subcutaneous soft tissue thickening versus less likely gynecomastia at 1.3 cm and a S.U.V. max of 2.9 on 93/4.   Incidental CT findings: Aortic atherosclerosis. Mild centrilobular emphysema. Right apical radiation fibrosis.   ABDOMEN/PELVIS: No abdominal parenchymal or nodal hypermetabolism. Hypermetabolic left pelvic nodes. Example left external iliac node at 1.1 cm and a S.U.V. max of 9.0 on 193/4.   Left inguinal node measures 1.0 x 2.6 cm and a S.U.V. max of 9.2 on 218/4.   Incidental CT findings: Abdominal aortic atherosclerosis. Mild renal cortical thinning bilaterally. Normal adrenal glands. No bowel obstruction.   SKELETON: Multifocal upper extremity hypermetabolism bilaterally. Example about the posterior left forearm subcutaneous hypermetabolism at a S.U.V. max of 8.5 on image  188 of series 4. About the lateral right forearm just caudal to the antecubital fossa (possibly a site of injection) at a S.U.V. max of 10.1.   A focus of hypermetabolism about the left zygoma measures a S.U.V. max of 7.0 on 22/4.   Spinous process hypermetabolism without CT correlate at approximately the C7 level measures a S.U.V. max of 5.0.   Incidental CT findings: None.   IMPRESSION: 1. The most suspicious findings include left axillary and pelvic hypermetabolic adenopathy, favoring recurrent/metastatic melanoma. Possible low left cervical nodal metastasis. 2. Multifocal subcutaneous hypermetabolism including about the anterior chest wall and both upper extremities. Nonspecific. Recommend physical exam correlation to exclude metachronous melanoma or metastatic disease. 3. Relatively diffuse right chest wall low-level hypermetabolism, favored to be postsurgical and/or radiation induced. 4. Nonspecific  hypermetabolism about the left zygoma and spinous process of approximately C7. Cannot exclude an atypical distribution of osseous metastasis. Recommend attention on follow-up. 5. Incidental findings, including: Aortic atherosclerosis (ICD10-I70.0) and emphysema (ICD10-J43.9).     Impression and Plan:   52 year old man with:   Abdominal wall melanoma diagnosed in 2009.  He was found to have stage IIA disease and subsequently developed stage III with axillary lymph node involvement in 2012.  He remained on active surveillance after surgical resection and adjuvant immunotherapy till 2017.   PET scan obtained in September 2023 showed left axillary and pelvic adenopathy that is suspicious although reactive lymphadenopathy could not be ruled out.  Clinically she does not have any signs or symptoms to suggest recurrent disease but I recommended repeat PET imaging before this visit.  His PET scan is scheduled till the end of January however.  Depending on the results of the PET scan we will determine best course of action.  If these lymph nodes remain prominent or enlarging, obtaining tissue biopsy would be recommended to determine the etiology.  He has metastatic melanoma, restarting treatment with combination and ipilimumab and nivolumab or BRAF targeted therapy could be considered.  If his biopsy or PET scan does not show any evidence of malignancy, continued surveillance would be recommended.  He will complete a PET scan in the next few weeks and have medical oncology follow-up after that.   2.  Dermatology surveillance: We will refer back to dermatology for continued surveillance as well as evaluation of his right lower extremity lesion.   3.  Lower extremity wounds: He follows with vascular and orthopedic surgery with a previous history of nonhealing ulcers.     4.  Follow-up: Will be in the next 4 weeks after his PET scan.   30  minutes were spent on this encounter.  The time was  dedicated to updating disease status, treatment choices and outlining future plan of care discussion.    Zola Button, MD 1/10/20243:17 PM

## 2023-01-02 NOTE — Telephone Encounter (Signed)
Per Dr Alen Blew request, referral information sent to [facility] - demographic sheet & today's office note.  Fax confirmation received.

## 2023-01-03 NOTE — Telephone Encounter (Signed)
Chart opened in error

## 2023-01-11 ENCOUNTER — Ambulatory Visit (INDEPENDENT_AMBULATORY_CARE_PROVIDER_SITE_OTHER): Payer: No Typology Code available for payment source | Admitting: Family

## 2023-01-11 DIAGNOSIS — C439 Malignant melanoma of skin, unspecified: Secondary | ICD-10-CM

## 2023-01-15 NOTE — Progress Notes (Signed)
Office Visit Note   Patient: Evan [name]           Date of Birth: 1971/08/12           MRN: [id number] Visit Date: 01/11/2023              Requested by: Ailene Ards, NP [address] [place],  [place] [address] PCP: Ailene Ards, NP  Chief Complaint  Patient presents with   Right Leg - Wound Check      HPI: The patient is a 52 year old gentleman who is seen today in follow-up for worsening of the ulcer to his left right lower extremity he has had some pain in the area over the holidays with scant bloody drainage.  He is currently being followed by oncology for metastatic melanoma.  Has not had this ulcer biopsy  Assessment & Plan: Visit Diagnoses: No diagnosis found.  Plan: Will not attempt to unroofed for debridement.  Have discussed dry dressing changes and close monitoring.  Presumed melanoma.  Have had difficulty getting the patient in with dermatology for a biopsy.  The patient is followed by oncology as well.  Follow-Up Instructions: No follow-ups on file.   Ortho Exam  Patient is alert, oriented, no adenopathy, well-dressed, normal affect, normal respiratory effort. Please see attached image.  On examination of the right lower extremity the patient has 1+ pitting edema with hemosiderin staining.  There is a lateral ulcer covered with eschar and nonviable tissue.  There is no surrounding erythema or active drainage.  Scant dried blood.  Concerning for melanoma.  Imaging: No results found.   Labs: Lab Results  Component Value Date   HGBA1C 5.5 10/18/2022   HGBA1C 5.6 04/17/2019   ESRSEDRATE 72 (H) 05/19/2019   ESRSEDRATE 59 (H) 05/05/2019   ESRSEDRATE 75 (H) 04/17/2019   CRP 65.7 (H) 06/23/2019   CRP 61.5 (H) 06/04/2019   CRP 47.8 (H) 05/26/2019   LABURIC 5.6 03/26/2022   REPTSTATUS 04/01/2022 FINAL 03/26/2022   GRAMSTAIN  04/17/2019    RARE WBC PRESENT,BOTH PMN AND MONONUCLEAR NO ORGANISMS SEEN    CULT  03/26/2022    NO GROWTH 5  DAYS Performed at [facility], [place] [address], [place], [place] [address]      Lab Results  Component Value Date   ALBUMIN 4.4 10/18/2022   ALBUMIN 3.8 10/17/2020   ALBUMIN 2.4 (L) 05/06/2019    No results found for: "MG" Lab Results  Component Value Date   VD25OH 32.0 04/17/2019    No results found for: "PREALBUMIN"    Latest Ref Rng & Units 10/18/2022    1:47 PM 03/28/2022    4:33 AM 03/27/2022    5:55 AM  CBC EXTENDED  WBC 4.0 - 10.5 K/uL 9.0  3.7  5.6   RBC 4.22 - 5.81 Mil/uL 4.89  3.87  3.77   Hemoglobin 13.0 - 17.0 g/dL 13.6  10.9  10.4   HCT 39.0 - 52.0 % 41.7  33.7  32.6   Platelets 150.0 - 400.0 K/uL 345.0  261  236      There is no height or weight on file to calculate BMI.  Orders:  No orders of the defined types were placed in this encounter.  No orders of the defined types were placed in this encounter.    Procedures: No procedures performed  Clinical Data: No additional findings.  ROS:  All other systems negative, except as noted in the HPI. Review of  Systems  Objective: Vital Signs: There were no vitals taken for this visit.  Specialty Comments:  No specialty comments available.  PMFS History: Patient Active Problem List   Diagnosis Date Noted   Panic disorder 10/18/2022   Class 2 obesity with body mass index (BMI) of 36.0 to 36.9 in adult 10/18/2022   Neuropathy 10/18/2022   Venous stasis ulcer of calf limited to breakdown of skin with varicose veins (HCC)    Cellulitis 03/27/2022   AKI (acute kidney injury) ([place]) 03/26/2022   Multiple open wounds of lower leg 03/26/2022   Dyspnea on exertion 03/26/2022   Great toe pain, right 03/26/2022   IV drug abuse ([place])    Septic arthritis of knee, right ([place]) 79/89/2119   Hardware complicating wound infection ([place]) 04/17/2019   Hepatitis C virus infection cured after antiviral drug therapy 04/17/2019   Acute lateral meniscus tear of right knee 01/23/2019   Displaced fracture of  right tibial tuberosity, initial encounter for closed fracture 01/16/2019   Closed bicondylar fracture of right tibial plateau 01/15/2019   Cough 10/31/2016   Alcohol abuse with intoxication, with delirium ([place]) 10/03/2016   Attention deficit disorder (ADD) in adult 10/03/2016   Depression 06/15/2014   Acute bronchitis 01/28/2014   Chronic pain syndrome 01/28/2014   Hepatitis, chronic persistent ([place]) 05/14/2013   ADD (attention deficit disorder) 05/11/2013   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   Eczema 12/30/2012   Disorder of joints of multiple sites 10/02/2012   Acute and subacute liver necrosis 04/24/2012   Metastatic melanoma 10/17/2011   Chest pain 10/17/2011   Insomnia 12/20/2008   Anxiety state 12/10/2007   ERECTILE DYSFUNCTION 12/10/2007   Essential hypertension 11/21/2007   Past Medical History:  Diagnosis Date   ADD (attention deficit disorder) 05/11/2013   ANXIETY 12/10/2007   BACK PAIN 08/03/2009   Chronic pain syndrome 01/28/2014   Depression 06/15/2014   ERECTILE DYSFUNCTION 12/10/2007   Hepatitis C 05/15/2013     "treated and cured"   HYPERTENSION 11/21/2007   INSOMNIA-SLEEP DISORDER-UNSPEC 12/20/2008   Melanoma ([place]) 2015   Lymph nodes left side , radiation   Neuropathy    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   Preventative health care 05/14/2011    Family History  Problem Relation Age of Onset   Cancer Father        melanoma on back   Cancer Other        pancreatic    Past Surgical History:  Procedure Laterality Date   EXTERNAL FIXATION LEG Right 01/16/2019   Procedure: EXTERNAL FIXATION RIGHT KNEE;  Surgeon: Shona Needles, MD;  Location: [facility];  Service: Orthopedics;  Laterality: Right;   HARDWARE REMOVAL Right 04/17/2019   Procedure: HARDWARE REMOVAL RIGHT TIBIA;  Surgeon: Shona Needles, MD;  Location: [facility];  Service: Orthopedics;  Laterality: Right;   IRRIGATION AND DEBRIDEMENT KNEE Right 04/17/2019   Procedure: IRRIGATION  AND DEBRIDEMENT RIGHT KNEE;  Surgeon: Shona Needles, MD;  Location: [facility];  Service: Orthopedics;  Laterality: Right;   LYMPHADENECTOMY Left    ORIF TIBIA PLATEAU Right 01/20/2019   Procedure: OPEN REDUCTION INTERNAL FIXATION (ORIF) TIBIAL PLATEAU;  Surgeon: Shona Needles, MD;  Location: [facility];  Service: Orthopedics;  Laterality: Right;   SHOULDER SURGERY Left    chronic L dislocating    Social History   Occupational History   Occupation: works in [employer] Use   Smoking status: Former    Types: Cigarettes    Quit  date: 2000    Years since quitting: 24.0   Smokeless tobacco: Never   Tobacco comments:    in college  Vaping Use   Vaping Use: Never used  Substance and Sexual Activity   Alcohol use: Not Currently   Drug use: No   Sexual activity: Not on file

## 2023-01-16 ENCOUNTER — Encounter (HOSPITAL_COMMUNITY): Admission: RE | Admit: 2023-01-16 | Payer: No Typology Code available for payment source | Source: Ambulatory Visit

## 2023-01-23 ENCOUNTER — Ambulatory Visit: Payer: Medicaid Other | Admitting: Internal Medicine

## 2023-01-24 NOTE — Telephone Encounter (Signed)
The soonest new patient appointment with a dermatologist that takes [employer] and Medicaid is May/June with [facility] in [place] or Evan [name]. Waiting for completed ov notes before sending referral.

## 2023-01-30 ENCOUNTER — Encounter: Payer: Self-pay | Admitting: Family

## 2023-01-30 NOTE — Telephone Encounter (Signed)
Perfect, note done  This is not urgent. We can just get their next available

## 2023-02-01 ENCOUNTER — Encounter (HOSPITAL_COMMUNITY)
Admission: RE | Admit: 2023-02-01 | Discharge: 2023-02-01 | Disposition: A | Discharge status: Home / Self Care | Payer: Medicaid Other | Source: Ambulatory Visit | Attending: Oncology | Admitting: Oncology

## 2023-02-01 DIAGNOSIS — C439 Malignant melanoma of skin, unspecified: Secondary | ICD-10-CM | POA: Diagnosis present

## 2023-02-01 LAB — GLUCOSE, CAPILLARY: Glucose-Capillary: 106 mg/dL — ABNORMAL HIGH (ref 70–99)

## 2023-02-01 MED ORDER — FLUDEOXYGLUCOSE F - 18 (FDG) INJECTION
15.0000 | Freq: Once | INTRAVENOUS | Status: AC | PRN
  Administered 2023-02-01: 13.75 via INTRAVENOUS

## 2023-02-04 NOTE — Progress Notes (Unsigned)
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  He developed stage III in 2012 with right axillary lymph node involvement and 1 out of 26 lymph nodes. ***  PRIOR THERAPY: 1) He is status post biopsy biopsy at that time showed a superficial spreading melanoma with ulceration measuring 1.25 mm.    2) He underwent wide excision with the final staging was T2BN0 stage IIA.    3) He developed axillary melanoma recurrence in 2012.  He underwent lymph node dissection and found to have 1 out of 26 lymph node that was positive with the largest node measuring 4.8 cm.    4) He received radiation therapy between November and December 2012 to the right axilla.    5) He subsequently treated with adjuvant ipilimumab up till February 2013.  Therapy was discontinued related to cellulitis in his hand.  She did develop acute hepatitis which could be related to ipilimumab in 2013.    6) He continues to be on active surveillance under the care of Dr. Harriet Masson [facility] ) till 2017 without any evidence of relapsed disease.     CURRENT THERAPY: ***  INTERVAL HISTORY: Evan [name] 52 y.o. male returns clinic today for follow-up visit.  The patient was last seen by Dr. Alen Blew on 01/02/2023.  Dr. Alen Blew has left the practice and the patient will be transitioning his care to Dr. Julien Nordmann.  When the patient last saw Dr. Alen Blew, he was being watched closely with surveillance PET scans.  She was supposed to have a PET scan prior to his last appointment but it was not scheduled until ***.  They were monitoring her metabolic adenopathy on PET scan to assess for disease recurrence.  Since last being seen the patient denies any major changes in his health.  He continues having on healing ulcer on his ***ankle.  The patient was previously referred to dermatology but was  having trouble getting an appointment due to insurance constraints.  Therefore Dr. Alen Blew had referred him to a different dermatology practice and his appointment is ***.  He is followed by orthopedic surgery and vascular for nonhealing lower extremity wounds.  Otherwise the patient denies any fever, chills, night sweats, or unexplained weight loss.  Denies any chest pain, shortness of breath, cough, or hemoptysis.  Denies any nausea, vomiting, diarrhea, or constipation.  Rashes and skin changes?  Lymphadenopathy?  Any other areas of skin infection?  Denies any headache or visual changes.  Sees Dr. Harriet Masson at [facility]? When?  The patient is here today to review the results of his PET scan and for more detailed discussion about his current condition and recommended treatment options. Along his ankle wound and there?  Started as a laceration?   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  MEDICATIONS:  Current Outpatient Medications  Medication Sig Dispense Refill   acetaminophen (TYLENOL) 500 MG tablet Take 1 tablet (500 mg total) by mouth every 12 (twelve) hours. 60 tablet 1   atenolol (TENORMIN) 25 MG tablet Take 1 tablet (25 mg total) by mouth daily. 90 tablet 1   gabapentin (NEURONTIN) 100 MG capsule Take 1 capsule (100 mg total) by mouth 2 (two) times daily as needed (pain). 60 capsule 2   ibuprofen (ADVIL) 200 MG tablet Take 800-1,000  mg by mouth every 4 (four) hours as needed for headache or moderate pain.     losartan (COZAAR) 50 MG tablet Take 1 tablet (50 mg total) by mouth daily. 90 tablet 1   pantoprazole (PROTONIX) 40 MG tablet Take 1 tablet (40 mg total) by mouth daily. 30 tablet 1   zolpidem (AMBIEN) 5 MG tablet Take 1 tablet (5 mg total) by mouth at bedtime as needed for sleep. 30 tablet 0   No current facility-administered medications for this visit.    SURGICAL HISTORY:  Past Surgical History:  Procedure Laterality Date   EXTERNAL FIXATION LEG Right 01/16/2019   Procedure: EXTERNAL FIXATION RIGHT KNEE;  Surgeon: Shona Needles, MD;  Location: [place];  Service: Orthopedics;  Laterality: Right;   HARDWARE REMOVAL Right 04/17/2019   Procedure: HARDWARE REMOVAL RIGHT TIBIA;  Surgeon: Shona Needles, MD;  Location: [place];  Service: Orthopedics;  Laterality: Right;   IRRIGATION AND DEBRIDEMENT KNEE Right 04/17/2019   Procedure: IRRIGATION AND DEBRIDEMENT RIGHT KNEE;  Surgeon: Shona Needles, MD;  Location: [place];  Service: Orthopedics;  Laterality: Right;   LYMPHADENECTOMY Left    ORIF TIBIA PLATEAU Right 01/20/2019   Procedure: OPEN REDUCTION INTERNAL FIXATION (ORIF) TIBIAL PLATEAU;  Surgeon: Shona Needles, MD;  Location: [place];  Service: Orthopedics;  Laterality: Right;   SHOULDER SURGERY Left    chronic L dislocating     REVIEW OF SYSTEMS:   Review of Systems  Constitutional: Negative for appetite change, chills, fatigue, fever and unexpected weight change.  HENT:   Negative for mouth sores, nosebleeds, sore throat and trouble swallowing.   Eyes: Negative for eye problems and icterus.  Respiratory: Negative for cough, hemoptysis, shortness of breath and wheezing.   Cardiovascular: Negative for chest pain and leg swelling.  Gastrointestinal: Negative for abdominal pain, constipation, diarrhea, nausea and vomiting.  Genitourinary: Negative for bladder incontinence, difficulty urinating, dysuria, frequency  and hematuria.   Musculoskeletal: Negative for back pain, gait problem, neck pain and neck stiffness.  Skin: Negative for itching and rash.  Neurological: Negative for dizziness, extremity weakness, gait problem, headaches, light-headedness and seizures.  Hematological: Negative for adenopathy. Does not bruise/bleed easily.  Psychiatric/Behavioral: Negative for confusion, depression and sleep disturbance. The patient is not nervous/anxious.     PHYSICAL EXAMINATION:  There were no vitals taken for this visit.  ECOG PERFORMANCE STATUS: {CHL ONC ECOG [id number]  Physical Exam  Constitutional: Oriented to person, place, and time and well-developed, well-nourished, and in no distress. No distress.  HENT:  Head: Normocephalic and atraumatic.  Mouth/Throat: Oropharynx is clear and moist. No oropharyngeal exudate.  Eyes: Conjunctivae are normal. Right eye exhibits no discharge. Left eye exhibits no discharge. No scleral icterus.  Neck: Normal range of motion. Neck supple.  Cardiovascular: Normal rate, regular rhythm, normal heart sounds and intact distal pulses.   Pulmonary/Chest: Effort normal and breath sounds normal. No respiratory distress. No wheezes. No rales.  Abdominal: Soft. Bowel sounds are normal. Exhibits no  distension and no mass. There is no tenderness.  Musculoskeletal: Normal range of motion. Exhibits no edema.  Lymphadenopathy:    No cervical adenopathy.  Neurological: Alert and oriented to person, place, and time. Exhibits normal muscle tone. Gait normal. Coordination normal.  Skin: Skin is warm and dry. No rash noted. Not diaphoretic. No erythema. No pallor.  Psychiatric: Mood, memory and judgment normal.  Vitals reviewed.  LABORATORY DATA: Lab Results  Component Value Date   WBC 9.0 10/18/2022   HGB 13.6 10/18/2022   HCT 41.7 10/18/2022   MCV 85.2 10/18/2022   PLT 345.0 10/18/2022      Chemistry      Component Value Date/Time   NA 137 10/18/2022 1347   K  4.5 10/18/2022 1347   CL 101 10/18/2022 1347   CO2 26 10/18/2022 1347   BUN 25 (H) 10/18/2022 1347   CREATININE 1.41 10/18/2022 1347   CREATININE 0.92 06/23/2019 1543      Component Value Date/Time   CALCIUM 9.9 10/18/2022 1347   ALKPHOS 92 10/18/2022 1347   AST 16 10/18/2022 1347   ALT 9 10/18/2022 1347   BILITOT 0.2 10/18/2022 1347       RADIOGRAPHIC STUDIES:  NM PET Image Restage (PS) Whole Body  Result Date: 02/03/2023 CLINICAL DATA:  Subsequent treatment strategy for melanoma. EXAM: NUCLEAR MEDICINE PET WHOLE BODY TECHNIQUE: 13.8 mCi F-18 FDG was injected intravenously. Full-ring PET imaging was performed from the head to foot after the radiotracer. CT data was obtained and used for attenuation correction and anatomic localization. Fasting blood glucose: 106 mg/dl COMPARISON:  09/07/2022 FINDINGS: Mediastinal blood pool activity: SUV max 3.5 HEAD/NECK: Previously reported uptake in a left posterior triangle lymph node is similar today with node measuring 5 mm on image 70/4 and SUV max = 2.3 today compared to 3.0 previously. Incidental CT findings: none CHEST: Deep left axillary lymph node identified previously remains hypermetabolic. 2 10 mm short axis today on 93/3 with SUV max = 5.4 today compared to 7.1 previously. A second more posterior left axillary lymph node measuring 7 mm short axis on 97/4 shows low level hypermetabolism with SUV max = 3.1, similar to prior. There is diffuse uptake along the left chest wall, as before. Focal increased uptake is identified in the left anterior paramidline region of the mid chest, superficial to the mid sternum. This is seen on image 105/4 and corresponds to a focal area of subcutaneous soft tissue density and tiny subcutaneous gas bubble. SUV max = 7.3. Incidental CT findings: Similar scarring anterior right lung apex. No new suspicious pulmonary nodule or mass ABDOMEN/PELVIS: No abnormal hypermetabolic activity within the liver, pancreas, adrenal  glands, or spleen. No hypermetabolic lymph nodes in the abdomen. The left groin node shows decreased hypermetabolism in the interval with SUV max = 2.4 today compared to 9.2 previously. No substantial change in size. Focal hypermetabolic uptake identified in a small bowel loop in the right pelvis (image 200/4) with no discernible soft tissue mass lesion on CT imaging. SUV max = 10.6. Incidental CT findings: There is mild atherosclerotic calcification of the abdominal aorta without aneurysm. SKELETON: No focal hypermetabolic activity to suggest skeletal metastasis. Incidental CT findings: Fixation hardware noted proximal right tibia. EXTREMITIES: Activity in the right forearm likely related to the injection site. Tiny focus of uptake identified along the skin of the lateral left ankle with SUV max = 3.3. Incidental CT findings: none IMPRESSION: 1. Similar appearance of left axillary and left posterior triangle hypermetabolic lymph  nodes. 2. Focal hypermetabolism identified in the left anterior paramidline mid chest, superficial to the mid sternum. This corresponds to a focal area of subcutaneous soft tissue density and tiny subcutaneous gas bubble. This may be infectious/inflammatory or a site of recent procedure although correlation recommended as metastatic disease not excluded. 3. No substantial change in size of the index left groin node identified previously although hypermetabolism has decreased substantially in the interval. 4. Focal hypermetabolism identified in a small bowel loop in the right pelvis without discernible soft tissue mass lesion on CT imaging. This may be physiologic but attention on follow-up recommended. 5. Tiny focus of uptake along the skin of the lateral left ankle. This region should be amenable to clinical inspection. 6.  Aortic Atherosclerosis (ICD10-I70.0). Electronically Signed   By: Misty Stanley M.D.   On: 02/03/2023 09:45     ASSESSMENT/PLAN:  This is a very pleasant  52 year old Caucasian male with history of melanoma.  The patient was diagnosed with abdominal wall melanoma stage IIa in 2009 status post excision.  He then developed stage III disease with axillary lymph node involvement in 2012 for which he underwent treatment with ipilimumab developed cellulitis and hepatitis.  He has been on surveillance  The patient had a PET scan performed in September 2023 that showed left axillary and pelvic adenopathy which could be reactive but metastatic lymphadenopathy cannot be ruled out.  Therefore, he had a repeat PET scan performed recently.  The patient was seen with Dr. Julien Nordmann today.  Dr. Julien Nordmann personally independently reviewed the PET scan discussed the results with the patient today.  The scan showed (((  Dr. Julien Nordmann recommends a biopsy of the most accessible lesion to rule out disease recurrence.  I placed an order for ***  Regarding the ankle lesion, this started off as a laceration that has not been healing.  The patient was referred to dermatology but his appointment is not until***.   Per Dr. Hazeline Junker last note, if he is found to have metastatic melanoma then Dr. Alen Blew recommended restarting treatment with combination ipilimumab and nivolumab or BRAF targeted therapy.  Continue to follow with vascular and Ortho?  The patient was advised to call immediately if she has any concerning symptoms in the interval. The patient voices understanding of current disease status and treatment options and is in agreement with the current care plan. All questions were answered. The patient knows to call the clinic with any problems, questions or concerns. We can certainly see the patient much sooner if necessary   No orders of the defined types were placed in this encounter.    I spent {CHL ONC TIME VISIT - [id number] counseling the patient face to face. The total time spent in the appointment was {CHL ONC TIME VISIT - [id number].  Jamillia Closson L  Machell Wirthlin, PA-C 02/04/23

## 2023-02-05 ENCOUNTER — Ambulatory Visit: Payer: Medicaid Other | Admitting: Physician Assistant

## 2023-02-06 ENCOUNTER — Inpatient Hospital Stay: Payer: Medicaid Other | Attending: Physician Assistant | Admitting: Physician Assistant

## 2023-02-06 ENCOUNTER — Other Ambulatory Visit: Payer: Self-pay

## 2023-02-06 VITALS — BP 156/83 | HR 82 | Temp 98.1°F | Resp 16 | Wt 295.4 lb

## 2023-02-06 DIAGNOSIS — C439 Malignant melanoma of skin, unspecified: Secondary | ICD-10-CM | POA: Diagnosis not present

## 2023-02-06 DIAGNOSIS — Z85820 Personal history of malignant melanoma of skin: Secondary | ICD-10-CM | POA: Insufficient documentation

## 2023-02-08 ENCOUNTER — Ambulatory Visit: Payer: No Typology Code available for payment source | Admitting: Family

## 2023-02-11 ENCOUNTER — Other Ambulatory Visit: Payer: Self-pay

## 2023-02-11 DIAGNOSIS — S81801D Unspecified open wound, right lower leg, subsequent encounter: Secondary | ICD-10-CM

## 2023-02-11 DIAGNOSIS — C439 Malignant melanoma of skin, unspecified: Secondary | ICD-10-CM

## 2023-02-11 NOTE — Telephone Encounter (Signed)
Submitted referral to [facility] - hopefully they will accept him now that he only has Medicaid [place], and not [place]. See referrals.

## 2023-03-13 ENCOUNTER — Ambulatory Visit (INDEPENDENT_AMBULATORY_CARE_PROVIDER_SITE_OTHER): Payer: Medicaid Other | Admitting: Family

## 2023-03-13 ENCOUNTER — Encounter: Payer: Self-pay | Admitting: Family

## 2023-03-13 DIAGNOSIS — L97911 Non-pressure chronic ulcer of unspecified part of right lower leg limited to breakdown of skin: Secondary | ICD-10-CM | POA: Diagnosis not present

## 2023-03-13 DIAGNOSIS — S81801D Unspecified open wound, right lower leg, subsequent encounter: Secondary | ICD-10-CM

## 2023-03-13 MED ORDER — SULFAMETHOXAZOLE-TRIMETHOPRIM 800-160 MG PO TABS: 1.0000 | ORAL_TABLET | Freq: Two times a day (BID) | ORAL | 0 refills | Status: DC

## 2023-03-13 NOTE — Progress Notes (Signed)
Office Visit Note   Patient: Evan [name]           Date of Birth: 1971-11-06           MRN: [id number] Visit Date: 03/13/2023              Requested by: Ailene Ards, NP [place],  [place] [address] PCP: Ailene Ards, NP  Chief Complaint  Patient presents with   Right Leg - Wound Check      HPI: The patient is a 52 year old gentleman who returns today for concern of worsening of his chronic ulcer to the right lower leg..  This ulcer per report was the result of a traumatic injury there was a skin tear which went on to be nonhealing.  We then following him for this over a year now.  We have had significant difficulty getting him in with dermatology for possible biopsy as he does have a history of metastatic melanoma.  Has not had this area biopsied previously  Patient relates that this wound did completely heal in the interim between our last visits.  Today he relates that he has been more active working outside feels that his legs got more swollen and then unfortunately had return and further breakdown of his wound today has an ABD in place with [place] and has been having weeping from the right lower extremity  Please see attached image  Patient endorses feeling poorly overall denies fevers  Assessment & Plan: Visit Diagnoses: No diagnosis found.  Plan: Discussed concern for melanoma with the patient.  He would like to continue with local wound care.  Given some Vaseline gauze today for some dressing changes.  Stressed the importance of follow-up with oncology and dermatology.  Placed on course of Bactrim for his cellulitis. discussed return precautions  Follow-Up Instructions: No follow-ups on file.   Ortho Exam  Patient is alert, oriented, no adenopathy, well-dressed, normal affect, normal respiratory effort. On examination of the right lower extremity there is a significant worsening of the chronic ulcer to the right lower extremity the shape of  his original chronic ulcer is remains present.  There is significant extension with maceration and excoriation.  There is weeping serous fluid there is warmth there is no ascending cellulitis  Imaging: No results found.   Labs: Lab Results  Component Value Date   HGBA1C 5.5 10/18/2022   HGBA1C 5.6 04/17/2019   ESRSEDRATE 72 (H) 05/19/2019   ESRSEDRATE 59 (H) 05/05/2019   ESRSEDRATE 75 (H) 04/17/2019   CRP 65.7 (H) 06/23/2019   CRP 61.5 (H) 06/04/2019   CRP 47.8 (H) 05/26/2019   LABURIC 5.6 03/26/2022   REPTSTATUS 04/01/2022 FINAL 03/26/2022   GRAMSTAIN  04/17/2019    RARE WBC PRESENT,BOTH PMN AND MONONUCLEAR NO ORGANISMS SEEN    CULT  03/26/2022    NO GROWTH 5 DAYS Performed at [facility], [place] [address] [place], [place] [address]      Lab Results  Component Value Date   ALBUMIN 4.4 10/18/2022   ALBUMIN 3.8 10/17/2020   ALBUMIN 2.4 (L) 05/06/2019    No results found for: "MG" Lab Results  Component Value Date   VD25OH 32.0 04/17/2019    No results found for: "PREALBUMIN"    Latest Ref Rng & Units 10/18/2022    1:47 PM 03/28/2022    4:33 AM 03/27/2022    5:55 AM  CBC EXTENDED  WBC 4.0 - 10.5 K/uL 9.0  3.7  5.6   RBC 4.22 - 5.81 Mil/uL 4.89  3.87  3.77   Hemoglobin 13.0 - 17.0 g/dL 13.6  10.9  10.4   HCT 39.0 - 52.0 % 41.7  33.7  32.6   Platelets 150.0 - 400.0 K/uL 345.0  261  236      There is no height or weight on file to calculate BMI.  Orders:  No orders of the defined types were placed in this encounter.  Meds ordered this encounter  Medications   sulfamethoxazole-trimethoprim (BACTRIM DS) 800-160 MG tablet    Sig: Take 1 tablet by mouth 2 (two) times daily.    Dispense:  20 tablet    Refill:  0     Procedures: No procedures performed  Clinical Data: No additional findings.  ROS:  All other systems negative, except as noted in the HPI. Review of Systems  Objective: Vital Signs: There were no vitals taken for this  visit.  Specialty Comments:  No specialty comments available.  PMFS History: Patient Active Problem List   Diagnosis Date Noted   Panic disorder 10/18/2022   Class 2 obesity with body mass index (BMI) of 36.0 to 36.9 in adult 10/18/2022   Neuropathy 10/18/2022   Venous stasis ulcer of calf limited to breakdown of skin with varicose veins (HCC)    Cellulitis 03/27/2022   AKI (acute kidney injury) ([place]) 03/26/2022   Multiple open wounds of lower leg 03/26/2022   Dyspnea on exertion 03/26/2022   Great toe pain, right 03/26/2022   IV drug abuse ([place])    Septic arthritis of knee, right ([place]) [id number]   Hardware complicating wound infection ([place]) 04/17/2019   Hepatitis C virus infection cured after antiviral drug therapy 04/17/2019   Acute lateral meniscus tear of right knee 01/23/2019   Displaced fracture of right tibial tuberosity, initial encounter for closed fracture 01/16/2019   Closed bicondylar fracture of right tibial plateau 01/15/2019   Cough 10/31/2016   Alcohol abuse with intoxication, with delirium ([place]) 10/03/2016   Attention deficit disorder (ADD) in adult 10/03/2016   Depression 06/15/2014   Acute bronchitis 01/28/2014   Chronic pain syndrome 01/28/2014   Hepatitis, chronic persistent ([place]) 05/14/2013   ADD (attention deficit disorder) 05/11/2013   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   Eczema 12/30/2012   Disorder of joints of multiple sites 10/02/2012   Acute and subacute liver necrosis 04/24/2012   Metastatic melanoma 10/17/2011   Chest pain 10/17/2011   Insomnia 12/20/2008   Anxiety state 12/10/2007   ERECTILE DYSFUNCTION 12/10/2007   Essential hypertension 11/21/2007   Past Medical History:  Diagnosis Date   ADD (attention deficit disorder) 05/11/2013   ANXIETY 12/10/2007   BACK PAIN 08/03/2009   Chronic pain syndrome 01/28/2014   Depression 06/15/2014   ERECTILE DYSFUNCTION 12/10/2007   Hepatitis C 05/15/2013     "treated and cured"    HYPERTENSION 11/21/2007   INSOMNIA-SLEEP DISORDER-UNSPEC 12/20/2008   Melanoma ([place]) 2015   Lymph nodes left side , radiation   Neuropathy    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   Preventative health care 05/14/2011    Family History  Problem Relation Age of Onset   Cancer Father        melanoma on back   Cancer Other        pancreatic    Past Surgical History:  Procedure Laterality Date   EXTERNAL FIXATION LEG Right 01/16/2019   Procedure: EXTERNAL FIXATION RIGHT KNEE;  Surgeon: Shona Needles, MD;  Location: [facility];  Service: Orthopedics;  Laterality: Right;   HARDWARE REMOVAL Right 04/17/2019   Procedure: HARDWARE REMOVAL RIGHT TIBIA;  Surgeon: Shona Needles, MD;  Location: [place];  Service: Orthopedics;  Laterality: Right;   IRRIGATION AND DEBRIDEMENT KNEE Right 04/17/2019   Procedure: IRRIGATION AND DEBRIDEMENT RIGHT KNEE;  Surgeon: Shona Needles, MD;  Location: [place];  Service: Orthopedics;  Laterality: Right;   LYMPHADENECTOMY Left    ORIF TIBIA PLATEAU Right 01/20/2019   Procedure: OPEN REDUCTION INTERNAL FIXATION (ORIF) TIBIAL PLATEAU;  Surgeon: Shona Needles, MD;  Location: [place];  Service: Orthopedics;  Laterality: Right;   SHOULDER SURGERY Left    chronic L dislocating    Social History   Occupational History   Occupation: works in [place] Use   Smoking status: Former    Types: Cigarettes    Quit date: 2000    Years since quitting: 24.2   Smokeless tobacco: Never   Tobacco comments:    in college  Vaping Use   Vaping Use: Never used  Substance and Sexual Activity   Alcohol use: Not Currently   Drug use: No   Sexual activity: Not on file

## 2023-03-29 ENCOUNTER — Ambulatory Visit: Payer: Medicaid Other | Admitting: Family

## 2023-04-05 ENCOUNTER — Ambulatory Visit: Payer: Medicaid Other | Admitting: Family

## 2023-04-17 ENCOUNTER — Ambulatory Visit (INDEPENDENT_AMBULATORY_CARE_PROVIDER_SITE_OTHER): Payer: Medicaid Other | Admitting: Family

## 2023-04-17 ENCOUNTER — Encounter: Payer: Self-pay | Admitting: Family

## 2023-04-17 DIAGNOSIS — L97211 Non-pressure chronic ulcer of right calf limited to breakdown of skin: Secondary | ICD-10-CM

## 2023-04-17 DIAGNOSIS — I83012 Varicose veins of right lower extremity with ulcer of calf: Secondary | ICD-10-CM | POA: Diagnosis not present

## 2023-04-17 NOTE — Progress Notes (Signed)
Office Visit Note   Patient: Evan [name]           Date of Birth: 01-May-1971           MRN: [id number] Visit Date: 04/17/2023              Requested by: Elenore Paddy, NP [address] [place],  [place] [address] PCP: Elenore Paddy, NP  Chief Complaint  Patient presents with   Right Leg - Wound Check      HPI: Mr. Crevier is a 52 year old gentleman who presents today in routine follow-up for chronic ulcer to his right lower extremity.  Today he relates that this has been ongoing for almost 2 years now.  He states that he initially bumped his right shin on a nightstand and had significant skin tear at that time.  This has been quite slow to all has worsened over the last 6 months.  History of venous insufficiency.  Is also following with oncology.  Assessment & Plan: Visit Diagnoses: No diagnosis found.  Plan: His ulcer looks much more healthy.  Mepilex dressing applied.  Discussed the importance of compression for venous insufficiency and wound healing.  The patient has compression garments at home which she will begin wearing plans to wear these for the next 1 month  Follow-Up Instructions: No follow-ups on file.   Ortho Exam  Patient is alert, oriented, no adenopathy, well-dressed, normal affect, normal respiratory effort. On examination of the right lower extremity there is trace edema.  Chronic venous insufficiency changes with hemosiderin staining.  Significant darkening of the bilateral lower extremities.  He has scant hair growth.  To the lateral right shin he has chronic ulceration this is 3 cm in diameter.  Today was debrided of eschar there is underlying thin slough and granulation.  There is no active drainage no erythema no warmth  Imaging: No results found. No images are attached to the encounter.  Labs: Lab Results  Component Value Date   HGBA1C 5.5 10/18/2022   HGBA1C 5.6 04/17/2019   ESRSEDRATE 72 (H) 05/19/2019   ESRSEDRATE 59 (H) 05/05/2019    ESRSEDRATE 75 (H) 04/17/2019   CRP 65.7 (H) 06/23/2019   CRP 61.5 (H) 06/04/2019   CRP 47.8 (H) 05/26/2019   LABURIC 5.6 03/26/2022   REPTSTATUS 04/01/2022 FINAL 03/26/2022   GRAMSTAIN  04/17/2019    RARE WBC PRESENT,BOTH PMN AND MONONUCLEAR NO ORGANISMS SEEN    CULT  03/26/2022    NO GROWTH 5 DAYS Performed at [facility], [address] [place], [place] [address]      Lab Results  Component Value Date   ALBUMIN 4.4 10/18/2022   ALBUMIN 3.8 10/17/2020   ALBUMIN 2.4 (L) 05/06/2019    No results found for: "MG" Lab Results  Component Value Date   VD25OH 32.0 04/17/2019    No results found for: "PREALBUMIN"    Latest Ref Rng & Units 10/18/2022    1:47 PM 03/28/2022    4:33 AM 03/27/2022    5:55 AM  CBC EXTENDED  WBC 4.0 - 10.5 K/uL 9.0  3.7  5.6   RBC 4.22 - 5.81 Mil/uL 4.89  3.87  3.77   Hemoglobin 13.0 - 17.0 g/dL 31.5  17.6  16.0   HCT 39.0 - 52.0 % 41.7  33.7  32.6   Platelets 150.0 - 400.0 K/uL 345.0  261  236      There is no height or weight on file to  calculate BMI.  Orders:  No orders of the defined types were placed in this encounter.  No orders of the defined types were placed in this encounter.    Procedures: No procedures performed  Clinical Data: No additional findings.  ROS:  All other systems negative, except as noted in the HPI. Review of Systems  Objective: Vital Signs: There were no vitals taken for this visit.  Specialty Comments:  No specialty comments available.  PMFS History: Patient Active Problem List   Diagnosis Date Noted   Panic disorder 10/18/2022   Class 2 obesity with body mass index (BMI) of 36.0 to 36.9 in adult 10/18/2022   Neuropathy 10/18/2022   Venous stasis ulcer of calf limited to breakdown of skin with varicose veins    Cellulitis 03/27/2022   AKI (acute kidney injury) 03/26/2022   Multiple open wounds of lower leg 03/26/2022   Dyspnea on exertion 03/26/2022   Great toe pain, right 03/26/2022    IV drug abuse    Septic arthritis of knee, right 04/17/2019   Hardware complicating wound infection 04/17/2019   Hepatitis C virus infection cured after antiviral drug therapy 04/17/2019   Acute lateral meniscus tear of right knee 01/23/2019   Displaced fracture of right tibial tuberosity, initial encounter for closed fracture 01/16/2019   Closed bicondylar fracture of right tibial plateau 01/15/2019   Cough 10/31/2016   Alcohol abuse with intoxication, with delirium 10/03/2016   Attention deficit disorder (ADD) in adult 10/03/2016   Depression 06/15/2014   Acute bronchitis 01/28/2014   Chronic pain syndrome 01/28/2014   Hepatitis, chronic persistent 05/14/2013   ADD (attention deficit disorder) 05/11/2013   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   Eczema 12/30/2012   Disorder of joints of multiple sites 10/02/2012   Acute and subacute liver necrosis 04/24/2012   Metastatic melanoma 10/17/2011   Chest pain 10/17/2011   Insomnia 12/20/2008   Anxiety state 12/10/2007   ERECTILE DYSFUNCTION 12/10/2007   Essential hypertension 11/21/2007   Past Medical History:  Diagnosis Date   ADD (attention deficit disorder) 05/11/2013   ANXIETY 12/10/2007   BACK PAIN 08/03/2009   Chronic pain syndrome 01/28/2014   Depression 06/15/2014   ERECTILE DYSFUNCTION 12/10/2007   Hepatitis C 05/15/2013     "treated and cured"   HYPERTENSION 11/21/2007   INSOMNIA-SLEEP DISORDER-UNSPEC 12/20/2008   Melanoma 2015   Lymph nodes left side , radiation   Neuropathy    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   Preventative health care 05/14/2011    Family History  Problem Relation Age of Onset   Cancer Father        melanoma on back   Cancer Other        pancreatic    Past Surgical History:  Procedure Laterality Date   EXTERNAL FIXATION LEG Right 01/16/2019   Procedure: EXTERNAL FIXATION RIGHT KNEE;  Surgeon: Roby Lofts, MD;  Location: [facility];  Service: Orthopedics;  Laterality:  Right;   HARDWARE REMOVAL Right 04/17/2019   Procedure: HARDWARE REMOVAL RIGHT TIBIA;  Surgeon: Roby Lofts, MD;  Location: [facility];  Service: Orthopedics;  Laterality: Right;   IRRIGATION AND DEBRIDEMENT KNEE Right 04/17/2019   Procedure: IRRIGATION AND DEBRIDEMENT RIGHT KNEE;  Surgeon: Roby Lofts, MD;  Location: [facility];  Service: Orthopedics;  Laterality: Right;   LYMPHADENECTOMY Left    ORIF TIBIA PLATEAU Right 01/20/2019   Procedure: OPEN REDUCTION INTERNAL FIXATION (ORIF) TIBIAL PLATEAU;  Surgeon: Roby Lofts, MD;  Location: [facility];  Service: Orthopedics;  Laterality: Right;   SHOULDER SURGERY Left    chronic L dislocating    Social History   Occupational History   Occupation: works in [employer]  Tobacco Use   Smoking status: Former    Types: Cigarettes    Quit date: 2000    Years since quitting: 24.3   Smokeless tobacco: Never   Tobacco comments:    in college  Vaping Use   Vaping Use: Never used  Substance and Sexual Activity   Alcohol use: Not Currently   Drug use: No   Sexual activity: Not on file

## 2023-04-23 ENCOUNTER — Other Ambulatory Visit: Payer: Self-pay | Admitting: Family

## 2023-05-15 ENCOUNTER — Telehealth: Payer: Self-pay | Admitting: Family

## 2023-05-15 NOTE — Telephone Encounter (Signed)
Did you want dressings ordered for the pt??

## 2023-05-15 NOTE — Telephone Encounter (Signed)
Patient called in about dressings Evan [name] was supposed to get mailed to him he has not yet received them please advise

## 2023-05-16 ENCOUNTER — Ambulatory Visit: Payer: Medicaid Other | Admitting: Orthopedic Surgery

## 2023-05-21 NOTE — Telephone Encounter (Signed)
Insurance would not cover for him

## 2023-05-27 ENCOUNTER — Encounter: Payer: Self-pay | Admitting: Physician Assistant

## 2023-05-27 ENCOUNTER — Encounter (HOSPITAL_COMMUNITY): Admission: RE | Admit: 2023-05-27 | Payer: Medicaid Other | Source: Ambulatory Visit

## 2023-05-27 NOTE — Progress Notes (Deleted)
[facility] OFFICE PROGRESS NOTE  Elenore Paddy, NP [address] [place] [address]  DIAGNOSIS: ***  PRIOR THERAPY:  CURRENT THERAPY:  INTERVAL HISTORY: Evan [name] 52 y.o. 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  MEDICATIONS:  Current Outpatient Medications  Medication Sig Dispense Refill   acetaminophen (TYLENOL) 500 MG tablet Take 1 tablet (500 mg total) by mouth every 12 (twelve) hours. 60 tablet 1   atenolol (TENORMIN) 25 MG tablet Take 1 tablet (25 mg total) by mouth daily. 90 tablet 1   gabapentin (NEURONTIN) 100 MG capsule Take 1 capsule (100 mg total) by mouth 2 (two) times daily as needed (pain). 60 capsule 2   ibuprofen (ADVIL) 200 MG tablet Take 800-1,000 mg by mouth every 4 (four) hours as needed for headache or moderate pain.     losartan (COZAAR) 50 MG tablet Take 1 tablet (50 mg total) by mouth daily. 90 tablet 1   pantoprazole (PROTONIX) 40 MG tablet Take 1 tablet (40 mg total) by mouth daily. 30 tablet 1   sulfamethoxazole-trimethoprim (BACTRIM DS) 800-160 MG tablet Take 1 tablet by mouth 2 (two) times daily. 20 tablet 0   zolpidem (AMBIEN) 5 MG tablet Take 1 tablet (5 mg total) by mouth at bedtime as needed for sleep. 30 tablet 0   No current facility-administered medications for this visit.    SURGICAL  HISTORY:  Past Surgical History:  Procedure Laterality Date   EXTERNAL FIXATION LEG Right 01/16/2019   Procedure: EXTERNAL FIXATION RIGHT KNEE;  Surgeon: Roby Lofts, MD;  [facility]: [facility];  Service: Orthopedics;  Laterality: Right;   HARDWARE REMOVAL Right 04/17/2019   Procedure: HARDWARE REMOVAL RIGHT TIBIA;  Surgeon: Roby Lofts, MD;  Location: [facility];  Service: Orthopedics;  Laterality: Right;   IRRIGATION AND DEBRIDEMENT KNEE Right 04/17/2019   Procedure: IRRIGATION AND DEBRIDEMENT RIGHT KNEE;  Surgeon: Roby Lofts, MD;  Location: [facility];  Service: Orthopedics;  Laterality: Right;   LYMPHADENECTOMY Left    ORIF TIBIA PLATEAU Right 01/20/2019   Procedure: OPEN REDUCTION INTERNAL FIXATION (ORIF) TIBIAL PLATEAU;  Surgeon: Roby Lofts, MD;  Location: [facility];  Service: Orthopedics;  Laterality: Right;   SHOULDER SURGERY Left    chronic L dislocating     REVIEW OF SYSTEMS:   Review of Systems  Constitutional: Negative for appetite change, chills, fatigue, fever and unexpected weight change.  HENT:   Negative for mouth sores, nosebleeds, sore throat and trouble swallowing.   Eyes: Negative for eye problems and icterus.  Respiratory: Negative for cough, hemoptysis, shortness of breath and wheezing.   Cardiovascular: Negative for chest pain and leg swelling.  Gastrointestinal: Negative for abdominal pain, constipation, diarrhea, nausea and vomiting.  Genitourinary: Negative for bladder incontinence, difficulty urinating, dysuria, frequency and hematuria.   Musculoskeletal: Negative for back  pain, gait problem, neck pain and neck stiffness.  Skin: Negative for itching and rash.  Neurological: Negative for dizziness, extremity weakness, gait problem, headaches, light-headedness and seizures.  Hematological: Negative for adenopathy. Does not bruise/bleed easily.  Psychiatric/Behavioral: Negative for confusion, depression and sleep disturbance. The patient is not nervous/anxious.      PHYSICAL EXAMINATION:  There were no vitals taken for this visit.  ECOG PERFORMANCE STATUS: {CHL ONC ECOG [id number]  Physical Exam  Constitutional: Oriented to person, place, and time and well-developed, well-nourished, and in no distress. No distress.  HENT:  Head: Normocephalic and atraumatic.  Mouth/Throat: Oropharynx is clear and moist. No oropharyngeal exudate.  Eyes: Conjunctivae are normal. Right eye exhibits no discharge. Left eye exhibits no discharge. No scleral icterus.  Neck: Normal range of motion. Neck supple.  Cardiovascular: Normal rate, regular rhythm, normal heart sounds and intact distal pulses.   Pulmonary/Chest: Effort normal and breath sounds normal. No respiratory distress. No wheezes. No rales.  Abdominal: Soft. Bowel sounds are normal. Exhibits no distension and no mass. There is no tenderness.  Musculoskeletal: Normal range of motion. Exhibits no edema.  Lymphadenopathy:    No cervical adenopathy.  Neurological: Alert and oriented to person, place, and time. Exhibits normal muscle tone. Gait normal. Coordination normal.  Skin: Skin is warm and dry. No rash noted. Not diaphoretic. No erythema. No pallor.  Psychiatric: Mood, memory and judgment normal.  Vitals reviewed.  LABORATORY DATA: Lab Results  Component Value Date   WBC 9.0 10/18/2022   HGB 13.6 10/18/2022   HCT 41.7 10/18/2022   MCV 85.2 10/18/2022   PLT 345.0 10/18/2022      Chemistry      Component Value Date/Time   NA 137 10/18/2022 1347   K 4.5 10/18/2022 1347   CL 101 10/18/2022 1347   CO2 26 10/18/2022 1347   BUN 25 (H) 10/18/2022 1347   CREATININE 1.41 10/18/2022 1347   CREATININE 0.92 06/23/2019 1543      Component Value Date/Time   CALCIUM 9.9 10/18/2022 1347   ALKPHOS 92 10/18/2022 1347   AST 16 10/18/2022 1347   ALT 9 10/18/2022 1347   BILITOT 0.2 10/18/2022 1347       RADIOGRAPHIC STUDIES:  No results found.   ASSESSMENT/PLAN:  No problem-specific  Assessment & Plan notes found for this encounter.   No orders of the defined types were placed in this encounter.    I spent {CHL ONC TIME VISIT - ZOXWR:[id number]} counseling the patient face to face. The total time spent in the appointment was {CHL ONC TIME VISIT - BJYNW:[id number]}.  Kaylob Wallen L Elly Haffey, PA-C 05/27/23

## 2023-05-28 ENCOUNTER — Telehealth: Payer: Self-pay | Admitting: Internal Medicine

## 2023-05-30 ENCOUNTER — Inpatient Hospital Stay: Payer: Medicaid Other | Attending: Internal Medicine

## 2023-05-30 ENCOUNTER — Inpatient Hospital Stay: Payer: Medicaid Other | Admitting: Physician Assistant

## 2023-05-30 ENCOUNTER — Telehealth: Payer: Self-pay | Admitting: Physician Assistant

## 2023-05-30 ENCOUNTER — Ambulatory Visit: Payer: Medicaid Other | Admitting: Orthopedic Surgery

## 2023-05-30 NOTE — Telephone Encounter (Signed)
The patient was scheduled for an office visit today; however, his PET scan is not scheduled for later this month. I had sent him a mychart message and called him to let him know we would recommend delaying his follow up for after his PET scan. His voicemail is full. He did not see the mychart message sent a few days ago. His appointment had been rescheduled to 6/24 (after the PET scan).

## 2023-06-12 ENCOUNTER — Encounter (HOSPITAL_COMMUNITY): Admission: RE | Admit: 2023-06-12 | Payer: Medicaid Other | Source: Ambulatory Visit

## 2023-06-14 ENCOUNTER — Telehealth: Payer: Self-pay | Admitting: Medical Oncology

## 2023-06-14 NOTE — Telephone Encounter (Signed)
LVM that his appt for 06/24 is cancelled because he has had a PET scan. I asked him to return my call.  I spoke to his mother and she will try to contact him.

## 2023-06-17 ENCOUNTER — Ambulatory Visit: Payer: Medicaid Other | Admitting: Orthopedic Surgery

## 2023-06-17 ENCOUNTER — Inpatient Hospital Stay: Payer: Medicaid Other | Admitting: Internal Medicine

## 2023-06-21 ENCOUNTER — Encounter: Payer: Self-pay | Admitting: Family

## 2023-06-21 ENCOUNTER — Ambulatory Visit (INDEPENDENT_AMBULATORY_CARE_PROVIDER_SITE_OTHER): Payer: Medicaid Other | Admitting: Family

## 2023-06-21 DIAGNOSIS — L97211 Non-pressure chronic ulcer of right calf limited to breakdown of skin: Secondary | ICD-10-CM | POA: Diagnosis not present

## 2023-06-21 DIAGNOSIS — I83012 Varicose veins of right lower extremity with ulcer of calf: Secondary | ICD-10-CM | POA: Diagnosis not present

## 2023-06-21 DIAGNOSIS — I890 Lymphedema, not elsewhere classified: Secondary | ICD-10-CM

## 2023-06-21 NOTE — Progress Notes (Signed)
Office Visit Note   Patient: Evan [name]           Date of Birth: 1971/04/11           MRN: [id number] Visit Date: 06/21/2023              Requested by: Elenore Paddy, NP [address] [place],  [place] [address] PCP: Elenore Paddy, NP  Chief Complaint  Patient presents with   Right Leg - Wound Check      HPI: The patient is a 52 year old gentleman who presents in follow-up for chronic ulcers to his right lower extremity traumatic venous insufficiency ulcer has been treating with dry dressings.  Prefers Mepilex however we have been unable to get any for home delivery his insurance does not participate with [employer].  He states he is also trying to get set up with disability he may have some paperwork that he will bring by  Overall feels well.  No new concerns.  Assessment & Plan: Visit Diagnoses:  1. Venous stasis ulcer of right calf limited to breakdown of skin with varicose veins (HCC)   2. Lymphedema     Plan: Continue daily dose of cleansing.  Dry dressings.  Close monitoring of the ulcer.  Again reinforced importance of compression elevation  Follow-Up Instructions: No follow-ups on file.   Ortho Exam  Patient is alert, oriented, no adenopathy, well-dressed, normal affect, normal respiratory effort. On examination bilateral lower extremities the patient has significant venous changes of the lower extremities with hemosiderin a staining darkened discoloration.  There is trace edema.  On the right lower extremity he does have a lateral ulcer this is 2 cm in diameter filled in with granulation this is bleeding.  Touched with silver nitrate there is no surrounding erythema purulence no sign of infection Mepilex dressing applied.  Given a few for home care.  Imaging:  No results found. No images are attached to the encounter.  Labs: Lab Results  Component Value Date   HGBA1C 5.5 10/18/2022   HGBA1C 5.6 04/17/2019   ESRSEDRATE 72 (H) 05/19/2019   ESRSEDRATE  59 (H) 05/05/2019   ESRSEDRATE 75 (H) 04/17/2019   CRP 65.7 (H) 06/23/2019   CRP 61.5 (H) 06/04/2019   CRP 47.8 (H) 05/26/2019   LABURIC 5.6 03/26/2022   REPTSTATUS 04/01/2022 FINAL 03/26/2022   GRAMSTAIN  04/17/2019    RARE WBC PRESENT,BOTH PMN AND MONONUCLEAR NO ORGANISMS SEEN    CULT  03/26/2022    NO GROWTH 5 DAYS Performed at [facility], [address], [place], [place] [address]      Lab Results  Component Value Date   ALBUMIN 4.4 10/18/2022   ALBUMIN 3.8 10/17/2020   ALBUMIN 2.4 (L) 05/06/2019    No results found for: "MG" Lab Results  Component Value Date   VD25OH 32.0 04/17/2019    No results found for: "PREALBUMIN"    Latest Ref Rng & Units 10/18/2022    1:47 PM 03/28/2022    4:33 AM 03/27/2022    5:55 AM  CBC EXTENDED  WBC 4.0 - 10.5 K/uL 9.0  3.7  5.6   RBC 4.22 - 5.81 Mil/uL 4.89  3.87  3.77   Hemoglobin 13.0 - 17.0 g/dL 82.9  56.2  13.0   HCT 39.0 - 52.0 % 41.7  33.7  32.6   Platelets 150.0 - 400.0 K/uL 345.0  261  236      There is no height or weight on  file to calculate BMI.  Orders:  No orders of the defined types were placed in this encounter.  No orders of the defined types were placed in this encounter.    Procedures: No procedures performed  Clinical Data: No additional findings.  ROS:  All other systems negative, except as noted in the HPI. Review of Systems  Objective: Vital Signs: There were no vitals taken for this visit.  Specialty Comments:  No specialty comments available.  PMFS History: Patient Active Problem List   Diagnosis Date Noted   Panic disorder 10/18/2022   Class 2 obesity with body mass index (BMI) of 36.0 to 36.9 in adult 10/18/2022   Neuropathy 10/18/2022   Venous stasis ulcer of calf limited to breakdown of skin with varicose veins (HCC)    Cellulitis 03/27/2022   AKI (acute kidney injury) (HCC) 03/26/2022   Multiple open wounds of lower leg 03/26/2022   Dyspnea on exertion 03/26/2022    Great toe pain, right 03/26/2022   IV drug abuse (HCC)    Septic arthritis of knee, right (HCC) 04/17/2019   Hardware complicating wound infection (HCC) 04/17/2019   Hepatitis C virus infection cured after antiviral drug therapy 04/17/2019   Acute lateral meniscus tear of right knee 01/23/2019   Displaced fracture of right tibial tuberosity, initial encounter for closed fracture 01/16/2019   Closed bicondylar fracture of right tibial plateau 01/15/2019   Cough 10/31/2016   Alcohol abuse with intoxication, with delirium (HCC) 10/03/2016   Attention deficit disorder (ADD) in adult 10/03/2016   Depression 06/15/2014   Acute bronchitis 01/28/2014   Chronic pain syndrome 01/28/2014   Hepatitis, chronic persistent (HCC) 05/14/2013   ADD (attention deficit disorder) 05/11/2013   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   Eczema 12/30/2012   Disorder of joints of multiple sites 10/02/2012   Acute and subacute liver necrosis 04/24/2012   Metastatic melanoma 10/17/2011   Chest pain 10/17/2011   Insomnia 12/20/2008   Anxiety state 12/10/2007   ERECTILE DYSFUNCTION 12/10/2007   Essential hypertension 11/21/2007   Past Medical History:  Diagnosis Date   ADD (attention deficit disorder) 05/11/2013   ANXIETY 12/10/2007   BACK PAIN 08/03/2009   Chronic pain syndrome 01/28/2014   Depression 06/15/2014   ERECTILE DYSFUNCTION 12/10/2007   Hepatitis C 05/15/2013     "treated and cured"   HYPERTENSION 11/21/2007   INSOMNIA-SLEEP DISORDER-UNSPEC 12/20/2008   Melanoma (HCC) 2015   Lymph nodes left side , radiation   Neuropathy    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   Preventative health care 05/14/2011    Family History  Problem Relation Age of Onset   Cancer Father        melanoma on back   Cancer Other        pancreatic    Past Surgical History:  Procedure Laterality Date   EXTERNAL FIXATION LEG Right 01/16/2019   Procedure: EXTERNAL FIXATION RIGHT KNEE;  Surgeon:  Roby Lofts, MD;  Location: [facility];  Service: Orthopedics;  Laterality: Right;   HARDWARE REMOVAL Right 04/17/2019   Procedure: HARDWARE REMOVAL RIGHT TIBIA;  Surgeon: Roby Lofts, MD;  Location: [facility];  Service: Orthopedics;  Laterality: Right;   IRRIGATION AND DEBRIDEMENT KNEE Right 04/17/2019   Procedure: IRRIGATION AND DEBRIDEMENT RIGHT KNEE;  Surgeon: Roby Lofts, MD;  Location: [facility];  Service: Orthopedics;  Laterality: Right;   LYMPHADENECTOMY Left    ORIF TIBIA PLATEAU Right 01/20/2019   Procedure: OPEN REDUCTION INTERNAL FIXATION (ORIF) TIBIAL PLATEAU;  Surgeon: Roby Lofts, MD;  Location: [facility];  Service: Orthopedics;  Laterality: Right;   SHOULDER SURGERY Left    chronic L dislocating    Social History   Occupational History   Occupation: works in [employer]  Tobacco Use   Smoking status: Former    Types: Cigarettes    Quit date: 2000    Years since quitting: 24.5   Smokeless tobacco: Never   Tobacco comments:    in college  Vaping Use   Vaping Use: Never used  Substance and Sexual Activity   Alcohol use: Not Currently   Drug use: No   Sexual activity: Not on file

## 2023-07-04 ENCOUNTER — Encounter (HOSPITAL_COMMUNITY): Payer: Self-pay

## 2023-07-04 ENCOUNTER — Ambulatory Visit (HOSPITAL_COMMUNITY)
Admission: RE | Admit: 2023-07-04 | Discharge: 2023-07-04 | Disposition: A | Discharge status: Home / Self Care | Payer: MEDICAID | Source: Ambulatory Visit | Attending: Internal Medicine | Admitting: Internal Medicine

## 2023-07-04 VITALS — BP 123/85 | HR 80 | Temp 97.8°F | Resp 18 | Ht 75.0 in | Wt 295.4 lb

## 2023-07-04 DIAGNOSIS — R051 Acute cough: Secondary | ICD-10-CM | POA: Diagnosis not present

## 2023-07-04 DIAGNOSIS — R197 Diarrhea, unspecified: Secondary | ICD-10-CM

## 2023-07-04 MED ORDER — GUAIFENESIN ER 600 MG PO TB12: 600.0000 mg | ORAL_TABLET | Freq: Two times a day (BID) | ORAL | 0 refills | Status: DC

## 2023-07-04 MED ORDER — BENZONATATE 100 MG PO CAPS: 100.0000 mg | ORAL_CAPSULE | Freq: Three times a day (TID) | ORAL | 0 refills | Status: DC | PRN

## 2023-07-04 NOTE — ED Triage Notes (Signed)
Patient having a deep dry cough with slight production onset last Tuesday.  Patient went to his parents for 7/4, and woke up with diarrhea all over him. Tried imodium with no relief. States having accidents as he can not tell when it is coming. Will happen 2-3 times daily.

## 2023-07-04 NOTE — Discharge Instructions (Signed)
Please maintain adequate hydration Please take medications as prescribed Will call you with recommendations if labs are abnormal Will send your stool sample for stool studies Please return to urgent care if you have worsening symptoms.

## 2023-07-05 ENCOUNTER — Ambulatory Visit: Payer: Medicaid Other | Admitting: Family Medicine

## 2023-07-06 NOTE — ED Provider Notes (Signed)
[facility]    CSN: [id number] Arrival date & time: 07/04/23  1000      History   Chief Complaint Chief Complaint  Patient presents with   Appointment   Cough   Diarrhea    HPI Evan [name] is a 52 y.o. male comes to the urgent care with cough which is minimally productive.  Cough started a couple of days ago and has been persistent.  He denies any shortness of breath or wheezing.  No sick contacts.  No runny nose.  Patient produces sputum minimally but is not discolored.  He denies any chest pain or chest pressure.  No long distance travel.  No calf pain.  No runny nose sneezing or sore throat.  Patient denies any chest pain or chest pressure.  Patient is complaining of watery stools which started about a week ago.  Onset was fairly abrupt.  He has no blood in the stool.  Is not associated with abdominal cramps, distention, nausea or vomiting.  No change in his dietary habits.  No recent travel.  No sick contacts.  No abdominal distention or bloating.  No yellowing of his eyes.  Patient denies any weight loss.  Symptoms as visited with decreased oral intake.  Patient has been taking Imodium with partial relief.  He has had 2-3 bowel movements on average in a day.  Stool is watery.  Today the patient has had 1 bowel movement.  Sometimes the bowel movements are uncontrollable and he does not get the urge that he is going to have a bowel movement.  No back pain.  No numbness in the perineal region. 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    Preventative health care 05/14/2011    Patient Active Problem List   Diagnosis Date Noted   Panic disorder 10/18/2022   Class 2 obesity with body mass index (BMI) of 36.0 to 36.9 in adult 10/18/2022   Neuropathy 10/18/2022   Venous stasis ulcer of calf limited to breakdown of skin with varicose veins (HCC)    Cellulitis 03/27/2022   AKI (acute kidney injury) (HCC) 03/26/2022   Multiple open wounds of lower leg 03/26/2022   Dyspnea on exertion 03/26/2022   Great toe pain, right 03/26/2022   IV drug abuse (HCC)    Septic arthritis of knee, right (HCC) 04/17/2019   Hardware complicating wound infection (HCC) 04/17/2019   Hepatitis C virus infection cured after antiviral drug therapy 04/17/2019   Acute lateral meniscus tear of right knee 01/23/2019   Displaced fracture of right tibial tuberosity, initial encounter for closed fracture 01/16/2019   Closed bicondylar fracture of right tibial plateau 01/15/2019   Cough 10/31/2016   Alcohol abuse with intoxication, with delirium (HCC) 10/03/2016   Attention deficit disorder (ADD) in adult 10/03/2016   Depression 06/15/2014   Acute bronchitis 01/28/2014   Chronic pain syndrome 01/28/2014   Hepatitis, chronic persistent (HCC) 05/14/2013   ADD (attention deficit disorder) 05/11/2013   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   Eczema 12/30/2012   Disorder of joints  of multiple sites 10/02/2012   Acute and subacute liver necrosis 04/24/2012   Metastatic melanoma 10/17/2011   Chest pain 10/17/2011   Insomnia 12/20/2008   Anxiety state 12/10/2007   ERECTILE DYSFUNCTION 12/10/2007   Essential hypertension 11/21/2007    Past Surgical History:  Procedure Laterality Date   EXTERNAL FIXATION LEG Right 01/16/2019   Procedure: EXTERNAL FIXATION RIGHT KNEE;  Surgeon: Roby Lofts, MD;  Location: [facility];  Service: Orthopedics;  Laterality: Right;   HARDWARE REMOVAL Right 04/17/2019   Procedure: HARDWARE REMOVAL RIGHT TIBIA;  Surgeon:  Roby Lofts, MD;  Location: [facility];  Service: Orthopedics;  Laterality: Right;   IRRIGATION AND DEBRIDEMENT KNEE Right 04/17/2019   Procedure: IRRIGATION AND DEBRIDEMENT RIGHT KNEE;  Surgeon: Roby Lofts, MD;  Location: [facility];  Service: Orthopedics;  Laterality: Right;   LYMPHADENECTOMY Left    ORIF TIBIA PLATEAU Right 01/20/2019   Procedure: OPEN REDUCTION INTERNAL FIXATION (ORIF) TIBIAL PLATEAU;  Surgeon: Roby Lofts, MD;  Location: [facility];  Service: Orthopedics;  Laterality: Right;   SHOULDER SURGERY Left    chronic L dislocating        Home Medications    Prior to Admission medications   Medication Sig Start Date End Date Taking? Authorizing Provider  ALPRAZolam Prudy Feeler) 0.5 MG tablet Take 1 mg by mouth 3 (three) times daily as needed. 03/30/22  Yes [provider]  amphetamine-dextroamphetamine (ADDERALL) 20 MG tablet Take 20 mg by mouth 3 (three) times daily.   Yes [provider]  atenolol (TENORMIN) 25 MG tablet Take 1 tablet (25 mg total) by mouth daily. 10/18/22  Yes Elenore Paddy, NP  benzonatate (TESSALON) 100 MG capsule Take 1 capsule (100 mg total) by mouth 3 (three) times daily as needed for cough. 07/04/23  Yes Rajan Burgard, Britta Mccreedy, MD  DULoxetine (CYMBALTA) 60 MG capsule Take 60 mg by mouth daily.   Yes [provider]  gabapentin (NEURONTIN) 100 MG capsule Take 1 capsule (100 mg total) by mouth 2 (two) times daily as needed (pain). 10/18/22  Yes Elenore Paddy, NP  guaiFENesin (MUCINEX) 600 MG 12 hr tablet Take 1 tablet (600 mg total) by mouth 2 (two) times daily. 07/04/23  Yes Rad Gramling, Britta Mccreedy, MD  ibuprofen (ADVIL) 200 MG tablet Take 800-1,000 mg by mouth every 4 (four) hours as needed for headache or moderate pain.   Yes [provider]  losartan (COZAAR) 50 MG tablet Take 1 tablet (50 mg total) by mouth daily. 10/18/22  Yes Elenore Paddy, NP  acetaminophen (TYLENOL) 500 MG tablet Take 1 tablet (500 mg total) by mouth every 12  (twelve) hours. 05/08/19   [place], PA-C    Family History Family History  Problem Relation Age of Onset   Cancer Father        melanoma on back   Cancer Other        pancreatic    Social History Social History   Tobacco Use   Smoking status: Former    Current packs/day: 0.00    Types: Cigarettes    Quit date: 2000    Years since quitting: 24.5   Smokeless tobacco: Never   Tobacco comments:    in college  Vaping Use   Vaping status: Never Used  Substance Use Topics   Alcohol use: Not Currently   Drug use: No     Allergies   Contrast media [iodinated contrast media] and Fish allergy   Review of Systems  Review of Systems As per HPI  Physical Exam Triage Vital Signs ED Triage Vitals  Encounter Vitals Group     BP 07/04/23 1026 123/85     Systolic BP Percentile --      Diastolic BP Percentile --      Pulse Rate 07/04/23 1026 80     Resp 07/04/23 1026 18     Temp 07/04/23 1026 97.8 F (36.6 C)     Temp Source 07/04/23 1026 Oral     SpO2 07/04/23 1026 97 %     Weight 07/04/23 1026 295 lb 6.4 oz (134 kg)     Height 07/04/23 1026 6\' 3"  (1.905 m)     Head Circumference --      Peak Flow --      Pain Score 07/04/23 1023 0     Pain Loc --      Pain Education --      Exclude from Growth Chart --    No data found.  Updated Vital Signs BP 123/85 (BP Location: Left Arm)   Pulse 80   Temp 97.8 F (36.6 C) (Oral)   Resp 18   Ht 6\' 3"  (1.905 m)   Wt 134 kg   SpO2 97%   BMI 36.92 kg/m   Visual Acuity Right Eye Distance:   Left Eye Distance:   Bilateral Distance:    Right Eye Near:   Left Eye Near:    Bilateral Near:     Physical Exam Vitals and nursing note reviewed.  Constitutional:      General: He is not in acute distress.    Appearance: Normal appearance. He is not ill-appearing.  Cardiovascular:     Rate and Rhythm: Normal rate and regular rhythm.     Pulses: Normal pulses.     Heart sounds: Normal heart sounds.  Pulmonary:      Effort: Pulmonary effort is normal.     Breath sounds: Normal breath sounds.  Abdominal:     General: Bowel sounds are normal.     Palpations: Abdomen is soft.  Neurological:     Mental Status: He is alert.      UC Treatments / Results  Labs (all labs ordered are listed, but only abnormal results are displayed) Labs Reviewed  GASTROINTESTINAL PANEL BY PCR, STOOL (REPLACES STOOL CULTURE)    EKG   Radiology No results found.  Procedures Procedures (including critical care time)  Medications Ordered in UC Medications - No data to display  Initial Impression / Assessment and Plan / UC Course  I have reviewed the triage vital signs and the nursing notes.  Pertinent labs & imaging results that were available during my care of the patient were reviewed by me and considered in my medical decision making (see chart for details).     1.  Acute cough likely viral respiratory illness: No indication for COVID-19 testing Tessalon Perles as needed for cough Mucinex as needed for sputum production  2.  Acute diarrhea: GI panel Patient advised to increase oral fluid intake Patient is well-hydrated and remains hemodynamically stable Will call patient with recommendations if labs are abnormal Return precautions given.  Final Clinical Impressions(s) / UC Diagnoses   Final diagnoses:  Acute cough  Diarrhea of presumed infectious origin     Discharge Instructions      Please maintain adequate hydration Please take medications as prescribed Will call you with recommendations if labs are abnormal Will send your stool sample for stool studies Please return  to urgent care if you have worsening symptoms.   ED Prescriptions     Medication Sig Dispense Auth. Provider   benzonatate (TESSALON) 100 MG capsule Take 1 capsule (100 mg total) by mouth 3 (three) times daily as needed for cough. 21 capsule Cyrah Mclamb, Britta Mccreedy, MD   guaiFENesin (MUCINEX) 600 MG 12 hr tablet Take 1  tablet (600 mg total) by mouth 2 (two) times daily. 20 tablet Liliann File, Britta Mccreedy, MD      PDMP not reviewed this encounter.   Merrilee Jansky, MD 07/06/23 [phone]

## 2023-07-10 ENCOUNTER — Other Ambulatory Visit: Payer: Self-pay | Admitting: Nurse Practitioner

## 2023-07-10 DIAGNOSIS — I10 Essential (primary) hypertension: Secondary | ICD-10-CM

## 2023-07-16 ENCOUNTER — Telehealth: Payer: Self-pay | Admitting: Medical Oncology

## 2023-07-16 NOTE — Telephone Encounter (Signed)
PET r/s to 8/06. When does Evan [name] want to see me?

## 2023-07-17 ENCOUNTER — Telehealth: Payer: Self-pay | Admitting: Physician Assistant

## 2023-07-17 NOTE — Telephone Encounter (Signed)
Scheduled per 07/23 los, patient has been called and notified.

## 2023-07-30 ENCOUNTER — Encounter (HOSPITAL_COMMUNITY): Payer: MEDICAID

## 2023-08-03 NOTE — Progress Notes (Deleted)
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  He developed stage III in 2012 with right axillary lymph node involvement and 1 out of 26 lymph nodes.   PRIOR THERAPY: 1) He is status post biopsy biopsy at that time showed a superficial spreading melanoma with ulceration measuring 1.25 mm.    2) He underwent wide excision with the final staging was T2BN0 stage IIA.    3) He developed axillary melanoma recurrence in 2012.  He underwent lymph node dissection and found to have 1 out of 26 lymph node that was positive with the largest node measuring 4.8 cm.    4) He received radiation therapy between November and December 2012 to the right axilla.    5) He subsequently treated with adjuvant ipilimumab up till February 2013.  Therapy was discontinued related to cellulitis in his hand.  She did develop acute hepatitis which could be related to ipilimumab in 2013.    6) He continues to be on active surveillance under the care of Dr. Dan Europe [facility] ) till 2017 without any evidence of relapsed disease.     CURRENT THERAPY: Observation   INTERVAL HISTORY: Evan [name] 52 y.o. male returns returns to the clinic today for a follow-up visit.  The patient was last seen by Dr. Arbutus Ped and myself on ***. Since last being seen the patient denies any major changes in his health. He continues having on healing ulcer on his right ankle for which he sees ***.    ***his last PET showed The scan showed similar appearance of the left axillary and left posterior triangle hypermetabolic lymph nodes. There is also focal hypermetabolism in the Param midline chest superficial to the sternum which may be subcutaneous tissue or tiny subcutaneous gas bubble which may be infectious and inflammatory which we will monitor on future studies. There is some focal  hypermetabolism which may be physiologic in the small bowel we will monitor closely***    He is followed by orthopedic surgery and vascular for nonhealing lower extremity wounds.  He is scheduled to see them again on  ***  Otherwise the patient denies any fever, chills, night sweats, or unexplained weight loss.  He reports he has actually gained weight this year due to inactivity after having knee surgery.     Denies any chest pain, shortness of breath, cough, or hemoptysis.  Denies any nausea, vomiting, diarrhea, or constipation.  Denies any lymphadenopathy.  Denies any headache or visual changes.  The patient is here today to review the results of his PET scan and for more detailed discussion about his current condition and recommended treatment options.  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  MEDICATIONS:  Current Outpatient Medications  Medication Sig Dispense Refill   acetaminophen (TYLENOL)  500 MG tablet Take 1 tablet (500 mg total) by mouth every 12 (twelve) hours. 60 tablet 1   ALPRAZolam (XANAX) 0.5 MG tablet Take 1 mg by mouth 3 (three) times daily as needed.     amphetamine-dextroamphetamine (ADDERALL) 20 MG tablet Take 20 mg by mouth 3 (three) times daily.     atenolol (TENORMIN) 25 MG tablet Take 1 tablet (25 mg total) by mouth daily. 90 tablet 1   benzonatate (TESSALON) 100 MG capsule Take 1 capsule (100 mg total)  by mouth 3 (three) times daily as needed for cough. 21 capsule 0   DULoxetine (CYMBALTA) 60 MG capsule Take 60 mg by mouth daily.     gabapentin (NEURONTIN) 100 MG capsule Take 1 capsule (100 mg total) by mouth 2 (two) times daily as needed (pain). 60 capsule 2   guaiFENesin (MUCINEX) 600 MG 12 hr tablet Take 1 tablet (600 mg total) by mouth 2 (two) times daily. 20 tablet 0   ibuprofen (ADVIL) 200 MG tablet Take 800-1,000 mg by mouth every 4 (four) hours as needed for headache or moderate pain.     losartan (COZAAR) 50 MG tablet Take 1 tablet (50 mg total) by mouth daily. 90 tablet 1   No current facility-administered medications for this visit.    SURGICAL HISTORY:  Past Surgical History:  Procedure Laterality Date   EXTERNAL FIXATION LEG Right 01/16/2019   Procedure: EXTERNAL FIXATION RIGHT KNEE;  Surgeon: Roby Lofts, MD;  Location: [facility];  Service: Orthopedics;  Laterality: Right;   HARDWARE REMOVAL Right 04/17/2019   Procedure: HARDWARE REMOVAL RIGHT TIBIA;  Surgeon: Roby Lofts, MD;  Location: [facility];  Service: Orthopedics;  Laterality: Right;   IRRIGATION AND DEBRIDEMENT KNEE Right 04/17/2019   Procedure: IRRIGATION AND DEBRIDEMENT RIGHT KNEE;  Surgeon: Roby Lofts, MD;  Location: [facility];  Service: Orthopedics;  Laterality: Right;   LYMPHADENECTOMY Left    ORIF TIBIA PLATEAU Right 01/20/2019   Procedure: OPEN REDUCTION INTERNAL FIXATION (ORIF) TIBIAL PLATEAU;  Surgeon: Roby Lofts, MD;  Location: [facility];  Service: Orthopedics;  Laterality: Right;   SHOULDER SURGERY Left    chronic L dislocating     REVIEW OF SYSTEMS:   Review of Systems  Constitutional: Negative for appetite change, chills, fatigue, fever and unexpected weight change.  HENT:   Negative for mouth sores, nosebleeds, sore throat and trouble swallowing.   Eyes: Negative for eye problems and icterus.  Respiratory: Negative for cough, hemoptysis, shortness of breath and wheezing.   Cardiovascular: Negative  for chest pain and leg swelling.  Gastrointestinal: Negative for abdominal pain, constipation, diarrhea, nausea and vomiting.  Genitourinary: Negative for bladder incontinence, difficulty urinating, dysuria, frequency and hematuria.   Musculoskeletal: Negative for back pain, gait problem, neck pain and neck stiffness.  Skin: Negative for itching and rash.  Neurological: Negative for dizziness, extremity weakness, gait problem, headaches, light-headedness and seizures.  Hematological: Negative for adenopathy. Does not bruise/bleed easily.  Psychiatric/Behavioral: Negative for confusion, depression and sleep disturbance. The patient is not nervous/anxious.     PHYSICAL EXAMINATION:  There were no vitals taken for this visit.  ECOG PERFORMANCE STATUS: {CHL ONC ECOG [id number]  Physical Exam  Constitutional: Oriented to person, place, and time and well-developed, well-nourished, and in no distress. No distress.  HENT:  Head: Normocephalic and atraumatic.  Mouth/Throat: Oropharynx is clear and moist. No oropharyngeal exudate.  Eyes: Conjunctivae are normal. Right eye exhibits no discharge. Left eye exhibits no discharge. No scleral icterus.  Neck: Normal range of motion. Neck supple.  Cardiovascular: Normal rate, regular rhythm, normal heart sounds and intact distal pulses.   Pulmonary/Chest: Effort normal and breath sounds normal. No respiratory distress. No wheezes. No rales.  Abdominal: Soft. Bowel sounds are normal. Exhibits no distension and no mass. There is no tenderness.  Musculoskeletal: Normal range of motion. Exhibits no edema.  Lymphadenopathy:    No cervical adenopathy.  Neurological: Alert and oriented to person, place, and time. Exhibits normal muscle tone. Gait normal. Coordination normal.  Skin: Skin is warm and dry. No rash noted. Not diaphoretic. No erythema. No pallor.  Psychiatric: Mood, memory and judgment normal.  Vitals reviewed.  LABORATORY DATA: Lab Results   Component Value Date   WBC 9.0 10/18/2022   HGB 13.6 10/18/2022   HCT 41.7 10/18/2022   MCV 85.2 10/18/2022   PLT 345.0 10/18/2022      Chemistry      Component Value Date/Time   NA 137 10/18/2022 1347   K 4.5 10/18/2022 1347   CL 101 10/18/2022 1347   CO2 26 10/18/2022 1347   BUN 25 (H) 10/18/2022 1347   CREATININE 1.41 10/18/2022 1347   CREATININE 0.92 06/23/2019 1543      Component Value Date/Time   CALCIUM 9.9 10/18/2022 1347   ALKPHOS 92 10/18/2022 1347   AST 16 10/18/2022 1347   ALT 9 10/18/2022 1347   BILITOT 0.2 10/18/2022 1347       RADIOGRAPHIC STUDIES:  No results found.   ASSESSMENT/PLAN:  No problem-specific Assessment & Plan notes found for this encounter.   No orders of the defined types were placed in this encounter.    I spent {CHL ONC TIME VISIT - [id number]} counseling the patient face to face. The total time spent in the appointment was {CHL ONC TIME VISIT - XBMWU:[id number]}.   L , PA-C 08/03/23

## 2023-08-05 ENCOUNTER — Telehealth: Payer: Self-pay | Admitting: Physician Assistant

## 2023-08-05 NOTE — Telephone Encounter (Signed)
The patient was scheduled to see me tomorrow but his PET scan is not scheduled until 08/15/2023.  Therefore we have canceled his appointment for tomorrow and reschedule this for 8/29.  I called the patient to let him know this.  He confirmed a new appointment time.

## 2023-08-06 ENCOUNTER — Ambulatory Visit: Payer: PRIVATE HEALTH INSURANCE | Admitting: Physician Assistant

## 2023-08-09 ENCOUNTER — Ambulatory Visit: Payer: Medicaid Other | Admitting: Family

## 2023-08-15 ENCOUNTER — Encounter (HOSPITAL_COMMUNITY)
Admission: RE | Admit: 2023-08-15 | Discharge: 2023-08-15 | Disposition: A | Discharge status: Home / Self Care | Payer: MEDICAID | Source: Ambulatory Visit | Attending: Physician Assistant | Admitting: Physician Assistant

## 2023-08-15 DIAGNOSIS — C439 Malignant melanoma of skin, unspecified: Secondary | ICD-10-CM | POA: Diagnosis present

## 2023-08-15 LAB — GLUCOSE, CAPILLARY: Glucose-Capillary: 84 mg/dL (ref 70–99)

## 2023-08-15 MED ORDER — FLUDEOXYGLUCOSE F - 18 (FDG) INJECTION: 14.8000 | Freq: Once | INTRAVENOUS | Status: DC

## 2023-08-20 NOTE — Progress Notes (Deleted)
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  He developed stage III in 2012 with right axillary lymph node involvement and 1 out of 26 lymph nodes.   PRIOR THERAPY: 1) He is status post biopsy biopsy at that time showed a superficial spreading melanoma with ulceration measuring 1.25 mm.    2) He underwent wide excision with the final staging was T2BN0 stage IIA.    3) He developed axillary melanoma recurrence in 2012.  He underwent lymph node dissection and found to have 1 out of 26 lymph node that was positive with the largest node measuring 4.8 cm.    4) He received radiation therapy between November and December 2012 to the right axilla.    5) He subsequently treated with adjuvant ipilimumab up till February 2013.  Therapy was discontinued related to cellulitis in his hand.  She did develop acute hepatitis which could be related to ipilimumab in 2013.    6) He continues to be on active surveillance under the care of Dr. Dan Europe [facility] ) till 2017 without any evidence of relapsed disease.     CURRENT THERAPY: Observation   INTERVAL HISTORY: Evan [name] 52 y.o. male returns returns to the clinic today for a follow-up visit.  The patient was last seen by Dr. Arbutus Ped and myself on ***. Since last being seen the patient denies any major changes in his health. He continues having on healing ulcer on his right ankle for which he sees ***.    ***his last PET showed The scan showed similar appearance of the left axillary and left posterior triangle hypermetabolic lymph nodes. There is also focal hypermetabolism in the Param midline chest superficial to the sternum which may be subcutaneous tissue or tiny subcutaneous gas bubble which may be infectious and inflammatory which we will monitor on future studies. There is some focal  hypermetabolism which may be physiologic in the small bowel we will monitor closely***   ***did you establish with dermatology***  ***supposed to follow up in June***    He is followed by orthopedic surgery and vascular for nonhealing lower extremity wounds.  He is scheduled to see them again on  ***  Otherwise the patient denies any fever, chills, night sweats, or unexplained weight loss.  He reports he has actually gained weight this year due to inactivity after having knee surgery.     Denies any chest pain, shortness of breath, cough, or hemoptysis.  Denies any nausea, vomiting, diarrhea, or constipation.  Denies any lymphadenopathy.  Denies any headache or visual changes.  The patient is here today to review the results of his PET scan and for more detailed discussion about his current condition and recommended treatment options.  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  MEDICATIONS:  Current Outpatient Medications  Medication Sig Dispense Refill   acetaminophen (TYLENOL) 500 MG tablet Take 1 tablet (500 mg total) by mouth every 12 (twelve) hours. 60 tablet 1   ALPRAZolam (XANAX) 0.5 MG tablet Take 1 mg by mouth 3 (three) times daily as needed.     amphetamine-dextroamphetamine (ADDERALL) 20 MG tablet Take 20 mg by mouth 3 (three) times daily.     atenolol (TENORMIN) 25 MG tablet Take 1 tablet (25 mg total) by mouth  daily. 90 tablet 1   benzonatate (TESSALON) 100 MG capsule Take 1 capsule (100 mg total) by mouth 3 (three) times daily as needed for cough. 21 capsule 0   DULoxetine (CYMBALTA) 60 MG capsule Take 60 mg by mouth daily.     gabapentin (NEURONTIN) 100 MG capsule Take 1 capsule (100 mg total) by mouth 2 (two) times daily as needed (pain). 60 capsule 2   guaiFENesin (MUCINEX) 600 MG 12 hr tablet Take 1 tablet (600 mg total) by mouth 2 (two) times daily. 20 tablet 0   ibuprofen (ADVIL) 200 MG tablet Take 800-1,000 mg by mouth every 4 (four) hours as needed for headache or moderate pain.     losartan (COZAAR) 50 MG tablet Take 1 tablet (50 mg total) by mouth daily. 90 tablet 1   No current facility-administered medications for this visit.   Facility-Administered Medications Ordered in Other Visits  Medication Dose Route Frequency Provider Last Rate Last Admin   fludeoxyglucose F - 18 (FDG) injection 14.8 millicurie  14.8 millicurie Intravenous Once Myles Rosenthal, MD        SURGICAL HISTORY:  Past Surgical History:  Procedure Laterality Date   EXTERNAL FIXATION LEG Right 01/16/2019   Procedure: EXTERNAL FIXATION RIGHT KNEE;  Surgeon: Roby Lofts, MD;  Location: [facility] OR;  Service: Orthopedics;  Laterality: Right;   HARDWARE REMOVAL Right 04/17/2019   Procedure: HARDWARE REMOVAL RIGHT TIBIA;  Surgeon: Roby Lofts, MD;  Location: [facility] OR;  Service: Orthopedics;  Laterality: Right;   IRRIGATION AND DEBRIDEMENT KNEE Right 04/17/2019   Procedure: IRRIGATION AND DEBRIDEMENT RIGHT KNEE;  Surgeon: Roby Lofts, MD;  Location: [facility] OR;  Service: Orthopedics;  Laterality: Right;   LYMPHADENECTOMY Left    ORIF TIBIA PLATEAU Right 01/20/2019   Procedure: OPEN REDUCTION INTERNAL FIXATION (ORIF) TIBIAL PLATEAU;  Surgeon: Roby Lofts, MD;  Location: [facility] OR;  Service: Orthopedics;  Laterality: Right;   SHOULDER SURGERY Left    chronic L dislocating     REVIEW OF SYSTEMS:   Review of Systems   Constitutional: Negative for appetite change, chills, fatigue, fever and unexpected weight change.  HENT:   Negative for mouth sores, nosebleeds, sore throat and trouble swallowing.   Eyes: Negative for eye problems and icterus.  Respiratory: Negative for cough, hemoptysis, shortness of breath and wheezing.   Cardiovascular: Negative for chest pain and leg swelling.  Gastrointestinal: Negative for abdominal pain, constipation, diarrhea, nausea and vomiting.  Genitourinary: Negative for bladder incontinence, difficulty urinating, dysuria, frequency and hematuria.   Musculoskeletal: Negative for back pain, gait problem, neck pain and neck stiffness.  Skin: Negative for itching and rash.  Neurological: Negative for dizziness, extremity weakness, gait problem, headaches, light-headedness and seizures.  Hematological: Negative for adenopathy. Does not bruise/bleed easily.  Psychiatric/Behavioral: Negative for confusion, depression and sleep disturbance. The patient is not nervous/anxious.     PHYSICAL EXAMINATION:  There were no vitals taken for this visit.  ECOG PERFORMANCE STATUS: {CHL ONC ECOG VW:[id number]}  Physical Exam  Constitutional: Oriented to person, place, and time and well-developed, well-nourished, and in no distress. No distress.  HENT:  Head: Normocephalic and atraumatic.  Mouth/Throat: Oropharynx is clear and moist. No oropharyngeal exudate.  Eyes: Conjunctivae are normal. Right eye exhibits no discharge. Left eye exhibits no discharge. No scleral icterus.  Neck: Normal range of motion. Neck supple.  Cardiovascular: Normal rate, regular rhythm, normal heart sounds and intact distal pulses.   Pulmonary/Chest: Effort normal and breath sounds normal. No respiratory distress. No wheezes. No rales.  Abdominal: Soft. Bowel sounds are normal. Exhibits no distension and no mass. There is no tenderness.  Musculoskeletal: Normal range of motion. Exhibits no edema.  Lymphadenopathy:     No cervical adenopathy.  Neurological: Alert and oriented to person, place, and time. Exhibits normal muscle tone. Gait normal. Coordination normal.  Skin: Skin is warm and dry. No rash noted. Not diaphoretic. No erythema. No pallor.  Psychiatric: Mood, memory and judgment normal.  Vitals reviewed.  LABORATORY DATA: Lab Results  Component Value Date   WBC 9.0 10/18/2022   HGB 13.6 10/18/2022   HCT 41.7 10/18/2022   MCV 85.2 10/18/2022   PLT 345.0 10/18/2022      Chemistry      Component Value Date/Time   NA 137 10/18/2022 1347   K 4.5 10/18/2022 1347   CL 101 10/18/2022 1347   CO2 26 10/18/2022 1347   BUN 25 (H) 10/18/2022 1347   CREATININE 1.41 10/18/2022 1347   CREATININE 0.92 06/23/2019 1543      Component Value Date/Time   CALCIUM 9.9 10/18/2022 1347   ALKPHOS 92 10/18/2022 1347   AST 16 10/18/2022 1347   ALT 9 10/18/2022 1347   BILITOT 0.2 10/18/2022 1347       RADIOGRAPHIC STUDIES:  No results found.   ASSESSMENT/PLAN:  This is a very pleasant 52 year old Caucasian male with history of melanoma.  The patient was diagnosed with abdominal wall melanoma stage IIa in 2009 status post excision.  He then developed stage III disease with axillary lymph node involvement in 2012 for which he underwent treatment with ipilimumab developed cellulitis and hepatitis.  He has been on surveillance   The patient had a PET scan performed in September 2023 that showed left axillary and pelvic adenopathy which could be reactive but metastatic lymphadenopathy cannot be ruled out.  Therefore, he had a repeat PET scan performed recently.   The patient was seen with Dr. Arbutus Ped today.  Dr. Arbutus Ped personally independently reviewed the PET scan discussed the results with the patient today.  The scan showed ***    Dr. Arbutus Ped recommends the patient continue on observation with a repeat PET scan in 4 months. We will monitor the incidental focal areas on subsequent imaging studies.    ***seeing derm***  Per Dr. Alver Fisher last note, if he is found to have metastatic melanoma then Dr. Clelia Croft recommended restarting treatment with combination ipilimumab and nivolumab or BRAF targeted therapy.   The patient was advised to call immediately if she has any concerning symptoms in the interval. The patient voices understanding of current disease status and treatment options and is in agreement with the current care plan. All questions were answered. The patient knows to call the clinic with any problems, questions or concerns. We can certainly see the patient much sooner if necessary         No orders of the defined types were placed in this encounter.    I spent {CHL ONC  TIME VISIT - AOZHY:[id number]} counseling the patient face to face. The total time spent in the appointment was {CHL ONC TIME VISIT - XBMWU:[id number]}.  Evan Eastridge L Koralyn Prestage, PA-C 08/20/23

## 2023-08-22 ENCOUNTER — Inpatient Hospital Stay: Payer: MEDICAID | Attending: Internal Medicine | Admitting: Physician Assistant

## 2023-08-23 ENCOUNTER — Telehealth: Payer: Self-pay | Admitting: Physician Assistant

## 2023-08-23 NOTE — Telephone Encounter (Signed)
Called patient regarding September appointments, I was unable to leave a voicemail due to voicemail being full.

## 2023-08-28 ENCOUNTER — Inpatient Hospital Stay: Payer: MEDICAID | Attending: Internal Medicine | Admitting: Physician Assistant

## 2023-08-28 VITALS — BP 149/104 | HR 102 | Temp 97.9°F | Resp 17 | Wt 261.3 lb

## 2023-08-28 DIAGNOSIS — C4359 Malignant melanoma of other part of trunk: Secondary | ICD-10-CM | POA: Insufficient documentation

## 2023-08-28 DIAGNOSIS — L089 Local infection of the skin and subcutaneous tissue, unspecified: Secondary | ICD-10-CM | POA: Diagnosis not present

## 2023-08-28 DIAGNOSIS — C439 Malignant melanoma of skin, unspecified: Secondary | ICD-10-CM

## 2023-08-28 DIAGNOSIS — S81809A Unspecified open wound, unspecified lower leg, initial encounter: Secondary | ICD-10-CM

## 2023-08-28 DIAGNOSIS — C773 Secondary and unspecified malignant neoplasm of axilla and upper limb lymph nodes: Secondary | ICD-10-CM | POA: Diagnosis present

## 2023-08-28 MED ORDER — CEPHALEXIN 500 MG PO CAPS: 500.0000 mg | ORAL_CAPSULE | Freq: Four times a day (QID) | ORAL | 0 refills | Status: DC

## 2023-08-28 NOTE — Progress Notes (Signed)
[facility] OFFICE PROGRESS NOTE  Elenore Paddy, NP [address] 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  He developed stage III in 2012 with right axillary lymph node involvement and 1 out of 26 lymph nodes.   PRIOR THERAPY: 1) He is status post biopsy biopsy at that time showed a superficial spreading melanoma with ulceration measuring 1.25 mm.    2) He underwent wide excision with the final staging was T2BN0 stage IIA.    3) He developed axillary melanoma recurrence in 2012.  He underwent lymph node dissection and found to have 1 out of 26 lymph node that was positive with the largest node measuring 4.8 cm.    4) He received radiation therapy between November and December 2012 to the right axilla.    5) He subsequently treated with adjuvant ipilimumab up till February 2013.  Therapy was discontinued related to cellulitis in his hand.  She did develop acute hepatitis which could be related to ipilimumab in 2013.    6) He continues to be on active surveillance under the care of Dr. Dan Europe [facility] ) till 2017 without any evidence of relapsed disease.     CURRENT THERAPY: Observation   INTERVAL HISTORY: Nocholas Ouellette 52 y.o. male returns to the clinic today for a follow-up visit.  The patient was last seen by Dr. Arbutus Ped and myself on 02/06/23.  The patient continues to struggle with ongoing wounds.  The largest is on the right lower extremity.  It has been there for approximately 3 years the patient ran into a nightstand.  He has not seen a dedicated wound clinic.  His orthopedic provider has been applying a Band-Aid to his wound. Given his history of melanoma, the patient does not have a dermatologist.  He is struggled with financial constraints and transportation constraints.  I have tried to call the patient on several occasions since he was last seen and his voicemail is typically full.  I  notified the patient of this today and encouraged him to empty his voicemail and to monitor for calls.  I had referred him to dermatology at his last appointment and it appears that the referral was closed.  The patient recently obtained Medicaid.  The patient states he has chronic right upper extremity swelling from where he had developed node dissection in the past.  He does not have any right open wounds.  He may have a few scattered mild/minimally erythematous bumps on his upper extremities.   His last PET showed The scan showed similar appearance of the left axillary and left posterior triangle hypermetabolic lymph nodes. There is also focal hypermetabolism in the midline chest superficial to the sternum which may be subcutaneous tissue or tiny subcutaneous gas bubble which may be infectious and inflammatory which we will monitor on future studies.    Otherwise the patient denies any fever, chills, night sweats, or unexplained weight loss.  He is trying to actively lose weight through diet and exercise.  He lost approximately 30 pounds since last being seen.  Denies any chest pain, shortness of breath, cough, or hemoptysis.  Denies any nausea, vomiting, diarrhea, or constipation.  He mentions that a few weeks ago he went to an urgent care for diarrhea and use Imodium which resolved his diarrhea.  Denies any headache or visual changes.  He mentions he ran out of his blood pressure medication.  He has  not seen his PCP in some time.  It does not sound like the patient has called for refill.  The patient is here today to review the results of his PET scan and for a more detailed discussion about his current condition and recommended treatment options.    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  MEDICATIONS:  Current Outpatient Medications  Medication Sig Dispense Refill   cephALEXin (KEFLEX) 500 MG capsule Take 1 capsule (500 mg total) by mouth 4 (four) times daily. 28 capsule 0   acetaminophen (TYLENOL) 500 MG tablet Take 1 tablet (500 mg total) by mouth every 12 (twelve) hours. 60 tablet 1   ALPRAZolam (XANAX) 0.5 MG tablet Take 1 mg by mouth 3 (three) times daily as needed.     amphetamine-dextroamphetamine (ADDERALL) 20 MG tablet Take 20 mg by mouth 3 (three) times daily.     atenolol (TENORMIN) 25 MG tablet Take 1 tablet (25 mg total) by mouth daily. 90 tablet 1   benzonatate (TESSALON) 100 MG capsule Take 1 capsule (100 mg total) by mouth 3 (three) times daily as needed for cough. 21 capsule 0   DULoxetine (CYMBALTA) 60 MG capsule Take 60 mg by mouth daily.     gabapentin (NEURONTIN) 100 MG capsule Take 1 capsule (100 mg total) by mouth 2 (two) times daily as needed (pain). 60 capsule 2   guaiFENesin (MUCINEX) 600 MG 12 hr tablet Take 1 tablet (600 mg total) by mouth 2 (two) times daily. 20 tablet 0   ibuprofen (ADVIL) 200 MG tablet Take 800-1,000 mg by mouth every 4 (four) hours as needed for headache or moderate pain.     losartan (COZAAR) 50 MG tablet Take 1 tablet (50 mg total) by mouth daily. 90 tablet 1   No current facility-administered medications for this visit.    SURGICAL HISTORY:  Past Surgical History:  Procedure Laterality Date   EXTERNAL FIXATION LEG Right 01/16/2019   Procedure: EXTERNAL FIXATION RIGHT KNEE;  Surgeon: Roby Lofts, MD;  Location: [facility] OR;  Service: Orthopedics;  Laterality:  Right;   HARDWARE REMOVAL Right 04/17/2019   Procedure: HARDWARE REMOVAL RIGHT TIBIA;  Surgeon: Roby Lofts, MD;  Location: [facility] OR;  Service: Orthopedics;  Laterality: Right;   IRRIGATION AND DEBRIDEMENT KNEE Right 04/17/2019   Procedure: IRRIGATION AND DEBRIDEMENT RIGHT KNEE;  Surgeon: Roby Lofts, MD;  Location: [facility] OR;  Service: Orthopedics;  Laterality: Right;   LYMPHADENECTOMY Left    ORIF TIBIA PLATEAU Right 01/20/2019   Procedure: OPEN REDUCTION INTERNAL FIXATION (ORIF) TIBIAL PLATEAU;  Surgeon: Roby Lofts, MD;  Location: [facility] OR;  Service: Orthopedics;  Laterality: Right;   SHOULDER SURGERY Left    chronic L dislocating     REVIEW OF SYSTEMS:   Review of Systems  Constitutional: Positive for intentional weight loss.  Negative for appetite change, chills, fatigue, and fever. HENT: Negative for mouth sores, nosebleeds,  sore throat and trouble swallowing.   Eyes: Negative for eye problems and icterus.  Respiratory: Negative for cough, hemoptysis, shortness of breath and wheezing.   Cardiovascular: Negative for chest pain and leg swelling.  Gastrointestinal: Negative for abdominal pain, constipation, diarrhea, nausea and vomiting.  Genitourinary: Negative for bladder incontinence, difficulty urinating, dysuria, frequency and hematuria.   Musculoskeletal: Negative for back pain, gait problem, neck pain and neck stiffness.  Skin: Positive for swelling of the right upper extremity.  Positive for large wound on the right lateral lower leg/upper ankle. Neurological: Negative for dizziness, extremity weakness, gait problem, headaches, light-headedness and seizures.  Hematological: Negative for adenopathy. Does not bruise/bleed easily.  Psychiatric/Behavioral: Negative for confusion, depression and sleep disturbance. The patient is not nervous/anxious.     PHYSICAL EXAMINATION:  Blood pressure (!) 149/104, pulse (!) 102, temperature 97.9 F (36.6 C), temperature source Oral, resp.  rate 17, weight 261 lb 4.8 oz (118.5 kg), SpO2 96%.  ECOG PERFORMANCE STATUS: 1  Physical Exam  Constitutional: Oriented to person, place, and time and well-developed, well-nourished, and in no distress.  HENT:  Head: Normocephalic and atraumatic.  Mouth/Throat: Oropharynx is clear and moist. No oropharyngeal exudate.  Eyes: Conjunctivae are normal. Right eye exhibits no discharge. Left eye exhibits no discharge. No scleral icterus.  Neck: Normal range of motion. Neck supple.  Cardiovascular: Normal rate, regular rhythm, normal heart sounds and intact distal pulses.   Pulmonary/Chest: Effort normal and breath sounds normal. No respiratory distress. No wheezes. No rales.  Abdominal: Soft. Bowel sounds are normal. Exhibits no distension and no mass. There is no tenderness.  Musculoskeletal: Normal range of motion. Exhibits no edema.  Lymphadenopathy:    No cervical adenopathy.  Neurological: Alert and oriented to person, place, and time. Exhibits normal muscle tone. Gait normal. Coordination normal.  Skin: Dry skin and skin darkening on the lower extremities secondary to venous stasis dermatitis.  Large wound on the right lateral leg.  Chronic swelling of the right arm due to lymph node dissection.    skin is warm and dry. No rash noted. Not diaphoretic. No erythema. No pallor.  Psychiatric: Mood, memory and judgment normal.  Vitals reviewed.  LABORATORY DATA: Lab Results  Component Value Date   WBC 9.0 10/18/2022   HGB 13.6 10/18/2022   HCT 41.7 10/18/2022   MCV 85.2 10/18/2022   PLT 345.0 10/18/2022      Chemistry      Component Value Date/Time   NA 137 10/18/2022 1347   K 4.5 10/18/2022 1347   CL 101 10/18/2022 1347   CO2 26 10/18/2022 1347   BUN 25 (H) 10/18/2022 1347   CREATININE 1.41 10/18/2022 1347   CREATININE 0.92 06/23/2019 1543      Component Value Date/Time   CALCIUM 9.9 10/18/2022 1347   ALKPHOS 92 10/18/2022 1347   AST 16 10/18/2022 1347   ALT 9  10/18/2022 1347   BILITOT 0.2 10/18/2022 1347       RADIOGRAPHIC STUDIES:  NM PET Image Restage (PS) Whole Body  Result Date: 08/22/2023 CLINICAL DATA:  Subsequent treatment strategy for metastatic melanoma. EXAM: NUCLEAR MEDICINE PET WHOLE BODY TECHNIQUE: 14.7 mCi F-18 FDG was injected intravenously. Full-ring PET imaging was performed from the head to foot after the radiotracer. CT data was obtained and used for attenuation correction and anatomic localization. Fasting blood glucose: 84 mg/dl COMPARISON:  11/91/4782 FINDINGS: Mediastinal blood pool activity: SUV max 4.49 HEAD/NECK: Previously reported left posterior triangle lymph node has an SUV max  of 1.0 on today's study. Previously 2.3. No new tracer avid lymph nodes. Incidental CT findings: none CHEST: Persistent tracer avid left axillary lymph nodes. Index nodes include: -Tracer avid left axillary lymph node measures 1 cm and has an SUV max of 5.47, image 93/4. Formally 1 cm with SUV max of 5.39. -Index lymph node within the left posterior axilla measures 9 mm with SUV max of 5.95, image 98/4. Previously this measured 7 mm with SUV max of 3.06. There is no tracer avid mediastinal, supraclavicular, or hilar adenopathy. Again seen is subcutaneous soft tissue stranding with increased tracer uptake along the midline of the ventral abdominal wall with focal area of more intense uptake, slightly eccentric to the left of midline which corresponds to a focal area of increased soft tissue measuring 9 mm with SUV max of 4.31, image 106/4. On the previous exam the SUV max was equal to 7.33. There is surrounding foci of subcutaneous gas as noted previously. Incidental CT findings: No tracer avid pulmonary nodule. Similar appearance scarring and fibrosis within the right apex. ABDOMEN/PELVIS: There is no abnormal tracer activity identified within the liver, pancreas, spleen, or adrenal glands. No tracer avid abdominal or pelvic lymph nodes. -Previous index left  inguinal lymph node has an SUV max of 1.34 on today's exam, image 238/4. On the previous exam 2.38. -RIGHT INGUINAL LYMPH NODE MEASURES 1.1 CM AND HAS AN SUV MAX OF 3.92, IMAGE 235/4.  ON THE PREVIOUS EXAM THIS MEASURED 0.8 CM WITH SUV MAX OF 2.38.: -RIGHT INGUINAL LYMPH NODE MEASURES 1.1 CM AND HAS AN SUV MAX OF 3.92, IMAGE 235/4. ON THE PREVIOUS EXAM THIS MEASURED 0.8 CM WITH SUV MAX OF 2.38. Incidental CT findings: none SKELETON: No focal hypermetabolic activity to suggest skeletal metastasis. Incidental CT findings: none EXTREMITIES: Multiple foci of increased radiotracer uptake are identified throughout the soft tissues of bilateral upper extremities. Unfortunately, due to patient body habitus and patient positioning the corresponding CT images are largely excluded from the field of view. These are of uncertain clinical significance. Certainly multiple areas of metastatic disease cannot be excluded. Multifocal areas of soft tissue inflammation or infection may also have this appearance. Correlation with patient's clinical history and physical exam findings is advised. For example: -Within the distal forearm there is a focal area of increased radiotracer uptake which has an SUV max of 15.57. Within the, image 203 of the fused PET-CT images. -within the lateral aspect of the proximal right forearm there is a superficial area of increased tracer uptake which has an SUV max of 14.87, image 177 of the fused PET-CT images. Multiple foci of increased radiotracer uptake are identified throughout the soft tissues of bilateral upper extremities. Unfortunately, due to patient body habitus and patient positioning the corresponding CT images are largely excluded from the field of view. These are of uncertain clinical significance. Certainly multiple areas of metastatic disease cannot be excluded. Multifocal areas of soft tissue inflammation or infection may also have this appearance. Correlation with patient's clinical  history and physical exam findings is advised. Focal area of increased uptake along the dorsum of the left foot is identified which has an SUV max of 7.90. On the previous exam the SUV max was equal to 3.3. Incidental CT findings: none IMPRESSION: 1. Persistent tracer avid left axillary lymph nodes are identified. Index nodes are similar in size and FDG avidity. Interval resolution of FDG uptake associated with small left posterior triangle cervical lymph node. 2. Persistent subcutaneous soft tissue stranding with  increased tracer uptake along the midline of the ventral abdominal wall with focal area of more intense uptake, slightly eccentric to the left of midline which corresponds to a focal area of increased soft tissue measuring 9 mm with SUV max of 4.31. On the previous exam the SUV max was equal to 7.33. There is surrounding foci of subcutaneous gas as noted previously. This is of uncertain significance and may be iatrogenic, postinflammatory or infectious in etiology. Clinical correlation advised. 3. Multiple foci of increased radiotracer uptake are identified throughout the soft tissues of bilateral upper extremities. Unfortunately, due to patient body habitus and patient positioning the corresponding CT images are largely excluded from the field of view. These are of uncertain clinical significance. Certainly multiple areas of metastatic disease cannot be excluded. Multifocal areas of soft tissue inflammation or infection may also have this appearance. Correlation with patient's clinical history and physical exam findings is advised. 4. Increased tracer uptake associated with the dorsum of the left foot current SUV max of 7.90. On the previous exam the SUV max was equal to 3.3. Correlation with physical exam findings advised. Electronically Signed   By: Signa Kell M.D.   On: 08/22/2023 08:45     ASSESSMENT/PLAN:  This is a very pleasant 52 year old Caucasian male with history of melanoma.  The  patient was diagnosed with abdominal wall melanoma stage IIa in 2009 status post excision.  He then developed stage III disease with axillary lymph node involvement in 2012 for which he underwent treatment with ipilimumab developed cellulitis and hepatitis.  He has been on surveillance   The patient had a PET scan performed in September 2023 that showed left axillary and pelvic adenopathy which could be reactive but metastatic lymphadenopathy cannot be ruled out.  Therefore, he had a repeat PET scan performed recently.   The patient was seen with Dr. Arbutus Ped today.  Dr. Arbutus Ped personally independently reviewed the PET scan discussed the results with the patient today.  The scan showed left dorsal foot hypermetabolism, multiple foci of uptake in the soft tissue of the bilateral upper extremities which could be multifocal areas of soft tissue inflammation or infection but cannot rule out multiple areas of metastatic disease, stranding of the ventral abdominal wall.  This could be related to infection.  Therefore Dr. Arbutus Ped recommends close monitoring with follow-up PET scan in 6 months.  We will send him a prescription of antibiotics with Efalex.  I will also place an urgent referral to wound care and dermatology for skin checks and to get their input on his PET scan.  I let the patient know that his voicemail is full and has been challenging getting in touch with him.  We discussed the importance of taking care of his health and follow-up appointments.  Patient does not hear from wound care or dermatology to make an appointment the next week or 2, I advised him to call our clinic so we may follow-up on the referrals.  We will reach out to his PCP to help facilitate a refill of his antihypertensives.  Per Dr. Alver Fisher last note, if he is found to have metastatic melanoma then Dr. Clelia Croft recommended restarting treatment with combination ipilimumab and nivolumab or BRAF targeted therapy.   The patient was  advised to call immediately if she has any concerning symptoms in the interval. The patient voices understanding of current disease status and treatment options and is in agreement with the current care plan. All questions were answered. The patient knows  to call the clinic with any problems, questions or concerns. We can certainly see the patient much sooner if necessary         Orders Placed This Encounter  Procedures   NM PET Image Restage (PS) Whole Body    Standing Status:   Future    Standing Expiration Date:   08/27/2024    Order Specific Question:   If indicated for the ordered procedure, I authorize the administration of a radiopharmaceutical per Radiology protocol    Answer:   Yes    Order Specific Question:   Preferred imaging location?    Answer:   [place]   CBC with Differential (Cancer Center Only)    Standing Status:   Future    Standing Expiration Date:   08/27/2024   CMP (Cancer Center only)    Standing Status:   Future    Standing Expiration Date:   08/27/2024   Ambulatory referral to Wound care center    Referral Priority:   Urgent    Referral Type:   Consultation    Number of Visits Requested:   1   Ambulatory referral to Dermatology    Referral Priority:   Urgent    Referral Type:   Consultation    Referral Reason:   Specialty Services Required    Requested Specialty:   Dermatology    Number of Visits Requested:   1      Annabell Oconnor L Kaleo Condrey, PA-C 08/28/23  ADDENDUM: Hematology/Oncology Attending: I had a face-to-face encounter with the patient today.  I reviewed his record, lab, scan and recommended his care plan.  This is a very pleasant 52 years old white male with cutaneous melanoma of the abdominal wall diagnosed in 2009 initially as a stage IIa then the patient had evidence for right axillary lymphadenopathy in 2012.  He is status post wide excision of the initial tumor in addition to axillary lymph node dissection in 2012.  He also received  treatment with radiotherapy to the right axilla in 2012.  He was treated with adjuvant treatment with ipilimumab until February 2013 discontinued secondary to acute hepatitis.  The patient has been on observation since that time.  He had several skin issues and nonhealing ulcer in the legs.  He was managed by orthopedic surgery.  He had a PET scan performed recently.  I personally and independently reviewed the PET scan images and discussed the result with the patient today. His PET scan showed no clear evidence for systemic progression but he continues to have metabolic activity and some of the skin lesions that likely inflammatory in origin. I recommended for the patient to see wound clinic for management of his persistent ulcers and skin infections. We will see him back for follow-up visit in 6 months for evaluation and repeat PET scan. He was advised to call immediately if he has any other concerning symptoms in the interval. The total time spent in the appointment was 30 minutes. Disclaimer: This note was dictated with voice recognition software. Similar sounding words can inadvertently be transcribed and may be missed upon review. Lajuana Matte, MD

## 2023-08-28 NOTE — Progress Notes (Signed)
This nurse reached out to Larry Sierras office and advised that patient is out of blood pressure medication.  Advised that reading from today was 149/104.  No further concerns at this time.

## 2023-08-28 NOTE — Progress Notes (Signed)
Faxed referrals to [facility] and [facility] today.  No further concerns noted at this time.

## 2023-08-29 ENCOUNTER — Telehealth: Payer: Self-pay | Admitting: Nurse Practitioner

## 2023-08-29 DIAGNOSIS — I10 Essential (primary) hypertension: Secondary | ICD-10-CM

## 2023-08-29 NOTE — Telephone Encounter (Signed)
Please call patient and get him scheduled for an appoint with me as soon as possible.  I received a message from his oncologist office that he is out of his antihypertensives.  He has not seen me in almost 1 year so I need to see him in person for routine labs so that his antihypertensives can be represcribed.

## 2023-08-29 NOTE — Telephone Encounter (Signed)
Left voicemail for pt to call back.

## 2023-08-30 ENCOUNTER — Telehealth: Payer: MEDICAID | Admitting: Nurse Practitioner

## 2023-08-30 NOTE — Telephone Encounter (Signed)
LVM for pt to call back to schedule an OV appt

## 2023-08-30 NOTE — Telephone Encounter (Signed)
Pt has virtual visit appt today @ 5:40pm

## 2023-09-05 ENCOUNTER — Encounter: Payer: Self-pay | Admitting: Nurse Practitioner

## 2023-09-05 ENCOUNTER — Other Ambulatory Visit: Payer: Self-pay | Admitting: Nurse Practitioner

## 2023-09-05 ENCOUNTER — Ambulatory Visit: Payer: MEDICAID | Admitting: Nurse Practitioner

## 2023-09-05 DIAGNOSIS — E669 Obesity, unspecified: Secondary | ICD-10-CM

## 2023-09-05 DIAGNOSIS — I10 Essential (primary) hypertension: Secondary | ICD-10-CM

## 2023-09-20 ENCOUNTER — Telehealth: Payer: Self-pay | Admitting: Nurse Practitioner

## 2023-09-20 ENCOUNTER — Ambulatory Visit (INDEPENDENT_AMBULATORY_CARE_PROVIDER_SITE_OTHER): Payer: MEDICAID | Admitting: Nurse Practitioner

## 2023-09-20 VITALS — BP 164/102 | HR 91 | Temp 98.0°F | Ht 75.0 in | Wt 265.5 lb

## 2023-09-20 DIAGNOSIS — Z6833 Body mass index (BMI) 33.0-33.9, adult: Secondary | ICD-10-CM | POA: Diagnosis not present

## 2023-09-20 DIAGNOSIS — E669 Obesity, unspecified: Secondary | ICD-10-CM | POA: Diagnosis not present

## 2023-09-20 DIAGNOSIS — I10 Essential (primary) hypertension: Secondary | ICD-10-CM | POA: Diagnosis not present

## 2023-09-20 LAB — CBC
HCT: 40.9 % (ref 39.0–52.0)
Hemoglobin: 12.9 g/dL — ABNORMAL LOW (ref 13.0–17.0)
MCHC: 31.5 g/dL (ref 30.0–36.0)
MCV: 85.1 fL (ref 78.0–100.0)
Platelets: 454 10*3/uL — ABNORMAL HIGH (ref 150.0–400.0)
RBC: 4.81 Mil/uL (ref 4.22–5.81)
RDW: 15.9 % — ABNORMAL HIGH (ref 11.5–15.5)
WBC: 8.7 10*3/uL (ref 4.0–10.5)

## 2023-09-20 LAB — COMPREHENSIVE METABOLIC PANEL
ALT: 4 U/L (ref 0–53)
AST: 10 U/L (ref 0–37)
Albumin: 4 g/dL (ref 3.5–5.2)
Alkaline Phosphatase: 84 U/L (ref 39–117)
BUN: 15 mg/dL (ref 6–23)
CO2: 25 meq/L (ref 19–32)
Calcium: 9.5 mg/dL (ref 8.4–10.5)
Chloride: 104 meq/L (ref 96–112)
Creatinine, Ser: 1.02 mg/dL (ref 0.40–1.50)
GFR: 86 mL/min (ref >60.00)
Glucose, Bld: 89 mg/dL (ref 70–99)
Potassium: 3.7 meq/L (ref 3.5–5.1)
Sodium: 139 meq/L (ref 135–145)
Total Bilirubin: 0.4 mg/dL (ref 0.2–1.2)
Total Protein: 7.9 g/dL (ref 6.0–8.3)

## 2023-09-20 LAB — LIPID PANEL
Cholesterol: 192 mg/dL (ref 0–200)
HDL: 35.5 mg/dL — ABNORMAL LOW (ref >39.00)
LDL Cholesterol: 118 mg/dL — ABNORMAL HIGH (ref 0–99)
NonHDL: 156.72
Total CHOL/HDL Ratio: 5
Triglycerides: 195 mg/dL — ABNORMAL HIGH (ref 0.0–149.0)
VLDL: 39 mg/dL (ref 0.0–40.0)

## 2023-09-20 LAB — HEMOGLOBIN A1C: Hgb A1c MFr Bld: 5.3 % (ref 4.6–6.5)

## 2023-09-20 LAB — TSH: TSH: 2.53 u[IU]/mL (ref 0.35–5.50)

## 2023-09-20 MED ORDER — LOSARTAN POTASSIUM 50 MG PO TABS
50.0000 mg | ORAL_TABLET | Freq: Every day | ORAL | 0 refills | Status: DC
Start: 2023-09-20 — End: 2023-09-20

## 2023-09-20 MED ORDER — ATENOLOL 25 MG PO TABS
25.0000 mg | ORAL_TABLET | Freq: Every day | ORAL | 0 refills | Status: DC
Start: 2023-09-20 — End: 2023-09-20

## 2023-09-20 MED ORDER — LOSARTAN POTASSIUM 50 MG PO TABS
50.0000 mg | ORAL_TABLET | Freq: Every day | ORAL | 0 refills | Status: DC
Start: 2023-09-20 — End: 2024-04-09

## 2023-09-20 MED ORDER — ATENOLOL 25 MG PO TABS
25.0000 mg | ORAL_TABLET | Freq: Every day | ORAL | 0 refills | Status: DC
Start: 2023-09-20 — End: 2024-04-09

## 2023-09-20 NOTE — Assessment & Plan Note (Signed)
Chronic Labs ordered, further recommendations may be made based upon these results.

## 2023-09-20 NOTE — Progress Notes (Signed)
Established Patient Office Visit  Subjective   Patient ID: Evan [name], male    DOB: Mar 13, 1971  Age: 52 y.o. MRN: [id number]  Chief Complaint  Patient presents with   Hypertension    HTN: Chronic, is out of his antihypertensives.  Here for follow-up and medication refill.  Denies any negative side effects associated with his antihypertensives.  No chest pain or shortness of breath.     ROS: see HPI    Objective:     BP (!) 164/102   Pulse 91   Temp 98 F (36.7 C) (Temporal)   Ht 6\' 3"  (1.905 m)   Wt 265 lb 8 oz (120.4 kg)   SpO2 95%   BMI 33.19 kg/m  BP Readings from Last 3 Encounters:  09/20/23 (!) 164/102  08/28/23 (!) 149/104  07/04/23 123/85   Wt Readings from Last 3 Encounters:  09/20/23 265 lb 8 oz (120.4 kg)  08/28/23 261 lb 4.8 oz (118.5 kg)  07/04/23 295 lb 6.4 oz (134 kg)      Physical Exam Vitals reviewed.  Constitutional:      Appearance: Normal appearance.  HENT:     Head: Normocephalic and atraumatic.  Cardiovascular:     Rate and Rhythm: Normal rate and regular rhythm.  Pulmonary:     Effort: Pulmonary effort is normal.     Breath sounds: Normal breath sounds.  Musculoskeletal:     Cervical back: Neck supple.  Skin:    General: Skin is warm and dry.  Neurological:     Mental Status: He is alert and oriented to person, place, and time.  Psychiatric:        Mood and Affect: Mood normal.        Behavior: Behavior normal.        Thought Content: Thought content normal.        Judgment: Judgment normal.    Estimated Creatinine Clearance: 121.2 mL/min (by C-G formula based on SCr of 1.02 mg/dL).   Results for orders placed or performed in visit on 09/20/23  CBC  Result Value Ref Range   WBC 8.7 4.0 - 10.5 K/uL   RBC 4.81 4.22 - 5.81 Mil/uL   Platelets 454.0 (H) 150.0 - 400.0 K/uL   Hemoglobin 12.9 (L) 13.0 - 17.0 g/dL   HCT 40.9 81.1 - 91.4 %   MCV 85.1 78.0 - 100.0 fl   MCHC 31.5 30.0 - 36.0 g/dL   RDW 78.2 (H) 95.6 -  15.5 %  TSH  Result Value Ref Range   TSH 2.53 0.35 - 5.50 uIU/mL  Lipid panel  Result Value Ref Range   Cholesterol 192 0 - 200 mg/dL   Triglycerides 213.0 (H) 0.0 - 149.0 mg/dL   HDL 86.57 (L) >84.69 mg/dL   VLDL 62.9 0.0 - 52.8 mg/dL   LDL Cholesterol 413 (H) 0 - 99 mg/dL   Total CHOL/HDL Ratio 5    NonHDL 156.72   Hemoglobin A1c  Result Value Ref Range   Hgb A1c MFr Bld 5.3 4.6 - 6.5 %  Comprehensive metabolic panel  Result Value Ref Range   Sodium 139 135 - 145 mEq/L   Potassium 3.7 3.5 - 5.1 mEq/L   Chloride 104 96 - 112 mEq/L   CO2 25 19 - 32 mEq/L   Glucose, Bld 89 70 - 99 mg/dL   BUN 15 6 - 23 mg/dL   Creatinine, Ser 2.44 0.40 - 1.50 mg/dL   Total Bilirubin 0.4 0.2 - 1.2 mg/dL  Alkaline Phosphatase 84 39 - 117 U/L   AST 10 0 - 37 U/L   ALT 4 0 - 53 U/L   Total Protein 7.9 6.0 - 8.3 g/dL   Albumin 4.0 3.5 - 5.2 g/dL   GFR 56.21 >30.86 mL/min   Calcium 9.5 8.4 - 10.5 mg/dL      The 57-QION ASCVD risk score (Arnett DK, et al., 2019) is: 8.4%    Assessment & Plan:   Problem List Items Addressed This Visit       Cardiovascular and Mediastinum   Essential hypertension - Primary    Chronic, blood pressure uncontrolled today Patient has been out of his antihypertensives Patient unable to stay today to have repeat metabolic panel completed. Send in 10-day refill of atenolol and losartan until patient can return to lab for repeat metabolic panel.  Additional refills will be ordered based on lab results.      Relevant Medications   losartan (COZAAR) 50 MG tablet   atenolol (TENORMIN) 25 MG tablet   Other Relevant Orders   Comprehensive metabolic panel (Completed)   Hemoglobin A1c (Completed)   Lipid panel (Completed)   TSH (Completed)   CBC (Completed)     Other   Class 1 obesity with serious comorbidity and body mass index (BMI) of 33.0 to 33.9 in adult    Chronic Labs ordered, further recommendations may be made based upon these results        Relevant Medications   losartan (COZAAR) 50 MG tablet   atenolol (TENORMIN) 25 MG tablet   Other Relevant Orders   Comprehensive metabolic panel (Completed)   Hemoglobin A1c (Completed)   Lipid panel (Completed)   TSH (Completed)   CBC (Completed)    Return in about 3 months (around 12/20/2023) for F/U with Ailis Rigaud.    Elenore Paddy, NP

## 2023-09-20 NOTE — Telephone Encounter (Signed)
Patient was seen today by Jiles Prows. She sent in 10 day supplies of atenolol (TENORMIN) 25 MG tablet and losartan (COZAAR) 50 MG tablet until he got his labs done. Patient went ahead and had his labs done and would like to know if Jiles Prows can send in the 90 day supply instead. He would still like it sent to [id number] - [place], [place] - [address] AT [facility] [address]. Best callback is [phone].

## 2023-09-20 NOTE — Assessment & Plan Note (Signed)
Chronic, blood pressure uncontrolled today Patient has been out of his antihypertensives Patient unable to stay today to have repeat metabolic panel completed. Send in 10-day refill of atenolol and losartan until patient can return to lab for repeat metabolic panel.  Additional refills will be ordered based on lab results.

## 2023-09-24 NOTE — Telephone Encounter (Signed)
LVM for patient to call back regarding Renda Rolls message with starting the medication and having labs rechecked 7-10 days after him starting this to see if labs are stable.

## 2023-10-01 ENCOUNTER — Ambulatory Visit: Payer: MEDICAID | Admitting: Dermatology

## 2023-10-08 ENCOUNTER — Encounter (HOSPITAL_BASED_OUTPATIENT_CLINIC_OR_DEPARTMENT_OTHER): Payer: MEDICAID | Attending: General Surgery | Admitting: General Surgery

## 2023-10-08 DIAGNOSIS — I872 Venous insufficiency (chronic) (peripheral): Secondary | ICD-10-CM | POA: Diagnosis present

## 2023-10-08 DIAGNOSIS — C439 Malignant melanoma of skin, unspecified: Secondary | ICD-10-CM | POA: Insufficient documentation

## 2023-10-08 DIAGNOSIS — L97812 Non-pressure chronic ulcer of other part of right lower leg with fat layer exposed: Secondary | ICD-10-CM | POA: Insufficient documentation

## 2023-10-08 NOTE — Progress Notes (Signed)
or more medical diagnoseso) 0 No Ambulatory aid None/bed rest/wheelchair/nurse 0 Yes Crutches/cane/walker 0 No Furniture 0 No Intravenous therapy Access/Saline/Heparin Lock 0 No Gait/Transferring Normal/ bed rest/ wheelchair 0 Yes Weak (short steps with or without shuffle, stooped but able to lift head while walking, may seek 0 No support from furniture) Impaired (short steps with shuffle, may have difficulty arising from chair, head down, impaired 0 No balance) Mental Status Oriented to own ability 0 Yes Electronic Signature(s) Signed: 10/08/2023 4:01:24 PM By: Zenaida Deed RN, BSN Entered By: Zenaida Deed on 10/08/2023 09:28:20 -------------------------------------------------------------------------------- Foot Assessment Details Patient Name: Date of Service: Evan [name], Evan Canny C. 10/08/2023 9:00 A M Medical Record Number: [id number] Patient Account Number: [id number] Date of Birth/Sex: Treating RN: April 11, 1971 (52 y.o. Evan [name] Primary Care Champ Keetch: Jiles Prows Other Clinician: Referring Justis Dupas: Treating Kasyn Stouffer/Extender: Duanne Guess Heilingoetter, Cassandra Weeks in Treatment: 0 Foot Assessment Items Site Locations + = Sensation present, - = Sensation absent, C = Callus, U = Ulcer R  = Redness, W = Warmth, M = Maceration, PU = Pre-ulcerative lesion F = Fissure, S = Swelling, D = Dryness Assessment Right: Left: Other Deformity: No No Prior Foot Ulcer: No No Prior Amputation: No No Charcot Joint: No No Ambulatory Status: Ambulatory Without Help GaitTAKASHI, KOROL ([id number]) [id number] Page 4 of 4 Electronic Signature(s) Signed: 10/08/2023 4:01:24 PM By: Zenaida Deed RN, BSN Entered By: Zenaida Deed on 10/08/2023 09:28:57 -------------------------------------------------------------------------------- Nutrition Risk Screening Details Patient Name: Date of Service: Evan [name], Evan Canny C. 10/08/2023 9:00 A M Medical Record Number: [id number] Patient Account Number: [id number] Date of Birth/Sex: Treating RN: 06-25-1971 (52 y.o. Evan [name] Primary Care Annaelle Kasel: Jiles Prows Other Clinician: Referring Angelo Caroll: Treating Sayyid Harewood/Extender: Duanne Guess Heilingoetter, Cassandra Weeks in Treatment: 0 Height (in): 75 Weight (lbs): 260 Body Mass Index (BMI): 32.5 Nutrition Risk Screening Items Score Screening NUTRITION RISK SCREEN: I have an illness or condition that made me change the kind and/or amount of food I eat 0 No I eat fewer than two meals per day 0 No I eat few fruits and vegetables, or milk products 0 No I have three or more drinks of beer, liquor or wine almost every day 0 No I have tooth or mouth problems that make it hard for me to eat 0 No I don't always have enough money to buy the food I need 0 No I eat alone most of the time 1 Yes I take three or more different prescribed or over-the-counter drugs a day 0 No Without wanting to, I have lost or gained 10 pounds in the last six months 0 No I am not always physically able to shop, cook and/or feed myself 0 No Nutrition Protocols Good Risk Protocol 0 No interventions needed Moderate Risk Protocol High Risk Proctocol Risk Level: Good  Risk Score: 1 Electronic Signature(s) Signed: 10/08/2023 4:01:24 PM By: Zenaida Deed RN, BSN Entered By: Zenaida Deed on 10/08/2023 09:28:46  MAJESTIC, BRISTER ([id number]) [phone] [id number] Page 1 of 4 Visit Report for 10/08/2023 Abuse Risk Screen Details Patient Name: Date of Service: Evan [name] 10/08/2023 9:00 A M Medical Record Number: [id number] Patient Account Number: [id number] Date of Birth/Sex: Treating RN: 1970/12/27 (52 y.o. Evan [name] Primary Care Nikie Cid: Jiles Prows Other Clinician: Referring Imri Lor: Treating Wilma Michaelson/Extender: Duanne Guess Heilingoetter, Cassandra Weeks in Treatment: 0 Abuse Risk Screen Items Answer ABUSE RISK SCREEN: Has anyone close to you tried to hurt or harm you recentlyo No Do you feel uncomfortable with anyone in your familyo No Has anyone forced you do things that you didnt want to doo No Electronic Signature(s) Signed: 10/08/2023 4:01:24 PM By: Zenaida Deed RN, BSN Entered By: Zenaida Deed on 10/08/2023 09:27:13 -------------------------------------------------------------------------------- Activities of Daily Living Details Patient Name: Date of Service: Evan [name] 10/08/2023 9:00 A M Medical Record Number: [id number] Patient Account Number: [id number] Date of Birth/Sex: Treating RN: 01/26/1971 (52 y.o. Evan [name] Primary Care Fina Heizer: Jiles Prows Other Clinician: Referring Ardice Boyan: Treating Avan Gullett/Extender: Duanne Guess Heilingoetter, Cassandra Weeks in Treatment: 0 Activities of Daily Living Items Answer Activities of Daily Living (Please select one for each item) Drive Automobile Completely Able T Medications ake Completely Able Use T elephone Completely Able Care for Appearance Completely Able Use T oilet Completely Able Bath / Shower Completely Able Dress Self Completely Able Feed Self Completely Able Walk Completely Able Get In / Out Bed Completely Able Housework Completely Able Prepare Meals Completely Able Handle Money Completely Able Shop for Self Completely Able Electronic  Signature(s) Signed: 10/08/2023 4:01:24 PM By: Zenaida Deed RN, BSN Entered By: Zenaida Deed on 10/08/2023 09:27:34 [name] ([id number]) [id number] Page 2 of 4 -------------------------------------------------------------------------------- Education Screening Details Patient Name: Date of Service: Evan [name], [place] 10/08/2023 9:00 A M Medical Record Number: [id number] Patient Account Number: [id number] Date of Birth/Sex: Treating RN: [birth year] (52 y.o. Evan [name] Primary Care Gustie Bobb: Jiles Prows Other Clinician: Referring Shakena Callari: Treating Zauria Dombek/Extender: Duanne Guess Heilingoetter, Charletta Cousin in Treatment: 0 Primary Learner Assessed: Patient Learning Preferences/Education Level/Primary Language Learning Preference: Explanation, Demonstration, Printed Material Highest Education Level: College or Above Preferred Language: English Cognitive Barrier Language Barrier: No Translator Needed: No Memory Deficit: No Emotional Barrier: No Cultural/Religious Beliefs Affecting Medical Care: No Physical Barrier Impaired Vision: No Impaired Hearing: No Decreased Hand dexterity: No Knowledge/Comprehension Knowledge Level: High Comprehension Level: High Ability to understand written instructions: High Ability to understand verbal instructions: High Motivation Anxiety Level: Calm Cooperation: Cooperative Education Importance: Acknowledges Need Interest in Health Problems: Asks Questions Perception: Coherent Willingness to Engage in Self-Management High Activities: Readiness to Engage in Self-Management High Activities: Electronic Signature(s) Signed: 10/08/2023 4:01:24 PM By: Zenaida Deed RN, BSN Entered By: Zenaida Deed on 10/08/2023 09:28:04 -------------------------------------------------------------------------------- Fall Risk Assessment Details Patient Name: Date of Service: Evan [name], [place] REY C.  10/08/2023 9:00 A M Medical Record Number: [id number] Patient Account Number: [id number] Date of Birth/Sex: Treating RN: November 15, 1971 (52 y.o. Evan [name] Primary Care Abbey Veith: Jiles Prows Other Clinician: Referring Dishon Kehoe: Treating Maelynn Moroney/Extender: Duanne Guess Heilingoetter, Cassandra Weeks in Treatment: 0 Fall Risk Assessment Items Have you had 2 or more falls in the last 12 monthso 0 No BACILIO, ABASCAL C ([id number]) [name] [id number] Page 3 of 4 Have you had any fall that resulted in injury in the last 12 monthso 0 No FALLS RISK SCREEN History of falling - immediate or within 3 months 0 No Secondary diagnosis (Do you have 2

## 2023-10-08 NOTE — Progress Notes (Addendum)
Entered By: Evan [name] on 10/08/2023 09:53:38 Evan [name] ([id number]) [id number] Page 3 of 11 -------------------------------------------------------------------------------- HPI Details Patient Name: Date of Service: Evan [name], [place] 10/08/2023 9:00 A M Medical Record Number: [id number] Patient Account Number: [id number] Date of Birth/Sex: Treating RN: Sep 01, 1971 (52 y.o. 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 He also has a history of significant trauma to his right lower leg that required surgical repair of a tibial plateau fracture. He has venous insufficiency as evidenced by a venous reflux study performed in 2023, but  vascular surgery has evaluated him and determined that he does not have a sufficient segment long enough to ablate for any benefit. He has had a wound on his right lower leg for about 3 years. He was previously seen in the orthopedic clinic by Evan [name]. He has been on compression, either a stocking or a wrap without significant improvement in the wound. He was seen oncology last month and they placed a referral to the wound care center for further evaluation and management of this wound. Electronic Signature(s) Signed: 10/08/2023 9:57:01 AM By: Evan Guess MD [name] Previous Signature: 10/08/2023 9:03:08 AM Version By: Evan Guess MD [name] Entered By: Evan [name] on 10/08/2023 09:57:00 -------------------------------------------------------------------------------- Physical Exam Details Patient Name: Date of Service: Evan [name], [place] Evan [name]. 10/08/2023 9:00 A M Medical Record Number: [id number] Patient Account Number: [id number] Date of Birth/Sex: Treating RN: 06/27/1971 (52 y.o. M) Primary Care Provider: Jiles [name] Other Clinician: Referring Provider: Treating Provider/Extender: Evan [name] Evan [name], Evan [name] in Treatment: 0 Constitutional ..... Respiratory Normal work of breathing on room air. Notes 10/08/2023: His right lower leg has hemosiderin staining and other changes consistent with chronic venous insufficiency. There are 2 open ulcers, 1 more medial and 1 more lateral to the anterior tibial surface. There is evidence of a scar that has healed just proximal to the more lateral of these wounds, consistent with the given history of trauma to the site. Electronic Signature(s) Signed: 10/08/2023 9:58:42 AM By: Evan Guess MD [name] Entered By: Evan [name] on 10/08/2023 09:58:41 Physician Orders Details -------------------------------------------------------------------------------- Evan [name] ([id number]) [id number]  Page 4 of 11 Patient Name: Date of Service: Evan [name], [place] 10/08/2023 9:00 A M Medical Record Number: [id number] Patient Account Number: [id number] Date of Birth/Sex: Treating RN: [birth year] (52 y.o. Evan [name] Primary Care Provider: Jiles [name] Other Clinician: Referring Provider: Treating Provider/Extender: Evan [name] Evan [name], Evan [name] in Treatment: 0 The following information was scribed by: Evan [name] The information was scribed for: Evan [name] Verbal / Phone Orders: No Diagnosis Coding ICD-10 Coding Code Description [phone] Non-pressure chronic ulcer of other part of right lower leg with fat layer exposed I87.2 Venous insufficiency (chronic) (peripheral) C43.9 Malignant melanoma of skin, unspecified Follow-up Appointments ppointment in 1 week. - Evan [name] RM 1 Return A Anesthetic Wound #1 Right,Lateral Lower Leg (In clinic) Topical Lidocaine 4% applied to wound bed Wound #2 Right,Anterior Lower Leg (In clinic) Topical Lidocaine 4% applied to wound bed Bathing/ Shower/ Hygiene May shower with protection but do not get wound dressing(s) wet. Protect dressing(s) with water repellant cover (for example, large  plastic bag) or a cast cover and may then take shower. Edema Control - Lymphedema / SCD / Other Elevate legs to the level of the heart or above for 30 minutes daily and/or when sitting for 3-4 times a day throughout the day. - throughout the day Avoid standing for long periods of time. Exercise regularly Compression stocking or Garment 20-30 mm/Hg pressure to: - to left leg daily Wound Treatment Wound #1 - Lower Leg Wound Laterality: Right, Lateral Peri-Wound Care: Sween Lotion (Moisturizing lotion) 1 x Per Week/30 Days Discharge Instructions: Apply moisturizing lotion as directed Prim Dressing: Maxorb Extra Ag+ Alginate Dressing, 2x2 (in/in) 1 x Per Week/30 Days ary Discharge Instructions: Apply to wound bed as  instructed Secondary Dressing: Woven Gauze Sponge, Non-Sterile 4x4 in 1 x Per Week/30 Days Discharge Instructions: Apply over primary dressing as directed. Compression Wrap: Urgo K2 Lite, (equivalent to a 3 layer) two layer compression system, regular 1 x Per Week/30 Days Discharge Instructions: Apply Urgo K2 Lite as directed (alternative to 3 layer compression). Wound #2 - Lower Leg Wound Laterality: Right, Anterior Peri-Wound Care: Sween Lotion (Moisturizing lotion) 1 x Per Week/30 Days Discharge Instructions: Apply moisturizing lotion as directed Prim Dressing: Maxorb Extra Ag+ Alginate Dressing, 2x2 (in/in) 1 x Per Week/30 Days ary Discharge Instructions: Apply to wound bed as instructed Secondary Dressing: Woven Gauze Sponge, Non-Sterile 4x4 in 1 x Per Week/30 Days Discharge Instructions: Apply over primary dressing as directed. Compression Wrap: Urgo K2 Lite, (equivalent to a 3 layer) two layer compression system, regular 1 x Per Week/30 Days Discharge Instructions: Apply Urgo K2 Lite as directed (alternative to 3 layer compression). Patient Medications llergies: Iodinated Contrast Media, Fish Containing Products A Notifications Medication Indication Start End prior to debridement 10/08/2023 lidocaine DOSE topical 4 % cream - cream topical Evan [name], Evan [name] ([id number]) [phone].pdf Page 5 of 11 Electronic Signature(s) Signed: 10/08/2023 10:00:44 AM By: Evan Guess MD [name] Entered By: Evan [name] on 10/08/2023 09:58:57 -------------------------------------------------------------------------------- Problem List Details Patient Name: Date of Service: Evan [name], [place] Evan [name]. 10/08/2023 9:00 A M Medical Record Number: [id number] Patient Account Number: [id number] Date of Birth/Sex: Treating RN: 1971/12/18 (52 y.o. M) Primary Care Provider: Jiles [name] Other Clinician: Referring Provider: Treating Provider/Extender: Evan [name] Evan [name],  Evan [name] in Treatment: 0 Active Problems ICD-10 Encounter Code Description Active Date MDM Diagnosis [phone] Non-pressure chronic ulcer of other part of right lower leg with fat layer 10/08/2023 No Yes exposed I87.2 Venous insufficiency (chronic) (peripheral) 10/08/2023 No Yes C43.9 Malignant melanoma of skin, unspecified 10/08/2023 No Yes Inactive Problems Resolved Problems Electronic Signature(s) Signed: 10/08/2023 8:59:39 AM By: Evan Guess MD [name] Previous Signature: 10/08/2023 8:47:25 AM Version By: Evan Guess MD [name] Entered By: Evan [name] on 10/08/2023 08:59:38 -------------------------------------------------------------------------------- Progress Note Details Patient Name: Date of Service: Evan [name], [place] Evan [name]. 10/08/2023 9:00 A M Medical Record Number: [id number] Patient Account Number: [id number] Date of Birth/Sex: Treating RN: [birth year] (52 y.o. 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 He  plastic bag) or a cast cover and may then take shower. Edema Control - Lymphedema / SCD / Other Elevate legs to the level of the heart or above for 30 minutes daily and/or when sitting for 3-4 times a day throughout the day. - throughout the day Avoid standing for long periods of time. Exercise regularly Compression stocking or Garment 20-30 mm/Hg pressure to: - to left leg daily Wound Treatment Wound #1 - Lower Leg Wound Laterality: Right, Lateral Peri-Wound Care: Sween Lotion (Moisturizing lotion) 1 x Per Week/30 Days Discharge Instructions: Apply moisturizing lotion as directed Prim Dressing: Maxorb Extra Ag+ Alginate Dressing, 2x2 (in/in) 1 x Per Week/30 Days ary Discharge Instructions: Apply to wound bed as  instructed Secondary Dressing: Woven Gauze Sponge, Non-Sterile 4x4 in 1 x Per Week/30 Days Discharge Instructions: Apply over primary dressing as directed. Compression Wrap: Urgo K2 Lite, (equivalent to a 3 layer) two layer compression system, regular 1 x Per Week/30 Days Discharge Instructions: Apply Urgo K2 Lite as directed (alternative to 3 layer compression). Wound #2 - Lower Leg Wound Laterality: Right, Anterior Peri-Wound Care: Sween Lotion (Moisturizing lotion) 1 x Per Week/30 Days Discharge Instructions: Apply moisturizing lotion as directed Prim Dressing: Maxorb Extra Ag+ Alginate Dressing, 2x2 (in/in) 1 x Per Week/30 Days ary Discharge Instructions: Apply to wound bed as instructed Secondary Dressing: Woven Gauze Sponge, Non-Sterile 4x4 in 1 x Per Week/30 Days Discharge Instructions: Apply over primary dressing as directed. Compression Wrap: Urgo K2 Lite, (equivalent to a 3 layer) two layer compression system, regular 1 x Per Week/30 Days Discharge Instructions: Apply Urgo K2 Lite as directed (alternative to 3 layer compression). Patient Medications llergies: Iodinated Contrast Media, Fish Containing Products A Notifications Medication Indication Start End prior to debridement 10/08/2023 lidocaine DOSE topical 4 % cream - cream topical Evan [name], Evan [name] ([id number]) [phone].pdf Page 5 of 11 Electronic Signature(s) Signed: 10/08/2023 10:00:44 AM By: Evan Guess MD [name] Entered By: Evan [name] on 10/08/2023 09:58:57 -------------------------------------------------------------------------------- Problem List Details Patient Name: Date of Service: Evan [name], [place] Evan [name]. 10/08/2023 9:00 A M Medical Record Number: [id number] Patient Account Number: [id number] Date of Birth/Sex: Treating RN: 1971/12/18 (52 y.o. M) Primary Care Provider: Jiles [name] Other Clinician: Referring Provider: Treating Provider/Extender: Evan [name] Evan [name],  Evan [name] in Treatment: 0 Active Problems ICD-10 Encounter Code Description Active Date MDM Diagnosis [phone] Non-pressure chronic ulcer of other part of right lower leg with fat layer 10/08/2023 No Yes exposed I87.2 Venous insufficiency (chronic) (peripheral) 10/08/2023 No Yes C43.9 Malignant melanoma of skin, unspecified 10/08/2023 No Yes Inactive Problems Resolved Problems Electronic Signature(s) Signed: 10/08/2023 8:59:39 AM By: Evan Guess MD [name] Previous Signature: 10/08/2023 8:47:25 AM Version By: Evan Guess MD [name] Entered By: Evan [name] on 10/08/2023 08:59:38 -------------------------------------------------------------------------------- Progress Note Details Patient Name: Date of Service: Evan [name][place] Evan [name]. 10/08/2023 9:00 A M Medical Record Number: [id number] Patient Account Number: [id number] Date of Birth/Sex: Treating RN: [birth year] (52 y.o. 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 He  Evan [name], Evan [name] ([id number]) [id number] Page 1 of 11 Visit Report for 10/08/2023 Chief Complaint Document Details Patient Name: Date of Service: Evan [name] 10/08/2023 9:00 A M Medical Record Number: [id number] Patient Account Number: [id number] Date of Birth/Sex: Treating RN: [birth year] (52 y.o. M) Primary Care Provider: Jiles [name] Other Clinician: Referring Provider: Treating Provider/Extender: Evan [name] Evan [name], Evan [name] in Treatment: 0 Information Obtained from: Patient Chief Complaint Patient presents for treatment of an open ulcer due to venous insufficiency Electronic Signature(s) Signed: 10/08/2023 9:54:44 AM By: Evan Guess MD [name] Previous Signature: 10/08/2023 8:59:49 AM Version By: Evan Guess MD [name] Entered By: Evan [name] on 10/08/2023 09:54:44 -------------------------------------------------------------------------------- Debridement Details Patient Name: Date of Service: Evan [name], [place] Evan [name]. 10/08/2023 9:00 A M Medical Record Number: [id number] Patient Account Number: [id number] Date of Birth/Sex: Treating RN: 12-Jun-1971 (52 y.o. Evan [name] Primary Care Provider: Jiles [name] Other Clinician: Referring Provider: Treating Provider/Extender: Evan [name] Evan [name], Evan [name] in Treatment: 0 Debridement Performed for Assessment: Wound #2 Right,Anterior Lower Leg Performed By: Physician Evan Guess, MD The following information was scribed by: Evan [name] The information was scribed for: Evan [name] Debridement Type: Debridement Severity of Tissue Pre Debridement: Fat layer exposed Level of Consciousness (Pre-procedure): Awake and Alert Pre-procedure Verification/Time Out Yes - 09:50 Taken: Start Time: 09:51 Pain Control: Lidocaine 4% Topical Solution Percent of Wound Bed Debrided: 100% T Area Debrided (cm): otal 1.1 Tissue and other material debrided:  Non-Viable, Eschar, Slough, Slough Level: Non-Viable Tissue Debridement Description: Selective/Open Wound Instrument: Curette Bleeding: Minimum Hemostasis Achieved: Pressure Procedural Pain: 0 Post Procedural Pain: 0 Response to Treatment: Procedure was tolerated well Level of Consciousness (Post- Awake and Alert procedure): Post Debridement Measurements of Total Wound Length: (cm) 1 Width: (cm) 1.4 Depth: (cm) 0.1 Evan [name], Evan [name] ([id number]) [id number] Page 2 of 11 Volume: (cm) 0.11 Character of Wound/Ulcer Post Debridement: Improved Severity of Tissue Post Debridement: Fat layer exposed Post Procedure Diagnosis Same as Pre-procedure Electronic Signature(s) Signed: 10/08/2023 10:00:44 AM By: Evan Guess MD [name] Signed: 10/08/2023 4:01:24 PM By: Evan Deed RN, BSN Entered By: Evan [name] on 10/08/2023 09:52:35 -------------------------------------------------------------------------------- Debridement Details Patient Name: Date of Service: Evan [name], [place] Evan [name]. 10/08/2023 9:00 A M Medical Record Number: [id number] Patient Account Number: [id number] Date of Birth/Sex: Treating RN: July 08, 1971 (52 y.o. Evan [name] Primary Care Provider: Jiles [name] Other Clinician: Referring Provider: Treating Provider/Extender: Evan [name] Evan [name], Evan [name] in Treatment: 0 Debridement Performed for Assessment: Wound #1 Right,Lateral Lower Leg Performed By: Physician Evan Guess, MD The following information was scribed by: Evan [name] The information was scribed for: Evan [name] Debridement Type: Debridement Severity of Tissue Pre Debridement: Fat layer exposed Level of Consciousness (Pre-procedure): Awake and Alert Pre-procedure Verification/Time Out Yes - 09:50 Taken: Start Time: 09:51 Pain Control: Lidocaine 4% Topical Solution Percent of Wound Bed Debrided: 100% T Area Debrided (cm): otal 1.13 Tissue  and other material debrided: Non-Viable, Eschar, Slough, Slough Level: Non-Viable Tissue Debridement Description: Selective/Open Wound Instrument: Curette Bleeding: Minimum Hemostasis Achieved: Pressure Procedural Pain: 0 Post Procedural Pain: 0 Response to Treatment: Procedure was tolerated well Level of Consciousness (Post- Awake and Alert procedure): Post Debridement Measurements of Total Wound Length: (cm) 1.6 Width: (cm) 0.9 Depth: (cm) 0.1 Volume: (cm) 0.113 Character of Wound/Ulcer Post Debridement: Improved Severity of Tissue Post Debridement: Fat layer exposed Post Procedure Diagnosis Same as Pre-procedure Electronic Signature(s) Signed: 10/08/2023 10:00:44 AM By: Evan Guess MD [name] Signed: 10/08/2023 4:01:24 PM By: Evan Deed RN, BSN  Birth/Sex: Treating RN: May 31, 1971 (52 y.o. Evan [name] Primary Care Provider: Jiles [name] Other Clinician: Referring Provider: Treating Provider/Extender: Evan [name] Evan [name], Evan [name] in Treatment: 0 Information Obtained From Patient Chart Constitutional Symptoms (General Health) Complaints and Symptoms: Negative for: Fatigue; Fever; Chills; Marked Weight Change Medical History: Past Medical History Notes: obesity Eyes CALI, CUARTAS ([id number]) [id number] Page 9 of 11 Complaints and Symptoms: Negative for: Dry Eyes; Vision Changes; Glasses / Contacts Ear/Nose/Mouth/Throat Complaints and Symptoms: Negative for: Chronic sinus problems or rhinitis Respiratory Complaints and Symptoms: Negative for: Chronic or frequent coughs; Shortness of Breath Cardiovascular Complaints and Symptoms: Negative for: Chest pain Medical History: Positive for: Hypertension; Peripheral Venous Disease Gastrointestinal Complaints and Symptoms: Positive for: Frequent diarrhea Negative for: Nausea; Vomiting Medical History: Positive for: Hepatitis [name] Past Medical History Notes: liver necrosis, chronic hepatitis Endocrine Complaints and Symptoms: Negative for: Heat/cold intolerance Genitourinary Complaints and Symptoms: Negative for: Frequent urination Medical History: Negative for: End Stage Renal Disease Past Medical History Notes: erectile dysfunction Integumentary (Skin) Complaints and Symptoms: Positive for: Wounds - right leg Medical History: Negative for: History of Burn Past Medical History Notes: eczema Musculoskeletal Complaints and Symptoms: Positive for: Muscle Weakness - right leg Negative for: Muscle Pain Medical History: Past Medical History Notes: hx right tibial fx, right knee meniscus  tear, septic arthritis of right knee Neurologic Complaints and Symptoms: Positive for: Numbness/parasthesias Medical History: Positive for: Neuropathy Psychiatric Complaints and Symptoms: Negative for: Claustrophobia Medical History: Negative for: Anorexia/bulimia; Confinement Anxiety Past Medical History NotesJASSEN, Evan [name] ([id number]) [phone] Page 10 of 11 hx alcohol abuse, ADD, panic disorder Hematologic/Lymphatic Medical History: Positive for: Lymphedema - right arm Immunological Oncologic Medical History: Positive for: Received Chemotherapy; Received Radiation Past Medical History Notes: metastatic melanoma Immunizations Pneumococcal Vaccine: Received Pneumococcal Vaccination: No Implantable Devices None Hospitalization / Surgery History Type of Hospitalization/Surgery IandD right knee 04/13/2019 hardware removal right tibia 04/17/2019 ORIF tibia plateau 01/20/2019 lymphadenectomy left left shoulder surgery Family and Social History Cancer: Yes - Paternal Grandparents; Diabetes: No; Heart Disease: Yes - Mother; Hereditary Spherocytosis: No; Hypertension: Yes - Father; Kidney Disease: No; Lung Disease: No; Seizures: No; Stroke: Yes - Maternal Grandparents; Thyroid Problems: No; Tuberculosis: No; Former smoker - quit over 20 yrs ago; Marital Status - Divorced; Alcohol Use: Never; Drug Use: No History; Caffeine Use: Moderate - tea; Financial Concerns: Yes - medicaid, applying for disability; Food, [employer] fast streamer or Shelter Needs: No; Support System Lacking: No; Transportation Concerns: Yes - uses [employer], [employer]) Signed: 10/08/2023 10:00:44 AM By: Evan Guess MD [name] Signed: 10/08/2023 4:01:24 PM By: Evan Deed RN, BSN Entered By: Evan [name] on 10/08/2023 09:49:20 -------------------------------------------------------------------------------- SuperBill Details Patient Name: Date of Service: Evan [name], [place] Evan [name]. 10/08/2023 Medical Record Number: [id number] Patient Account Number: [id number] Date of Birth/Sex: Treating RN: July 17, 1971 (52 y.o. M) Primary Care Provider: Jiles [name] Other Clinician: Referring Provider: Treating Provider/Extender: Evan [name] Evan [name], Evan [name] in Treatment: 0 Diagnosis Coding ICD-10 Codes Code Description [phone] Non-pressure chronic ulcer of other part of right lower leg with fat layer exposed I87.2 Venous insufficiency (chronic) (peripheral) C43.9 Malignant melanoma of skin, unspecified Facility Procedures : CPT4 Code: [id number] Description: 99213 - WOUND CARE VISIT-LEV 3 EST PT Modifier: 25 Quantity: 1 : Evan [name], Evan [name] Code: [id number] Rushton [name] ([id number] I Description: 97597 - DEBRIDE WOUND 1ST 20 SQ CM OR < 6) [id number] CD-10 Diagnosis Description L97.812 Non-pressure chronic ulcer of other part of right lower leg with fat layer expos Modifier: [id number] ed Quantity: 1  Evan [name], Evan [name] ([id number]) [id number] Page 1 of 11 Visit Report for 10/08/2023 Chief Complaint Document Details Patient Name: Date of Service: Evan [name] 10/08/2023 9:00 A M Medical Record Number: [id number] Patient Account Number: [id number] Date of Birth/Sex: Treating RN: [birth year] (52 y.o. M) Primary Care Provider: Jiles [name] Other Clinician: Referring Provider: Treating Provider/Extender: Evan [name] Evan [name], Evan [name] in Treatment: 0 Information Obtained from: Patient Chief Complaint Patient presents for treatment of an open ulcer due to venous insufficiency Electronic Signature(s) Signed: 10/08/2023 9:54:44 AM By: Evan Guess MD [name] Previous Signature: 10/08/2023 8:59:49 AM Version By: Evan Guess MD [name] Entered By: Evan [name] on 10/08/2023 09:54:44 -------------------------------------------------------------------------------- Debridement Details Patient Name: Date of Service: Evan [name][place] Evan [name]. 10/08/2023 9:00 A M Medical Record Number: [id number] Patient Account Number: [id number] Date of Birth/Sex: Treating RN: 12-Jun-1971 (52 y.o. Evan [name] Primary Care Provider: Jiles [name] Other Clinician: Referring Provider: Treating Provider/Extender: Evan [name] Evan [name], Evan [name] in Treatment: 0 Debridement Performed for Assessment: Wound #2 Right,Anterior Lower Leg Performed By: Physician Evan Guess, MD The following information was scribed by: Evan [name] The information was scribed for: Evan [name] Debridement Type: Debridement Severity of Tissue Pre Debridement: Fat layer exposed Level of Consciousness (Pre-procedure): Awake and Alert Pre-procedure Verification/Time Out Yes - 09:50 Taken: Start Time: 09:51 Pain Control: Lidocaine 4% Topical Solution Percent of Wound Bed Debrided: 100% T Area Debrided (cm): otal 1.1 Tissue and other material debrided:  Non-Viable, Eschar, Slough, Slough Level: Non-Viable Tissue Debridement Description: Selective/Open Wound Instrument: Curette Bleeding: Minimum Hemostasis Achieved: Pressure Procedural Pain: 0 Post Procedural Pain: 0 Response to Treatment: Procedure was tolerated well Level of Consciousness (Post- Awake and Alert procedure): Post Debridement Measurements of Total Wound Length: (cm) 1 Width: (cm) 1.4 Depth: (cm) 0.1 Evan [name], Evan [name] ([id number]) [id number] Page 2 of 11 Volume: (cm) 0.11 Character of Wound/Ulcer Post Debridement: Improved Severity of Tissue Post Debridement: Fat layer exposed Post Procedure Diagnosis Same as Pre-procedure Electronic Signature(s) Signed: 10/08/2023 10:00:44 AM By: Evan Guess MD [name] Signed: 10/08/2023 4:01:24 PM By: Evan Deed RN, BSN Entered By: Evan [name] on 10/08/2023 09:52:35 -------------------------------------------------------------------------------- Debridement Details Patient Name: Date of Service: Evan [name], [place] Evan [name]. 10/08/2023 9:00 A M Medical Record Number: [id number] Patient Account Number: [id number] Date of Birth/Sex: Treating RN: July 08, 1971 (52 y.o. Evan [name] Primary Care Provider: Jiles [name] Other Clinician: Referring Provider: Treating Provider/Extender: Evan [name] Evan [name], Evan [name] in Treatment: 0 Debridement Performed for Assessment: Wound #1 Right,Lateral Lower Leg Performed By: Physician Evan Guess, MD The following information was scribed by: Evan [name] The information was scribed for: Evan [name] Debridement Type: Debridement Severity of Tissue Pre Debridement: Fat layer exposed Level of Consciousness (Pre-procedure): Awake and Alert Pre-procedure Verification/Time Out Yes - 09:50 Taken: Start Time: 09:51 Pain Control: Lidocaine 4% Topical Solution Percent of Wound Bed Debrided: 100% T Area Debrided (cm): otal 1.13 Tissue  and other material debrided: Non-Viable, Eschar, Slough, Slough Level: Non-Viable Tissue Debridement Description: Selective/Open Wound Instrument: Curette Bleeding: Minimum Hemostasis Achieved: Pressure Procedural Pain: 0 Post Procedural Pain: 0 Response to Treatment: Procedure was tolerated well Level of Consciousness (Post- Awake and Alert procedure): Post Debridement Measurements of Total Wound Length: (cm) 1.6 Width: (cm) 0.9 Depth: (cm) 0.1 Volume: (cm) 0.113 Character of Wound/Ulcer Post Debridement: Improved Severity of Tissue Post Debridement: Fat layer exposed Post Procedure Diagnosis Same as Pre-procedure Electronic Signature(s) Signed: 10/08/2023 10:00:44 AM By: Evan Guess MD [name] Signed: 10/08/2023 4:01:24 PM By: Evan Deed RN, BSN  Evan [name], Evan [name] ([id number]) [id number] Page 1 of 11 Visit Report for 10/08/2023 Chief Complaint Document Details Patient Name: Date of Service: Evan [name] 10/08/2023 9:00 A M Medical Record Number: [id number] Patient Account Number: [id number] Date of Birth/Sex: Treating RN: [birth year] (52 y.o. M) Primary Care Provider: Jiles [name] Other Clinician: Referring Provider: Treating Provider/Extender: Evan [name] Evan [name], Evan [name] in Treatment: 0 Information Obtained from: Patient Chief Complaint Patient presents for treatment of an open ulcer due to venous insufficiency Electronic Signature(s) Signed: 10/08/2023 9:54:44 AM By: Evan Guess MD [name] Previous Signature: 10/08/2023 8:59:49 AM Version By: Evan Guess MD [name] Entered By: Evan [name] on 10/08/2023 09:54:44 -------------------------------------------------------------------------------- Debridement Details Patient Name: Date of Service: Evan [name], [place] Evan [name]. 10/08/2023 9:00 A M Medical Record Number: [id number] Patient Account Number: [id number] Date of Birth/Sex: Treating RN: 12-Jun-1971 (52 y.o. Evan [name] Primary Care Provider: Jiles [name] Other Clinician: Referring Provider: Treating Provider/Extender: Evan [name] Evan [name], Evan [name] in Treatment: 0 Debridement Performed for Assessment: Wound #2 Right,Anterior Lower Leg Performed By: Physician Evan Guess, MD The following information was scribed by: Evan [name] The information was scribed for: Evan [name] Debridement Type: Debridement Severity of Tissue Pre Debridement: Fat layer exposed Level of Consciousness (Pre-procedure): Awake and Alert Pre-procedure Verification/Time Out Yes - 09:50 Taken: Start Time: 09:51 Pain Control: Lidocaine 4% Topical Solution Percent of Wound Bed Debrided: 100% T Area Debrided (cm): otal 1.1 Tissue and other material debrided:  Non-Viable, Eschar, Slough, Slough Level: Non-Viable Tissue Debridement Description: Selective/Open Wound Instrument: Curette Bleeding: Minimum Hemostasis Achieved: Pressure Procedural Pain: 0 Post Procedural Pain: 0 Response to Treatment: Procedure was tolerated well Level of Consciousness (Post- Awake and Alert procedure): Post Debridement Measurements of Total Wound Length: (cm) 1 Width: (cm) 1.4 Depth: (cm) 0.1 Evan [name], Evan [name] ([id number]) [id number] Page 2 of 11 Volume: (cm) 0.11 Character of Wound/Ulcer Post Debridement: Improved Severity of Tissue Post Debridement: Fat layer exposed Post Procedure Diagnosis Same as Pre-procedure Electronic Signature(s) Signed: 10/08/2023 10:00:44 AM By: Evan Guess MD [name] Signed: 10/08/2023 4:01:24 PM By: Evan Deed RN, BSN Entered By: Evan [name] on 10/08/2023 09:52:35 -------------------------------------------------------------------------------- Debridement Details Patient Name: Date of Service: Evan [name], [place] Evan [name]. 10/08/2023 9:00 A M Medical Record Number: [id number] Patient Account Number: [id number] Date of Birth/Sex: Treating RN: July 08, 1971 (52 y.o. Evan [name] Primary Care Provider: Jiles [name] Other Clinician: Referring Provider: Treating Provider/Extender: Evan [name] Evan [name], Evan [name] in Treatment: 0 Debridement Performed for Assessment: Wound #1 Right,Lateral Lower Leg Performed By: Physician Evan Guess, MD The following information was scribed by: Evan [name] The information was scribed for: Evan [name] Debridement Type: Debridement Severity of Tissue Pre Debridement: Fat layer exposed Level of Consciousness (Pre-procedure): Awake and Alert Pre-procedure Verification/Time Out Yes - 09:50 Taken: Start Time: 09:51 Pain Control: Lidocaine 4% Topical Solution Percent of Wound Bed Debrided: 100% T Area Debrided (cm): otal 1.13 Tissue  and other material debrided: Non-Viable, Eschar, Slough, Slough Level: Non-Viable Tissue Debridement Description: Selective/Open Wound Instrument: Curette Bleeding: Minimum Hemostasis Achieved: Pressure Procedural Pain: 0 Post Procedural Pain: 0 Response to Treatment: Procedure was tolerated well Level of Consciousness (Post- Awake and Alert procedure): Post Debridement Measurements of Total Wound Length: (cm) 1.6 Width: (cm) 0.9 Depth: (cm) 0.1 Volume: (cm) 0.113 Character of Wound/Ulcer Post Debridement: Improved Severity of Tissue Post Debridement: Fat layer exposed Post Procedure Diagnosis Same as Pre-procedure Electronic Signature(s) Signed: 10/08/2023 10:00:44 AM By: Evan Guess MD [name] Signed: 10/08/2023 4:01:24 PM By: Evan Deed RN, BSN  plastic bag) or a cast cover and may then take shower. Edema Control - Lymphedema / SCD / Other Elevate legs to the level of the heart or above for 30 minutes daily and/or when sitting for 3-4 times a day throughout the day. - throughout the day Avoid standing for long periods of time. Exercise regularly Compression stocking or Garment 20-30 mm/Hg pressure to: - to left leg daily Wound Treatment Wound #1 - Lower Leg Wound Laterality: Right, Lateral Peri-Wound Care: Sween Lotion (Moisturizing lotion) 1 x Per Week/30 Days Discharge Instructions: Apply moisturizing lotion as directed Prim Dressing: Maxorb Extra Ag+ Alginate Dressing, 2x2 (in/in) 1 x Per Week/30 Days ary Discharge Instructions: Apply to wound bed as  instructed Secondary Dressing: Woven Gauze Sponge, Non-Sterile 4x4 in 1 x Per Week/30 Days Discharge Instructions: Apply over primary dressing as directed. Compression Wrap: Urgo K2 Lite, (equivalent to a 3 layer) two layer compression system, regular 1 x Per Week/30 Days Discharge Instructions: Apply Urgo K2 Lite as directed (alternative to 3 layer compression). Wound #2 - Lower Leg Wound Laterality: Right, Anterior Peri-Wound Care: Sween Lotion (Moisturizing lotion) 1 x Per Week/30 Days Discharge Instructions: Apply moisturizing lotion as directed Prim Dressing: Maxorb Extra Ag+ Alginate Dressing, 2x2 (in/in) 1 x Per Week/30 Days ary Discharge Instructions: Apply to wound bed as instructed Secondary Dressing: Woven Gauze Sponge, Non-Sterile 4x4 in 1 x Per Week/30 Days Discharge Instructions: Apply over primary dressing as directed. Compression Wrap: Urgo K2 Lite, (equivalent to a 3 layer) two layer compression system, regular 1 x Per Week/30 Days Discharge Instructions: Apply Urgo K2 Lite as directed (alternative to 3 layer compression). Patient Medications llergies: Iodinated Contrast Media, Fish Containing Products A Notifications Medication Indication Start End prior to debridement 10/08/2023 lidocaine DOSE topical 4 % cream - cream topical Evan [name], Evan [name] ([id number]) [phone].pdf Page 5 of 11 Electronic Signature(s) Signed: 10/08/2023 10:00:44 AM By: Evan Guess MD [name] Entered By: Evan [name] on 10/08/2023 09:58:57 -------------------------------------------------------------------------------- Problem List Details Patient Name: Date of Service: Evan [name], [place] Evan [name]. 10/08/2023 9:00 A M Medical Record Number: [id number] Patient Account Number: [id number] Date of Birth/Sex: Treating RN: 1971/12/18 (52 y.o. M) Primary Care Provider: Jiles [name] Other Clinician: Referring Provider: Treating Provider/Extender: Evan [name] Evan [name],  Evan [name] in Treatment: 0 Active Problems ICD-10 Encounter Code Description Active Date MDM Diagnosis [phone] Non-pressure chronic ulcer of other part of right lower leg with fat layer 10/08/2023 No Yes exposed I87.2 Venous insufficiency (chronic) (peripheral) 10/08/2023 No Yes C43.9 Malignant melanoma of skin, unspecified 10/08/2023 No Yes Inactive Problems Resolved Problems Electronic Signature(s) Signed: 10/08/2023 8:59:39 AM By: Evan Guess MD [name] Previous Signature: 10/08/2023 8:47:25 AM Version By: Evan Guess MD [name] Entered By: Evan [name] on 10/08/2023 08:59:38 -------------------------------------------------------------------------------- Progress Note Details Patient Name: Date of Service: Evan [name], [place] Evan [name]. 10/08/2023 9:00 A M Medical Record Number: [id number] Patient Account Number: [id number] Date of Birth/Sex: Treating RN: [birth year] (52 y.o. 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 He

## 2023-10-08 NOTE — Progress Notes (Signed)
52 y.o. Damaris Schooner Primary Care Lizbeth Feijoo: Jiles Prows Other Clinician: Referring Rejoice Heatwole: Treating Vashti Bolanos/Extender: Duanne Guess Heilingoetter, Cassandra Weeks in Treatment: [address] KASSEM, KIBBE ([id number]) [id number] Page 7 of 11 Location of Pain Severity and Description of Pain Patient Has Paino Yes Site Locations Pain Location: Pain in Ulcers With Dressing Change: Yes Duration of the Pain. Constant / Intermittento Intermittent Rate the pain. Current Pain Level: 0 Worst Pain Level: 2 Least Pain Level: 0 Character of Pain Describe the Pain: Aching, Tender Pain Management and Medication Current Pain Management: Is the Current Pain Management Adequate: Adequate How does your wound impact your activities of daily livingo Sleep: No Bathing: No Appetite: No Relationship With Others: No Bladder Continence: No Emotions: No Bowel Continence: No Work: No Toileting: No Drive: No Dressing: No Hobbies: No Electronic Signature(s) Signed: 10/08/2023 4:01:24 PM By: Zenaida Deed RN, BSN Entered By: Zenaida Deed on 10/08/2023 09:41:03 -------------------------------------------------------------------------------- Patient/Caregiver Education Details Patient Name: Date of Service: [name], [name] 10/15/2024andnbsp9:00 A M Medical Record Number: [id number] Patient Account Number: [id number] Date of Birth/Gender: Treating RN: 11/25/1971 (52 y.o. Damaris Schooner Primary Care Physician: Jiles Prows Other Clinician: Referring Physician: Treating Physician/Extender: Duanne Guess Heilingoetter, Charletta Cousin in Treatment: 0 Education Assessment Education Provided To: Patient Education Topics Provided Venous: Handouts:  Controlling Swelling with Compression Stockings, Management of Venous Insufficiency Methods: Explain/Verbal, Printed Responses: Reinforcements needed, State content correctly [facility] Packet: o Handouts: Nutrition, [facility] o Methods: Explain/Verbal, Printed Responses: Reinforcements needed, State content correctly BENITO, LEMMERMAN ([id number]) [id number] Page 8 of 11 Electronic Signature(s) Signed: 10/08/2023 4:01:24 PM By: Zenaida Deed RN, BSN Entered By: Zenaida Deed on 10/08/2023 10:05:25 -------------------------------------------------------------------------------- Wound Assessment Details Patient Name: Date of Service: [name], [name] 10/08/2023 9:00 A M Medical Record Number: [id number] Patient Account Number: [id number] Date of Birth/Sex: Treating RN: 17-Feb-1971 (52 y.o. Damaris Schooner Primary Care Jaidon Ellery: Jiles Prows Other Clinician: Referring Ashonte Angelucci: Treating Amonie Wisser/Extender: Duanne Guess Heilingoetter, Cassandra Weeks in Treatment: 0 Wound Status Wound Number: 1 Primary Venous Leg Ulcer Etiology: Wound Location: Right, Lateral Lower Leg Wound Open Wounding Event: Trauma Status: Date Acquired: 09/07/2020 Comorbid Hypertension, Peripheral Venous Disease, Hepatitis C, Weeks Of Treatment: 0 History: Neuropathy, Received Chemotherapy Clustered Wound: No Photos Wound Measurements Length: (cm) 1.6 Width: (cm) 0.9 Depth: (cm) 0.1 Area: (cm) 1.131 Volume: (cm) 0.113 % Reduction in Area: % Reduction in Volume: Epithelialization: Small (1-33%) Tunneling: No Undermining: No Wound Description Classification: Full Thickness Without Exposed Support Structures Wound Margin: Flat and Intact Exudate Amount: Medium Exudate Type: Serosanguineous Exudate Color: red, brown Foul Odor After Cleansing: No Slough/Fibrino Yes Wound Bed Granulation Amount: Medium  (34-66%) Exposed Structure Granulation Quality: Red Fascia Exposed: No Necrotic Amount: Medium (34-66%) Fat Layer (Subcutaneous Tissue) Exposed: Yes Necrotic Quality: Eschar Tendon Exposed: No Muscle Exposed: No Joint Exposed: No Bone Exposed: No Periwound Skin Texture Texture Color No Abnormalities Noted: Yes No Abnormalities Noted: No Hemosiderin Staining: Yes Moisture No Abnormalities Noted: No Temperature / Pain JESTIN, BURBACH C ([id number]) [id number].pdf Page 9 of 11 Dry / Scaly: Yes Temperature: No Abnormality Maceration: No Tenderness on Palpation: Yes Treatment Notes Wound #1 (Lower Leg) Wound Laterality: Right, Lateral Cleanser Peri-Wound Care Sween Lotion (Moisturizing lotion) Discharge Instruction: Apply moisturizing lotion as directed Topical Primary Dressing Maxorb Extra Ag+ Alginate Dressing, 2x2 (in/in) Discharge Instruction: Apply to wound bed as instructed Secondary Dressing Woven Gauze Sponge, Non-Sterile 4x4 in Discharge Instruction: Apply  52 y.o. Damaris Schooner Primary Care Lizbeth Feijoo: Jiles Prows Other Clinician: Referring Rejoice Heatwole: Treating Vashti Bolanos/Extender: Duanne Guess Heilingoetter, Cassandra Weeks in Treatment: [address] KASSEM, KIBBE ([id number]) [id number] Page 7 of 11 Location of Pain Severity and Description of Pain Patient Has Paino Yes Site Locations Pain Location: Pain in Ulcers With Dressing Change: Yes Duration of the Pain. Constant / Intermittento Intermittent Rate the pain. Current Pain Level: 0 Worst Pain Level: 2 Least Pain Level: 0 Character of Pain Describe the Pain: Aching, Tender Pain Management and Medication Current Pain Management: Is the Current Pain Management Adequate: Adequate How does your wound impact your activities of daily livingo Sleep: No Bathing: No Appetite: No Relationship With Others: No Bladder Continence: No Emotions: No Bowel Continence: No Work: No Toileting: No Drive: No Dressing: No Hobbies: No Electronic Signature(s) Signed: 10/08/2023 4:01:24 PM By: Zenaida Deed RN, BSN Entered By: Zenaida Deed on 10/08/2023 09:41:03 -------------------------------------------------------------------------------- Patient/Caregiver Education Details Patient Name: Date of Service: [name], [name] 10/15/2024andnbsp9:00 A M Medical Record Number: [id number] Patient Account Number: [id number] Date of Birth/Gender: Treating RN: 11/25/1971 (52 y.o. Damaris Schooner Primary Care Physician: Jiles Prows Other Clinician: Referring Physician: Treating Physician/Extender: Duanne Guess Heilingoetter, Charletta Cousin in Treatment: 0 Education Assessment Education Provided To: Patient Education Topics Provided Venous: Handouts:  Controlling Swelling with Compression Stockings, Management of Venous Insufficiency Methods: Explain/Verbal, Printed Responses: Reinforcements needed, State content correctly Welcome T [facility] Packet: o Handouts: Nutrition, [facility] o Methods: Explain/Verbal, Printed Responses: Reinforcements needed, State content correctly BENITO, LEMMERMAN ([id number]) [id number] Page 8 of 11 Electronic Signature(s) Signed: 10/08/2023 4:01:24 PM By: Zenaida Deed RN, BSN Entered By: Zenaida Deed on 10/08/2023 10:05:25 -------------------------------------------------------------------------------- Wound Assessment Details Patient Name: Date of Service: [name], [name] 10/08/2023 9:00 A M Medical Record Number: [id number] Patient Account Number: [id number] Date of Birth/Sex: Treating RN: 17-Feb-1971 (52 y.o. Damaris Schooner Primary Care Jaidon Ellery: Jiles Prows Other Clinician: Referring Ashonte Angelucci: Treating Amonie Wisser/Extender: Duanne Guess Heilingoetter, Cassandra Weeks in Treatment: 0 Wound Status Wound Number: 1 Primary Venous Leg Ulcer Etiology: Wound Location: Right, Lateral Lower Leg Wound Open Wounding Event: Trauma Status: Date Acquired: 09/07/2020 Comorbid Hypertension, Peripheral Venous Disease, Hepatitis C, Weeks Of Treatment: 0 History: Neuropathy, Received Chemotherapy Clustered Wound: No Photos Wound Measurements Length: (cm) 1.6 Width: (cm) 0.9 Depth: (cm) 0.1 Area: (cm) 1.131 Volume: (cm) 0.113 % Reduction in Area: % Reduction in Volume: Epithelialization: Small (1-33%) Tunneling: No Undermining: No Wound Description Classification: Full Thickness Without Exposed Support Structures Wound Margin: Flat and Intact Exudate Amount: Medium Exudate Type: Serosanguineous Exudate Color: red, brown Foul Odor After Cleansing: No Slough/Fibrino Yes Wound Bed Granulation Amount: Medium  (34-66%) Exposed Structure Granulation Quality: Red Fascia Exposed: No Necrotic Amount: Medium (34-66%) Fat Layer (Subcutaneous Tissue) Exposed: Yes Necrotic Quality: Eschar Tendon Exposed: No Muscle Exposed: No Joint Exposed: No Bone Exposed: No Periwound Skin Texture Texture Color No Abnormalities Noted: Yes No Abnormalities Noted: No Hemosiderin Staining: Yes Moisture No Abnormalities Noted: No Temperature / Pain JESTIN, BURBACH C ([id number]) [id number] Page 9 of 11 Dry / Scaly: Yes Temperature: No Abnormality Maceration: No Tenderness on Palpation: Yes Treatment Notes Wound #1 (Lower Leg) Wound Laterality: Right, Lateral Cleanser Peri-Wound Care Sween Lotion (Moisturizing lotion) Discharge Instruction: Apply moisturizing lotion as directed Topical Primary Dressing Maxorb Extra Ag+ Alginate Dressing, 2x2 (in/in) Discharge Instruction: Apply to wound bed as instructed Secondary Dressing Woven Gauze Sponge, Non-Sterile 4x4 in Discharge Instruction: Apply  over primary dressing as directed. Secured With Compression Wrap Urgo K2 Lite, (equivalent to a 3 layer) two layer compression system, regular Discharge Instruction: Apply Urgo K2 Lite as directed (alternative to 3 layer compression). Compression Stockings Add-Ons Electronic Signature(s) Signed: 10/08/2023 4:01:24 PM By: Zenaida Deed RN, BSN Entered By: Zenaida Deed on 10/08/2023 09:35:49 -------------------------------------------------------------------------------- Wound Assessment Details Patient Name: Date of Service: [name], [name] 10/08/2023 9:00 A M Medical Record Number: [id number] Patient Account Number: [id number] Date of Birth/Sex: Treating RN: 1971-07-22 (52 y.o. Damaris Schooner Primary Care Tammala Weider: Jiles Prows Other Clinician: Referring Trinaty Bundrick: Treating Jacon Whetzel/Extender: Duanne Guess Heilingoetter, Cassandra Weeks in Treatment: 0 Wound Status Wound  Number: 2 Primary Venous Leg Ulcer Etiology: Wound Location: Right, Anterior Lower Leg Wound Open Wounding Event: Trauma Status: Date Acquired: 09/07/2020 Comorbid Hypertension, Peripheral Venous Disease, Hepatitis C, Weeks Of Treatment: 0 History: Neuropathy, Received Chemotherapy Clustered Wound: No Photos OAKES, MCCREADY ([id number]) [id number] Page 10 of 11 Wound Measurements Length: (cm) Width: (cm) Depth: (cm) Area: (cm) Volume: (cm) 1 % Reduction in Area: 1.4 % Reduction in Volume: 0.1 Epithelialization: Small (1-33%) 1.1 Tunneling: No 0.11 Undermining: No Wound Description Classification: Full Thickness Without Exposed Support Structures Wound Margin: Flat and Intact Exudate Amount: Medium Exudate Type: Serosanguineous Exudate Color: red, brown Foul Odor After Cleansing: No Slough/Fibrino Yes Wound Bed Granulation Amount: Large (67-100%) Exposed Structure Granulation Quality: Red Fascia Exposed: No Necrotic Amount: Small (1-33%) Fat Layer (Subcutaneous Tissue) Exposed: Yes Necrotic Quality: Adherent Slough Tendon Exposed: No Muscle Exposed: No Joint Exposed: No Bone Exposed: No Periwound Skin Texture Texture Color No Abnormalities Noted: Yes No Abnormalities Noted: No Hemosiderin Staining: Yes Moisture No Abnormalities Noted: No Temperature / Pain Dry / Scaly: Yes Temperature: No Abnormality Maceration: No Tenderness on Palpation: Yes Treatment Notes Wound #2 (Lower Leg) Wound Laterality: Right, Anterior Cleanser Peri-Wound Care Sween Lotion (Moisturizing lotion) Discharge Instruction: Apply moisturizing lotion as directed Topical Primary Dressing Maxorb Extra Ag+ Alginate Dressing, 2x2 (in/in) Discharge Instruction: Apply to wound bed as instructed Secondary Dressing Woven Gauze Sponge, Non-Sterile 4x4 in Discharge Instruction: Apply over primary dressing as directed. Secured With Compression Wrap Urgo K2 Lite,  (equivalent to a 3 layer) two layer compression system, regular Discharge Instruction: Apply Urgo K2 Lite as directed (alternative to 3 layer compression). Compression Stockings Add-Ons Electronic Signature(s) Signed: 10/08/2023 4:01:24 PM By: Zenaida Deed RN, BSN Entered By: Zenaida Deed on 10/08/2023 09:38:40 Vitals Details -------------------------------------------------------------------------------- [name] ([id number]) [id number] Page 11 of 11 Patient Name: Date of Service: [name], [place] 10/08/2023 9:00 A M Medical Record Number: [id number] Patient Account Number: [id number] Date of Birth/Sex: Treating RN: 12/05/1971 (52 y.o. Damaris Schooner Primary Care Lajoy Vanamburg: Jiles Prows Other Clinician: Referring Shavaun Osterloh: Treating Askia Hazelip/Extender: Duanne Guess Heilingoetter, Cassandra Weeks in Treatment: 0 Vital Signs Time Taken: 08:59 Temperature (F): 99 Height (in): 75 Pulse (bpm): 80 Source: Stated Respiratory Rate (breaths/min): 18 Weight (lbs): 260 Blood Pressure (mmHg): 114/84 Source: Stated Reference Range: 80 - 120 mg / dl Body Mass Index (BMI): 32.5 Electronic Signature(s) Signed: 10/08/2023 4:01:24 PM By: Zenaida Deed RN, BSN Entered By: Zenaida Deed on 10/08/2023 09:05:20  JAC, ROMULUS ([id number]) [id number] Page 1 of 11 Visit Report for 10/08/2023 Allergy List Details Patient Name: Date of Service: [name] 10/08/2023 9:00 A M Medical Record Number: [id number] Patient Account Number: [id number] Date of Birth/Sex: Treating RN: 1971/01/29 (52 y.o. Damaris Schooner Primary Care Elika Godar: Jiles Prows Other Clinician: Referring Ora Mcnatt: Treating Elvira Langston/Extender: Duanne Guess Heilingoetter, Cassandra Weeks in Treatment: 0 Allergies Active Allergies Iodinated Contrast Media Reaction: SOB, swelling Fish Containing Products Reaction: anaphylaxis Allergy Notes Electronic Signature(s) Signed: 10/08/2023 4:01:24 PM By: Zenaida Deed RN, BSN Entered By: Zenaida Deed on 10/08/2023 09:05:31 -------------------------------------------------------------------------------- Arrival Information Details Patient Name: Date of Service: [name], [place] REY C. 10/08/2023 9:00 A M Medical Record Number: [id number] Patient Account Number: [id number] Date of Birth/Sex: Treating RN: 03-06-1971 (52 y.o. Damaris Schooner Primary Care Emiel Kielty: Jiles Prows Other Clinician: Referring Angeleena Dueitt: Treating Aedan Geimer/Extender: Duanne Guess Heilingoetter, Charletta Cousin in Treatment: 0 Visit Information Patient Arrived: Ambulatory Arrival Time: 08:52 Accompanied By: self Transfer Assistance: None Patient Identification Verified: Yes Secondary Verification Process Completed: Yes Patient Requires Transmission-Based Precautions: No Patient Has Alerts: No Electronic Signature(s) Signed: 10/08/2023 4:01:24 PM By: Zenaida Deed RN, BSN Entered By: Zenaida Deed on 10/08/2023 08:59:05 [name] ([id number]) [id number] Page 2 of 11 -------------------------------------------------------------------------------- Clinic Level of Care Assessment Details Patient Name: Date of Service: [name], [place] 10/08/2023 9:00 A M Medical Record Number: [id number] Patient Account Number: [id number] Date of Birth/Sex: Treating RN: 18-Sep-1971 (52 y.o. Damaris Schooner Primary Care Joslin Doell: Jiles Prows Other Clinician: Referring Don Tiu: Treating Raquan Iannone/Extender: Duanne Guess Heilingoetter, Cassandra Weeks in Treatment: 0 Clinic Level of Care Assessment Items TOOL 1 Quantity Score []  - 0 Use when EandM and Procedure is performed on INITIAL visit ASSESSMENTS - Nursing Assessment / Reassessment X- 1 20 General Physical Exam (combine w/ comprehensive assessment (listed just below) when performed on new pt. evals) X- 1 25 Comprehensive Assessment (HX, ROS, Risk Assessments, Wounds Hx, etc.) ASSESSMENTS - Wound and Skin Assessment / Reassessment []  - 0 Dermatologic / Skin Assessment (not related to wound area) ASSESSMENTS - Ostomy and/or Continence Assessment and Care []  - 0 Incontinence Assessment and Management []  - 0 Ostomy Care Assessment and Management (repouching, etc.) PROCESS - Coordination of Care X - Simple Patient / Family Education for ongoing care 1 15 []  - 0 Complex (extensive) Patient / Family Education for ongoing care X- 1 10 Staff obtains Chiropractor, Records, T Results / Process Orders est []  - 0 Staff telephones HHA, Nursing Homes / Clarify orders / etc []  - 0 Routine Transfer to another Facility (non-emergent condition) []  - 0 Routine Hospital Admission (non-emergent condition) X- 1 15 New Admissions / Manufacturing engineer / Ordering NPWT Apligraf, etc. , []  - 0 Emergency Hospital Admission (emergent condition) PROCESS - Special Needs []  - 0 Pediatric / Minor Patient Management []  - 0 Isolation Patient Management []  - 0 Hearing / Language / Visual special needs []  - 0 Assessment of Community assistance (transportation, D/C planning, etc.) []  - 0 Additional assistance / Altered mentation []  - 0 Support Surface(s) Assessment (bed,  cushion, seat, etc.) INTERVENTIONS - Miscellaneous []  - 0 External ear exam []  - 0 Patient Transfer (multiple staff / Nurse, adult / Similar devices) []  - 0 Simple Staple / Suture removal (25 or less) []  - 0 Complex Staple / Suture removal (26 or more) []  - 0 Hypo/Hyperglycemic Management (do not check if billed separately) []  - 0 Ankle / Brachial Index (ABI) - do  52 y.o. Damaris Schooner Primary Care Lizbeth Feijoo: Jiles Prows Other Clinician: Referring Rejoice Heatwole: Treating Vashti Bolanos/Extender: Duanne Guess Heilingoetter, Cassandra Weeks in Treatment: [address] KASSEM, KIBBE ([id number]) [id number] Page 7 of 11 Location of Pain Severity and Description of Pain Patient Has Paino Yes Site Locations Pain Location: Pain in Ulcers With Dressing Change: Yes Duration of the Pain. Constant / Intermittento Intermittent Rate the pain. Current Pain Level: 0 Worst Pain Level: 2 Least Pain Level: 0 Character of Pain Describe the Pain: Aching, Tender Pain Management and Medication Current Pain Management: Is the Current Pain Management Adequate: Adequate How does your wound impact your activities of daily livingo Sleep: No Bathing: No Appetite: No Relationship With Others: No Bladder Continence: No Emotions: No Bowel Continence: No Work: No Toileting: No Drive: No Dressing: No Hobbies: No Electronic Signature(s) Signed: 10/08/2023 4:01:24 PM By: Zenaida Deed RN, BSN Entered By: Zenaida Deed on 10/08/2023 09:41:03 -------------------------------------------------------------------------------- Patient/Caregiver Education Details Patient Name: Date of Service: [name], [name] 10/15/2024andnbsp9:00 A M Medical Record Number: [id number] Patient Account Number: [id number] Date of Birth/Gender: Treating RN: 11/25/1971 (52 y.o. Damaris Schooner Primary Care Physician: Jiles Prows Other Clinician: Referring Physician: Treating Physician/Extender: Duanne Guess Heilingoetter, Charletta Cousin in Treatment: 0 Education Assessment Education Provided To: Patient Education Topics Provided Venous: Handouts:  Controlling Swelling with Compression Stockings, Management of Venous Insufficiency Methods: Explain/Verbal, Printed Responses: Reinforcements needed, State content correctly [facility] T [facility] Packet: o Handouts: Nutrition, Welcome T [facility] o Methods: Explain/Verbal, Printed Responses: Reinforcements needed, State content correctly BENITO, LEMMERMAN ([id number]) [id number] Page 8 of 11 Electronic Signature(s) Signed: 10/08/2023 4:01:24 PM By: Zenaida Deed RN, BSN Entered By: Zenaida Deed on 10/08/2023 10:05:25 -------------------------------------------------------------------------------- Wound Assessment Details Patient Name: Date of Service: [name], [name] 10/08/2023 9:00 A M Medical Record Number: [id number] Patient Account Number: [id number] Date of Birth/Sex: Treating RN: 17-Feb-1971 (52 y.o. Damaris Schooner Primary Care Jaidon Ellery: Jiles Prows Other Clinician: Referring Ashonte Angelucci: Treating Amonie Wisser/Extender: Duanne Guess Heilingoetter, Cassandra Weeks in Treatment: 0 Wound Status Wound Number: 1 Primary Venous Leg Ulcer Etiology: Wound Location: Right, Lateral Lower Leg Wound Open Wounding Event: Trauma Status: Date Acquired: 09/07/2020 Comorbid Hypertension, Peripheral Venous Disease, Hepatitis C, Weeks Of Treatment: 0 History: Neuropathy, Received Chemotherapy Clustered Wound: No Photos Wound Measurements Length: (cm) 1.6 Width: (cm) 0.9 Depth: (cm) 0.1 Area: (cm) 1.131 Volume: (cm) 0.113 % Reduction in Area: % Reduction in Volume: Epithelialization: Small (1-33%) Tunneling: No Undermining: No Wound Description Classification: Full Thickness Without Exposed Support Structures Wound Margin: Flat and Intact Exudate Amount: Medium Exudate Type: Serosanguineous Exudate Color: red, brown Foul Odor After Cleansing: No Slough/Fibrino Yes Wound Bed Granulation Amount: Medium  (34-66%) Exposed Structure Granulation Quality: Red Fascia Exposed: No Necrotic Amount: Medium (34-66%) Fat Layer (Subcutaneous Tissue) Exposed: Yes Necrotic Quality: Eschar Tendon Exposed: No Muscle Exposed: No Joint Exposed: No Bone Exposed: No Periwound Skin Texture Texture Color No Abnormalities Noted: Yes No Abnormalities Noted: No Hemosiderin Staining: Yes Moisture No Abnormalities Noted: No Temperature / Pain JESTIN, BURBACH C ([id number]) [id number] Page 9 of 11 Dry / Scaly: Yes Temperature: No Abnormality Maceration: No Tenderness on Palpation: Yes Treatment Notes Wound #1 (Lower Leg) Wound Laterality: Right, Lateral Cleanser Peri-Wound Care Sween Lotion (Moisturizing lotion) Discharge Instruction: Apply moisturizing lotion as directed Topical Primary Dressing Maxorb Extra Ag+ Alginate Dressing, 2x2 (in/in) Discharge Instruction: Apply to wound bed as instructed Secondary Dressing Woven Gauze Sponge, Non-Sterile 4x4 in Discharge Instruction: Apply

## 2023-10-21 ENCOUNTER — Telehealth: Payer: Self-pay | Admitting: Nurse Practitioner

## 2023-10-21 ENCOUNTER — Encounter (HOSPITAL_BASED_OUTPATIENT_CLINIC_OR_DEPARTMENT_OTHER): Payer: MEDICAID | Admitting: General Surgery

## 2023-10-21 DIAGNOSIS — I872 Venous insufficiency (chronic) (peripheral): Secondary | ICD-10-CM | POA: Diagnosis not present

## 2023-10-21 NOTE — Progress Notes (Signed)
Exercise, Compression and Elevation: Exercise daily as tolerated. (Walking, ROM, Calf Pumps and T T oe aps) Compression Wraps as ordered Elevate legs 30 - 60 minutes at or above heart level at least 3 - 4 times daily as able/tolerated Avoid standing for long periods and elevate leg(s) parallel to the floor when sitting The following medication(s) was prescribed: lidocaine topical 4 % cream cream topical was prescribed at facility WOUND #1: - Lower Leg Wound Laterality: Right, Lateral Cleanser: Soap and Water 1 x Per Week/30 Days Discharge Instructions: May shower and wash wound with dial  antibacterial soap and water prior to dressing change. Cleanser: Wound Cleanser 1 x Per Week/30 Days Discharge Instructions: Cleanse the wound with wound cleanser prior to applying a clean dressing using gauze sponges, not tissue or cotton balls. Peri-Wound Care: Sween Lotion (Moisturizing lotion) 1 x Per Week/30 Days Discharge Instructions: Apply moisturizing lotion as directed Prim Dressing: Maxorb Extra Ag+ Alginate Dressing, 2x2 (in/in) 1 x Per Week/30 Days ary Discharge Instructions: Apply to wound bed as instructed Secondary Dressing: Woven Gauze Sponge, Non-Sterile 4x4 in 1 x Per Week/30 Days Discharge Instructions: Apply over primary dressing as directed. Com pression Wrap: Urgo K2 Lite, (equivalent to a 3 layer) two layer compression system, regular 1 x Per Week/30 Days Discharge Instructions: Apply Urgo K2 Lite as directed (alternative to 3 layer compression). 10/21/2023: The anterior wound is healed and the lateral wound is down to just about a millimeter. There is a little bit of eschar on the surface. Edema control is good. I used a curette to debride the eschar from the wound. We will continue silver alginate and Urgo light compression. I anticipate that he will likely be completely healed at his visit in 1 week's time. Electronic Signature(s) Evan [name], Evan [name] ([id number]) [id number] Page 7 of 9 Signed: 10/21/2023 12:25:43 PM By: Duanne Guess MD FACS Signed: 10/21/2023 4:18:02 PM By: Samuella Bruin Previous Signature: 10/21/2023 10:57:30 AM Version By: Duanne Guess MD FACS Entered By: Samuella Bruin on 10/21/2023 11:00:21 -------------------------------------------------------------------------------- HxROS Details Patient Name: Date of Service: Evan [name], Evan Evan C. 10/21/2023 10:45 A M Medical Record Number: [id number] Patient Account Number: [id number] Date of Birth/Sex: Treating RN: Nov 29, 1971 (52 y.o. M) Primary Care Provider:  Jiles Prows Other Clinician: Referring Provider: Treating Provider/Extender: Darci Current in Treatment: 1 Information Obtained From Patient Chart Constitutional Symptoms (General Health) Medical History: Past Medical History Notes: obesity Hematologic/Lymphatic Medical History: Positive for: Lymphedema - right arm Cardiovascular Medical History: Positive for: Hypertension; Peripheral Venous Disease Gastrointestinal Medical History: Positive for: Hepatitis C Past Medical History Notes: liver necrosis, chronic hepatitis Genitourinary Medical History: Negative for: End Stage Renal Disease Past Medical History Notes: erectile dysfunction Integumentary (Skin) Medical History: Negative for: History of Burn Past Medical History Notes: eczema Musculoskeletal Medical History: Past Medical History Notes: hx right tibial fx, right knee meniscus tear, septic arthritis of right knee Neurologic Medical History: Positive for: Neuropathy Oncologic Medical History: Positive for: Received Chemotherapy; Received Radiation Past Medical History Notes: metastatic melanoma Evan [name], Evan [name] ([id number]) [id number] Page 8 of 9 Psychiatric Medical History: Negative for: Anorexia/bulimia; Confinement Anxiety Past Medical History Notes: hx alcohol abuse, ADD, panic disorder Immunizations Pneumococcal Vaccine: Received Pneumococcal Vaccination: No Implantable Devices None Hospitalization / Surgery History Type of Hospitalization/Surgery IandD right knee 04/13/2019 hardware removal right tibia 04/17/2019 ORIF tibia plateau 01/20/2019 lymphadenectomy left left shoulder surgery Family and Social History Cancer: Yes - Paternal Grandparents; Diabetes: No; Heart Disease: Yes - Mother; Hereditary Spherocytosis: No; Hypertension:  Provider: Jiles Prows Other Clinician: Referring Provider: Treating Provider/Extender: Darci Current in Treatment: 1 Active Problems ICD-10 Encounter Code Description Active Date MDM Diagnosis [phone] Non-pressure chronic ulcer of other part of right lower leg with fat layer 10/08/2023 No Yes exposed I87.2 Venous insufficiency (chronic) (peripheral) 10/08/2023 No Yes C43.9 Malignant melanoma of skin, unspecified 10/08/2023 No Yes Inactive Problems Resolved Problems Electronic Signature(s) Signed: 10/21/2023 10:54:15 AM By: Duanne Guess MD FACS Entered By: Duanne Guess on 10/21/2023 10:54:15 -------------------------------------------------------------------------------- Progress Note Details Patient Name: Date of Service: Evan [name], Evan Canny C. 10/21/2023 10:45 A M Medical Record Number: [id number] Patient Account Number: [id number] Date of Birth/Sex: Treating RN: [birth year] (52 y.o. 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 He also has a history of significant trauma to his right lower leg that required surgical Evan [name], Evan [name] ([id number]) [id number] Page 5 of 9 repair of a tibial plateau fracture. He has venous insufficiency as evidenced by a venous reflux study performed in 2023, but vascular surgery has evaluated him and determined that he does not have a sufficient segment long enough to ablate for any benefit. He has had a wound on his right lower leg for about 3 years. He was previously seen in the orthopedic clinic by Dr. Lajoyce Corners. He has been on compression, either a stocking or a wrap without significant improvement in the wound. He was seen oncology last month and they placed a referral to the wound care center for further evaluation and management of this wound. 10/21/2023: The anterior wound is healed and the lateral wound is down to just about a millimeter. There is a little bit of eschar on the surface. Edema control is good. Patient History Information obtained from Patient, Chart. Family History Cancer - Paternal Grandparents, Heart Disease - Mother, Hypertension - Father, Stroke - Maternal Grandparents, No family history of Diabetes, Hereditary Spherocytosis, Kidney Disease, Lung Disease, Seizures, Thyroid Problems, Tuberculosis. Social History Former smoker - quit over 20 yrs ago, Marital Status - Divorced, Alcohol Use - Never, Drug Use - No History, Caffeine Use  - Moderate - tea. Medical History Hematologic/Lymphatic Patient has history of Lymphedema - right arm Cardiovascular Patient has history of Hypertension, Peripheral Venous Disease Gastrointestinal Patient has history of Hepatitis C Genitourinary Denies history of End Stage Renal Disease Integumentary (Skin) Denies history of History of Burn Neurologic Patient has history of Neuropathy Oncologic Patient has history of Received Chemotherapy, Received Radiation Psychiatric Denies history of Anorexia/bulimia, Confinement Anxiety Hospitalization/Surgery History - IandD right knee 04/13/2019. - hardware removal right tibia 04/17/2019. - ORIF tibia plateau 01/20/2019. - lymphadenectomy left. - left shoulder surgery. Medical A Surgical History Notes nd Constitutional Symptoms (General Health) obesity Gastrointestinal liver necrosis, chronic hepatitis Genitourinary erectile dysfunction Integumentary (Skin) eczema Musculoskeletal hx right tibial fx, right knee meniscus tear, septic arthritis of right knee Oncologic metastatic melanoma Psychiatric hx alcohol abuse, ADD, panic disorder Objective Constitutional Hypertensive, asymptomatic. no acute distress. Vitals Time Taken: 10:44 AM, Height: 75 in, Weight: 260 lbs, BMI: 32.5, Temperature: 98.6 F, Pulse:  is good. Electronic Signature(s) Signed: 10/21/2023 10:55:15 AM By: Duanne Guess MD FACS Entered By: Duanne Guess on 10/21/2023 10:55:14 -------------------------------------------------------------------------------- Physical Exam Details Patient Name: Date of Service: Evan [name], Evan Salaam. 10/21/2023 10:45 A M Medical Record Number: [id number] Patient Account Number: [id number] Date of Birth/Sex: Treating RN: July 11, 1971 (52 y.o. M) Primary Care Provider: Jiles Prows Other Clinician: Referring Provider: Treating Provider/Extender: Darci Current in Treatment: 1 Constitutional Hypertensive, asymptomatic. . . . no acute distress. Respiratory Normal work of breathing on room air.. Notes 10/21/2023: The anterior wound is healed and the lateral wound is down to just about a millimeter. There is a little bit of eschar on the surface. Edema control is good. Electronic Signature(s) DIANE, BUNDREN ([id number]) [id number] Page 3 of 9 Signed: 10/21/2023 10:55:46 AM By: Duanne Guess MD FACS Entered By: Duanne Guess on 10/21/2023 10:55:46 -------------------------------------------------------------------------------- Physician Orders Details Patient Name: Date of Service: Evan [name], Evan Salaam. 10/21/2023 10:45 A M Medical Record Number: [id number] Patient Account Number: [id number] Date of Birth/Sex: Treating RN: [birth year] (52 y.o. Marlan Palau Primary Care Provider:  Jiles Prows Other Clinician: Referring Provider: Treating Provider/Extender: Darci Current in Treatment: 1 The following information was scribed by: Samuella Bruin The information was scribed for: Duanne Guess Verbal / Phone Orders: No Diagnosis Coding Follow-up Appointments ppointment in 1 week. - Dr. Lady Gary RM 1 Return A Anesthetic Wound #1 Right,Lateral Lower Leg (In clinic) Topical Lidocaine 4% applied to wound bed Bathing/ Shower/ Hygiene May shower with protection but do not get wound dressing(s) wet. Protect dressing(s) with water repellant cover (for example, large plastic bag) or a cast cover and may then take shower. Lymphedema Treatment Plan - Exercise, Compression and Elevation Exercise daily as tolerated. (Walking, ROM, Calf Pumps and Toe Taps) Compression Wraps as ordered Elevate legs 30 - 60 minutes at or above heart level at least 3 - 4 times daily as able/tolerated Avoid standing for long periods and elevate leg(s) parallel to the floor when sitting Wound Treatment Wound #1 - Lower Leg Wound Laterality: Right, Lateral Cleanser: Soap and Water 1 x Per Week/30 Days Discharge Instructions: May shower and wash wound with dial antibacterial soap and water prior to dressing change. Cleanser: Wound Cleanser 1 x Per Week/30 Days Discharge Instructions: Cleanse the wound with wound cleanser prior to applying a clean dressing using gauze sponges, not tissue or cotton balls. Peri-Wound Care: Sween Lotion (Moisturizing lotion) 1 x Per Week/30 Days Discharge Instructions: Apply moisturizing lotion as directed Prim Dressing: Maxorb Extra Ag+ Alginate Dressing, 2x2 (in/in) 1 x Per Week/30 Days ary Discharge Instructions: Apply to wound bed as instructed Secondary Dressing: Woven Gauze Sponge, Non-Sterile 4x4 in 1 x Per Week/30 Days Discharge Instructions: Apply over primary dressing as directed. Compression Wrap: Urgo K2 Lite, (equivalent to a 3  layer) two layer compression system, regular 1 x Per Week/30 Days Discharge Instructions: Apply Urgo K2 Lite as directed (alternative to 3 layer compression). Patient Medications llergies: Iodinated Contrast Media, Fish Containing Products A Notifications Medication Indication Start End 10/21/2023 lidocaine DOSE topical 4 % cream - cream topical Electronic Signature(s) Signed: 10/21/2023 12:25:43 PM By: Duanne Guess MD FACS Evan [name] ([id number]) [id number] Page 4 of 9 Entered By: Duanne Guess on 10/21/2023 10:57:03 -------------------------------------------------------------------------------- Problem List Details Patient Name: Date of Service: Evan [name] 10/21/2023 10:45 A M Medical Record Number: [id number] Patient Account Number: [id number] Date of Birth/Sex: Treating RN: Dec 02, 1971 (52 y.o. M) Primary Care  Provider: Jiles Prows Other Clinician: Referring Provider: Treating Provider/Extender: Darci Current in Treatment: 1 Active Problems ICD-10 Encounter Code Description Active Date MDM Diagnosis [phone] Non-pressure chronic ulcer of other part of right lower leg with fat layer 10/08/2023 No Yes exposed I87.2 Venous insufficiency (chronic) (peripheral) 10/08/2023 No Yes C43.9 Malignant melanoma of skin, unspecified 10/08/2023 No Yes Inactive Problems Resolved Problems Electronic Signature(s) Signed: 10/21/2023 10:54:15 AM By: Duanne Guess MD FACS Entered By: Duanne Guess on 10/21/2023 10:54:15 -------------------------------------------------------------------------------- Progress Note Details Patient Name: Date of Service: Evan [name], Evan Canny C. 10/21/2023 10:45 A M Medical Record Number: [id number] Patient Account Number: [id number] Date of Birth/Sex: Treating RN: [birth year] (52 y.o. 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 He also has a history of significant trauma to his right lower leg that required surgical Evan [name], Evan [name] ([id number]) [id number] Page 5 of 9 repair of a tibial plateau fracture. He has venous insufficiency as evidenced by a venous reflux study performed in 2023, but vascular surgery has evaluated him and determined that he does not have a sufficient segment long enough to ablate for any benefit. He has had a wound on his right lower leg for about 3 years. He was previously seen in the orthopedic clinic by Dr. Lajoyce Corners. He has been on compression, either a stocking or a wrap without significant improvement in the wound. He was seen oncology last month and they placed a referral to the wound care center for further evaluation and management of this wound. 10/21/2023: The anterior wound is healed and the lateral wound is down to just about a millimeter. There is a little bit of eschar on the surface. Edema control is good. Patient History Information obtained from Patient, Chart. Family History Cancer - Paternal Grandparents, Heart Disease - Mother, Hypertension - Father, Stroke - Maternal Grandparents, No family history of Diabetes, Hereditary Spherocytosis, Kidney Disease, Lung Disease, Seizures, Thyroid Problems, Tuberculosis. Social History Former smoker - quit over 20 yrs ago, Marital Status - Divorced, Alcohol Use - Never, Drug Use - No History, Caffeine Use  - Moderate - tea. Medical History Hematologic/Lymphatic Patient has history of Lymphedema - right arm Cardiovascular Patient has history of Hypertension, Peripheral Venous Disease Gastrointestinal Patient has history of Hepatitis C Genitourinary Denies history of End Stage Renal Disease Integumentary (Skin) Denies history of History of Burn Neurologic Patient has history of Neuropathy Oncologic Patient has history of Received Chemotherapy, Received Radiation Psychiatric Denies history of Anorexia/bulimia, Confinement Anxiety Hospitalization/Surgery History - IandD right knee 04/13/2019. - hardware removal right tibia 04/17/2019. - ORIF tibia plateau 01/20/2019. - lymphadenectomy left. - left shoulder surgery. Medical A Surgical History Notes nd Constitutional Symptoms (General Health) obesity Gastrointestinal liver necrosis, chronic hepatitis Genitourinary erectile dysfunction Integumentary (Skin) eczema Musculoskeletal hx right tibial fx, right knee meniscus tear, septic arthritis of right knee Oncologic metastatic melanoma Psychiatric hx alcohol abuse, ADD, panic disorder Objective Constitutional Hypertensive, asymptomatic. no acute distress. Vitals Time Taken: 10:44 AM, Height: 75 in, Weight: 260 lbs, BMI: 32.5, Temperature: 98.6 F, Pulse:  Exercise, Compression and Elevation: Exercise daily as tolerated. (Walking, ROM, Calf Pumps and T T oe aps) Compression Wraps as ordered Elevate legs 30 - 60 minutes at or above heart level at least 3 - 4 times daily as able/tolerated Avoid standing for long periods and elevate leg(s) parallel to the floor when sitting The following medication(s) was prescribed: lidocaine topical 4 % cream cream topical was prescribed at facility WOUND #1: - Lower Leg Wound Laterality: Right, Lateral Cleanser: Soap and Water 1 x Per Week/30 Days Discharge Instructions: May shower and wash wound with dial  antibacterial soap and water prior to dressing change. Cleanser: Wound Cleanser 1 x Per Week/30 Days Discharge Instructions: Cleanse the wound with wound cleanser prior to applying a clean dressing using gauze sponges, not tissue or cotton balls. Peri-Wound Care: Sween Lotion (Moisturizing lotion) 1 x Per Week/30 Days Discharge Instructions: Apply moisturizing lotion as directed Prim Dressing: Maxorb Extra Ag+ Alginate Dressing, 2x2 (in/in) 1 x Per Week/30 Days ary Discharge Instructions: Apply to wound bed as instructed Secondary Dressing: Woven Gauze Sponge, Non-Sterile 4x4 in 1 x Per Week/30 Days Discharge Instructions: Apply over primary dressing as directed. Com pression Wrap: Urgo K2 Lite, (equivalent to a 3 layer) two layer compression system, regular 1 x Per Week/30 Days Discharge Instructions: Apply Urgo K2 Lite as directed (alternative to 3 layer compression). 10/21/2023: The anterior wound is healed and the lateral wound is down to just about a millimeter. There is a little bit of eschar on the surface. Edema control is good. I used a curette to debride the eschar from the wound. We will continue silver alginate and Urgo light compression. I anticipate that he will likely be completely healed at his visit in 1 week's time. Electronic Signature(s) Evan [name], Evan [name] ([id number]) [id number] Page 7 of 9 Signed: 10/21/2023 12:25:43 PM By: Duanne Guess MD FACS Signed: 10/21/2023 4:18:02 PM By: Samuella Bruin Previous Signature: 10/21/2023 10:57:30 AM Version By: Duanne Guess MD FACS Entered By: Samuella Bruin on 10/21/2023 11:00:21 -------------------------------------------------------------------------------- HxROS Details Patient Name: Date of Service: Evan [name], Evan Evan C. 10/21/2023 10:45 A M Medical Record Number: [id number] Patient Account Number: [id number] Date of Birth/Sex: Treating RN: Nov 29, 1971 (52 y.o. M) Primary Care Provider:  Jiles Prows Other Clinician: Referring Provider: Treating Provider/Extender: Darci Current in Treatment: 1 Information Obtained From Patient Chart Constitutional Symptoms (General Health) Medical History: Past Medical History Notes: obesity Hematologic/Lymphatic Medical History: Positive for: Lymphedema - right arm Cardiovascular Medical History: Positive for: Hypertension; Peripheral Venous Disease Gastrointestinal Medical History: Positive for: Hepatitis C Past Medical History Notes: liver necrosis, chronic hepatitis Genitourinary Medical History: Negative for: End Stage Renal Disease Past Medical History Notes: erectile dysfunction Integumentary (Skin) Medical History: Negative for: History of Burn Past Medical History Notes: eczema Musculoskeletal Medical History: Past Medical History Notes: hx right tibial fx, right knee meniscus tear, septic arthritis of right knee Neurologic Medical History: Positive for: Neuropathy Oncologic Medical History: Positive for: Received Chemotherapy; Received Radiation Past Medical History Notes: metastatic melanoma Evan [name], Evan [name] ([id number]) [id number] Page 8 of 9 Psychiatric Medical History: Negative for: Anorexia/bulimia; Confinement Anxiety Past Medical History Notes: hx alcohol abuse, ADD, panic disorder Immunizations Pneumococcal Vaccine: Received Pneumococcal Vaccination: No Implantable Devices None Hospitalization / Surgery History Type of Hospitalization/Surgery IandD right knee 04/13/2019 hardware removal right tibia 04/17/2019 ORIF tibia plateau 01/20/2019 lymphadenectomy left left shoulder surgery Family and Social History Cancer: Yes - Paternal Grandparents; Diabetes: No; Heart Disease: Yes - Mother; Hereditary Spherocytosis: No; Hypertension:  Evan [name], Evan [name] ([id number]) [id number] Page 1 of 9 Visit Report for 10/21/2023 Chief Complaint Document Details Patient Name: Date of Service: Evan [name] 10/21/2023 10:45 A M Medical Record Number: [id number] Patient Account Number: [id number] Date of Birth/Sex: Treating RN: 11/09/1971 (52 y.o. M) Primary Care Provider: Jiles Prows Other Clinician: Referring Provider: Treating Provider/Extender: Darci Current in Treatment: 1 Information Obtained from: Patient Chief Complaint Patient presents for treatment of an open ulcer due to venous insufficiency Electronic Signature(s) Signed: 10/21/2023 10:54:29 AM By: Duanne Guess MD FACS Entered By: Duanne Guess on 10/21/2023 10:54:29 -------------------------------------------------------------------------------- Debridement Details Patient Name: Date of Service: Evan [name], Evan Canny C. 10/21/2023 10:45 A M Medical Record Number: [id number] Patient Account Number: [id number] Date of Birth/Sex: Treating RN: 1971/07/11 (52 y.o. Marlan Palau Primary Care Provider: Jiles Prows Other Clinician: Referring Provider: Treating Provider/Extender: Darci Current in Treatment: 1 Debridement Performed for Assessment: Wound #1 Right,Lateral Lower Leg Performed By: Physician Duanne Guess, MD The following information was scribed by: Samuella Bruin The information was scribed for: Duanne Guess Debridement Type: Debridement Severity of Tissue Pre Debridement: Fat layer exposed Level of Consciousness (Pre-procedure): Awake and Alert Pre-procedure Verification/Time Out Yes - 10:51 Taken: Start Time: 10:51 Pain Control: Lidocaine 4% Topical Solution Percent of Wound Bed Debrided: 100% T Area Debrided (cm): otal 0.01 Tissue and other material debrided: Non-Viable, Eschar Level: Non-Viable Tissue Debridement Description: Selective/Open  Wound Instrument: Curette Bleeding: Minimum Hemostasis Achieved: Pressure Response to Treatment: Procedure was tolerated well Level of Consciousness (Post- Awake and Alert procedure): Post Debridement Measurements of Total Wound Length: (cm) 0.1 Width: (cm) 0.1 Depth: (cm) 0.1 Volume: (cm) 0.001 Character of Wound/Ulcer Post Debridement: Improved Severity of Tissue Post Debridement: Fat layer exposed LAWSYN, ROGACKI C ([id number]) [id number] Page 2 of 9 Post Procedure Diagnosis Same as Pre-procedure Electronic Signature(s) Signed: 10/21/2023 12:25:43 PM By: Duanne Guess MD FACS Signed: 10/21/2023 4:18:02 PM By: Samuella Bruin Entered By: Samuella Bruin on 10/21/2023 10:53:07 -------------------------------------------------------------------------------- HPI Details Patient Name: Date of Service: Evan [name], Evan Evan C. 10/21/2023 10:45 A M Medical Record Number: [id number] Patient Account Number: [id number] Date of Birth/Sex: Treating RN: Apr 26, 1971 (52 y.o. 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 He also has a history of significant trauma to his right lower leg that required surgical repair of a tibial plateau fracture. He has venous insufficiency as evidenced by a venous reflux study performed in 2023, but vascular surgery has evaluated him and determined that he does not have a sufficient segment long enough to ablate for any benefit. He has had a wound on his right lower leg for about 3 years.  He was previously seen in the orthopedic clinic by Dr. Lajoyce Corners. He has been on compression, either a stocking or a wrap without significant improvement in the wound. He was seen oncology last month and they placed a referral to the wound care center for further evaluation and management of this wound. 10/21/2023: The anterior wound is healed and the lateral wound is down to just about a millimeter. There is a little bit of eschar on the surface. Edema control

## 2023-10-21 NOTE — Progress Notes (Signed)
reduction of 30% by week 4 Date Initiated: 10/08/2023 Target Resolution Date: 11/05/2023 Goal Status: Active Interventions: Assess patient/caregiver ability to obtain necessary supplies Assess patient/caregiver ability to perform ulcer/skin care regimen upon admission and as needed Assess ulceration(s) every  visit Provide education on ulcer and skin care SPIROS, RUTIGLIANO ([id number]) [phone].pdf Page 5 of 9 Treatment Activities: Skin care regimen initiated : 10/08/2023 Topical wound management initiated : 10/08/2023 Notes: Electronic Signature(s) Signed: 10/21/2023 4:18:02 PM By: Samuella Bruin Entered By: Samuella Bruin on 10/21/2023 10:52:51 -------------------------------------------------------------------------------- Pain Assessment Details Patient Name: Date of Service: Evan [name] 10/21/2023 10:45 A M Medical Record Number: [id number] Patient Account Number: [id number] Date of Birth/Sex: Treating RN: 11-13-1971 (52 y.o. Evan [name] Primary Care Diesha Rostad: Evan [name] Other Clinician: Referring Evan [name]: Treating Ary Lavine/Extender: Darci Current in Treatment: 1 Active Problems Location of Pain Severity and Description of Pain Patient Has Paino No Site Locations Rate the pain. Current Pain Level: 0 Pain Management and Medication Current Pain Management: Electronic Signature(s) Signed: 10/21/2023 4:18:02 PM By: Samuella Bruin Entered By: Samuella Bruin on 10/21/2023 10:43:57 -------------------------------------------------------------------------------- Patient/Caregiver Education Details Patient Name: Date of Service: Evan [name], Evan [name] 10/28/2024andnbsp10:45 A M Medical Record Number: [id number] Patient Account Number: [id number] Date of Birth/Gender: Treating RN: 1971/02/09 (52 y.o. Evan [name] Primary Care Physician: Evan [name] Other Clinician: Referring Physician: Treating Physician/Extender: Darci Current in Treatment: 1 CRUIZ, IMPERIAL ([id number]) [id number] Page 6 of 9 Education Assessment Education Provided To: Patient Education Topics Provided Wound/Skin Impairment: Methods: Explain/Verbal Responses: Reinforcements  needed, State content correctly Electronic Signature(s) Signed: 10/21/2023 4:18:02 PM By: Samuella Bruin Entered By: Samuella Bruin on 10/21/2023 10:53:02 -------------------------------------------------------------------------------- Wound Assessment Details Patient Name: Date of Service: Evan [name], Evan [name] 10/21/2023 10:45 A M Medical Record Number: [id number] Patient Account Number: [id number] Date of Birth/Sex: Treating RN: February 15, 1971 (52 y.o. Evan [name] Primary Care Sameka Bagent: Evan [name] Other Clinician: Referring Evan [name]: Treating Chanette Demo/Extender: Darci Current in Treatment: 1 Wound Status Wound Number: 1 Primary Venous Leg Ulcer Etiology: Wound Location: Right, Lateral Lower Leg Wound Open Wounding Event: Trauma Status: Date Acquired: 09/07/2020 Comorbid Lymphedema, Hypertension, Peripheral Venous Disease, Hepatitis Weeks Of Treatment: 1 History: C, Neuropathy, Received Chemotherapy, Received Radiation Clustered Wound: No Photos Wound Measurements Length: (cm) 0.1 Width: (cm) 0.1 Depth: (cm) 0.1 Area: (cm) 0.008 Volume: (cm) 0.001 % Reduction in Area: 99.3% % Reduction in Volume: 99.1% Epithelialization: Large (67-100%) Tunneling: No Undermining: No Wound Description Classification: Full Thickness Without Exposed Suppor Wound Margin: Flat and Intact Exudate Amount: Medium Exudate Type: Serosanguineous Exudate Color: red, brown t Structures Foul Odor After Cleansing: No Slough/Fibrino No Wound Bed Granulation Amount: Large (67-100%) Exposed ANTIONE, BEHRNS ([id number]) [id number] Page 7 of 9 Granulation Quality: Red Fascia Exposed: No Necrotic Amount: None Present (0%) Fat Layer (Subcutaneous Tissue) Exposed: Yes Tendon Exposed: No Muscle Exposed: No Joint Exposed: No Bone Exposed: No Periwound Skin Texture Texture Color No Abnormalities Noted: Yes No Abnormalities Noted:  No Hemosiderin Staining: Yes Moisture No Abnormalities Noted: No Temperature / Pain Dry / Scaly: Yes Temperature: No Abnormality Maceration: No Tenderness on Palpation: Yes Treatment Notes Wound #1 (Lower Leg) Wound Laterality: Right, Lateral Cleanser Soap and Water Discharge Instruction: May shower and wash wound with dial antibacterial soap and water prior to dressing change. Wound Cleanser Discharge Instruction: Cleanse the wound with wound cleanser prior to applying a clean dressing using gauze sponges, not tissue or cotton balls. Peri-Wound  VIDHUR, MADDEX ([id number]) [id number] Page 1 of 9 Visit Report for 10/21/2023 Arrival Information Details Patient Name: Date of Service: Evan [name], [place] 10/21/2023 10:45 A M Medical Record Number: [id number] Patient Account Number: [id number] Date of Birth/Sex: Treating RN: [birth year] (52 y.o. Evan [name] Primary Care Shadee Montoya: Evan [name] Other Clinician: Referring Parris Signer: Treating Lashunta Frieden/Extender: Darci Current in Treatment: 1 Visit Information History Since Last Visit Added or deleted any medications: No Patient Arrived: Ambulatory Any new allergies or adverse reactions: No Arrival Time: 10:42 Had a fall or experienced change in No Accompanied By: self activities of daily living that may affect Transfer Assistance: None risk of falls: Patient Identification Verified: Yes Signs or symptoms of abuse/neglect since last visito No Secondary Verification Process Completed: Yes Hospitalized since last visit: No Patient Requires Transmission-Based Precautions: No Implantable device outside of the clinic excluding No Patient Has Alerts: No cellular tissue based products placed in the center since last visit: Has Dressing in Place as Prescribed: Yes Has Compression in Place as Prescribed: No Pain Present Now: No Electronic Signature(s) Signed: 10/21/2023 4:18:02 PM By: Samuella Bruin Entered By: Samuella Bruin on 10/21/2023 10:43:46 -------------------------------------------------------------------------------- Compression Therapy Details Patient Name: Date of Service: Evan [name], Evan [name]. 10/21/2023 10:45 A M Medical Record Number: [id number] Patient Account Number: [id number] Date of Birth/Sex: Treating RN: [birth year] (52 y.o. Evan [name] Primary Care Leonda Cristo: Evan [name] Other Clinician: Referring Kewanda Poland: Treating Shaliyah Taite/Extender: Darci Current in Treatment:  1 Compression Therapy Performed for Wound Assessment: Wound #1 Right,Lateral Lower Leg Performed By: Clinician Samuella Bruin, RN Compression Type: Double Layer Post Procedure Diagnosis Same as Pre-procedure Electronic Signature(s) Signed: 10/21/2023 4:18:02 PM By: Samuella Bruin Entered By: Samuella Bruin on 10/21/2023 11:00:07 [name] ([id number]) [id number] Page 2 of 9 -------------------------------------------------------------------------------- Encounter Discharge Information Details Patient Name: Date of Service: Evan [name] 10/21/2023 10:45 A M Medical Record Number: [id number] Patient Account Number: [id number] Date of Birth/Sex: Treating RN: 09/15/1971 (52 y.o. Evan [name] Primary Care Peyson Postema: Evan [name] Other Clinician: Referring Abshir Paolini: Treating Muscab Brenneman/Extender: Darci Current in Treatment: 1 Encounter Discharge Information Items Post Procedure Vitals Discharge Condition: Stable Temperature (F): 98.6 Ambulatory Status: Ambulatory Pulse (bpm): 67 Discharge Destination: Home Respiratory Rate (breaths/min): 16 Transportation: Private Auto Blood Pressure (mmHg): 169/99 Accompanied By: self Schedule Follow-up Appointment: Yes Clinical Summary of Care: Patient Declined Electronic Signature(s) Signed: 10/21/2023 4:18:02 PM By: Samuella Bruin Entered By: Samuella Bruin on 10/21/2023 11:01:44 -------------------------------------------------------------------------------- Lower Extremity Assessment Details Patient Name: Date of Service: Evan [name] 10/21/2023 10:45 A M Medical Record Number: [id number] Patient Account Number: [id number] Date of Birth/Sex: Treating RN: 1971/10/16 (52 y.o. Evan [name] Primary Care Verlyn Dannenberg: Evan [name] Other Clinician: Referring Siona Coulston: Treating Libra Gatz/Extender: Darci Current in Treatment:  1 Edema Assessment Assessed: Kyra Searles: No] Franne Forts: No] Edema: [Left: Ye] [Right: s] Calf Left: Right: Point of Measurement: From Medial Instep 37.5 cm Ankle Left: Right: Point of Measurement: From Medial Instep 22.7 cm Vascular Assessment Pulses: Dorsalis Pedis Palpable: [Right:Yes] Extremity colors, hair growth, and conditions: Extremity Color: [Right:Hyperpigmented] Hair Growth on Extremity: [Right:Yes] Temperature of Extremity: [Right:Warm] Capillary Refill: [Right:< 3 seconds] Dependent Rubor: [Right:No No] Electronic Signature(s) Signed: 10/21/2023 4:18:02 PM By: Samuella Bruin Entered By: Samuella Bruin on 10/21/2023 10:45:02 [name] ([id number]) [id number] Page 3 of 9 -------------------------------------------------------------------------------- Multi Wound Chart Details Patient Name: Date of Service: Evan [name], [place] C. 10/21/2023 10:45 A M  VIDHUR, MADDEX ([id number]) [id number] Page 1 of 9 Visit Report for 10/21/2023 Arrival Information Details Patient Name: Date of Service: Evan [name], [place] 10/21/2023 10:45 A M Medical Record Number: [id number] Patient Account Number: [id number] Date of Birth/Sex: Treating RN: [birth year] (52 y.o. Evan [name] Primary Care Shadee Montoya: Evan [name] Other Clinician: Referring Parris Signer: Treating Lashunta Frieden/Extender: Darci Current in Treatment: 1 Visit Information History Since Last Visit Added or deleted any medications: No Patient Arrived: Ambulatory Any new allergies or adverse reactions: No Arrival Time: 10:42 Had a fall or experienced change in No Accompanied By: self activities of daily living that may affect Transfer Assistance: None risk of falls: Patient Identification Verified: Yes Signs or symptoms of abuse/neglect since last visito No Secondary Verification Process Completed: Yes Hospitalized since last visit: No Patient Requires Transmission-Based Precautions: No Implantable device outside of the clinic excluding No Patient Has Alerts: No cellular tissue based products placed in the center since last visit: Has Dressing in Place as Prescribed: Yes Has Compression in Place as Prescribed: No Pain Present Now: No Electronic Signature(s) Signed: 10/21/2023 4:18:02 PM By: Samuella Bruin Entered By: Samuella Bruin on 10/21/2023 10:43:46 -------------------------------------------------------------------------------- Compression Therapy Details Patient Name: Date of Service: Evan [name], Evan [name]. 10/21/2023 10:45 A M Medical Record Number: [id number] Patient Account Number: [id number] Date of Birth/Sex: Treating RN: [birth year] (52 y.o. Evan [name] Primary Care Leonda Cristo: Evan [name] Other Clinician: Referring Kewanda Poland: Treating Shaliyah Taite/Extender: Darci Current in Treatment:  1 Compression Therapy Performed for Wound Assessment: Wound #1 Right,Lateral Lower Leg Performed By: Clinician Samuella Bruin, RN Compression Type: Double Layer Post Procedure Diagnosis Same as Pre-procedure Electronic Signature(s) Signed: 10/21/2023 4:18:02 PM By: Samuella Bruin Entered By: Samuella Bruin on 10/21/2023 11:00:07 [name] ([id number]) [id number] Page 2 of 9 -------------------------------------------------------------------------------- Encounter Discharge Information Details Patient Name: Date of Service: Evan [name] 10/21/2023 10:45 A M Medical Record Number: [id number] Patient Account Number: [id number] Date of Birth/Sex: Treating RN: 09/15/1971 (52 y.o. Evan [name] Primary Care Peyson Postema: Evan [name] Other Clinician: Referring Abshir Paolini: Treating Muscab Brenneman/Extender: Darci Current in Treatment: 1 Encounter Discharge Information Items Post Procedure Vitals Discharge Condition: Stable Temperature (F): 98.6 Ambulatory Status: Ambulatory Pulse (bpm): 67 Discharge Destination: Home Respiratory Rate (breaths/min): 16 Transportation: Private Auto Blood Pressure (mmHg): 169/99 Accompanied By: self Schedule Follow-up Appointment: Yes Clinical Summary of Care: Patient Declined Electronic Signature(s) Signed: 10/21/2023 4:18:02 PM By: Samuella Bruin Entered By: Samuella Bruin on 10/21/2023 11:01:44 -------------------------------------------------------------------------------- Lower Extremity Assessment Details Patient Name: Date of Service: Evan [name] 10/21/2023 10:45 A M Medical Record Number: [id number] Patient Account Number: [id number] Date of Birth/Sex: Treating RN: 1971/10/16 (52 y.o. Evan [name] Primary Care Verlyn Dannenberg: Evan [name] Other Clinician: Referring Siona Coulston: Treating Libra Gatz/Extender: Darci Current in Treatment:  1 Edema Assessment Assessed: Kyra Searles: No] Franne Forts: No] Edema: [Left: Ye] [Right: s] Calf Left: Right: Point of Measurement: From Medial Instep 37.5 cm Ankle Left: Right: Point of Measurement: From Medial Instep 22.7 cm Vascular Assessment Pulses: Dorsalis Pedis Palpable: [Right:Yes] Extremity colors, hair growth, and conditions: Extremity Color: [Right:Hyperpigmented] Hair Growth on Extremity: [Right:Yes] Temperature of Extremity: [Right:Warm] Capillary Refill: [Right:< 3 seconds] Dependent Rubor: [Right:No No] Electronic Signature(s) Signed: 10/21/2023 4:18:02 PM By: Samuella Bruin Entered By: Samuella Bruin on 10/21/2023 10:45:02 [name] ([id number]) [id number] Page 3 of 9 -------------------------------------------------------------------------------- Multi Wound Chart Details Patient Name: Date of Service: Evan [name], [place] C. 10/21/2023 10:45 A M  Care Sween Lotion (Moisturizing lotion) Discharge Instruction: Apply moisturizing lotion as directed Topical Primary Dressing Maxorb Extra Ag+ Alginate Dressing, 2x2 (in/in) Discharge Instruction: Apply to wound bed as instructed Secondary Dressing Woven Gauze Sponge, Non-Sterile 4x4 in Discharge Instruction: Apply over primary dressing as directed. Secured With Compression Wrap Urgo K2 Lite, (equivalent to a 3 layer) two layer compression system, regular Discharge Instruction: Apply Urgo K2 Lite as directed (alternative to 3 layer compression). Compression Stockings Add-Ons Electronic Signature(s) Signed: 10/21/2023 4:18:02 PM By: Samuella Bruin Entered By: Samuella Bruin on 10/21/2023 10:48:49 -------------------------------------------------------------------------------- Wound Assessment Details Patient Name: Date of Service: Evan [name], Evan [name] 10/21/2023 10:45 A M Medical Record Number: [id number] Patient Account Number: [id number] Date of Birth/Sex: Treating RN: 05-22-1971 (52 y.o. Evan [name] Primary Care Azaylea Maves: Evan [name] Other Clinician: Referring Roselie Cirigliano: Treating Everleigh Colclasure/Extender: Darci Current in Treatment: 1 Wound Status Wound Number: 2 Primary Venous Leg Ulcer Etiology: Wound Location: Right, Anterior Lower Leg Wound Open Wounding Event: Trauma Status: Date  Acquired: 09/07/2020 PHALEN, BONNAR ([id number]) [phone].pdf Page 8 of 9 Date Acquired: 09/07/2020 Comorbid Lymphedema, Hypertension, Peripheral Venous Disease, Hepatitis Weeks Of Treatment: 1 History: C, Neuropathy, Received Chemotherapy, Received Radiation Clustered Wound: No Photos Wound Measurements Length: (cm) Width: (cm) Depth: (cm) Area: (cm) Volume: (cm) 0 % Reduction in Area: 100% 0 % Reduction in Volume: 100% 0 Epithelialization: Large (67-100%) 0 Tunneling: No 0 Undermining: No Wound Description Classification: Full Thickness Without Exposed Suppor Wound Margin: Flat and Intact Exudate Amount: None Present t Structures Foul Odor After Cleansing: No Slough/Fibrino No Wound Bed Granulation Amount: None Present (0%) Exposed Structure Necrotic Amount: None Present (0%) Fascia Exposed: No Fat Layer (Subcutaneous Tissue) Exposed: No Tendon Exposed: No Muscle Exposed: No Joint Exposed: No Bone Exposed: No Periwound Skin Texture Texture Color No Abnormalities Noted: Yes No Abnormalities Noted: No Hemosiderin Staining: Yes Moisture No Abnormalities Noted: No Temperature / Pain Dry / Scaly: Yes Temperature: No Abnormality Maceration: No Tenderness on Palpation: Yes Electronic Signature(s) Signed: 10/21/2023 4:18:02 PM By: Samuella Bruin Entered By: Samuella Bruin on 10/21/2023 10:49:06 -------------------------------------------------------------------------------- Vitals Details Patient Name: Date of Service: Evan [name], [place] RE[place] C. 10/21/2023 10:45 A M Medical Record Number: [id number] Patient Account Number: [id number] Date of Birth/Sex: Treating RN: Jul 23, 1971 (52 y.o. Evan [name] Primary Care Makail Watling: Evan [name] Other Clinician: Referring Giancarlos Berendt: Treating Tyeson Tanimoto/Extender: Darci Current in Treatment: 1 Vital Signs Time Taken: 10:44 Temperature (F): 98.6 Height (in): 75 Pulse (bpm):  67 Weight (lbs): 260 Respiratory Rate (breaths/min): 16 Body Mass Index (BMI): 32.5 Blood Pressure (mmHg): 169/99 NAKIA, GOLLIHER ([id number]) [id number] Page 9 of 9 Reference Range: 80 - 120 mg / dl Electronic Signature(s) Signed: 10/21/2023 4:18:02 PM By: Samuella Bruin Entered By: Samuella Bruin on 10/21/2023 10:44:17

## 2023-10-21 NOTE — Telephone Encounter (Signed)
Pt states that they don't have his 90 day supply of meds at the pharmacy for his atenolol. I see this message was sent 10.1.24 is this still accurate? Wallace Cullens : I sent in a 30-day supply once I saw his has lab results. He needs labs repeated in 7-10 days after he restarts his medications to make sure everything is stable. Once lab results have been reviewed 90-day supply with refills can be prescribed. The lab orders are already active. He just needs to come to lab 7-10 days after he starts the medications again.   The 30-day supply has been sent to the [facility]) on file.

## 2023-10-23 NOTE — Telephone Encounter (Signed)
Called pt to let him be aware that he needs to come back for repeat of labs, LVM, send my chart message to let him be aware also.

## 2023-11-01 ENCOUNTER — Ambulatory Visit (HOSPITAL_BASED_OUTPATIENT_CLINIC_OR_DEPARTMENT_OTHER): Payer: MEDICAID | Admitting: General Surgery

## 2023-11-14 ENCOUNTER — Encounter: Payer: Self-pay | Admitting: Dermatology

## 2023-11-14 ENCOUNTER — Ambulatory Visit: Payer: MEDICAID | Admitting: Dermatology

## 2023-11-14 VITALS — BP 136/91 | HR 76

## 2023-11-14 DIAGNOSIS — Z1283 Encounter for screening for malignant neoplasm of skin: Secondary | ICD-10-CM | POA: Diagnosis not present

## 2023-11-14 DIAGNOSIS — R222 Localized swelling, mass and lump, trunk: Secondary | ICD-10-CM

## 2023-11-14 DIAGNOSIS — L821 Other seborrheic keratosis: Secondary | ICD-10-CM

## 2023-11-14 DIAGNOSIS — D1801 Hemangioma of skin and subcutaneous tissue: Secondary | ICD-10-CM

## 2023-11-14 DIAGNOSIS — L814 Other melanin hyperpigmentation: Secondary | ICD-10-CM

## 2023-11-14 DIAGNOSIS — L578 Other skin changes due to chronic exposure to nonionizing radiation: Secondary | ICD-10-CM

## 2023-11-14 DIAGNOSIS — D229 Melanocytic nevi, unspecified: Secondary | ICD-10-CM

## 2023-11-14 DIAGNOSIS — C439 Malignant melanoma of skin, unspecified: Secondary | ICD-10-CM

## 2023-11-14 DIAGNOSIS — L0291 Cutaneous abscess, unspecified: Secondary | ICD-10-CM

## 2023-11-14 DIAGNOSIS — W908XXA Exposure to other nonionizing radiation, initial encounter: Secondary | ICD-10-CM

## 2023-11-14 DIAGNOSIS — L723 Sebaceous cyst: Secondary | ICD-10-CM

## 2023-11-14 DIAGNOSIS — Z85820 Personal history of malignant melanoma of skin: Secondary | ICD-10-CM

## 2023-11-14 MED ORDER — TRIAMCINOLONE ACETONIDE 40 MG/ML IJ SUSP
40.0000 mg | Freq: Once | INTRAMUSCULAR | Status: AC
Start: 2023-11-14 — End: 2023-11-14
  Administered 2023-11-14: 40 mg via INTRAMUSCULAR

## 2023-11-14 MED ORDER — CLINDAMYCIN HCL 300 MG PO CAPS: 300.0000 mg | ORAL_CAPSULE | Freq: Three times a day (TID) | ORAL | 0 refills | Status: DC

## 2023-11-14 NOTE — Progress Notes (Signed)
New Patient Visit   Subjective  Evan [name] is a 52 y.o. male who presents for the following: Skin Cancer Screening and Full Body Skin Exam  The patient presents for Total-Body Skin Exam (TBSE) for skin cancer screening and mole check. The patient has spots, moles and lesions to be evaluated, some may be new or changing. Pt has hx of MM 2015 on abdomen that went metastatic and had lymph nodes removed, s/p radiation and ipilimumab  The following portions of the chart were reviewed this encounter and updated as appropriate: medications, allergies, medical history  Review of Systems:  No other skin or systemic complaints except as noted in HPI or Assessment and Plan.  Objective  Well appearing patient in no apparent distress; mood and affect are within normal limits.  A full examination was performed including scalp, head, eyes, ears, nose, lips, neck, chest, axillae, abdomen, back, buttocks, bilateral upper extremities, bilateral lower extremities, hands, feet, fingers, toes, fingernails, and toenails. All findings within normal limits unless otherwise noted below.   Relevant physical exam findings are noted in the Assessment and Plan.  Right Abdomen (side) - Upper Procedure Note Intralesional Injection  Location: mid abdomen  Informed Consent: Discussed risks (infection, pain, bleeding, bruising, thinning of the skin, loss of skin pigment, lack of resolution, and recurrence of lesion) and benefits of the procedure, as well as the alternatives. Informed consent was obtained. Preparation: The area was prepared a standard fashion.  Anesthesia: N/A  Procedure Details: An intralesional injection was performed with Kenalog 40 mg/cc. 1.0 cc in total were injected. NDC #: [id number] Exp: 05/22/2025  Total number of injections: 1  Plan: The patient was instructed on post-op care. Recommend OTC analgesia as needed for pain.    Assessment & Plan   SKIN CANCER SCREENING PERFORMED  TODAY.  ACTINIC DAMAGE - Chronic condition, secondary to cumulative UV/sun exposure - diffuse scaly erythematous macules with underlying dyspigmentation - Recommend daily broad spectrum sunscreen SPF 30+ to sun-exposed areas, reapply every 2 hours as needed.  - Staying in the shade or wearing long sleeves, sun glasses (UVA+UVB protection) and wide brim hats (4-inch brim around the entire circumference of the hat) are also recommended for sun protection.  - Call for new or changing lesions.  MELANOCYTIC NEVI - Tan-brown and/or pink-flesh-colored symmetric macules and papules - Benign appearing on exam today - Observation - Call clinic for new or changing moles - Recommend daily use of broad spectrum spf 30+ sunscreen to sun-exposed areas.   SEBORRHEIC KERATOSIS - Stuck-on, waxy, tan-brown papules and/or plaques  - Benign-appearing - Discussed benign etiology and prognosis. - Observe - Call for any changes  LENTIGINES Exam: scattered tan macules Due to sun exposure Treatment Plan: Benign-appearing, observe. Recommend daily broad spectrum sunscreen SPF 30+ to sun-exposed areas, reapply every 2 hours as needed.  Call for any changes    HEMANGIOMA Exam: red papule(s) Discussed benign nature. Recommend observation. Call for changes.   HISTORY OF METASTATIC MELANOMA - No evidence of recurrence today - No lymphadenopathy - Recommend regular full body skin exams - Recommend daily broad spectrum sunscreen SPF 30+ to sun-exposed areas, reapply every 2 hours as needed.  - Call if any new or changing lesions are noted between office visits - Seeing Oncology, getting regular PET scans  Subcutaneous nodule- Right abdomen- Ddx abscess vs metastatic melanoma vs other - Will inject with ILK  and start clindamycin, if not healed, will biopsy at next visit  Return in about 4  weeks (around 12/12/2023) for spot check abdomen.  I, Tillie Fantasia, CMA, am acting as scribe for Gwenith Daily, MD.    We spent 45 min reviewing records, taking the patient history, providing face to face care with the patient, sending prescriptions.  Documentation: I have reviewed the above documentation for accuracy and completeness, and I agree with the above.  Gwenith Daily, MD

## 2023-11-14 NOTE — Patient Instructions (Signed)

## 2023-12-11 ENCOUNTER — Ambulatory Visit: Payer: MEDICAID | Admitting: Dermatology

## 2024-02-17 ENCOUNTER — Ambulatory Visit (INDEPENDENT_AMBULATORY_CARE_PROVIDER_SITE_OTHER): Payer: MEDICAID | Admitting: Orthopedic Surgery

## 2024-02-17 DIAGNOSIS — I83012 Varicose veins of right lower extremity with ulcer of calf: Secondary | ICD-10-CM

## 2024-02-17 DIAGNOSIS — L97211 Non-pressure chronic ulcer of right calf limited to breakdown of skin: Secondary | ICD-10-CM

## 2024-02-17 DIAGNOSIS — M12561 Traumatic arthropathy, right knee: Secondary | ICD-10-CM

## 2024-02-18 ENCOUNTER — Encounter: Payer: Self-pay | Admitting: Orthopedic Surgery

## 2024-02-18 NOTE — Progress Notes (Signed)
 Office Visit Note   Patient: Evan [name]           Date of Birth: 03/17/1971           MRN: [id number] Visit Date: 02/17/2024              Requested by: Elenore Paddy, NP [address] [place],  [place] [address] PCP: Elenore Paddy, NP  Chief Complaint  Patient presents with   Right Leg - Wound Check      HPI: Patient is a 53 year old gentleman who is seen for 2 separate issues.  #1 patient has developed a new venous ulcer of right lower extremity.  Patient states that he was treated in our office had some final treatment performed at the wound center.  Patient states that he called the wound center and they told him since he has not been seen in 30 days he would be a new patient and he could not be seen until the end of March.  Patient also complains of traumatic arthritis right knee status post open reduction internal fixation bicondylar tibial plateau fracture with Dr. Jena Gauss.  Assessment & Plan: Visit Diagnoses:  1. Venous stasis ulcer of right calf limited to breakdown of skin with varicose veins (HCC)   2. Traumatic arthritis of right knee     Plan: Will apply a 3 layer compression wrap to the right lower extremity.  Discussed patient may be a candidate for biologic tissue graft.  Will obtain three-view radiographs of the right knee at follow-up.  Follow-Up Instructions: Return in about 1 week (around 02/24/2024).   Ortho Exam  Patient is alert, oriented, no adenopathy, well-dressed, normal affect, normal respiratory effort. Examination patient has a strong palpable dorsalis pedis pulse he does have acute swelling with a venous ulcer right lower extremity no arterial insufficiency.  The venous ulcer measures 4 x 4 cm anterior lateral and 1 mm deep.  Examination the right knee patient has decreased range of motion.  Patient is unable to walk down stairs.  Radiographs show traumatic arthritis.  No redness or cellulitis no signs of infection.  Imaging: No results  found. No images are attached to the encounter.  Labs: Lab Results  Component Value Date   HGBA1C 5.3 09/20/2023   HGBA1C 5.5 10/18/2022   HGBA1C 5.6 04/17/2019   ESRSEDRATE 72 (H) 05/19/2019   ESRSEDRATE 59 (H) 05/05/2019   ESRSEDRATE 75 (H) 04/17/2019   CRP 65.7 (H) 06/23/2019   CRP 61.5 (H) 06/04/2019   CRP 47.8 (H) 05/26/2019   LABURIC 5.6 03/26/2022   REPTSTATUS 04/01/2022 FINAL 03/26/2022   GRAMSTAIN  04/17/2019    RARE WBC PRESENT,BOTH PMN AND MONONUCLEAR NO ORGANISMS SEEN    CULT  03/26/2022    NO GROWTH 5 DAYS Performed at [facility], [address] [place], [place] [address]      Lab Results  Component Value Date   ALBUMIN 4.0 09/20/2023   ALBUMIN 4.4 10/18/2022   ALBUMIN 3.8 10/17/2020    No results found for: "MG" Lab Results  Component Value Date   VD25OH 32.0 04/17/2019    No results found for: "PREALBUMIN"    Latest Ref Rng & Units 09/20/2023    3:42 PM 10/18/2022    1:47 PM 03/28/2022    4:33 AM  CBC EXTENDED  WBC 4.0 - 10.5 K/uL 8.7  9.0  3.7   RBC 4.22 - 5.81 Mil/uL 4.81  4.89  3.87   Hemoglobin 13.0 -  17.0 g/dL 95.2  84.1  32.4   HCT 39.0 - 52.0 % 40.9  41.7  33.7   Platelets 150.0 - 400.0 K/uL 454.0  345.0  261      There is no height or weight on file to calculate BMI.  Orders:  No orders of the defined types were placed in this encounter.  No orders of the defined types were placed in this encounter.    Procedures: No procedures performed  Clinical Data: No additional findings.  ROS:  All other systems negative, except as noted in the HPI. Review of Systems  Objective: Vital Signs: There were no vitals taken for this visit.  Specialty Comments:  No specialty comments available.  PMFS History: Patient Active Problem List   Diagnosis Date Noted   Class 1 obesity with serious comorbidity and body mass index (BMI) of 33.0 to 33.9 in adult 09/20/2023   Panic disorder 10/18/2022   Class 2 obesity with body  mass index (BMI) of 36.0 to 36.9 in adult 10/18/2022   Neuropathy 10/18/2022   Venous stasis ulcer of calf limited to breakdown of skin with varicose veins (HCC)    Cellulitis 03/27/2022   AKI (acute kidney injury) (HCC) 03/26/2022   Multiple open wounds of lower leg 03/26/2022   Dyspnea on exertion 03/26/2022   Great toe pain, right 03/26/2022   IV drug abuse (HCC)    Septic arthritis of knee, right (HCC) 04/17/2019   Hardware complicating wound infection (HCC) 04/17/2019   Hepatitis C virus infection cured after antiviral drug therapy 04/17/2019   Acute lateral meniscus tear of right knee 01/23/2019   Displaced fracture of right tibial tuberosity, initial encounter for closed fracture 01/16/2019   Closed bicondylar fracture of right tibial plateau 01/15/2019   Cough 10/31/2016   Alcohol abuse with intoxication, with delirium (HCC) 10/03/2016   Attention deficit disorder (ADD) in adult 10/03/2016   Depression 06/15/2014   Acute bronchitis 01/28/2014   Chronic pain syndrome 01/28/2014   Hepatitis, chronic persistent (HCC) 05/14/2013   ADD (attention deficit disorder) 05/11/2013   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   Eczema 12/30/2012   Disorder of joints of multiple sites 10/02/2012   Acute and subacute liver necrosis 04/24/2012   Metastatic melanoma 10/17/2011   Chest pain 10/17/2011   Insomnia 12/20/2008   Anxiety state 12/10/2007   ERECTILE DYSFUNCTION 12/10/2007   Essential hypertension 11/21/2007   Past Medical History:  Diagnosis Date   ADD (attention deficit disorder) 05/11/2013   ANXIETY 12/10/2007   BACK PAIN 08/03/2009   Chronic pain syndrome 01/28/2014   Depression 06/15/2014   ERECTILE DYSFUNCTION 12/10/2007   Hepatitis C 05/15/2013     "treated and cured"   HYPERTENSION 11/21/2007   INSOMNIA-SLEEP DISORDER-UNSPEC 12/20/2008   Melanoma (HCC) 2015   Lymph nodes left side , radiation   Neuropathy    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    Preventative health care 05/14/2011    Family History  Problem Relation Age of Onset   Cancer Father        melanoma on back   Cancer Other        pancreatic    Past Surgical History:  Procedure Laterality Date   EXTERNAL FIXATION LEG Right 01/16/2019   Procedure: EXTERNAL FIXATION RIGHT KNEE;  Surgeon: Roby Lofts, MD;  Location: [facility];  Service: Orthopedics;  Laterality: Right;   HARDWARE REMOVAL Right 04/17/2019   Procedure: HARDWARE REMOVAL RIGHT TIBIA;  Surgeon: Roby Lofts, MD;  Location:  [facility];  Service: Orthopedics;  Laterality: Right;   IRRIGATION AND DEBRIDEMENT KNEE Right 04/17/2019   Procedure: IRRIGATION AND DEBRIDEMENT RIGHT KNEE;  Surgeon: Roby Lofts, MD;  Location: [facility];  Service: Orthopedics;  Laterality: Right;   LYMPHADENECTOMY Left    ORIF TIBIA PLATEAU Right 01/20/2019   Procedure: OPEN REDUCTION INTERNAL FIXATION (ORIF) TIBIAL PLATEAU;  Surgeon: Roby Lofts, MD;  Location: [facility];  Service: Orthopedics;  Laterality: Right;   SHOULDER SURGERY Left    chronic L dislocating    Social History   Occupational History   Occupation: works in [employer]  Tobacco Use   Smoking status: Former    Current packs/day: 0.00    Types: Cigarettes    Quit date: 2000    Years since quitting: 25.1   Smokeless tobacco: Never   Tobacco comments:    in college  Vaping Use   Vaping status: Never Used  Substance and Sexual Activity   Alcohol use: Not Currently   Drug use: No   Sexual activity: Not on file

## 2024-02-24 ENCOUNTER — Ambulatory Visit (INDEPENDENT_AMBULATORY_CARE_PROVIDER_SITE_OTHER): Payer: MEDICAID | Admitting: Orthopedic Surgery

## 2024-02-24 DIAGNOSIS — I83012 Varicose veins of right lower extremity with ulcer of calf: Secondary | ICD-10-CM

## 2024-02-24 DIAGNOSIS — L97211 Non-pressure chronic ulcer of right calf limited to breakdown of skin: Secondary | ICD-10-CM | POA: Diagnosis not present

## 2024-02-25 ENCOUNTER — Encounter: Payer: Self-pay | Admitting: Orthopedic Surgery

## 2024-02-25 ENCOUNTER — Other Ambulatory Visit: Payer: MEDICAID

## 2024-02-25 NOTE — Progress Notes (Signed)
 Office Visit Note   Patient: Evan [name]           Date of Birth: [birth year]           MRN: [id number] Visit Date: 02/24/2024              Requested by: Elenore Paddy, NP [address] [place],  [place] [address] PCP: Elenore Paddy, NP  Chief Complaint  Patient presents with   Right Leg - Follow-up      HPI: Patient is a 53 year old gentleman with a venous insufficiency ulcer right lower extremity patient has been undergoing serial compression wraps.  There has been minimal change in the ulcer size.  Assessment & Plan: Visit Diagnoses:  1. Venous stasis ulcer of right calf limited to breakdown of skin with varicose veins (HCC)     Plan: The wound was debrided Kerecis micro graft was applied plan for leaving the Adaptic in place change the gauze daily and Ace wrap daily.  Patient states he would like to be enrolled in the [facility] 2 tissue graft study.  Follow-Up Instructions: Return in about 1 week (around 03/02/2024).   Ortho Exam  Patient is alert, oriented, no adenopathy, well-dressed, normal affect, normal respiratory effort. Examination patient is a palpable dorsalis pedis pulse no arterial insufficiency.  Patient has improved wrinkling of the skin there is brawny skin color changes from the venous insufficiency.  The ulcer is essentially unchanged.  After informed consent the ulcer was debrided Kerecis micro graft applied and a compression dressing applied.  The wound healing has stalled, the wound bed has healthy granulation tissue, and patient presents for evaluation and application of Kerecis MariGen Micro graft. After informed consent a 10 blade knife was used to debride the skin and soft tissue to healthy viable bleeding granulation tissue.  Silver nitrate was used for hemostasis. The wound measures: 2 cm in length, 2cm  in width, 2 mm in depth, wound location anterior lateral right calf Kerecis MariGen micro tissue graft 4 cm2 was applied, and there was no  wastage.  Please see the photo below of the Lot number and expiration date. The micro tissue graft was covered with a nonadherent Adaptic dressing, bolstered with 4 x 4 gauze and secured with a compression wrap.     Imaging: No results found.   Labs: Lab Results  Component Value Date   HGBA1C 5.3 09/20/2023   HGBA1C 5.5 10/18/2022   HGBA1C 5.6 04/17/2019   ESRSEDRATE 72 (H) 05/19/2019   ESRSEDRATE 59 (H) 05/05/2019   ESRSEDRATE 75 (H) 04/17/2019   CRP 65.7 (H) 06/23/2019   CRP 61.5 (H) 06/04/2019   CRP 47.8 (H) 05/26/2019   LABURIC 5.6 03/26/2022   REPTSTATUS 04/01/2022 FINAL 03/26/2022   GRAMSTAIN  04/17/2019    RARE WBC PRESENT,BOTH PMN AND MONONUCLEAR NO ORGANISMS SEEN    CULT  03/26/2022    NO GROWTH 5 DAYS Performed at [facility], [address], [place], [place] [address]      Lab Results  Component Value Date   ALBUMIN 4.0 09/20/2023   ALBUMIN 4.4 10/18/2022   ALBUMIN 3.8 10/17/2020    No results found for: "MG" Lab Results  Component Value Date   VD25OH 32.0 04/17/2019    No results found for: "PREALBUMIN"    Latest Ref Rng & Units 09/20/2023    3:42 PM 10/18/2022    1:47 PM 03/28/2022    4:33 AM  CBC EXTENDED  WBC 4.0 -  10.5 K/uL 8.7  9.0  3.7   RBC 4.22 - 5.81 Mil/uL 4.81  4.89  3.87   Hemoglobin 13.0 - 17.0 g/dL 84.1  32.4  40.1   HCT 39.0 - 52.0 % 40.9  41.7  33.7   Platelets 150.0 - 400.0 K/uL 454.0  345.0  261      There is no height or weight on file to calculate BMI.  Orders:  No orders of the defined types were placed in this encounter.  No orders of the defined types were placed in this encounter.    Procedures: No procedures performed  Clinical Data: No additional findings.  ROS:  All other systems negative, except as noted in the HPI. Review of Systems  Objective: Vital Signs: There were no vitals taken for this visit.  Specialty Comments:  No specialty comments available.  PMFS History: Patient Active  Problem List   Diagnosis Date Noted   Class 1 obesity with serious comorbidity and body mass index (BMI) of 33.0 to 33.9 in adult 09/20/2023   Panic disorder 10/18/2022   Class 2 obesity with body mass index (BMI) of 36.0 to 36.9 in adult 10/18/2022   Neuropathy 10/18/2022   Venous stasis ulcer of calf limited to breakdown of skin with varicose veins (HCC)    Cellulitis 03/27/2022   AKI (acute kidney injury) (HCC) 03/26/2022   Multiple open wounds of lower leg 03/26/2022   Dyspnea on exertion 03/26/2022   Great toe pain, right 03/26/2022   IV drug abuse (HCC)    Septic arthritis of knee, right (HCC) 04/17/2019   Hardware complicating wound infection (HCC) 04/17/2019   Hepatitis C virus infection cured after antiviral drug therapy 04/17/2019   Acute lateral meniscus tear of right knee 01/23/2019   Displaced fracture of right tibial tuberosity, initial encounter for closed fracture 01/16/2019   Closed bicondylar fracture of right tibial plateau 01/15/2019   Cough 10/31/2016   Alcohol abuse with intoxication, with delirium (HCC) 10/03/2016   Attention deficit disorder (ADD) in adult 10/03/2016   Depression 06/15/2014   Acute bronchitis 01/28/2014   Chronic pain syndrome 01/28/2014   Hepatitis, chronic persistent (HCC) 05/14/2013   ADD (attention deficit disorder) 05/11/2013   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   Eczema 12/30/2012   Disorder of joints of multiple sites 10/02/2012   Acute and subacute liver necrosis 04/24/2012   Metastatic melanoma 10/17/2011   Chest pain 10/17/2011   Insomnia 12/20/2008   Anxiety state 12/10/2007   ERECTILE DYSFUNCTION 12/10/2007   Essential hypertension 11/21/2007   Past Medical History:  Diagnosis Date   ADD (attention deficit disorder) 05/11/2013   ANXIETY 12/10/2007   BACK PAIN 08/03/2009   Chronic pain syndrome 01/28/2014   Depression 06/15/2014   ERECTILE DYSFUNCTION 12/10/2007   Hepatitis C 05/15/2013     "treated and cured"    HYPERTENSION 11/21/2007   INSOMNIA-SLEEP DISORDER-UNSPEC 12/20/2008   Melanoma (HCC) 2015   Lymph nodes left side , radiation   Neuropathy    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   Preventative health care 05/14/2011    Family History  Problem Relation Age of Onset   Cancer Father        melanoma on back   Cancer Other        pancreatic    Past Surgical History:  Procedure Laterality Date   EXTERNAL FIXATION LEG Right 01/16/2019   Procedure: EXTERNAL FIXATION RIGHT KNEE;  Surgeon: Roby Lofts, MD;  [facility];  Service: Orthopedics;  Laterality: Right;   HARDWARE REMOVAL Right 04/17/2019   Procedure: HARDWARE REMOVAL RIGHT TIBIA;  Surgeon: Roby Lofts, MD;  [facility]: [facility];  Service: Orthopedics;  Laterality: Right;   IRRIGATION AND DEBRIDEMENT KNEE Right 04/17/2019   Procedure: IRRIGATION AND DEBRIDEMENT RIGHT KNEE;  Surgeon: Roby Lofts, MD;  [facility];  Service: Orthopedics;  Laterality: Right;   LYMPHADENECTOMY Left    ORIF TIBIA PLATEAU Right 01/20/2019   Procedure: OPEN REDUCTION INTERNAL FIXATION (ORIF) TIBIAL PLATEAU;  Surgeon: Roby Lofts, MD;  Location: [facility];  Service: Orthopedics;  Laterality: Right;   SHOULDER SURGERY Left    chronic L dislocating    Social History   Occupational History   Occupation: works in [employer]  Tobacco Use   Smoking status: Former    Current packs/day: 0.00    Types: Cigarettes    Quit date: 2000    Years since quitting: 25.1   Smokeless tobacco: Never   Tobacco comments:    in college  Vaping Use   Vaping status: Never Used  Substance and Sexual Activity   Alcohol use: Not Currently   Drug use: No   Sexual activity: Not on file

## 2024-03-02 ENCOUNTER — Ambulatory Visit (INDEPENDENT_AMBULATORY_CARE_PROVIDER_SITE_OTHER): Payer: MEDICAID | Admitting: Orthopedic Surgery

## 2024-03-02 ENCOUNTER — Encounter: Payer: Self-pay | Admitting: Orthopedic Surgery

## 2024-03-02 DIAGNOSIS — L97211 Non-pressure chronic ulcer of right calf limited to breakdown of skin: Secondary | ICD-10-CM | POA: Diagnosis not present

## 2024-03-02 DIAGNOSIS — I83012 Varicose veins of right lower extremity with ulcer of calf: Secondary | ICD-10-CM

## 2024-03-02 DIAGNOSIS — I890 Lymphedema, not elsewhere classified: Secondary | ICD-10-CM | POA: Diagnosis not present

## 2024-03-02 NOTE — Progress Notes (Signed)
 Office Visit Note   Patient: Evan [name]           Date of Birth: [birth year]           MRN: [id number] Visit Date: 03/02/2024              Requested by: Elenore Paddy, NP [address] [place],  [place] [address] PCP: Elenore Paddy, NP  Chief Complaint  Patient presents with   Right Leg - Follow-up      HPI: Patient is a 53 year old gentleman who is status post donated tissue graft of Kerecis to the right lower extremity venous ulcer.  Patient states years ago he had allergic reaction to fish.  Patient states he has had no reaction to the tissue graft.  Assessment & Plan: Visit Diagnoses:  1. Venous stasis ulcer of right calf limited to breakdown of skin with varicose veins (HCC)   2. Lymphedema     Plan: Will apply Dynaflex compression wrap today follow-up in 1 week.  Anticipate advancing to compression socks at follow-up.  Follow-Up Instructions: Return in about 1 week (around 03/09/2024).   Ortho Exam  Patient is alert, oriented, no adenopathy, well-dressed, normal affect, normal respiratory effort. Examination the wound has excellent flat granulation tissue.  It measures 1 cm x 1 cm and is flat.  Imaging: No results found.   Labs: Lab Results  Component Value Date   HGBA1C 5.3 09/20/2023   HGBA1C 5.5 10/18/2022   HGBA1C 5.6 04/17/2019   ESRSEDRATE 72 (H) 05/19/2019   ESRSEDRATE 59 (H) 05/05/2019   ESRSEDRATE 75 (H) 04/17/2019   CRP 65.7 (H) 06/23/2019   CRP 61.5 (H) 06/04/2019   CRP 47.8 (H) 05/26/2019   LABURIC 5.6 03/26/2022   REPTSTATUS 04/01/2022 FINAL 03/26/2022   GRAMSTAIN  04/17/2019    RARE WBC PRESENT,BOTH PMN AND MONONUCLEAR NO ORGANISMS SEEN    CULT  03/26/2022    NO GROWTH 5 DAYS Performed at [facility], [address], [place], [place] [address]      Lab Results  Component Value Date   ALBUMIN 4.0 09/20/2023   ALBUMIN 4.4 10/18/2022   ALBUMIN 3.8 10/17/2020    No results found for: "MG" Lab Results  Component  Value Date   VD25OH 32.0 04/17/2019    No results found for: "PREALBUMIN"    Latest Ref Rng & Units 09/20/2023    3:42 PM 10/18/2022    1:47 PM 03/28/2022    4:33 AM  CBC EXTENDED  WBC 4.0 - 10.5 K/uL 8.7  9.0  3.7   RBC 4.22 - 5.81 Mil/uL 4.81  4.89  3.87   Hemoglobin 13.0 - 17.0 g/dL 82.9  56.2  13.0   HCT 39.0 - 52.0 % 40.9  41.7  33.7   Platelets 150.0 - 400.0 K/uL 454.0  345.0  261      There is no height or weight on file to calculate BMI.  Orders:  No orders of the defined types were placed in this encounter.  No orders of the defined types were placed in this encounter.    Procedures: No procedures performed  Clinical Data: No additional findings.  ROS:  All other systems negative, except as noted in the HPI. Review of Systems  Objective: Vital Signs: There were no vitals taken for this visit.  Specialty Comments:  No specialty comments available.  PMFS History: Patient Active Problem List   Diagnosis Date Noted   Class 1 obesity with  serious comorbidity and body mass index (BMI) of 33.0 to 33.9 in adult 09/20/2023   Panic disorder 10/18/2022   Class 2 obesity with body mass index (BMI) of 36.0 to 36.9 in adult 10/18/2022   Neuropathy 10/18/2022   Venous stasis ulcer of calf limited to breakdown of skin with varicose veins (HCC)    Cellulitis 03/27/2022   AKI (acute kidney injury) (HCC) 03/26/2022   Multiple open wounds of lower leg 03/26/2022   Dyspnea on exertion 03/26/2022   Great toe pain, right 03/26/2022   IV drug abuse (HCC)    Septic arthritis of knee, right (HCC) 04/17/2019   Hardware complicating wound infection (HCC) 04/17/2019   Hepatitis C virus infection cured after antiviral drug therapy 04/17/2019   Acute lateral meniscus tear of right knee 01/23/2019   Displaced fracture of right tibial tuberosity, initial encounter for closed fracture 01/16/2019   Closed bicondylar fracture of right tibial plateau 01/15/2019   Cough 10/31/2016    Alcohol abuse with intoxication, with delirium (HCC) 10/03/2016   Attention deficit disorder (ADD) in adult 10/03/2016   Depression 06/15/2014   Acute bronchitis 01/28/2014   Chronic pain syndrome 01/28/2014   Hepatitis, chronic persistent (HCC) 05/14/2013   ADD (attention deficit disorder) 05/11/2013   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   Eczema 12/30/2012   Disorder of joints of multiple sites 10/02/2012   Acute and subacute liver necrosis 04/24/2012   Metastatic melanoma 10/17/2011   Chest pain 10/17/2011   Insomnia 12/20/2008   Anxiety state 12/10/2007   ERECTILE DYSFUNCTION 12/10/2007   Essential hypertension 11/21/2007   Past Medical History:  Diagnosis Date   ADD (attention deficit disorder) 05/11/2013   ANXIETY 12/10/2007   BACK PAIN 08/03/2009   Chronic pain syndrome 01/28/2014   Depression 06/15/2014   ERECTILE DYSFUNCTION 12/10/2007   Hepatitis C 05/15/2013     "treated and cured"   HYPERTENSION 11/21/2007   INSOMNIA-SLEEP DISORDER-UNSPEC 12/20/2008   Melanoma (HCC) 2015   Lymph nodes left side , radiation   Neuropathy    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   Preventative health care 05/14/2011    Family History  Problem Relation Age of Onset   Cancer Father        melanoma on back   Cancer Other        pancreatic    Past Surgical History:  Procedure Laterality Date   EXTERNAL FIXATION LEG Right 01/16/2019   Procedure: EXTERNAL FIXATION RIGHT KNEE;  Surgeon: Roby Lofts, MD;  [facility];  Service: Orthopedics;  Laterality: Right;   HARDWARE REMOVAL Right 04/17/2019   Procedure: HARDWARE REMOVAL RIGHT TIBIA;  Surgeon: Roby Lofts, MD;  [facility];  Service: Orthopedics;  Laterality: Right;   IRRIGATION AND DEBRIDEMENT KNEE Right 04/17/2019   Procedure: IRRIGATION AND DEBRIDEMENT RIGHT KNEE;  Surgeon: Roby Lofts, MD;  Location: [facility];  Service: Orthopedics;  Laterality: Right;   LYMPHADENECTOMY Left    ORIF TIBIA  PLATEAU Right 01/20/2019   Procedure: OPEN REDUCTION INTERNAL FIXATION (ORIF) TIBIAL PLATEAU;  Surgeon: Roby Lofts, MD;  Location: [facility];  Service: Orthopedics;  Laterality: Right;   SHOULDER SURGERY Left    chronic L dislocating    Social History   Occupational History   Occupation: works in [employer]  Tobacco Use   Smoking status: Former    Current packs/day: 0.00    Types: Cigarettes    Quit date: 2000    Years since quitting: 25.2   Smokeless tobacco:  Never   Tobacco comments:    in college  Vaping Use   Vaping status: Never Used  Substance and Sexual Activity   Alcohol use: Not Currently   Drug use: No   Sexual activity: Not on file

## 2024-03-03 ENCOUNTER — Ambulatory Visit: Payer: MEDICAID | Admitting: Internal Medicine

## 2024-03-09 ENCOUNTER — Ambulatory Visit: Payer: MEDICAID | Admitting: Orthopedic Surgery

## 2024-03-09 ENCOUNTER — Ambulatory Visit (INDEPENDENT_AMBULATORY_CARE_PROVIDER_SITE_OTHER): Payer: MEDICAID | Admitting: Orthopedic Surgery

## 2024-03-09 DIAGNOSIS — L97211 Non-pressure chronic ulcer of right calf limited to breakdown of skin: Secondary | ICD-10-CM

## 2024-03-09 DIAGNOSIS — I83012 Varicose veins of right lower extremity with ulcer of calf: Secondary | ICD-10-CM | POA: Diagnosis not present

## 2024-03-10 ENCOUNTER — Encounter: Payer: Self-pay | Admitting: Orthopedic Surgery

## 2024-03-10 NOTE — Progress Notes (Signed)
 Office Visit Note   Patient: Evan [name]           Date of Birth: 11-12-1971           MRN: [id number] Visit Date: 03/09/2024              Requested by: Elenore Paddy, NP [address] [place],  [place] [address] PCP: Elenore Paddy, NP  Chief Complaint  Patient presents with   Right Leg - Follow-up      HPI: Patient is a 53 year old gentleman who is seen in follow-up for venous insufficiency ulcer right lower extremity.  Patient removed his Dynaflex wrap last night.  Assessment & Plan: Visit Diagnoses:  1. Venous stasis ulcer of right calf limited to breakdown of skin with varicose veins (HCC)     Plan: Will reapply Dynaflex wrap for 1 additional week and then transition to a compression sock.  Follow-Up Instructions: Return in about 1 week (around 03/16/2024).   Ortho Exam  Patient is alert, oriented, no adenopathy, well-dressed, normal affect, normal respiratory effort. Examination there is a scab over the wound.  There is no cellulitis no drainage.  The edges of the scab were removed and there is good epithelization.  Imaging: No results found.   Labs: Lab Results  Component Value Date   HGBA1C 5.3 09/20/2023   HGBA1C 5.5 10/18/2022   HGBA1C 5.6 04/17/2019   ESRSEDRATE 72 (H) 05/19/2019   ESRSEDRATE 59 (H) 05/05/2019   ESRSEDRATE 75 (H) 04/17/2019   CRP 65.7 (H) 06/23/2019   CRP 61.5 (H) 06/04/2019   CRP 47.8 (H) 05/26/2019   LABURIC 5.6 03/26/2022   REPTSTATUS 04/01/2022 FINAL 03/26/2022   GRAMSTAIN  04/17/2019    RARE WBC PRESENT,BOTH PMN AND MONONUCLEAR NO ORGANISMS SEEN    CULT  03/26/2022    NO GROWTH 5 DAYS Performed at [facility], [address] [place], [place] [address]      Lab Results  Component Value Date   ALBUMIN 4.0 09/20/2023   ALBUMIN 4.4 10/18/2022   ALBUMIN 3.8 10/17/2020    No results found for: "MG" Lab Results  Component Value Date   VD25OH 32.0 04/17/2019    No results found for: "PREALBUMIN"     Latest Ref Rng & Units 09/20/2023    3:42 PM 10/18/2022    1:47 PM 03/28/2022    4:33 AM  CBC EXTENDED  WBC 4.0 - 10.5 K/uL 8.7  9.0  3.7   RBC 4.22 - 5.81 Mil/uL 4.81  4.89  3.87   Hemoglobin 13.0 - 17.0 g/dL 82.9  56.2  13.0   HCT 39.0 - 52.0 % 40.9  41.7  33.7   Platelets 150.0 - 400.0 K/uL 454.0  345.0  261      There is no height or weight on file to calculate BMI.  Orders:  No orders of the defined types were placed in this encounter.  No orders of the defined types were placed in this encounter.    Procedures: No procedures performed  Clinical Data: No additional findings.  ROS:  All other systems negative, except as noted in the HPI. Review of Systems  Objective: Vital Signs: There were no vitals taken for this visit.  Specialty Comments:  No specialty comments available.  PMFS History: Patient Active Problem List   Diagnosis Date Noted   Class 1 obesity with serious comorbidity and body mass index (BMI) of 33.0 to 33.9 in adult 09/20/2023   Panic  disorder 10/18/2022   Class 2 obesity with body mass index (BMI) of 36.0 to 36.9 in adult 10/18/2022   Neuropathy 10/18/2022   Venous stasis ulcer of calf limited to breakdown of skin with varicose veins (HCC)    Cellulitis 03/27/2022   AKI (acute kidney injury) (HCC) 03/26/2022   Multiple open wounds of lower leg 03/26/2022   Dyspnea on exertion 03/26/2022   Great toe pain, right 03/26/2022   IV drug abuse (HCC)    Septic arthritis of knee, right (HCC) 04/17/2019   Hardware complicating wound infection (HCC) 04/17/2019   Hepatitis C virus infection cured after antiviral drug therapy 04/17/2019   Acute lateral meniscus tear of right knee 01/23/2019   Displaced fracture of right tibial tuberosity, initial encounter for closed fracture 01/16/2019   Closed bicondylar fracture of right tibial plateau 01/15/2019   Cough 10/31/2016   Alcohol abuse with intoxication, with delirium (HCC) 10/03/2016   Attention  deficit disorder (ADD) in adult 10/03/2016   Depression 06/15/2014   Acute bronchitis 01/28/2014   Chronic pain syndrome 01/28/2014   Hepatitis, chronic persistent (HCC) 05/14/2013   ADD (attention deficit disorder) 05/11/2013   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   Eczema 12/30/2012   Disorder of joints of multiple sites 10/02/2012   Acute and subacute liver necrosis 04/24/2012   Metastatic melanoma 10/17/2011   Chest pain 10/17/2011   Insomnia 12/20/2008   Anxiety state 12/10/2007   ERECTILE DYSFUNCTION 12/10/2007   Essential hypertension 11/21/2007   Past Medical History:  Diagnosis Date   ADD (attention deficit disorder) 05/11/2013   ANXIETY 12/10/2007   BACK PAIN 08/03/2009   Chronic pain syndrome 01/28/2014   Depression 06/15/2014   ERECTILE DYSFUNCTION 12/10/2007   Hepatitis C 05/15/2013     "treated and cured"   HYPERTENSION 11/21/2007   INSOMNIA-SLEEP DISORDER-UNSPEC 12/20/2008   Melanoma (HCC) 2015   Lymph nodes left side , radiation   Neuropathy    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   Preventative health care 05/14/2011    Family History  Problem Relation Age of Onset   Cancer Father        melanoma on back   Cancer Other        pancreatic    Past Surgical History:  Procedure Laterality Date   EXTERNAL FIXATION LEG Right 01/16/2019   Procedure: EXTERNAL FIXATION RIGHT KNEE;  Surgeon: Roby Lofts, MD;  Location: [facility] OR;  Service: Orthopedics;  Laterality: Right;   HARDWARE REMOVAL Right 04/17/2019   Procedure: HARDWARE REMOVAL RIGHT TIBIA;  Surgeon: Roby Lofts, MD;  Location: [facility] OR;  Service: Orthopedics;  Laterality: Right;   IRRIGATION AND DEBRIDEMENT KNEE Right 04/17/2019   Procedure: IRRIGATION AND DEBRIDEMENT RIGHT KNEE;  Surgeon: Roby Lofts, MD;  Location: [facility] OR;  Service: Orthopedics;  Laterality: Right;   LYMPHADENECTOMY Left    ORIF TIBIA PLATEAU Right 01/20/2019   Procedure: OPEN REDUCTION INTERNAL FIXATION (ORIF)  TIBIAL PLATEAU;  Surgeon: Roby Lofts, MD;  Location: [facility] OR;  Service: Orthopedics;  Laterality: Right;   SHOULDER SURGERY Left    chronic L dislocating    Social History   Occupational History   Occupation: works in [employer]  Tobacco Use   Smoking status: Former    Current packs/day: 0.00    Types: Cigarettes    Quit date: 2000    Years since quitting: 25.2   Smokeless tobacco: Never   Tobacco comments:    in college  Vaping Use   Vaping status:  Never Used  Substance and Sexual Activity   Alcohol use: Not Currently   Drug use: No   Sexual activity: Not on file

## 2024-03-16 ENCOUNTER — Telehealth: Payer: Self-pay

## 2024-03-16 ENCOUNTER — Ambulatory Visit: Payer: MEDICAID | Admitting: Orthopedic Surgery

## 2024-03-16 NOTE — Telephone Encounter (Signed)
 Patient would like a CB concerning compression wrap.  Couldn't make it to his appointment today due to not feeling well.  CB# [phone].  Please advise.  Thank you.

## 2024-03-16 NOTE — Telephone Encounter (Signed)
 [email or web address]

## 2024-03-25 NOTE — Telephone Encounter (Signed)
 SW pt, he has taken his wrap off. Says wound looks good. Has a scab over top. He has been scheduled on Monday 03/30/24 per his request due to transportation.

## 2024-03-30 ENCOUNTER — Ambulatory Visit (INDEPENDENT_AMBULATORY_CARE_PROVIDER_SITE_OTHER): Payer: MEDICAID | Admitting: Orthopedic Surgery

## 2024-03-30 ENCOUNTER — Encounter: Payer: Self-pay | Admitting: Orthopedic Surgery

## 2024-03-30 DIAGNOSIS — I890 Lymphedema, not elsewhere classified: Secondary | ICD-10-CM | POA: Diagnosis not present

## 2024-03-30 DIAGNOSIS — I83012 Varicose veins of right lower extremity with ulcer of calf: Secondary | ICD-10-CM

## 2024-03-30 DIAGNOSIS — L97211 Non-pressure chronic ulcer of right calf limited to breakdown of skin: Secondary | ICD-10-CM | POA: Diagnosis not present

## 2024-03-30 NOTE — Progress Notes (Signed)
 Office Visit Note   Patient: Evan [name]           Date of Birth: [birth year]           MRN: [id number] Visit Date: 03/30/2024              Requested by: Elenore Paddy, NP [address] [place],  [place] [address] PCP: Elenore Paddy, NP  Chief Complaint  Patient presents with   Right Leg - Follow-up      HPI: Patient is a 53 year old gentleman who is status post treatment for venous ulceration right lower extremity.  Patient states he remove the Dynaflex wrap last week on his own.  Assessment & Plan: Visit Diagnoses:  1. Venous stasis ulcer of right calf limited to breakdown of skin with varicose veins (HCC)   2. Lymphedema     Plan: The ulcer is completely healed.  Recommended a size large or extra-large compression sock to be worn daily.  Recommended a gym membership for strengthening and aerobic conditioning.  Follow-Up Instructions: Return if symptoms worsen or fail to improve.   Ortho Exam  Patient is alert, oriented, no adenopathy, well-dressed, normal affect, normal respiratory effort. Examination patient's calf is 38 cm in circumference he does have brawny skin color changes there is no cellulitis no drainage.  The scab is removed and the ulcer has completely healed.  Patient states he does have globalized weakness and both lower extremities.  Imaging: No results found.     Labs: Lab Results  Component Value Date   HGBA1C 5.3 09/20/2023   HGBA1C 5.5 10/18/2022   HGBA1C 5.6 04/17/2019   ESRSEDRATE 72 (H) 05/19/2019   ESRSEDRATE 59 (H) 05/05/2019   ESRSEDRATE 75 (H) 04/17/2019   CRP 65.7 (H) 06/23/2019   CRP 61.5 (H) 06/04/2019   CRP 47.8 (H) 05/26/2019   LABURIC 5.6 03/26/2022   REPTSTATUS 04/01/2022 FINAL 03/26/2022   GRAMSTAIN  04/17/2019    RARE WBC PRESENT,BOTH PMN AND MONONUCLEAR NO ORGANISMS SEEN    CULT  03/26/2022    NO GROWTH 5 DAYS Performed at [facility], [address] [place], [place] [address]      Lab Results   Component Value Date   ALBUMIN 4.0 09/20/2023   ALBUMIN 4.4 10/18/2022   ALBUMIN 3.8 10/17/2020    No results found for: "MG" Lab Results  Component Value Date   VD25OH 32.0 04/17/2019    No results found for: "PREALBUMIN"    Latest Ref Rng & Units 09/20/2023    3:42 PM 10/18/2022    1:47 PM 03/28/2022    4:33 AM  CBC EXTENDED  WBC 4.0 - 10.5 K/uL 8.7  9.0  3.7   RBC 4.22 - 5.81 Mil/uL 4.81  4.89  3.87   Hemoglobin 13.0 - 17.0 g/dL 96.2  95.2  84.1   HCT 39.0 - 52.0 % 40.9  41.7  33.7   Platelets 150.0 - 400.0 K/uL 454.0  345.0  261      There is no height or weight on file to calculate BMI.  Orders:  No orders of the defined types were placed in this encounter.  No orders of the defined types were placed in this encounter.    Procedures: No procedures performed  Clinical Data: No additional findings.  ROS:  All other systems negative, except as noted in the HPI. Review of Systems  Objective: Vital Signs: There were no vitals taken for this visit.  Specialty Comments:  No specialty comments available.  PMFS History: Patient Active Problem List   Diagnosis Date Noted   Class 1 obesity with serious comorbidity and body mass index (BMI) of 33.0 to 33.9 in adult 09/20/2023   Panic disorder 10/18/2022   Class 2 obesity with body mass index (BMI) of 36.0 to 36.9 in adult 10/18/2022   Neuropathy 10/18/2022   Venous stasis ulcer of calf limited to breakdown of skin with varicose veins (HCC)    Cellulitis 03/27/2022   AKI (acute kidney injury) (HCC) 03/26/2022   Multiple open wounds of lower leg 03/26/2022   Dyspnea on exertion 03/26/2022   Great toe pain, right 03/26/2022   IV drug abuse (HCC)    Septic arthritis of knee, right (HCC) 04/17/2019   Hardware complicating wound infection (HCC) 04/17/2019   Hepatitis C virus infection cured after antiviral drug therapy 04/17/2019   Acute lateral meniscus tear of right knee 01/23/2019   Displaced fracture of  right tibial tuberosity, initial encounter for closed fracture 01/16/2019   Closed bicondylar fracture of right tibial plateau 01/15/2019   Cough 10/31/2016   Alcohol abuse with intoxication, with delirium (HCC) 10/03/2016   Attention deficit disorder (ADD) in adult 10/03/2016   Depression 06/15/2014   Acute bronchitis 01/28/2014   Chronic pain syndrome 01/28/2014   Hepatitis, chronic persistent (HCC) 05/14/2013   ADD (attention deficit disorder) 05/11/2013   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   Eczema 12/30/2012   Disorder of joints of multiple sites 10/02/2012   Acute and subacute liver necrosis 04/24/2012   Metastatic melanoma 10/17/2011   Chest pain 10/17/2011   Insomnia 12/20/2008   Anxiety state 12/10/2007   ERECTILE DYSFUNCTION 12/10/2007   Essential hypertension 11/21/2007   Past Medical History:  Diagnosis Date   ADD (attention deficit disorder) 05/11/2013   ANXIETY 12/10/2007   BACK PAIN 08/03/2009   Chronic pain syndrome 01/28/2014   Depression 06/15/2014   ERECTILE DYSFUNCTION 12/10/2007   Hepatitis C 05/15/2013     "treated and cured"   HYPERTENSION 11/21/2007   INSOMNIA-SLEEP DISORDER-UNSPEC 12/20/2008   Melanoma (HCC) 2015   Lymph nodes left side , radiation   Neuropathy    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   Preventative health care 05/14/2011    Family History  Problem Relation Age of Onset   Cancer Father        melanoma on back   Cancer Other        pancreatic    Past Surgical History:  Procedure Laterality Date   EXTERNAL FIXATION LEG Right 01/16/2019   Procedure: EXTERNAL FIXATION RIGHT KNEE;  Surgeon: Roby Lofts, MD;  Location: [facility];  Service: Orthopedics;  Laterality: Right;   HARDWARE REMOVAL Right 04/17/2019   Procedure: HARDWARE REMOVAL RIGHT TIBIA;  Surgeon: Roby Lofts, MD;  Location: [facility];  Service: Orthopedics;  Laterality: Right;   IRRIGATION AND DEBRIDEMENT KNEE Right 04/17/2019   Procedure: IRRIGATION  AND DEBRIDEMENT RIGHT KNEE;  Surgeon: Roby Lofts, MD;  Location: [facility];  Service: Orthopedics;  Laterality: Right;   LYMPHADENECTOMY Left    ORIF TIBIA PLATEAU Right 01/20/2019   Procedure: OPEN REDUCTION INTERNAL FIXATION (ORIF) TIBIAL PLATEAU;  Surgeon: Roby Lofts, MD;  Location: [facility];  Service: Orthopedics;  Laterality: Right;   SHOULDER SURGERY Left    chronic L dislocating    Social History   Occupational History   Occupation: works in [employer]  Tobacco Use   Smoking status: Former    Current packs/day: 0.00  Types: Cigarettes    Quit date: 2000    Years since quitting: 25.2   Smokeless tobacco: Never   Tobacco comments:    in college  Vaping Use   Vaping status: Never Used  Substance and Sexual Activity   Alcohol use: Not Currently   Drug use: No   Sexual activity: Not on file

## 2024-04-08 ENCOUNTER — Ambulatory Visit: Payer: MEDICAID | Admitting: Nurse Practitioner

## 2024-04-08 ENCOUNTER — Telehealth: Payer: Self-pay | Admitting: Nurse Practitioner

## 2024-04-08 NOTE — Telephone Encounter (Signed)
 Pt had concerns about why he couldn't receive his Losartan and he has done his  blood work as requested. Pt  also stated his blood pressure has been a little bit elevated and pt is coming in tomorrow.

## 2024-04-09 ENCOUNTER — Ambulatory Visit (INDEPENDENT_AMBULATORY_CARE_PROVIDER_SITE_OTHER): Payer: MEDICAID | Admitting: Nurse Practitioner

## 2024-04-09 ENCOUNTER — Other Ambulatory Visit (HOSPITAL_COMMUNITY): Payer: Self-pay

## 2024-04-09 VITALS — HR 94 | Temp 97.9°F | Ht 75.0 in | Wt 267.1 lb

## 2024-04-09 DIAGNOSIS — I10 Essential (primary) hypertension: Secondary | ICD-10-CM | POA: Diagnosis not present

## 2024-04-09 DIAGNOSIS — G4700 Insomnia, unspecified: Secondary | ICD-10-CM

## 2024-04-09 LAB — BASIC METABOLIC PANEL WITH GFR
BUN: 22 mg/dL (ref 6–23)
CO2: 24 meq/L (ref 19–32)
Calcium: 8.7 mg/dL (ref 8.4–10.5)
Chloride: 107 meq/L (ref 96–112)
Creatinine, Ser: 1.05 mg/dL (ref 0.40–1.50)
GFR: 82.74 mL/min (ref >60.00)
Glucose, Bld: 88 mg/dL (ref 70–99)
Potassium: 3.8 meq/L (ref 3.5–5.1)
Sodium: 139 meq/L (ref 135–145)

## 2024-04-09 MED ORDER — LOSARTAN POTASSIUM 50 MG PO TABS
50.0000 mg | ORAL_TABLET | Freq: Every day | ORAL | 1 refills | Status: DC
Start: 2024-04-09 — End: 2024-04-09

## 2024-04-09 MED ORDER — LOSARTAN POTASSIUM 50 MG PO TABS
50.0000 mg | ORAL_TABLET | Freq: Every day | ORAL | 1 refills | Status: DC
Start: 2024-04-09 — End: 2024-09-29
  Filled 2024-04-09 – 2024-04-10 (×2): qty 90, 90d supply, fill #0

## 2024-04-09 MED ORDER — ATENOLOL 25 MG PO TABS
25.0000 mg | ORAL_TABLET | Freq: Every day | ORAL | 1 refills | Status: AC
Start: 2024-04-09 — End: ?
  Filled 2024-04-09 – 2024-04-10 (×2): qty 90, 90d supply, fill #0

## 2024-04-09 MED ORDER — ATENOLOL 25 MG PO TABS
25.0000 mg | ORAL_TABLET | Freq: Every day | ORAL | 1 refills | Status: DC
Start: 2024-04-09 — End: 2024-04-09

## 2024-04-09 NOTE — Assessment & Plan Note (Signed)
 Chronic Blood pressure above goal today Patient has been out of his antihypertensives Check metabolic panel: Update metabolic panel stable Restart losartan 50 mg daily.  Patient would like to check blood pressure at home on losartan as monotherapy, if blood pressure is at goal (less than 130/less than 90) would like to continue losartan and discontinue atenolol.  I am agreeable to this. Follow-up in 6 months or sooner as needed, again patient was told to reach out with a MyChart message regarding at home blood pressure readings after starting losartan.

## 2024-04-09 NOTE — Progress Notes (Signed)
 Established Patient Office Visit  Subjective   Patient ID: Evan [name], male    DOB: 11/25/1971  Age: 53 y.o. MRN: [id number]  Chief Complaint  Patient presents with   Hypertension    Hypertension: Patient reports having been out of his antihypertensives for around 6 months.  He reports he has been monitoring his blood pressure at home and that at home readings are generally around 140/90.  Was on atenolol 25 mg daily and losartan 50 mg daily.  Last metabolic panel from September 2024 showed GFR of with but 84 with a potassium level of 3.7.  Insomnia: Patient also reports difficulty with sleeping.  He is requesting Ambien.  Patient sees psychiatrist who prescribes him Xanax and Adderall.  He reports that his psychiatrist cannot prescribe more than 2 controlled substances.  Reports having tried and failed trazodone.      Review of Systems  Eyes:  Negative for blurred vision.  Respiratory:  Negative for shortness of breath.   Cardiovascular:  Negative for chest pain.  Neurological:  Negative for headaches.      Objective:     Pulse 94   Temp 97.9 F (36.6 C) (Temporal)   Ht 6\' 3"  (1.905 m)   Wt 267 lb 2 oz (121.2 kg)   SpO2 99%   BMI 33.39 kg/m  BP Readings from Last 3 Encounters:  11/14/23 (!) 136/91  09/20/23 (!) 164/102  08/28/23 (!) 149/104   Wt Readings from Last 3 Encounters:  04/09/24 267 lb 2 oz (121.2 kg)  09/20/23 265 lb 8 oz (120.4 kg)  08/28/23 261 lb 4.8 oz (118.5 kg)      Physical Exam Vitals reviewed.  Constitutional:      Appearance: Normal appearance.  HENT:     Head: Normocephalic and atraumatic.  Cardiovascular:     Rate and Rhythm: Normal rate and regular rhythm.  Pulmonary:     Effort: Pulmonary effort is normal.     Breath sounds: Normal breath sounds.  Musculoskeletal:     Cervical back: Neck supple.  Skin:    General: Skin is warm and dry.  Neurological:     Mental Status: He is alert and oriented to person, place, and  time.  Psychiatric:        Mood and Affect: Mood normal.        Behavior: Behavior normal.        Thought Content: Thought content normal.        Judgment: Judgment normal.      Results for orders placed or performed in visit on 04/09/24  Basic metabolic panel with GFR  Result Value Ref Range   Sodium 139 135 - 145 mEq/L   Potassium 3.8 3.5 - 5.1 mEq/L   Chloride 107 96 - 112 mEq/L   CO2 24 19 - 32 mEq/L   Glucose, Bld 88 70 - 99 mg/dL   BUN 22 6 - 23 mg/dL   Creatinine, Ser 2.95 0.40 - 1.50 mg/dL   GFR 62.13 >08.65 mL/min   Calcium 8.7 8.4 - 10.5 mg/dL      The 78-IONG ASCVD risk score (Arnett DK, et al., 2019) is: 6.1%    Assessment & Plan:   Problem List Items Addressed This Visit       Cardiovascular and Mediastinum   Essential hypertension - Primary   Chronic Blood pressure above goal today Patient has been out of his antihypertensives Check metabolic panel: Update metabolic panel stable Restart losartan 50 mg daily.  Patient would like to check blood pressure at home on losartan as monotherapy, if blood pressure is at goal (less than 130/less than 90) would like to continue losartan and discontinue atenolol.  I am agreeable to this. Follow-up in 6 months or sooner as needed, again patient was told to reach out with a MyChart message regarding at home blood pressure readings after starting losartan.      Relevant Medications   atenolol (TENORMIN) 25 MG tablet   losartan (COZAAR) 50 MG tablet   Other Relevant Orders   Basic metabolic panel with GFR (Completed)     Other   Insomnia   Chronic I recommended he discuss further with psychiatry.  Also discussed with history of substance abuse I would recommend urine drug screen before prescribing any additional controlled substances.  Patient declined UDS.  Ambien not prescribed today.       Return in about 6 months (around 10/09/2024) for F/U with Ashtan Laton.    Zorita Hiss, NP

## 2024-04-09 NOTE — Patient Instructions (Addendum)
 Check BP at home

## 2024-04-09 NOTE — Assessment & Plan Note (Signed)
 Chronic I recommended he discuss further with psychiatry.  Also discussed with history of substance abuse I would recommend urine drug screen before prescribing any additional controlled substances.  Patient declined UDS.  Ambien not prescribed today.

## 2024-04-10 ENCOUNTER — Other Ambulatory Visit (HOSPITAL_COMMUNITY): Payer: Self-pay

## 2024-04-13 ENCOUNTER — Telehealth: Payer: Self-pay | Admitting: Orthopedic Surgery

## 2024-04-13 ENCOUNTER — Encounter: Payer: Self-pay | Admitting: Orthopedic Surgery

## 2024-04-13 ENCOUNTER — Ambulatory Visit (INDEPENDENT_AMBULATORY_CARE_PROVIDER_SITE_OTHER): Payer: MEDICAID | Admitting: Orthopedic Surgery

## 2024-04-13 DIAGNOSIS — I83012 Varicose veins of right lower extremity with ulcer of calf: Secondary | ICD-10-CM

## 2024-04-13 DIAGNOSIS — I890 Lymphedema, not elsewhere classified: Secondary | ICD-10-CM | POA: Diagnosis not present

## 2024-04-13 DIAGNOSIS — L97211 Non-pressure chronic ulcer of right calf limited to breakdown of skin: Secondary | ICD-10-CM

## 2024-04-13 NOTE — Progress Notes (Signed)
 Office Visit Note   Patient: Evan [name]           Date of Birth: [birth year]           MRN: [id number] Visit Date: 04/13/2024              Requested by: Zorita Hiss, NP [address] [place],  [place] [address] PCP: Zorita Hiss, NP  Chief Complaint  Patient presents with   Left Leg - Follow-up   Right Leg - Follow-up      HPI: Patient is a 53 year old gentleman who is seen in follow-up status post treatment for a venous and lymphatic insufficiency ulcer right lower extremity.  The ulcer had healed.  Patient states that over the weekend he was up on his feet more had increased swelling of both lower extremities and developed breakdown ulceration and weeping edema of the right leg ulcer.  Patient states he has not been on his blood pressure medications for a while he just had these reordered and is going to pick these up today.  Patient also has a history of melanoma.  Assessment & Plan: Visit Diagnoses:  1. Venous stasis ulcer of right calf limited to breakdown of skin with varicose veins (HCC)   2. Lymphedema     Plan: Right leg is rewrapped today with a multilayer compression wrap.  Will evaluate in 1 week.  Will place orders to get patient started for lymphedema pumps  Follow-Up Instructions: Return in about 1 week (around 04/20/2024).   Ortho Exam  Patient is alert, oriented, no adenopathy, well-dressed, normal affect, normal respiratory effort. Examination patient has increased swelling of both lower extremities with the right calf 48 cm in circumference in the left calf 49 cm in circumference.  There is pitting edema and weeping edema in the right leg where the ulcer had healed.  The ulcer is thin and superficial no necrotic changes no clinical signs of melanoma.  Wound measures 6 x 8 cm.  Patient has a good dorsalis pedis pulse no evidence of acute arterial insufficiency.  Calfs are soft nontender no evidence of acute DVT.  Imaging: No results  found.     Labs: Lab Results  Component Value Date   HGBA1C 5.3 09/20/2023   HGBA1C 5.5 10/18/2022   HGBA1C 5.6 04/17/2019   ESRSEDRATE 72 (H) 05/19/2019   ESRSEDRATE 59 (H) 05/05/2019   ESRSEDRATE 75 (H) 04/17/2019   CRP 65.7 (H) 06/23/2019   CRP 61.5 (H) 06/04/2019   CRP 47.8 (H) 05/26/2019   LABURIC 5.6 03/26/2022   REPTSTATUS 04/01/2022 FINAL 03/26/2022   GRAMSTAIN  04/17/2019    RARE WBC PRESENT,BOTH PMN AND MONONUCLEAR NO ORGANISMS SEEN    CULT  03/26/2022    NO GROWTH 5 DAYS Performed at [facility], [address] [place], [place] [address]      Lab Results  Component Value Date   ALBUMIN  4.0 09/20/2023   ALBUMIN  4.4 10/18/2022   ALBUMIN  3.8 10/17/2020    No results found for: "MG" Lab Results  Component Value Date   VD25OH 32.0 04/17/2019    No results found for: "PREALBUMIN"    Latest Ref Rng & Units 09/20/2023    3:42 PM 10/18/2022    1:47 PM 03/28/2022    4:33 AM  CBC EXTENDED  WBC 4.0 - 10.5 K/uL 8.7  9.0  3.7   RBC 4.22 - 5.81 Mil/uL 4.81  4.89  3.87   Hemoglobin 13.0 - 17.0  g/dL 16.1  09.6  04.5   HCT 39.0 - 52.0 % 40.9  41.7  33.7   Platelets 150.0 - 400.0 K/uL 454.0  345.0  261      There is no height or weight on file to calculate BMI.  Orders:  No orders of the defined types were placed in this encounter.  No orders of the defined types were placed in this encounter.    Procedures: No procedures performed  Clinical Data: No additional findings.  ROS:  All other systems negative, except as noted in the HPI. Review of Systems  Objective: Vital Signs: There were no vitals taken for this visit.  Specialty Comments:  No specialty comments available.  PMFS History: Patient Active Problem List   Diagnosis Date Noted   Class 1 obesity with serious comorbidity and body mass index (BMI) of 33.0 to 33.9 in adult 09/20/2023   Panic disorder 10/18/2022   Class 2 obesity with body mass index (BMI) of 36.0 to 36.9 in  adult 10/18/2022   Neuropathy 10/18/2022   Venous stasis ulcer of calf limited to breakdown of skin with varicose veins (HCC)    Cellulitis 03/27/2022   AKI (acute kidney injury) (HCC) 03/26/2022   Multiple open wounds of lower leg 03/26/2022   Dyspnea on exertion 03/26/2022   Great toe pain, right 03/26/2022   IV drug abuse (HCC)    Septic arthritis of knee, right (HCC) 04/17/2019   Hardware complicating wound infection (HCC) 04/17/2019   Hepatitis C virus infection cured after antiviral drug therapy 04/17/2019   Acute lateral meniscus tear of right knee 01/23/2019   Displaced fracture of right tibial tuberosity, initial encounter for closed fracture 01/16/2019   Closed bicondylar fracture of right tibial plateau 01/15/2019   Cough 10/31/2016   Alcohol abuse with intoxication, with delirium (HCC) 10/03/2016   Attention deficit disorder (ADD) in adult 10/03/2016   Depression 06/15/2014   Acute bronchitis 01/28/2014   Chronic pain syndrome 01/28/2014   Hepatitis, chronic persistent (HCC) 05/14/2013   ADD (attention deficit disorder) 05/11/2013   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   Eczema 12/30/2012   Disorder of joints of multiple sites 10/02/2012   Acute and subacute liver necrosis 04/24/2012   Metastatic melanoma 10/17/2011   Chest pain 10/17/2011   Insomnia 12/20/2008   Anxiety state 12/10/2007   ERECTILE DYSFUNCTION 12/10/2007   Essential hypertension 11/21/2007   Past Medical History:  Diagnosis Date   ADD (attention deficit disorder) 05/11/2013   ANXIETY 12/10/2007   BACK PAIN 08/03/2009   Chronic pain syndrome 01/28/2014   Depression 06/15/2014   ERECTILE DYSFUNCTION 12/10/2007   Hepatitis C 05/15/2013     "treated and cured"   HYPERTENSION 11/21/2007   INSOMNIA-SLEEP DISORDER-UNSPEC 12/20/2008   Melanoma (HCC) 2015   Lymph nodes left side , radiation   Neuropathy    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   Preventative health care 05/14/2011     Family History  Problem Relation Age of Onset   Cancer Father        melanoma on back   Cancer Other        pancreatic    Past Surgical History:  Procedure Laterality Date   EXTERNAL FIXATION LEG Right 01/16/2019   Procedure: EXTERNAL FIXATION RIGHT KNEE;  Surgeon: Laneta Pintos, MD;  Location: [facility] OR;  Service: Orthopedics;  Laterality: Right;   HARDWARE REMOVAL Right 04/17/2019   Procedure: HARDWARE REMOVAL RIGHT TIBIA;  Surgeon: Laneta Pintos, MD;  Location[facility]  OR;  Service: Orthopedics;  Laterality: Right;   IRRIGATION AND DEBRIDEMENT KNEE Right 04/17/2019   Procedure: IRRIGATION AND DEBRIDEMENT RIGHT KNEE;  Surgeon: Laneta Pintos, MD;  Location: [facility] OR;  Service: Orthopedics;  Laterality: Right;   LYMPHADENECTOMY Left    ORIF TIBIA PLATEAU Right 01/20/2019   Procedure: OPEN REDUCTION INTERNAL FIXATION (ORIF) TIBIAL PLATEAU;  Surgeon: Laneta Pintos, MD;  Location: [facility] OR;  Service: Orthopedics;  Laterality: Right;   SHOULDER SURGERY Left    chronic L dislocating    Social History   Occupational History   Occupation: works in [employer]  Tobacco Use   Smoking status: Former    Current packs/day: 0.00    Types: Cigarettes    Quit date: 2000    Years since quitting: 25.3   Smokeless tobacco: Never   Tobacco comments:    in college  Vaping Use   Vaping status: Never Used  Substance and Sexual Activity   Alcohol use: Not Currently   Drug use: No   Sexual activity: Not on file

## 2024-04-13 NOTE — Telephone Encounter (Signed)
 Issue was address on pt office visit 03/09/24

## 2024-04-20 ENCOUNTER — Ambulatory Visit: Payer: MEDICAID | Admitting: Orthopedic Surgery

## 2024-04-20 ENCOUNTER — Telehealth: Payer: Self-pay | Admitting: Orthopedic Surgery

## 2024-04-20 NOTE — Telephone Encounter (Signed)
 Patient called and said that you normally schedules him on Mondays to be checked for his wound on the left leg. [phone]

## 2024-04-20 NOTE — Telephone Encounter (Signed)
 I called pt and apologized and advised that I could make an appt for him today at 4pm. He said that he was not feeling well and actually removed the compression dressing himself and that he rewrapped it and would like to come in later in the week. Appt sch for Thursday at 4pm but advised that if anything changes and he needs to come in earlier to call and let me know

## 2024-04-23 ENCOUNTER — Ambulatory Visit: Payer: MEDICAID | Admitting: Orthopedic Surgery

## 2024-04-23 ENCOUNTER — Encounter: Payer: Self-pay | Admitting: Orthopedic Surgery

## 2024-04-23 DIAGNOSIS — I83012 Varicose veins of right lower extremity with ulcer of calf: Secondary | ICD-10-CM | POA: Diagnosis not present

## 2024-04-23 DIAGNOSIS — L97211 Non-pressure chronic ulcer of right calf limited to breakdown of skin: Secondary | ICD-10-CM | POA: Diagnosis not present

## 2024-04-23 NOTE — Progress Notes (Signed)
 Office Visit Note   Patient: Evan [name]           Date of Birth: Apr 21, 1971           MRN: [id number] Visit Date: 04/23/2024              Requested by: Zorita Hiss, NP [address] [place],  [place] [address] PCP: Zorita Hiss, NP  Chief Complaint  Patient presents with   Right Leg - Follow-up      HPI: Patient is a 53 year old gentleman who received Kerecis tissue graft and a multilayer compression wrap for venous lymphatic insufficiency right leg.  Patient states he cut the Profore off he has been using lidocaine  patches.  Assessment & Plan: Visit Diagnoses:  1. Venous stasis ulcer of right calf limited to breakdown of skin with varicose veins (HCC)     Plan: Discussed we will adjust treatment to include washing the leg with Dial soap and water drying the dressing apply 4 x 4 gauze plus an Ace wrap.  Change dressing daily do not moisten dressing to remove.  Follow-Up Instructions: Return in about 2 weeks (around 05/07/2024).   Ortho Exam  Patient is alert, oriented, no adenopathy, well-dressed, normal affect, normal respiratory effort. Examination patient does have decreased venous swelling.  There is 50% fibrinous exudative tissue covering the wound.  The wound is 2.5 x 5 cm and is flat.  There is no surrounding cellulitis no odor or drainage.  Imaging: No results found.    Labs: Lab Results  Component Value Date   HGBA1C 5.3 09/20/2023   HGBA1C 5.5 10/18/2022   HGBA1C 5.6 04/17/2019   ESRSEDRATE 72 (H) 05/19/2019   ESRSEDRATE 59 (H) 05/05/2019   ESRSEDRATE 75 (H) 04/17/2019   CRP 65.7 (H) 06/23/2019   CRP 61.5 (H) 06/04/2019   CRP 47.8 (H) 05/26/2019   LABURIC 5.6 03/26/2022   REPTSTATUS 04/01/2022 FINAL 03/26/2022   GRAMSTAIN  04/17/2019    RARE WBC PRESENT,BOTH PMN AND MONONUCLEAR NO ORGANISMS SEEN    CULT  03/26/2022    NO GROWTH 5 DAYS Performed at [facility], [address], [place], [place] [address]      Lab Results   Component Value Date   ALBUMIN  4.0 09/20/2023   ALBUMIN  4.4 10/18/2022   ALBUMIN  3.8 10/17/2020    No results found for: "MG" Lab Results  Component Value Date   VD25OH 32.0 04/17/2019    No results found for: "PREALBUMIN"    Latest Ref Rng & Units 09/20/2023    3:42 PM 10/18/2022    1:47 PM 03/28/2022    4:33 AM  CBC EXTENDED  WBC 4.0 - 10.5 K/uL 8.7  9.0  3.7   RBC 4.22 - 5.81 Mil/uL 4.81  4.89  3.87   Hemoglobin 13.0 - 17.0 g/dL 82.9  56.2  13.0   HCT 39.0 - 52.0 % 40.9  41.7  33.7   Platelets 150.0 - 400.0 K/uL 454.0  345.0  261      There is no height or weight on file to calculate BMI.  Orders:  No orders of the defined types were placed in this encounter.  No orders of the defined types were placed in this encounter.    Procedures: No procedures performed  Clinical Data: No additional findings.  ROS:  All other systems negative, except as noted in the HPI. Review of Systems  Objective: Vital Signs: There were no vitals taken for this visit.  Specialty Comments:  No specialty comments available.  PMFS History: Patient Active Problem List   Diagnosis Date Noted   Class 1 obesity with serious comorbidity and body mass index (BMI) of 33.0 to 33.9 in adult 09/20/2023   Panic disorder 10/18/2022   Class 2 obesity with body mass index (BMI) of 36.0 to 36.9 in adult 10/18/2022   Neuropathy 10/18/2022   Venous stasis ulcer of calf limited to breakdown of skin with varicose veins (HCC)    Cellulitis 03/27/2022   AKI (acute kidney injury) (HCC) 03/26/2022   Multiple open wounds of lower leg 03/26/2022   Dyspnea on exertion 03/26/2022   Great toe pain, right 03/26/2022   IV drug abuse (HCC)    Septic arthritis of knee, right (HCC) 04/17/2019   Hardware complicating wound infection (HCC) 04/17/2019   Hepatitis C virus infection cured after antiviral drug therapy 04/17/2019   Acute lateral meniscus tear of right knee 01/23/2019   Displaced fracture of  right tibial tuberosity, initial encounter for closed fracture 01/16/2019   Closed bicondylar fracture of right tibial plateau 01/15/2019   Cough 10/31/2016   Alcohol abuse with intoxication, with delirium (HCC) 10/03/2016   Attention deficit disorder (ADD) in adult 10/03/2016   Depression 06/15/2014   Acute bronchitis 01/28/2014   Chronic pain syndrome 01/28/2014   Hepatitis, chronic persistent (HCC) 05/14/2013   ADD (attention deficit disorder) 05/11/2013   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   Eczema 12/30/2012   Disorder of joints of multiple sites 10/02/2012   Acute and subacute liver necrosis 04/24/2012   Metastatic melanoma 10/17/2011   Chest pain 10/17/2011   Insomnia 12/20/2008   Anxiety state 12/10/2007   ERECTILE DYSFUNCTION 12/10/2007   Essential hypertension 11/21/2007   Past Medical History:  Diagnosis Date   ADD (attention deficit disorder) 05/11/2013   ANXIETY 12/10/2007   BACK PAIN 08/03/2009   Chronic pain syndrome 01/28/2014   Depression 06/15/2014   ERECTILE DYSFUNCTION 12/10/2007   Hepatitis C 05/15/2013     "treated and cured"   HYPERTENSION 11/21/2007   INSOMNIA-SLEEP DISORDER-UNSPEC 12/20/2008   Melanoma (HCC) 2015   Lymph nodes left side , radiation   Neuropathy    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   Preventative health care 05/14/2011    Family History  Problem Relation Age of Onset   Cancer Father        melanoma on back   Cancer Other        pancreatic    Past Surgical History:  Procedure Laterality Date   EXTERNAL FIXATION LEG Right 01/16/2019   Procedure: EXTERNAL FIXATION RIGHT KNEE;  Surgeon: Laneta Pintos, MD;  Location: [facility] OR;  Service: Orthopedics;  Laterality: Right;   HARDWARE REMOVAL Right 04/17/2019   Procedure: HARDWARE REMOVAL RIGHT TIBIA;  Surgeon: Laneta Pintos, MD;  Location: [facility] OR;  Service: Orthopedics;  Laterality: Right;   IRRIGATION AND DEBRIDEMENT KNEE Right 04/17/2019   Procedure: IRRIGATION  AND DEBRIDEMENT RIGHT KNEE;  Surgeon: Laneta Pintos, MD;  Location: [facility] OR;  Service: Orthopedics;  Laterality: Right;   LYMPHADENECTOMY Left    ORIF TIBIA PLATEAU Right 01/20/2019   Procedure: OPEN REDUCTION INTERNAL FIXATION (ORIF) TIBIAL PLATEAU;  Surgeon: Laneta Pintos, MD;  Location: [facility] OR;  Service: Orthopedics;  Laterality: Right;   SHOULDER SURGERY Left    chronic L dislocating    Social History   Occupational History   Occupation: works in [employer]  Tobacco Use   Smoking status: Former  Current packs/day: 0.00    Types: Cigarettes    Quit date: 2000    Years since quitting: 25.3   Smokeless tobacco: Never   Tobacco comments:    in college  Vaping Use   Vaping status: Never Used  Substance and Sexual Activity   Alcohol use: Not Currently   Drug use: No   Sexual activity: Not on file

## 2024-05-07 ENCOUNTER — Ambulatory Visit: Payer: MEDICAID | Admitting: Orthopedic Surgery

## 2024-05-08 ENCOUNTER — Telehealth: Payer: Self-pay | Admitting: Orthopedic Surgery

## 2024-05-08 NOTE — Telephone Encounter (Signed)
 Pt called stating Evan [name] told him to call if he is having problems with his leg. Please call pt at [phone].

## 2024-05-11 ENCOUNTER — Telehealth: Payer: Self-pay | Admitting: Orthopedic Surgery

## 2024-05-11 NOTE — Telephone Encounter (Signed)
 Called pt and made appt for Thursday at 4 pm

## 2024-05-11 NOTE — Telephone Encounter (Signed)
 Duplicate message.

## 2024-05-11 NOTE — Telephone Encounter (Signed)
 Pt request a call back from Autumn regarding his appointments

## 2024-05-14 ENCOUNTER — Ambulatory Visit (INDEPENDENT_AMBULATORY_CARE_PROVIDER_SITE_OTHER): Payer: MEDICAID | Admitting: Orthopedic Surgery

## 2024-05-14 DIAGNOSIS — L97211 Non-pressure chronic ulcer of right calf limited to breakdown of skin: Secondary | ICD-10-CM

## 2024-05-14 DIAGNOSIS — I890 Lymphedema, not elsewhere classified: Secondary | ICD-10-CM | POA: Diagnosis not present

## 2024-05-14 DIAGNOSIS — I83012 Varicose veins of right lower extremity with ulcer of calf: Secondary | ICD-10-CM | POA: Diagnosis not present

## 2024-05-18 ENCOUNTER — Encounter: Payer: Self-pay | Admitting: Orthopedic Surgery

## 2024-05-18 NOTE — Progress Notes (Signed)
 Office Visit Note   Patient: Evan [name]           Date of Birth: 1971/04/23           MRN: [id number] Visit Date: 05/14/2024              Requested by: Zorita Hiss, NP [address] [place],  [place] [address] PCP: Zorita Hiss, NP  Chief Complaint  Patient presents with   Right Leg - Follow-up      HPI: Patient is a 53 year old gentleman who is seen in follow-up for right lower extremity venous and lymphatic insufficiency.  Patient has been working on Achilles stretching.  Patient has been using ibuprofen  for pain relief.  Assessment & Plan: Visit Diagnoses:  1. Venous stasis ulcer of right calf limited to breakdown of skin with varicose veins (HCC)   2. Lymphedema     Plan: Kerecis micro graft 19 cm was applied.  Patient will change the 4 x 4 gauze and Ace wrap daily leave the Adaptic in place.  Follow-Up Instructions: Return in about 1 week (around 05/21/2024).   Ortho Exam  Patient is alert, oriented, no adenopathy, well-dressed, normal affect, normal respiratory effort. Examination patient's wound measures 9.5 x 5.5 cm with flat healthy granulation tissue.  The wound healing has stalled, the wound bed has healthy granulation tissue, and patient presents for evaluation and application of Kerecis MariGen Micro graft. After informed consent a 10 blade knife was used to debride the skin and soft tissue to healthy viable bleeding granulation tissue.  Silver nitrate was used for hemostasis. The wound measures: 9.5 cm in length, 5.5cm  in width, 1 mm in depth, wound location right leg Kerecis MariGen micro tissue graft 19 cm2 was applied, and there was no wastage.  Please see the photo below of the Lot number and expiration date. The micro tissue graft was covered with a nonadherent Adaptic dressing, bolstered with 4 x 4 gauze and secured with a compression wrap.     Imaging: No results found.    Labs: Lab Results  Component Value Date   HGBA1C 5.3  09/20/2023   HGBA1C 5.5 10/18/2022   HGBA1C 5.6 04/17/2019   ESRSEDRATE 72 (H) 05/19/2019   ESRSEDRATE 59 (H) 05/05/2019   ESRSEDRATE 75 (H) 04/17/2019   CRP 65.7 (H) 06/23/2019   CRP 61.5 (H) 06/04/2019   CRP 47.8 (H) 05/26/2019   LABURIC 5.6 03/26/2022   REPTSTATUS 04/01/2022 FINAL 03/26/2022   GRAMSTAIN  04/17/2019    RARE WBC PRESENT,BOTH PMN AND MONONUCLEAR NO ORGANISMS SEEN    CULT  03/26/2022    NO GROWTH 5 DAYS Performed at [facility], [address], [place], [place] [address]      Lab Results  Component Value Date   ALBUMIN  4.0 09/20/2023   ALBUMIN  4.4 10/18/2022   ALBUMIN  3.8 10/17/2020    No results found for: "MG" Lab Results  Component Value Date   VD25OH 32.0 04/17/2019    No results found for: "PREALBUMIN"    Latest Ref Rng & Units 09/20/2023    3:42 PM 10/18/2022    1:47 PM 03/28/2022    4:33 AM  CBC EXTENDED  WBC 4.0 - 10.5 K/uL 8.7  9.0  3.7   RBC 4.22 - 5.81 Mil/uL 4.81  4.89  3.87   Hemoglobin 13.0 - 17.0 g/dL 13.0  86.5  78.4   HCT 39.0 - 52.0 % 40.9  41.7  33.7  Platelets 150.0 - 400.0 K/uL 454.0  345.0  261      There is no height or weight on file to calculate BMI.  Orders:  No orders of the defined types were placed in this encounter.  No orders of the defined types were placed in this encounter.    Procedures: No procedures performed  Clinical Data: No additional findings.  ROS:  All other systems negative, except as noted in the HPI. Review of Systems  Objective: Vital Signs: There were no vitals taken for this visit.  Specialty Comments:  No specialty comments available.  PMFS History: Patient Active Problem List   Diagnosis Date Noted   Class 1 obesity with serious comorbidity and body mass index (BMI) of 33.0 to 33.9 in adult 09/20/2023   Panic disorder 10/18/2022   Class 2 obesity with body mass index (BMI) of 36.0 to 36.9 in adult 10/18/2022   Neuropathy 10/18/2022   Venous stasis ulcer of calf  limited to breakdown of skin with varicose veins (HCC)    Cellulitis 03/27/2022   AKI (acute kidney injury) (HCC) 03/26/2022   Multiple open wounds of lower leg 03/26/2022   Dyspnea on exertion 03/26/2022   Great toe pain, right 03/26/2022   IV drug abuse (HCC)    Septic arthritis of knee, right (HCC) 04/17/2019   Hardware complicating wound infection (HCC) 04/17/2019   Hepatitis C virus infection cured after antiviral drug therapy 04/17/2019   Acute lateral meniscus tear of right knee 01/23/2019   Displaced fracture of right tibial tuberosity, initial encounter for closed fracture 01/16/2019   Closed bicondylar fracture of right tibial plateau 01/15/2019   Cough 10/31/2016   Alcohol abuse with intoxication, with delirium (HCC) 10/03/2016   Attention deficit disorder (ADD) in adult 10/03/2016   Depression 06/15/2014   Acute bronchitis 01/28/2014   Chronic pain syndrome 01/28/2014   Hepatitis, chronic persistent (HCC) 05/14/2013   ADD (attention deficit disorder) 05/11/2013   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   Eczema 12/30/2012   Disorder of joints of multiple sites 10/02/2012   Acute and subacute liver necrosis 04/24/2012   Metastatic melanoma 10/17/2011   Chest pain 10/17/2011   Insomnia 12/20/2008   Anxiety state 12/10/2007   ERECTILE DYSFUNCTION 12/10/2007   Essential hypertension 11/21/2007   Past Medical History:  Diagnosis Date   ADD (attention deficit disorder) 05/11/2013   ANXIETY 12/10/2007   BACK PAIN 08/03/2009   Chronic pain syndrome 01/28/2014   Depression 06/15/2014   ERECTILE DYSFUNCTION 12/10/2007   Hepatitis C 05/15/2013     "treated and cured"   HYPERTENSION 11/21/2007   INSOMNIA-SLEEP DISORDER-UNSPEC 12/20/2008   Melanoma (HCC) 2015   Lymph nodes left side , radiation   Neuropathy    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   Preventative health care 05/14/2011    Family History  Problem Relation Age of Onset   Cancer Father         melanoma on back   Cancer Other        pancreatic    Past Surgical History:  Procedure Laterality Date   EXTERNAL FIXATION LEG Right 01/16/2019   Procedure: EXTERNAL FIXATION RIGHT KNEE;  Surgeon: Laneta Pintos, MD;  Location: [facility] OR;  Service: Orthopedics;  Laterality: Right;   HARDWARE REMOVAL Right 04/17/2019   Procedure: HARDWARE REMOVAL RIGHT TIBIA;  Surgeon: Laneta Pintos, MD;  Location: [facility] OR;  Service: Orthopedics;  Laterality: Right;   IRRIGATION AND DEBRIDEMENT KNEE Right 04/17/2019   Procedure: IRRIGATION AND  DEBRIDEMENT RIGHT KNEE;  Surgeon: Laneta Pintos, MD;  Location: [facility] OR;  Service: Orthopedics;  Laterality: Right;   LYMPHADENECTOMY Left    ORIF TIBIA PLATEAU Right 01/20/2019   Procedure: OPEN REDUCTION INTERNAL FIXATION (ORIF) TIBIAL PLATEAU;  Surgeon: Laneta Pintos, MD;  Location: [facility] OR;  Service: Orthopedics;  Laterality: Right;   SHOULDER SURGERY Left    chronic L dislocating    Social History   Occupational History   Occupation: works in [employer]  Tobacco Use   Smoking status: Former    Current packs/day: 0.00    Types: Cigarettes    Quit date: 2000    Years since quitting: 25.4   Smokeless tobacco: Never   Tobacco comments:    in college  Vaping Use   Vaping status: Never Used  Substance and Sexual Activity   Alcohol use: Not Currently   Drug use: No   Sexual activity: Not on file

## 2024-05-21 ENCOUNTER — Ambulatory Visit (INDEPENDENT_AMBULATORY_CARE_PROVIDER_SITE_OTHER): Payer: MEDICAID | Admitting: Orthopedic Surgery

## 2024-05-21 DIAGNOSIS — I87331 Chronic venous hypertension (idiopathic) with ulcer and inflammation of right lower extremity: Secondary | ICD-10-CM

## 2024-05-21 DIAGNOSIS — I83012 Varicose veins of right lower extremity with ulcer of calf: Secondary | ICD-10-CM | POA: Diagnosis not present

## 2024-05-21 DIAGNOSIS — I890 Lymphedema, not elsewhere classified: Secondary | ICD-10-CM | POA: Diagnosis not present

## 2024-05-21 DIAGNOSIS — L97211 Non-pressure chronic ulcer of right calf limited to breakdown of skin: Secondary | ICD-10-CM

## 2024-05-22 ENCOUNTER — Encounter: Payer: Self-pay | Admitting: Orthopedic Surgery

## 2024-05-22 NOTE — Progress Notes (Signed)
 Office Visit Note   Patient: Evan [name]           Date of Birth: [birth year]           MRN: [id number] Visit Date: 05/21/2024              Requested by: Zorita Hiss, NP [address] [place],  [place] [address] PCP: Zorita Hiss, NP  Chief Complaint  Patient presents with   Right Leg - Wound Check      HPI: Patient is a 53 year old gentleman is seen in follow-up for venous and lymphatic insufficiency with ulceration to the right lower extremity.  Patient has a history of lymphedema involving the upper and lower extremities.  Patient is status post lymph node resection in the upper extremity for cancer.  Patient has used both compression garments and massage therapy without relief.  Assessment & Plan: Visit Diagnoses:  1. Venous stasis ulcer of right calf limited to breakdown of skin with varicose veins (HCC)   2. Lymphedema   3. Chronic venous hypertension (idiopathic) with ulcer and inflammation of right lower extremity (HCC)     Plan: Will plan for setting patient up for lymphedema garments for both the upper and lower extremities.  Continue with Vashe and dressing changes to the right leg.  Follow-Up Instructions: No follow-ups on file.   Ortho Exam  Patient is alert, oriented, no adenopathy, well-dressed, normal affect, normal respiratory effort. Examination patient's wound has healthy granulation tissue.  There is no cellulitis no signs of infection.  Ulcer measures 5 x 9 cm and is flat. Narcotics: .CHLMME  Imaging: No results found.   Labs: Lab Results  Component Value Date   HGBA1C 5.3 09/20/2023   HGBA1C 5.5 10/18/2022   HGBA1C 5.6 04/17/2019   ESRSEDRATE 72 (H) 05/19/2019   ESRSEDRATE 59 (H) 05/05/2019   ESRSEDRATE 75 (H) 04/17/2019   CRP 65.7 (H) 06/23/2019   CRP 61.5 (H) 06/04/2019   CRP 47.8 (H) 05/26/2019   LABURIC 5.6 03/26/2022   REPTSTATUS 04/01/2022 FINAL 03/26/2022   GRAMSTAIN  04/17/2019    RARE WBC PRESENT,BOTH PMN AND  MONONUCLEAR NO ORGANISMS SEEN    CULT  03/26/2022    NO GROWTH 5 DAYS Performed at [facility], [address], [place], [place] [address]      Lab Results  Component Value Date   ALBUMIN  4.0 09/20/2023   ALBUMIN  4.4 10/18/2022   ALBUMIN  3.8 10/17/2020    No results found for: "MG" Lab Results  Component Value Date   VD25OH 32.0 04/17/2019    No results found for: "PREALBUMIN"    Latest Ref Rng & Units 09/20/2023    3:42 PM 10/18/2022    1:47 PM 03/28/2022    4:33 AM  CBC EXTENDED  WBC 4.0 - 10.5 K/uL 8.7  9.0  3.7   RBC 4.22 - 5.81 Mil/uL 4.81  4.89  3.87   Hemoglobin 13.0 - 17.0 g/dL 82.9  56.2  13.0   HCT 39.0 - 52.0 % 40.9  41.7  33.7   Platelets 150.0 - 400.0 K/uL 454.0  345.0  261      There is no height or weight on file to calculate BMI.  Orders:  No orders of the defined types were placed in this encounter.  No orders of the defined types were placed in this encounter.    Procedures: No procedures performed  Clinical Data: No additional findings.  ROS:  All other systems  negative, except as noted in the HPI. Review of Systems  Objective: Vital Signs: There were no vitals taken for this visit.  Specialty Comments:  No specialty comments available.  PMFS History: Patient Active Problem List   Diagnosis Date Noted   Class 1 obesity with serious comorbidity and body mass index (BMI) of 33.0 to 33.9 in adult 09/20/2023   Panic disorder 10/18/2022   Class 2 obesity with body mass index (BMI) of 36.0 to 36.9 in adult 10/18/2022   Neuropathy 10/18/2022   Venous stasis ulcer of calf limited to breakdown of skin with varicose veins (HCC)    Cellulitis 03/27/2022   AKI (acute kidney injury) (HCC) 03/26/2022   Multiple open wounds of lower leg 03/26/2022   Dyspnea on exertion 03/26/2022   Great toe pain, right 03/26/2022   IV drug abuse (HCC)    Septic arthritis of knee, right (HCC) 04/17/2019   Hardware complicating wound infection (HCC)  04/17/2019   Hepatitis C virus infection cured after antiviral drug therapy 04/17/2019   Acute lateral meniscus tear of right knee 01/23/2019   Displaced fracture of right tibial tuberosity, initial encounter for closed fracture 01/16/2019   Closed bicondylar fracture of right tibial plateau 01/15/2019   Cough 10/31/2016   Alcohol abuse with intoxication, with delirium (HCC) 10/03/2016   Attention deficit disorder (ADD) in adult 10/03/2016   Depression 06/15/2014   Acute bronchitis 01/28/2014   Chronic pain syndrome 01/28/2014   Hepatitis, chronic persistent (HCC) 05/14/2013   ADD (attention deficit disorder) 05/11/2013   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   Eczema 12/30/2012   Disorder of joints of multiple sites 10/02/2012   Acute and subacute liver necrosis 04/24/2012   Metastatic melanoma 10/17/2011   Chest pain 10/17/2011   Insomnia 12/20/2008   Anxiety state 12/10/2007   ERECTILE DYSFUNCTION 12/10/2007   Essential hypertension 11/21/2007   Past Medical History:  Diagnosis Date   ADD (attention deficit disorder) 05/11/2013   ANXIETY 12/10/2007   BACK PAIN 08/03/2009   Chronic pain syndrome 01/28/2014   Depression 06/15/2014   ERECTILE DYSFUNCTION 12/10/2007   Hepatitis C 05/15/2013     "treated and cured"   HYPERTENSION 11/21/2007   INSOMNIA-SLEEP DISORDER-UNSPEC 12/20/2008   Melanoma (HCC) 2015   Lymph nodes left side , radiation   Neuropathy    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   Preventative health care 05/14/2011    Family History  Problem Relation Age of Onset   Cancer Father        melanoma on back   Cancer Other        pancreatic    Past Surgical History:  Procedure Laterality Date   EXTERNAL FIXATION LEG Right 01/16/2019   Procedure: EXTERNAL FIXATION RIGHT KNEE;  Surgeon: Laneta Pintos, MD;  [facility]: [facility];  Service: Orthopedics;  Laterality: Right;   HARDWARE REMOVAL Right 04/17/2019   Procedure: HARDWARE REMOVAL RIGHT TIBIA;   Surgeon: Laneta Pintos, MD;  Location: [facility];  Service: Orthopedics;  Laterality: Right;   IRRIGATION AND DEBRIDEMENT KNEE Right 04/17/2019   Procedure: IRRIGATION AND DEBRIDEMENT RIGHT KNEE;  Surgeon: Laneta Pintos, MD;  Location: [facility];  Service: Orthopedics;  Laterality: Right;   LYMPHADENECTOMY Left    ORIF TIBIA PLATEAU Right 01/20/2019   Procedure: OPEN REDUCTION INTERNAL FIXATION (ORIF) TIBIAL PLATEAU;  Surgeon: Laneta Pintos, MD;  Location: [facility];  Service: Orthopedics;  Laterality: Right;   SHOULDER SURGERY Left    chronic L dislocating  Social History   Occupational History   Occupation: works in [employer]  Tobacco Use   Smoking status: Former    Current packs/day: 0.00    Types: Cigarettes    Quit date: 2000    Years since quitting: 25.4   Smokeless tobacco: Never   Tobacco comments:    in college  Vaping Use   Vaping status: Never Used  Substance and Sexual Activity   Alcohol use: Not Currently   Drug use: No   Sexual activity: Not on file

## 2024-06-04 ENCOUNTER — Ambulatory Visit (INDEPENDENT_AMBULATORY_CARE_PROVIDER_SITE_OTHER): Payer: MEDICAID

## 2024-06-04 DIAGNOSIS — I890 Lymphedema, not elsewhere classified: Secondary | ICD-10-CM

## 2024-06-04 DIAGNOSIS — L97211 Non-pressure chronic ulcer of right calf limited to breakdown of skin: Secondary | ICD-10-CM

## 2024-06-04 DIAGNOSIS — I83012 Varicose veins of right lower extremity with ulcer of calf: Secondary | ICD-10-CM

## 2024-06-04 DIAGNOSIS — I87331 Chronic venous hypertension (idiopathic) with ulcer and inflammation of right lower extremity: Secondary | ICD-10-CM

## 2024-06-04 NOTE — Progress Notes (Signed)
 Office Visit Note   Patient: Evan [name]           Date of Birth: Jul 16, 1971           MRN: [id number] Visit Date: 06/04/2024              Requested by: Zorita Hiss, NP [address] [place],  [place] [address] PCP: Zorita Hiss, NP  Chief Complaint  Patient presents with   Right Leg - Wound Check      HPI: Patient is a 53 year old gentleman is seen in follow-up for venous and lymphatic insufficiency with ulceration to the right lower extremity. Patient has a history of lymphedema involving the upper and lower extremities. Patient is status post lymph node resection in the upper extremity for cancer. Patient has used both compression garments and massage therapy without relief.   Assessment & Plan: Visit Diagnoses:  1. Venous stasis ulcer of right calf limited to breakdown of skin with varicose veins (HCC)   2. Lymphedema   3. Chronic venous hypertension (idiopathic) with ulcer and inflammation of right lower extremity (HCC)     Plan: Pending delivery of lymphedema garments for both the upper and lower extremities.   Continue with Vashe and dressing changes to the right leg.  Follow-Up Instructions: Return in about 1 week (around 06/11/2024).   Ortho Exam  Patient is alert, oriented, no adenopathy, well-dressed, normal affect, normal respiratory effort. Palpable pedal pulses.  Left calf wound 5.5 x 3 with 100 % granulation tissue.  No signs of infection or cellulitis.      Imaging: No results found. No images are attached to the encounter.  Labs: Lab Results  Component Value Date   HGBA1C 5.3 09/20/2023   HGBA1C 5.5 10/18/2022   HGBA1C 5.6 04/17/2019   ESRSEDRATE 72 (H) 05/19/2019   ESRSEDRATE 59 (H) 05/05/2019   ESRSEDRATE 75 (H) 04/17/2019   CRP 65.7 (H) 06/23/2019   CRP 61.5 (H) 06/04/2019   CRP 47.8 (H) 05/26/2019   LABURIC 5.6 03/26/2022   REPTSTATUS 04/01/2022 FINAL 03/26/2022   GRAMSTAIN  04/17/2019    RARE WBC PRESENT,BOTH PMN AND  MONONUCLEAR NO ORGANISMS SEEN    CULT  03/26/2022    NO GROWTH 5 DAYS Performed at [facility], [address], [place], [place] [address]      Lab Results  Component Value Date   ALBUMIN  4.0 09/20/2023   ALBUMIN  4.4 10/18/2022   ALBUMIN  3.8 10/17/2020    No results found for: MG Lab Results  Component Value Date   VD25OH 32.0 04/17/2019    No results found for: PREALBUMIN    Latest Ref Rng & Units 09/20/2023    3:42 PM 10/18/2022    1:47 PM 03/28/2022    4:33 AM  CBC EXTENDED  WBC 4.0 - 10.5 K/uL 8.7  9.0  3.7   RBC 4.22 - 5.81 Mil/uL 4.81  4.89  3.87   Hemoglobin 13.0 - 17.0 g/dL 73.2  20.2  54.2   HCT 39.0 - 52.0 % 40.9  41.7  33.7   Platelets 150.0 - 400.0 K/uL 454.0  345.0  261      There is no height or weight on file to calculate BMI.  Orders:  No orders of the defined types were placed in this encounter.  No orders of the defined types were placed in this encounter.    Procedures: No procedures performed  Clinical Data: No additional findings.  ROS:  All  other systems negative, except as noted in the HPI. Review of Systems  Objective: Vital Signs: There were no vitals taken for this visit.  Specialty Comments:  No specialty comments available.  PMFS History: Patient Active Problem List   Diagnosis Date Noted   Class 1 obesity with serious comorbidity and body mass index (BMI) of 33.0 to 33.9 in adult 09/20/2023   Panic disorder 10/18/2022   Class 2 obesity with body mass index (BMI) of 36.0 to 36.9 in adult 10/18/2022   Neuropathy 10/18/2022   Venous stasis ulcer of calf limited to breakdown of skin with varicose veins (HCC)    Cellulitis 03/27/2022   AKI (acute kidney injury) (HCC) 03/26/2022   Multiple open wounds of lower leg 03/26/2022   Dyspnea on exertion 03/26/2022   Great toe pain, right 03/26/2022   IV drug abuse (HCC)    Septic arthritis of knee, right (HCC) 04/17/2019   Hardware complicating wound infection (HCC)  04/17/2019   Hepatitis C virus infection cured after antiviral drug therapy 04/17/2019   Acute lateral meniscus tear of right knee 01/23/2019   Displaced fracture of right tibial tuberosity, initial encounter for closed fracture 01/16/2019   Closed bicondylar fracture of right tibial plateau 01/15/2019   Cough 10/31/2016   Alcohol abuse with intoxication, with delirium (HCC) 10/03/2016   Attention deficit disorder (ADD) in adult 10/03/2016   Depression 06/15/2014   Acute bronchitis 01/28/2014   Chronic pain syndrome 01/28/2014   Hepatitis, chronic persistent (HCC) 05/14/2013   ADD (attention deficit disorder) 05/11/2013   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   Eczema 12/30/2012   Disorder of joints of multiple sites 10/02/2012   Acute and subacute liver necrosis 04/24/2012   Metastatic melanoma 10/17/2011   Chest pain 10/17/2011   Insomnia 12/20/2008   Anxiety state 12/10/2007   ERECTILE DYSFUNCTION 12/10/2007   Essential hypertension 11/21/2007   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   Preventative health care 05/14/2011    Family History  Problem Relation Age of Onset   Cancer Father        melanoma on back   Cancer Other        pancreatic    Past Surgical History:  Procedure Laterality Date   EXTERNAL FIXATION LEG Right 01/16/2019   Procedure: EXTERNAL FIXATION RIGHT KNEE;  Surgeon: Laneta Pintos, MD;  [facility]: [facility];  Service: Orthopedics;  Laterality: Right;   HARDWARE REMOVAL Right 04/17/2019   Procedure: HARDWARE REMOVAL RIGHT TIBIA;   Surgeon: Laneta Pintos, MD;  [facility]: [facility];  Service: Orthopedics;  Laterality: Right;   IRRIGATION AND DEBRIDEMENT KNEE Right 04/17/2019   Procedure: IRRIGATION AND DEBRIDEMENT RIGHT KNEE;  Surgeon: Laneta Pintos, MD;  [facility]: [facility];  Service: Orthopedics;  Laterality: Right;   LYMPHADENECTOMY Left    ORIF TIBIA PLATEAU Right 01/20/2019   Procedure: OPEN REDUCTION INTERNAL FIXATION (ORIF) TIBIAL PLATEAU;  Surgeon: Laneta Pintos, MD;  Location: [facility];  Service: Orthopedics;  Laterality: Right;   SHOULDER SURGERY Left    chronic L dislocating  Social History   Occupational History   Occupation: works in [employer]  Tobacco Use   Smoking status: Former    Current packs/day: 0.00    Types: Cigarettes    Quit date: 2000    Years since quitting: 25.4   Smokeless tobacco: Never   Tobacco comments:    in college  Vaping Use   Vaping status: Never Used  Substance and Sexual Activity   Alcohol use: Not Currently   Drug use: No   Sexual activity: Not on file

## 2024-06-10 ENCOUNTER — Telehealth: Payer: Self-pay | Admitting: Orthopedic Surgery

## 2024-06-10 NOTE — Telephone Encounter (Signed)
 Patient called and said he is returning a call. ZO#[phone]

## 2024-06-11 ENCOUNTER — Telehealth: Payer: Self-pay

## 2024-06-11 ENCOUNTER — Ambulatory Visit: Payer: MEDICAID | Admitting: Orthopedic Surgery

## 2024-06-11 NOTE — Telephone Encounter (Signed)
 I called pt and he wanted to confirm his appt today

## 2024-06-11 NOTE — Telephone Encounter (Signed)
 I called pt and he wanted to confirm his appt for today.

## 2024-06-11 NOTE — Telephone Encounter (Signed)
 Patient called and states she would like a CB from Evan [name]. I asked him to give me more details and wouldn't give me more details besides I need a CB from Evan [name] and its important regarding Wound.   CB: [phone]

## 2024-06-16 ENCOUNTER — Telehealth: Payer: Self-pay | Admitting: Orthopedic Surgery

## 2024-06-16 NOTE — Telephone Encounter (Signed)
 Can you please call pt and make an appt with either Erin or Dr. Harden please next week?

## 2024-06-16 NOTE — Telephone Encounter (Signed)
 Patient called and wanted you to know he uses [employer] [employer] for his ride and they never came to pick him up for his 6/19 appointment. CB#[phone]

## 2024-06-18 ENCOUNTER — Ambulatory Visit (INDEPENDENT_AMBULATORY_CARE_PROVIDER_SITE_OTHER): Payer: MEDICAID | Admitting: Orthopedic Surgery

## 2024-06-18 DIAGNOSIS — I83012 Varicose veins of right lower extremity with ulcer of calf: Secondary | ICD-10-CM | POA: Diagnosis not present

## 2024-06-18 DIAGNOSIS — L97211 Non-pressure chronic ulcer of right calf limited to breakdown of skin: Secondary | ICD-10-CM

## 2024-06-22 ENCOUNTER — Encounter: Payer: Self-pay | Admitting: Orthopedic Surgery

## 2024-06-22 NOTE — Progress Notes (Signed)
 Office Visit Note   Patient: Evan [name]           Date of Birth: June 30, 1971           MRN: [id number] Visit Date: 06/18/2024              Requested by: Elnor Lauraine BRAVO, NP [address] [place],  [place] [address] PCP: Elnor Lauraine BRAVO, NP  Chief Complaint  Patient presents with   Right Leg - Wound Check      HPI: Patient is a 53 year old gentleman who is seen in follow-up for venous ulceration right lower extremity.  Patient has increased swelling and dermatitis.  Assessment & Plan: Visit Diagnoses:  1. Venous stasis ulcer of right calf limited to breakdown of skin with varicose veins (HCC)     Plan: Kerecis micro graft 38 cm was applied.  Patient is currently under evaluation for lymphedema pumps.  Follow-Up Instructions: Return in about 1 week (around 06/25/2024).   Ortho Exam  Patient is alert, oriented, no adenopathy, well-dressed, normal affect, normal respiratory effort. Examination the wound bed measures 7 x 9 cm there is macerated skin with satellite lesions with increased swelling.  The wound healing has stalled, the wound bed has healthy granulation tissue, and patient presents for evaluation and application of Kerecis MariGen Micro graft. After informed consent a 10 blade knife was used to debride the skin and soft tissue to healthy viable bleeding granulation tissue.  Silver nitrate was used for hemostasis. The wound measures: 9 cm in length, 7cm  in width, 2 mm in depth, wound location anterior right calf Kerecis MariGen micro tissue graft 38 cm2 was applied, and there was no wastage.  Please see the photo below of the Lot number and expiration date. The micro tissue graft was covered with a nonadherent Adaptic dressing, bolstered with 4 x 4 gauze and secured with a compression wrap.       Imaging: No results found.   Labs: Lab Results  Component Value Date   HGBA1C 5.3 09/20/2023   HGBA1C 5.5 10/18/2022   HGBA1C 5.6 04/17/2019   ESRSEDRATE  72 (H) 05/19/2019   ESRSEDRATE 59 (H) 05/05/2019   ESRSEDRATE 75 (H) 04/17/2019   CRP 65.7 (H) 06/23/2019   CRP 61.5 (H) 06/04/2019   CRP 47.8 (H) 05/26/2019   LABURIC 5.6 03/26/2022   REPTSTATUS 04/01/2022 FINAL 03/26/2022   GRAMSTAIN  04/17/2019    RARE WBC PRESENT,BOTH PMN AND MONONUCLEAR NO ORGANISMS SEEN    CULT  03/26/2022    NO GROWTH 5 DAYS Performed at [facility], [address] [place], [place] [address]      Lab Results  Component Value Date   ALBUMIN  4.0 09/20/2023   ALBUMIN  4.4 10/18/2022   ALBUMIN  3.8 10/17/2020    No results found for: MG Lab Results  Component Value Date   VD25OH 32.0 04/17/2019    No results found for: PREALBUMIN    Latest Ref Rng & Units 09/20/2023    3:42 PM 10/18/2022    1:47 PM 03/28/2022    4:33 AM  CBC EXTENDED  WBC 4.0 - 10.5 K/uL 8.7  9.0  3.7   RBC 4.22 - 5.81 Mil/uL 4.81  4.89  3.87   Hemoglobin 13.0 - 17.0 g/dL 87.0  86.3  89.0   HCT 39.0 - 52.0 % 40.9  41.7  33.7   Platelets 150.0 - 400.0 K/uL 454.0  345.0  261      There  is no height or weight on file to calculate BMI.  Orders:  No orders of the defined types were placed in this encounter.  No orders of the defined types were placed in this encounter.    Procedures: No procedures performed  Clinical Data: No additional findings.  ROS:  All other systems negative, except as noted in the HPI. Review of Systems  Objective: Vital Signs: There were no vitals taken for this visit.  Specialty Comments:  No specialty comments available.  PMFS History: Patient Active Problem List   Diagnosis Date Noted   Class 1 obesity with serious comorbidity and body mass index (BMI) of 33.0 to 33.9 in adult 09/20/2023   Panic disorder 10/18/2022   Class 2 obesity with body mass index (BMI) of 36.0 to 36.9 in adult 10/18/2022   Neuropathy 10/18/2022   Venous stasis ulcer of calf limited to breakdown of skin with varicose veins (HCC)    Cellulitis  03/27/2022   AKI (acute kidney injury) (HCC) 03/26/2022   Multiple open wounds of lower leg 03/26/2022   Dyspnea on exertion 03/26/2022   Great toe pain, right 03/26/2022   IV drug abuse (HCC)    Septic arthritis of knee, right (HCC) 04/17/2019   Hardware complicating wound infection (HCC) 04/17/2019   Hepatitis C virus infection cured after antiviral drug therapy 04/17/2019   Acute lateral meniscus tear of right knee 01/23/2019   Displaced fracture of right tibial tuberosity, initial encounter for closed fracture 01/16/2019   Closed bicondylar fracture of right tibial plateau 01/15/2019   Cough 10/31/2016   Alcohol abuse with intoxication, with delirium (HCC) 10/03/2016   Attention deficit disorder (ADD) in adult 10/03/2016   Depression 06/15/2014   Acute bronchitis 01/28/2014   Chronic pain syndrome 01/28/2014   Hepatitis, chronic persistent (HCC) 05/14/2013   ADD (attention deficit disorder) 05/11/2013   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   Eczema 12/30/2012   Disorder of joints of multiple sites 10/02/2012   Acute and subacute liver necrosis 04/24/2012   Metastatic melanoma 10/17/2011   Chest pain 10/17/2011   Insomnia 12/20/2008   Anxiety state 12/10/2007   ERECTILE DYSFUNCTION 12/10/2007   Essential hypertension 11/21/2007   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   Preventative health care 05/14/2011    Family History  Problem Relation Age of Onset   Cancer Father        melanoma on back   Cancer Other        pancreatic    Past  Surgical History:  Procedure Laterality Date   EXTERNAL FIXATION LEG Right 01/16/2019   Procedure: EXTERNAL FIXATION RIGHT KNEE;  Surgeon: Kendal Franky SQUIBB, MD;  Location: [facility];  Service: Orthopedics;  Laterality: Right;   HARDWARE REMOVAL Right 04/17/2019   Procedure: HARDWARE REMOVAL RIGHT TIBIA;  Surgeon: Kendal Franky SQUIBB, MD;  Location: [facility];  Service: Orthopedics;  Laterality: Right;   IRRIGATION AND DEBRIDEMENT KNEE Right 04/17/2019   Procedure: IRRIGATION AND DEBRIDEMENT RIGHT KNEE;  Surgeon: Kendal Franky SQUIBB, MD;  Location: [facility];  Service: Orthopedics;  Laterality: Right;   LYMPHADENECTOMY Left    ORIF TIBIA PLATEAU Right 01/20/2019   Procedure: OPEN REDUCTION INTERNAL FIXATION (ORIF) TIBIAL PLATEAU;  Surgeon: Kendal Franky SQUIBB, MD;  Location: [facility];  Service: Orthopedics;  Laterality: Right;   SHOULDER SURGERY Left    chronic L dislocating    Social History   Occupational History   Occupation: works in [employer]  Tobacco Use   Smoking status: Former    Current packs/day: 0.00    Types: Cigarettes    Quit date: 2000    Years since quitting: 25.5   Smokeless tobacco: Never   Tobacco comments:    in college  Vaping Use   Vaping status: Never Used  Substance and Sexual Activity   Alcohol use: Not Currently   Drug use: No   Sexual activity: Not on file

## 2024-06-25 ENCOUNTER — Ambulatory Visit (INDEPENDENT_AMBULATORY_CARE_PROVIDER_SITE_OTHER): Payer: MEDICAID | Admitting: Orthopedic Surgery

## 2024-06-25 DIAGNOSIS — I83012 Varicose veins of right lower extremity with ulcer of calf: Secondary | ICD-10-CM

## 2024-06-25 DIAGNOSIS — L97211 Non-pressure chronic ulcer of right calf limited to breakdown of skin: Secondary | ICD-10-CM | POA: Diagnosis not present

## 2024-06-29 ENCOUNTER — Encounter: Payer: Self-pay | Admitting: Orthopedic Surgery

## 2024-06-29 NOTE — Progress Notes (Signed)
 Morning  Office Visit Note   Patient: Evan [name]           Date of Birth: 1971/10/13           MRN: [id number] Visit Date: 06/25/2024              Requested by: Elnor Lauraine BRAVO, NP [address] 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  38 cm of Kerecis micro graft was applied at the last visit.  Patient states he is just received his lymphedema pumps.  Assessment & Plan: Visit Diagnoses:  1. Venous stasis ulcer of right calf limited to breakdown of skin with varicose veins (HCC)     Plan: Kerecis micro graft 16 cm was applied to the wound bed.  Continue with compression.  Follow-Up Instructions: Return in about 1 week (around 07/02/2024).   Ortho Exam  Patient is alert, oriented, no adenopathy, well-dressed, normal affect, normal respiratory effort. Examination the ulcer shows significant improvement.  The wound currently measures 3 x 5 cm with flat healthy granulation tissue.  Wound was debrided Kerecis micro graft 16 cm applied.  There is good superficial epithelialization around the wound.  The wound healing has stalled, the wound bed has healthy granulation tissue, and patient presents for evaluation and application of Kerecis MariGen Micro graft. After informed consent a 10 blade knife was used to debride the skin and soft tissue to healthy viable bleeding granulation tissue.  Silver nitrate was used for hemostasis. The wound measures: 5 cm in length, 3cm  in width, 1 mm in depth, wound location right calf Kerecis MariGen micro tissue graft 16 cm2 was applied, and there was no wastage.  Please see the photo below of the Lot number and expiration date. The micro tissue graft was covered with a nonadherent Adaptic dressing, bolstered with 4 x 4  gauze and secured with a compression wrap.       Imaging: No results found.     Labs: Lab Results  Component Value Date   HGBA1C 5.3 09/20/2023   HGBA1C 5.5 10/18/2022   HGBA1C 5.6 04/17/2019   ESRSEDRATE 72 (H) 05/19/2019   ESRSEDRATE 59 (H) 05/05/2019   ESRSEDRATE 75 (H) 04/17/2019   CRP 65.7 (H) 06/23/2019   CRP 61.5 (H) 06/04/2019   CRP 47.8 (H) 05/26/2019   LABURIC 5.6 03/26/2022   REPTSTATUS 04/01/2022 FINAL 03/26/2022   GRAMSTAIN  04/17/2019    RARE WBC PRESENT,BOTH PMN AND MONONUCLEAR NO ORGANISMS SEEN    CULT  03/26/2022    NO GROWTH 5 DAYS Performed at [facility], [address], [place], [place] [address]      Lab Results  Component Value Date   ALBUMIN  4.0 09/20/2023   ALBUMIN  4.4 10/18/2022   ALBUMIN  3.8 10/17/2020    No results found for: MG Lab Results  Component Value Date   VD25OH 32.0 04/17/2019    No results found for: PREALBUMIN    Latest Ref Rng & Units 09/20/2023    3:42 PM 10/18/2022    1:47 PM 03/28/2022    4:33 AM  CBC EXTENDED  WBC 4.0 - 10.5 K/uL 8.7  9.0  3.7   RBC 4.22 - 5.81 Mil/uL 4.81  4.89  3.87  Hemoglobin 13.0 - 17.0 g/dL 87.0  86.3  89.0   HCT 39.0 - 52.0 % 40.9  41.7  33.7   Platelets 150.0 - 400.0 K/uL 454.0  345.0  261      There is no height or weight on file to calculate BMI.  Orders:  No orders of the defined types were placed in this encounter.  No orders of the defined types were placed in this encounter.    Procedures: No procedures performed  Clinical Data: No additional findings.  ROS:  All other systems negative, except as noted in the HPI. Review of Systems  Objective: Vital Signs: There were no vitals taken for this visit.  Specialty Comments:  No specialty comments available.  PMFS History: Patient Active Problem List   Diagnosis Date Noted   Class 1 obesity with serious comorbidity and body mass index (BMI) of 33.0 to 33.9 in adult 09/20/2023   Panic disorder  10/18/2022   Class 2 obesity with body mass index (BMI) of 36.0 to 36.9 in adult 10/18/2022   Neuropathy 10/18/2022   Venous stasis ulcer of calf limited to breakdown of skin with varicose veins (HCC)    Cellulitis 03/27/2022   AKI (acute kidney injury) (HCC) 03/26/2022   Multiple open wounds of lower leg 03/26/2022   Dyspnea on exertion 03/26/2022   Great toe pain, right 03/26/2022   IV drug abuse (HCC)    Septic arthritis of knee, right (HCC) 04/17/2019   Hardware complicating wound infection (HCC) 04/17/2019   Hepatitis C virus infection cured after antiviral drug therapy 04/17/2019   Acute lateral meniscus tear of right knee 01/23/2019   Displaced fracture of right tibial tuberosity, initial encounter for closed fracture 01/16/2019   Closed bicondylar fracture of right tibial plateau 01/15/2019   Cough 10/31/2016   Alcohol abuse with intoxication, with delirium (HCC) 10/03/2016   Attention deficit disorder (ADD) in adult 10/03/2016   Depression 06/15/2014   Acute bronchitis 01/28/2014   Chronic pain syndrome 01/28/2014   Hepatitis, chronic persistent (HCC) 05/14/2013   ADD (attention deficit disorder) 05/11/2013   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   Eczema 12/30/2012   Disorder of joints of multiple sites 10/02/2012   Acute and subacute liver necrosis 04/24/2012   Metastatic melanoma 10/17/2011   Chest pain 10/17/2011   Insomnia 12/20/2008   Anxiety state 12/10/2007   ERECTILE DYSFUNCTION 12/10/2007   Essential hypertension 11/21/2007   Past Medical History:  Diagnosis Date   ADD (attention deficit disorder) 05/11/2013   ANXIETY 12/10/2007   BACK PAIN 08/03/2009   Chronic pain syndrome 01/28/2014   Depression 06/15/2014   ERECTILE DYSFUNCTION 12/10/2007   Hepatitis C 05/15/2013     treated and cured   HYPERTENSION 11/21/2007   INSOMNIA-SLEEP DISORDER-UNSPEC 12/20/2008   Melanoma (HCC) 2015   Lymph nodes left side , radiation   Neuropathy     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   Preventative health care 05/14/2011    Family History  Problem Relation Age of Onset   Cancer Father        melanoma on back   Cancer Other        pancreatic    Past Surgical History:  Procedure Laterality Date   EXTERNAL FIXATION LEG Right 01/16/2019   Procedure: EXTERNAL FIXATION RIGHT KNEE;  Surgeon: Kendal Franky SQUIBB, MD;  Location: [facility] OR;  Service: Orthopedics;  Laterality: Right;   HARDWARE REMOVAL Right 04/17/2019   Procedure: HARDWARE REMOVAL RIGHT TIBIA;  Surgeon: Kendal Franky SQUIBB,  MD;  Location: [facility];  Service: Orthopedics;  Laterality: Right;   IRRIGATION AND DEBRIDEMENT KNEE Right 04/17/2019   Procedure: IRRIGATION AND DEBRIDEMENT RIGHT KNEE;  Surgeon: Kendal Franky SQUIBB, MD;  Location: [facility];  Service: Orthopedics;  Laterality: Right;   LYMPHADENECTOMY Left    ORIF TIBIA PLATEAU Right 01/20/2019   Procedure: OPEN REDUCTION INTERNAL FIXATION (ORIF) TIBIAL PLATEAU;  Surgeon: Kendal Franky SQUIBB, MD;  Location: [facility];  Service: Orthopedics;  Laterality: Right;   SHOULDER SURGERY Left    chronic L dislocating    Social History   Occupational History   Occupation: works in [employer]  Tobacco Use   Smoking status: Former    Current packs/day: 0.00    Types: Cigarettes    Quit date: 2000    Years since quitting: 25.5   Smokeless tobacco: Never   Tobacco comments:    in college  Vaping Use   Vaping status: Never Used  Substance and Sexual Activity   Alcohol use: Not Currently   Drug use: No   Sexual activity: Not on file

## 2024-07-02 ENCOUNTER — Ambulatory Visit (INDEPENDENT_AMBULATORY_CARE_PROVIDER_SITE_OTHER): Payer: MEDICAID | Admitting: Orthopedic Surgery

## 2024-07-02 DIAGNOSIS — I890 Lymphedema, not elsewhere classified: Secondary | ICD-10-CM

## 2024-07-02 DIAGNOSIS — L97211 Non-pressure chronic ulcer of right calf limited to breakdown of skin: Secondary | ICD-10-CM

## 2024-07-02 DIAGNOSIS — I83012 Varicose veins of right lower extremity with ulcer of calf: Secondary | ICD-10-CM

## 2024-07-09 ENCOUNTER — Ambulatory Visit (INDEPENDENT_AMBULATORY_CARE_PROVIDER_SITE_OTHER): Payer: MEDICAID | Admitting: Physician Assistant

## 2024-07-09 DIAGNOSIS — L97211 Non-pressure chronic ulcer of right calf limited to breakdown of skin: Secondary | ICD-10-CM

## 2024-07-09 DIAGNOSIS — I83012 Varicose veins of right lower extremity with ulcer of calf: Secondary | ICD-10-CM

## 2024-07-09 DIAGNOSIS — I890 Lymphedema, not elsewhere classified: Secondary | ICD-10-CM | POA: Diagnosis not present

## 2024-07-10 ENCOUNTER — Encounter: Payer: Self-pay | Admitting: Physician Assistant

## 2024-07-10 NOTE — Progress Notes (Signed)
 Office Visit Note   Patient: Evan [name]           Date of Birth: 07-26-1971           MRN: [id number] Visit Date: 07/09/2024              Requested by: Elnor Lauraine BRAVO, NP [address] [place],  [place] [address] PCP: Elnor Lauraine BRAVO, NP  Chief Complaint  Patient presents with   Right Leg - Wound Check      HPI: Patient is a 53 year old gentleman who is seen in follow-up for venous ulceration right lower extremity. Patient has increased swelling and dermatitis.  He has received compression warp weekly.  He qualified for lymphedema pumps both UE/LE.    Assessment & Plan: Visit Diagnoses: No diagnosis found.  Plan: Vashe wet to dry dressing daily after shower with soap and water.    Follow-Up Instructions: No follow-ups on file.   Ortho Exam  Patient is alert, oriented, no adenopathy, well-dressed, normal affect, normal respiratory effort. Good compression with very minimal edema in the right LE.  The was cleaned removing yellow fibrinous tissue with a 4 x 4.  Wound measures 4 x 2.5 cm.  GT to nail was trimmed and dead skin was removed from toe tip.    Imaging: No results found.   Labs: Lab Results  Component Value Date   HGBA1C 5.3 09/20/2023   HGBA1C 5.5 10/18/2022   HGBA1C 5.6 04/17/2019   ESRSEDRATE 72 (H) 05/19/2019   ESRSEDRATE 59 (H) 05/05/2019   ESRSEDRATE 75 (H) 04/17/2019   CRP 65.7 (H) 06/23/2019   CRP 61.5 (H) 06/04/2019   CRP 47.8 (H) 05/26/2019   LABURIC 5.6 03/26/2022   REPTSTATUS 04/01/2022 FINAL 03/26/2022   GRAMSTAIN  04/17/2019    RARE WBC PRESENT,BOTH PMN AND MONONUCLEAR NO ORGANISMS SEEN    CULT  03/26/2022    NO GROWTH 5 DAYS Performed at [facility], [address] [place], [place] [address]      Lab Results  Component Value Date   ALBUMIN  4.0 09/20/2023   ALBUMIN  4.4 10/18/2022   ALBUMIN  3.8 10/17/2020    No results found for: MG Lab Results  Component Value Date   VD25OH 32.0 04/17/2019    No  results found for: PREALBUMIN    Latest Ref Rng & Units 09/20/2023    3:42 PM 10/18/2022    1:47 PM 03/28/2022    4:33 AM  CBC EXTENDED  WBC 4.0 - 10.5 K/uL 8.7  9.0  3.7   RBC 4.22 - 5.81 Mil/uL 4.81  4.89  3.87   Hemoglobin 13.0 - 17.0 g/dL 87.0  86.3  89.0   HCT 39.0 - 52.0 % 40.9  41.7  33.7   Platelets 150.0 - 400.0 K/uL 454.0  345.0  261      There is no height or weight on file to calculate BMI.  Orders:  No orders of the defined types were placed in this encounter.  No orders of the defined types were placed in this encounter.    Procedures: No procedures performed  Clinical Data: No additional findings.  ROS:  All other systems negative, except as noted in the HPI. Review of Systems  Objective: Vital Signs: There were no vitals taken for this visit.  Specialty Comments:  No specialty comments available.  PMFS History: Patient Active Problem List   Diagnosis Date Noted   Class 1 obesity with serious comorbidity and body mass  index (BMI) of 33.0 to 33.9 in adult 09/20/2023   Panic disorder 10/18/2022   Class 2 obesity with body mass index (BMI) of 36.0 to 36.9 in adult 10/18/2022   Neuropathy 10/18/2022   Venous stasis ulcer of calf limited to breakdown of skin with varicose veins (HCC)    Cellulitis 03/27/2022   AKI (acute kidney injury) (HCC) 03/26/2022   Multiple open wounds of lower leg 03/26/2022   Dyspnea on exertion 03/26/2022   Great toe pain, right 03/26/2022   IV drug abuse (HCC)    Septic arthritis of knee, right (HCC) 04/17/2019   Hardware complicating wound infection (HCC) 04/17/2019   Hepatitis C virus infection cured after antiviral drug therapy 04/17/2019   Acute lateral meniscus tear of right knee 01/23/2019   Displaced fracture of right tibial tuberosity, initial encounter for closed fracture 01/16/2019   Closed bicondylar fracture of right tibial plateau 01/15/2019   Cough 10/31/2016   Alcohol abuse with intoxication, with delirium  (HCC) 10/03/2016   Attention deficit disorder (ADD) in adult 10/03/2016   Depression 06/15/2014   Acute bronchitis 01/28/2014   Chronic pain syndrome 01/28/2014   Hepatitis, chronic persistent (HCC) 05/14/2013   ADD (attention deficit disorder) 05/11/2013   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   Eczema 12/30/2012   Disorder of joints of multiple sites 10/02/2012   Acute and subacute liver necrosis 04/24/2012   Metastatic melanoma 10/17/2011   Chest pain 10/17/2011   Insomnia 12/20/2008   Anxiety state 12/10/2007   ERECTILE DYSFUNCTION 12/10/2007   Essential hypertension 11/21/2007   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   Preventative health care 05/14/2011    Family History  Problem Relation Age of Onset   Cancer Father        melanoma on back   Cancer Other        pancreatic    Past Surgical History:  Procedure Laterality Date   EXTERNAL FIXATION LEG Right 01/16/2019   Procedure: EXTERNAL FIXATION RIGHT KNEE;  Surgeon: Kendal Franky SQUIBB, MD;  Location: [facility];  Service: Orthopedics;  Laterality: Right;   HARDWARE REMOVAL Right 04/17/2019   Procedure: HARDWARE REMOVAL RIGHT TIBIA;  Surgeon: Kendal Franky SQUIBB, MD;  Location: [facility];  Service: Orthopedics;  Laterality: Right;   IRRIGATION AND DEBRIDEMENT KNEE Right 04/17/2019   Procedure: IRRIGATION AND DEBRIDEMENT RIGHT KNEE;  Surgeon: Kendal Franky SQUIBB, MD;  Location: [facility];  Service: Orthopedics;  Laterality: Right;   LYMPHADENECTOMY Left    ORIF TIBIA PLATEAU Right 01/20/2019   Procedure: OPEN  REDUCTION INTERNAL FIXATION (ORIF) TIBIAL PLATEAU;  Surgeon: Kendal Franky SQUIBB, MD;  Location: [facility];  Service: Orthopedics;  Laterality: Right;   SHOULDER SURGERY Left    chronic L dislocating    Social History   Occupational History   Occupation: works in [employer]  Tobacco Use   Smoking status: Former    Current packs/day: 0.00    Types: Cigarettes    Quit date: 2000    Years since quitting: 25.5   Smokeless tobacco: Never   Tobacco comments:  in college  Vaping Use   Vaping status: Never Used  Substance and Sexual Activity   Alcohol use: Not Currently   Drug use: No   Sexual activity: Not on file

## 2024-07-12 ENCOUNTER — Encounter: Payer: Self-pay | Admitting: Orthopedic Surgery

## 2024-07-12 NOTE — Progress Notes (Signed)
 Office Visit Note   Patient: Evan [name]           Date of Birth: [birth year]           MRN: [id number] Visit Date: 07/02/2024              Requested by: Elnor Lauraine BRAVO, NP [address] [place],  [place] [address] PCP: Elnor Lauraine BRAVO, NP  Chief Complaint  Patient presents with   Right Leg - Follow-up      HPI: Patient is a 53 year old gentleman with mixed venous and lymphatic insufficiency both lower extremities with persistent ulceration right lower extremity.  Patient states that his pain is improving.  Assessment & Plan: Visit Diagnoses:  1. Venous stasis ulcer of right calf limited to breakdown of skin with varicose veins (HCC)   2. Lymphedema     Plan: Kerecis 8 cm was applied.  Will continue with compression wrap.  Follow-Up Instructions: Return in about 1 week (around 07/09/2024).   Ortho Exam  Patient is alert, oriented, no adenopathy, well-dressed, normal affect, normal respiratory effort. Examination patient has clear drainage with slight odor.  There is brawny skin color changes and 100% healthy granulation tissue.  After informed consent the wound was debrided and cleansed with Vashe.  After debridement wound measures 2.5 x 4 cm.  Kerecis 8 cm was applied plus compression wrap.  The wound healing has stalled, the wound bed has healthy granulation tissue, and patient presents for evaluation and application of Kerecis MariGen Micro graft. After informed consent a 10 blade knife was used to debride the skin and soft tissue to healthy viable bleeding granulation tissue.  Silver nitrate was used for hemostasis. The wound measures: 4 cm in length, 2.5cm  in width, 1 mm in depth, wound location anterior lateral right leg Kerecis MariGen micro tissue graft 8 cm2 was applied, and there was no wastage.  Please see the photo below of the Lot number and expiration date. The micro tissue graft was covered with a nonadherent Adaptic dressing, bolstered with 4 x 4  gauze and secured with a compression wrap.       Imaging: No results found.    Labs: Lab Results  Component Value Date   HGBA1C 5.3 09/20/2023   HGBA1C 5.5 10/18/2022   HGBA1C 5.6 04/17/2019   ESRSEDRATE 72 (H) 05/19/2019   ESRSEDRATE 59 (H) 05/05/2019   ESRSEDRATE 75 (H) 04/17/2019   CRP 65.7 (H) 06/23/2019   CRP 61.5 (H) 06/04/2019   CRP 47.8 (H) 05/26/2019   LABURIC 5.6 03/26/2022   REPTSTATUS 04/01/2022 FINAL 03/26/2022   GRAMSTAIN  04/17/2019    RARE WBC PRESENT,BOTH PMN AND MONONUCLEAR NO ORGANISMS SEEN    CULT  03/26/2022    NO GROWTH 5 DAYS Performed at [facility], [address] [place], [place] [address]      Lab Results  Component Value Date   ALBUMIN  4.0 09/20/2023   ALBUMIN  4.4 10/18/2022   ALBUMIN  3.8 10/17/2020    No results found for: MG Lab Results  Component Value Date   VD25OH 32.0 04/17/2019    No results found for: PREALBUMIN    Latest Ref Rng & Units 09/20/2023    3:42 PM 10/18/2022    1:47 PM 03/28/2022    4:33 AM  CBC EXTENDED  WBC 4.0 - 10.5 K/uL 8.7  9.0  3.7   RBC 4.22 - 5.81 Mil/uL 4.81  4.89  3.87   Hemoglobin 13.0 - 17.0  g/dL 87.0  86.3  89.0   HCT 39.0 - 52.0 % 40.9  41.7  33.7   Platelets 150.0 - 400.0 K/uL 454.0  345.0  261      There is no height or weight on file to calculate BMI.  Orders:  No orders of the defined types were placed in this encounter.  No orders of the defined types were placed in this encounter.    Procedures: No procedures performed  Clinical Data: No additional findings.  ROS:  All other systems negative, except as noted in the HPI. Review of Systems  Objective: Vital Signs: There were no vitals taken for this visit.  Specialty Comments:  No specialty comments available.  PMFS History: Patient Active Problem List   Diagnosis Date Noted   Class 1 obesity with serious comorbidity and body mass index (BMI) of 33.0 to 33.9 in adult 09/20/2023   Panic disorder  10/18/2022   Class 2 obesity with body mass index (BMI) of 36.0 to 36.9 in adult 10/18/2022   Neuropathy 10/18/2022   Venous stasis ulcer of calf limited to breakdown of skin with varicose veins (HCC)    Cellulitis 03/27/2022   AKI (acute kidney injury) (HCC) 03/26/2022   Multiple open wounds of lower leg 03/26/2022   Dyspnea on exertion 03/26/2022   Great toe pain, right 03/26/2022   IV drug abuse (HCC)    Septic arthritis of knee, right (HCC) 04/17/2019   Hardware complicating wound infection (HCC) 04/17/2019   Hepatitis C virus infection cured after antiviral drug therapy 04/17/2019   Acute lateral meniscus tear of right knee 01/23/2019   Displaced fracture of right tibial tuberosity, initial encounter for closed fracture 01/16/2019   Closed bicondylar fracture of right tibial plateau 01/15/2019   Cough 10/31/2016   Alcohol abuse with intoxication, with delirium (HCC) 10/03/2016   Attention deficit disorder (ADD) in adult 10/03/2016   Depression 06/15/2014   Acute bronchitis 01/28/2014   Chronic pain syndrome 01/28/2014   Hepatitis, chronic persistent (HCC) 05/14/2013   ADD (attention deficit disorder) 05/11/2013   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   Eczema 12/30/2012   Disorder of joints of multiple sites 10/02/2012   Acute and subacute liver necrosis 04/24/2012   Metastatic melanoma 10/17/2011   Chest pain 10/17/2011   Insomnia 12/20/2008   Anxiety state 12/10/2007   ERECTILE DYSFUNCTION 12/10/2007   Essential hypertension 11/21/2007   Past Medical History:  Diagnosis Date   ADD (attention deficit disorder) 05/11/2013   ANXIETY 12/10/2007   BACK PAIN 08/03/2009   Chronic pain syndrome 01/28/2014   Depression 06/15/2014   ERECTILE DYSFUNCTION 12/10/2007   Hepatitis C 05/15/2013     treated and cured   HYPERTENSION 11/21/2007   INSOMNIA-SLEEP DISORDER-UNSPEC 12/20/2008   Melanoma (HCC) 2015   Lymph nodes left side , radiation   Neuropathy     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   Preventative health care 05/14/2011    Family History  Problem Relation Age of Onset   Cancer Father        melanoma on back   Cancer Other        pancreatic    Past Surgical History:  Procedure Laterality Date   EXTERNAL FIXATION LEG Right 01/16/2019   Procedure: EXTERNAL FIXATION RIGHT KNEE;  Surgeon: Kendal Franky SQUIBB, MD;  Location: [facility];  Service: Orthopedics;  Laterality: Right;   HARDWARE REMOVAL Right 04/17/2019   Procedure: HARDWARE REMOVAL RIGHT TIBIA;  Surgeon: Kendal Franky SQUIBB, MD;  Location: [facility]  OR;  Service: Orthopedics;  Laterality: Right;   IRRIGATION AND DEBRIDEMENT KNEE Right 04/17/2019   Procedure: IRRIGATION AND DEBRIDEMENT RIGHT KNEE;  Surgeon: Kendal Franky SQUIBB, MD;  Location: [facility];  Service: Orthopedics;  Laterality: Right;   LYMPHADENECTOMY Left    ORIF TIBIA PLATEAU Right 01/20/2019   Procedure: OPEN REDUCTION INTERNAL FIXATION (ORIF) TIBIAL PLATEAU;  Surgeon: Kendal Franky SQUIBB, MD;  Location: [facility];  Service: Orthopedics;  Laterality: Right;   SHOULDER SURGERY Left    chronic L dislocating    Social History   Occupational History   Occupation: works in [employer]  Tobacco Use   Smoking status: Former    Current packs/day: 0.00    Types: Cigarettes    Quit date: 2000    Years since quitting: 25.5   Smokeless tobacco: Never   Tobacco comments:    in college  Vaping Use   Vaping status: Never Used  Substance and Sexual Activity   Alcohol use: Not Currently   Drug use: No   Sexual activity: Not on file

## 2024-07-16 ENCOUNTER — Ambulatory Visit (INDEPENDENT_AMBULATORY_CARE_PROVIDER_SITE_OTHER): Payer: MEDICAID | Admitting: Orthopedic Surgery

## 2024-07-16 DIAGNOSIS — I890 Lymphedema, not elsewhere classified: Secondary | ICD-10-CM

## 2024-07-16 DIAGNOSIS — L97211 Non-pressure chronic ulcer of right calf limited to breakdown of skin: Secondary | ICD-10-CM | POA: Diagnosis not present

## 2024-07-16 DIAGNOSIS — I83012 Varicose veins of right lower extremity with ulcer of calf: Secondary | ICD-10-CM | POA: Diagnosis not present

## 2024-07-23 ENCOUNTER — Ambulatory Visit (INDEPENDENT_AMBULATORY_CARE_PROVIDER_SITE_OTHER): Payer: MEDICAID | Admitting: Orthopedic Surgery

## 2024-07-23 DIAGNOSIS — L97211 Non-pressure chronic ulcer of right calf limited to breakdown of skin: Secondary | ICD-10-CM

## 2024-07-23 DIAGNOSIS — I83012 Varicose veins of right lower extremity with ulcer of calf: Secondary | ICD-10-CM | POA: Diagnosis not present

## 2024-07-23 DIAGNOSIS — I890 Lymphedema, not elsewhere classified: Secondary | ICD-10-CM | POA: Diagnosis not present

## 2024-07-26 ENCOUNTER — Encounter: Payer: Self-pay | Admitting: Orthopedic Surgery

## 2024-07-26 NOTE — Progress Notes (Signed)
 Office Visit Note   Patient: Evan [name]           Date of Birth: 01-28-1971           MRN: [id number] Visit Date: 07/16/2024              Requested by: Elnor Lauraine BRAVO, NP [address] 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  Patient had an office graft 2 weeks ago.  Patient feels like the wound is showing steady improvement.  Assessment & Plan: Visit Diagnoses:  1. Venous stasis ulcer of right calf limited to breakdown of skin with varicose veins (HCC)   2. Lymphedema     Plan: 8 cm of Kerecis micro graft was applied.  Reevaluate in 1 week.  Follow-Up Instructions: No follow-ups on file.   Ortho Exam  Patient is alert, oriented, no adenopathy, well-dressed, normal affect, normal respiratory effort. Examination the wound bed has flat healthy granulation tissue there is no cellulitis no dermatitis no maceration.  Wound measures 2 x 3.5 cm.  The wound healing has stalled, the wound bed has healthy granulation tissue, and patient presents for evaluation and application of Kerecis MariGen Micro graft. After informed consent a 10 blade knife was used to debride the skin and soft tissue to healthy viable bleeding granulation tissue.  Silver nitrate was used for hemostasis. The wound measures: 2.0 cm in length, 3 .5 cm  in width, 1 mm in depth, wound location anterior right leg Kerecis MariGen micro tissue graft 8 cm2 was applied, and there was no wastage.  Please see the photo below of the Lot number and expiration date. The micro tissue graft was covered with a nonadherent Adaptic dressing, bolstered with 4 x 4 gauze and secured with a compression wrap.       Imaging: No results found.    Labs: Lab Results  Component Value Date   HGBA1C 5.3 09/20/2023    HGBA1C 5.5 10/18/2022   HGBA1C 5.6 04/17/2019   ESRSEDRATE 72 (H) 05/19/2019   ESRSEDRATE 59 (H) 05/05/2019   ESRSEDRATE 75 (H) 04/17/2019   CRP 65.7 (H) 06/23/2019   CRP 61.5 (H) 06/04/2019   CRP 47.8 (H) 05/26/2019   LABURIC 5.6 03/26/2022   REPTSTATUS 04/01/2022 FINAL 03/26/2022   GRAMSTAIN  04/17/2019    RARE WBC PRESENT,BOTH PMN AND MONONUCLEAR NO ORGANISMS SEEN    CULT  03/26/2022    NO GROWTH 5 DAYS Performed at [facility], [address], [place], [place] [address]      Lab Results  Component Value Date   ALBUMIN  4.0 09/20/2023   ALBUMIN  4.4 10/18/2022   ALBUMIN  3.8 10/17/2020    No results found for: MG Lab Results  Component Value Date   VD25OH 32.0 04/17/2019    No results found for: PREALBUMIN    Latest Ref Rng & Units 09/20/2023    3:42 PM 10/18/2022    1:47 PM 03/28/2022    4:33 AM  CBC EXTENDED  WBC 4.0 - 10.5 K/uL 8.7  9.0  3.7   RBC 4.22 - 5.81 Mil/uL 4.81  4.89  3.87   Hemoglobin 13.0 - 17.0 g/dL 87.0  86.3  89.0   HCT 39.0 - 52.0 % 40.9  41.7  33.7  Platelets 150.0 - 400.0 K/uL 454.0  345.0  261      There is no height or weight on file to calculate BMI.  Orders:  No orders of the defined types were placed in this encounter.  No orders of the defined types were placed in this encounter.    Procedures: No procedures performed  Clinical Data: No additional findings.  ROS:  All other systems negative, except as noted in the HPI. Review of Systems  Objective: Vital Signs: There were no vitals taken for this visit.  Specialty Comments:  No specialty comments available.  PMFS History: Patient Active Problem List   Diagnosis Date Noted   Class 1 obesity with serious comorbidity and body mass index (BMI) of 33.0 to 33.9 in adult 09/20/2023   Panic disorder 10/18/2022   Class 2 obesity with body mass index (BMI) of 36.0 to 36.9 in adult 10/18/2022   Neuropathy 10/18/2022   Venous stasis ulcer of calf limited to  breakdown of skin with varicose veins (HCC)    Cellulitis 03/27/2022   AKI (acute kidney injury) (HCC) 03/26/2022   Multiple open wounds of lower leg 03/26/2022   Dyspnea on exertion 03/26/2022   Great toe pain, right 03/26/2022   IV drug abuse (HCC)    Septic arthritis of knee, right (HCC) 04/17/2019   Hardware complicating wound infection (HCC) 04/17/2019   Hepatitis C virus infection cured after antiviral drug therapy 04/17/2019   Acute lateral meniscus tear of right knee 01/23/2019   Displaced fracture of right tibial tuberosity, initial encounter for closed fracture 01/16/2019   Closed bicondylar fracture of right tibial plateau 01/15/2019   Cough 10/31/2016   Alcohol abuse with intoxication, with delirium (HCC) 10/03/2016   Attention deficit disorder (ADD) in adult 10/03/2016   Depression 06/15/2014   Acute bronchitis 01/28/2014   Chronic pain syndrome 01/28/2014   Hepatitis, chronic persistent (HCC) 05/14/2013   ADD (attention deficit disorder) 05/11/2013   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   Eczema 12/30/2012   Disorder of joints of multiple sites 10/02/2012   Acute and subacute liver necrosis 04/24/2012   Metastatic melanoma 10/17/2011   Chest pain 10/17/2011   Insomnia 12/20/2008   Anxiety state 12/10/2007   ERECTILE DYSFUNCTION 12/10/2007   Essential hypertension 11/21/2007   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   Preventative health care 05/14/2011    Family History  Problem Relation Age of Onset   Cancer Father         melanoma on back   Cancer Other        pancreatic    Past Surgical History:  Procedure Laterality Date   EXTERNAL FIXATION LEG Right 01/16/2019   Procedure: EXTERNAL FIXATION RIGHT KNEE;  Surgeon: Kendal Franky SQUIBB, MD;  Location: [facility];  Service: Orthopedics;  Laterality: Right;   HARDWARE REMOVAL Right 04/17/2019   Procedure: HARDWARE REMOVAL RIGHT TIBIA;  Surgeon: Kendal Franky SQUIBB, MD;  Location: [facility];  Service: Orthopedics;  Laterality: Right;   IRRIGATION AND DEBRIDEMENT KNEE Right 04/17/2019   Procedure: IRRIGATION AND  DEBRIDEMENT RIGHT KNEE;  Surgeon: Kendal Franky SQUIBB, MD;  Location: [facility];  Service: Orthopedics;  Laterality: Right;   LYMPHADENECTOMY Left    ORIF TIBIA PLATEAU Right 01/20/2019   Procedure: OPEN REDUCTION INTERNAL FIXATION (ORIF) TIBIAL PLATEAU;  Surgeon: Kendal Franky SQUIBB, MD;  Location: [facility];  Service: Orthopedics;  Laterality: Right;   SHOULDER SURGERY Left    chronic L dislocating    Social History   Occupational History   Occupation: works in [employer]  Tobacco Use   Smoking status: Former    Current packs/day: 0.00    Types: Cigarettes    Quit date: 2000    Years since quitting: 25.6   Smokeless tobacco: Never   Tobacco comments:    in college  Vaping Use   Vaping status: Never Used  Substance and Sexual Activity   Alcohol use: Not Currently   Drug use: No   Sexual activity: Not on file

## 2024-07-27 ENCOUNTER — Encounter: Payer: Self-pay | Admitting: Orthopedic Surgery

## 2024-07-27 NOTE — Progress Notes (Signed)
 Office Visit Note   Patient: Evan [name]           Date of Birth: 1971/02/24           MRN: [id number] Visit Date: 07/23/2024              Requested by: Elnor Lauraine BRAVO, NP [address] [place],  [place] [address] PCP: Elnor Lauraine BRAVO, NP  Chief Complaint  Patient presents with   Right Leg - Follow-up, Wound Check      HPI: Patient is a 53 year old gentleman who is seen in follow-up for venous and lymphatic insufficiency ulcer of right leg.  Assessment & Plan: Visit Diagnoses: No diagnosis found.  Plan: Continue with Vashe dressing changes.  Follow-Up Instructions: Return in about 2 weeks (around 08/06/2024).   Ortho Exam  Patient is alert, oriented, no adenopathy, well-dressed, normal affect, normal respiratory effort. Wound bed has improved granulation tissue with superficial epithelization around the wound edges.  Wound measures 2.5 x 1.5 cm.    Imaging: No results found.   Labs: Lab Results  Component Value Date   HGBA1C 5.3 09/20/2023   HGBA1C 5.5 10/18/2022   HGBA1C 5.6 04/17/2019   ESRSEDRATE 72 (H) 05/19/2019   ESRSEDRATE 59 (H) 05/05/2019   ESRSEDRATE 75 (H) 04/17/2019   CRP 65.7 (H) 06/23/2019   CRP 61.5 (H) 06/04/2019   CRP 47.8 (H) 05/26/2019   LABURIC 5.6 03/26/2022   REPTSTATUS 04/01/2022 FINAL 03/26/2022   GRAMSTAIN  04/17/2019    RARE WBC PRESENT,BOTH PMN AND MONONUCLEAR NO ORGANISMS SEEN    CULT  03/26/2022    NO GROWTH 5 DAYS Performed at [facility], [address] [place], [place] [address]      Lab Results  Component Value Date   ALBUMIN  4.0 09/20/2023   ALBUMIN  4.4 10/18/2022   ALBUMIN  3.8 10/17/2020    No results found for: MG Lab Results  Component Value Date   VD25OH 32.0 04/17/2019    No results found for: PREALBUMIN    Latest Ref Rng & Units 09/20/2023    3:42 PM 10/18/2022    1:47 PM 03/28/2022    4:33 AM  CBC EXTENDED  WBC 4.0 - 10.5 K/uL 8.7  9.0  3.7   RBC 4.22 - 5.81 Mil/uL 4.81  4.89   3.87   Hemoglobin 13.0 - 17.0 g/dL 87.0  86.3  89.0   HCT 39.0 - 52.0 % 40.9  41.7  33.7   Platelets 150.0 - 400.0 K/uL 454.0  345.0  261      There is no height or weight on file to calculate BMI.  Orders:  No orders of the defined types were placed in this encounter.  No orders of the defined types were placed in this encounter.    Procedures: No procedures performed  Clinical Data: No additional findings.  ROS:  All other systems negative, except as noted in the HPI. Review of Systems  Objective: Vital Signs: There were no vitals taken for this visit.  Specialty Comments:  No specialty comments available.  PMFS History: Patient Active Problem List   Diagnosis Date Noted   Class 1 obesity with serious comorbidity and body mass index (BMI) of 33.0 to 33.9 in adult 09/20/2023   Panic disorder 10/18/2022   Class 2 obesity with body mass index (BMI) of 36.0 to 36.9 in adult 10/18/2022   Neuropathy 10/18/2022   Venous stasis ulcer of calf limited to breakdown of skin with varicose  veins (HCC)    Cellulitis 03/27/2022   AKI (acute kidney injury) (HCC) 03/26/2022   Multiple open wounds of lower leg 03/26/2022   Dyspnea on exertion 03/26/2022   Great toe pain, right 03/26/2022   IV drug abuse (HCC)    Septic arthritis of knee, right (HCC) 04/17/2019   Hardware complicating wound infection (HCC) 04/17/2019   Hepatitis C virus infection cured after antiviral drug therapy 04/17/2019   Acute lateral meniscus tear of right knee 01/23/2019   Displaced fracture of right tibial tuberosity, initial encounter for closed fracture 01/16/2019   Closed bicondylar fracture of right tibial plateau 01/15/2019   Cough 10/31/2016   Alcohol abuse with intoxication, with delirium (HCC) 10/03/2016   Attention deficit disorder (ADD) in adult 10/03/2016   Depression 06/15/2014   Acute bronchitis 01/28/2014   Chronic pain syndrome 01/28/2014   Hepatitis, chronic persistent (HCC) 05/14/2013    ADD (attention deficit disorder) 05/11/2013   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   Eczema 12/30/2012   Disorder of joints of multiple sites 10/02/2012   Acute and subacute liver necrosis 04/24/2012   Metastatic melanoma 10/17/2011   Chest pain 10/17/2011   Insomnia 12/20/2008   Anxiety state 12/10/2007   ERECTILE DYSFUNCTION 12/10/2007   Essential hypertension 11/21/2007   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   Preventative health care 05/14/2011    Family History  Problem Relation Age of Onset   Cancer Father        melanoma on back   Cancer Other        pancreatic    Past Surgical History:  Procedure Laterality Date   EXTERNAL FIXATION LEG Right 01/16/2019   Procedure: EXTERNAL FIXATION RIGHT KNEE;  Surgeon: Kendal Franky SQUIBB, MD;  Location: [facility];  Service: Orthopedics;  Laterality: Right;   HARDWARE REMOVAL Right 04/17/2019   Procedure: HARDWARE REMOVAL RIGHT TIBIA;  Surgeon: Kendal Franky SQUIBB, MD;  Location: [facility];  Service: Orthopedics;  Laterality: Right;   IRRIGATION AND DEBRIDEMENT KNEE Right 04/17/2019   Procedure: IRRIGATION AND DEBRIDEMENT RIGHT KNEE;  Surgeon: Kendal Franky SQUIBB, MD;  Location: [facility];  Service: Orthopedics;  Laterality: Right;   LYMPHADENECTOMY Left    ORIF TIBIA PLATEAU Right 01/20/2019   Procedure: OPEN REDUCTION INTERNAL FIXATION (ORIF) TIBIAL PLATEAU;  Surgeon: Kendal Franky SQUIBB, MD;  Location: [facility];  Service: Orthopedics;  Laterality: Right;   SHOULDER SURGERY Left    chronic L dislocating    Social History    Occupational History   Occupation: works in [employer]  Tobacco Use   Smoking status: Former    Current packs/day: 0.00    Types: Cigarettes    Quit date: 2000    Years since quitting: 25.6   Smokeless tobacco: Never   Tobacco comments:    in college  Vaping Use   Vaping status: Never Used  Substance and Sexual Activity   Alcohol use: Not Currently   Drug use: No   Sexual activity: Not on file

## 2024-08-04 ENCOUNTER — Ambulatory Visit: Payer: MEDICAID | Admitting: Orthopedic Surgery

## 2024-08-04 DIAGNOSIS — L97211 Non-pressure chronic ulcer of right calf limited to breakdown of skin: Secondary | ICD-10-CM | POA: Diagnosis not present

## 2024-08-04 DIAGNOSIS — I890 Lymphedema, not elsewhere classified: Secondary | ICD-10-CM

## 2024-08-04 DIAGNOSIS — I83012 Varicose veins of right lower extremity with ulcer of calf: Secondary | ICD-10-CM

## 2024-08-05 ENCOUNTER — Encounter: Payer: Self-pay | Admitting: Orthopedic Surgery

## 2024-08-05 NOTE — Progress Notes (Signed)
 Office Visit Note   Patient: Evan [name]           Date of Birth: 1971/01/22           MRN: [id number] Visit Date: 08/04/2024              Requested by: Elnor Lauraine BRAVO, NP [address] [place],  [place] [address] PCP: Elnor Lauraine BRAVO, NP  Chief Complaint  Patient presents with   Right Leg - Wound Check      HPI: Patient is a 53 year old gentleman who presents in follow-up for venous and lymphatic ulceration right lower extremity.  Assessment & Plan: Visit Diagnoses:  1. Venous stasis ulcer of right calf limited to breakdown of skin with varicose veins (HCC)   2. Lymphedema     Plan: Wound continues to shows slow steady progress.  Continue with Vashe dressing changes.  Follow-Up Instructions: Return in about 2 weeks (around 08/18/2024).   Ortho Exam  Patient is alert, oriented, no adenopathy, well-dressed, normal affect, normal respiratory effort. Examination there is hypergranulation tissue this was touched with silver nitrate.  The dermatitis is improving and the epithelialization around the wound is improving.  The current wound measures 1 x 3 cm.    Imaging: No results found.    Labs: Lab Results  Component Value Date   HGBA1C 5.3 09/20/2023   HGBA1C 5.5 10/18/2022   HGBA1C 5.6 04/17/2019   ESRSEDRATE 72 (H) 05/19/2019   ESRSEDRATE 59 (H) 05/05/2019   ESRSEDRATE 75 (H) 04/17/2019   CRP 65.7 (H) 06/23/2019   CRP 61.5 (H) 06/04/2019   CRP 47.8 (H) 05/26/2019   LABURIC 5.6 03/26/2022   REPTSTATUS 04/01/2022 FINAL 03/26/2022   GRAMSTAIN  04/17/2019    RARE WBC PRESENT,BOTH PMN AND MONONUCLEAR NO ORGANISMS SEEN    CULT  03/26/2022    NO GROWTH 5 DAYS Performed at [facility], [address] [place], [place] [address]      Lab Results  Component Value Date   ALBUMIN  4.0 09/20/2023   ALBUMIN  4.4 10/18/2022   ALBUMIN  3.8 10/17/2020    No results found for: MG Lab Results  Component Value Date   VD25OH 32.0 04/17/2019    No  results found for: PREALBUMIN    Latest Ref Rng & Units 09/20/2023    3:42 PM 10/18/2022    1:47 PM 03/28/2022    4:33 AM  CBC EXTENDED  WBC 4.0 - 10.5 K/uL 8.7  9.0  3.7   RBC 4.22 - 5.81 Mil/uL 4.81  4.89  3.87   Hemoglobin 13.0 - 17.0 g/dL 87.0  86.3  89.0   HCT 39.0 - 52.0 % 40.9  41.7  33.7   Platelets 150.0 - 400.0 K/uL 454.0  345.0  261      There is no height or weight on file to calculate BMI.  Orders:  No orders of the defined types were placed in this encounter.  No orders of the defined types were placed in this encounter.    Procedures: No procedures performed  Clinical Data: No additional findings.  ROS:  All other systems negative, except as noted in the HPI. Review of Systems  Objective: Vital Signs: There were no vitals taken for this visit.  Specialty Comments:  No specialty comments available.  PMFS History: Patient Active Problem List   Diagnosis Date Noted   Class 1 obesity with serious comorbidity and body mass index (BMI) of 33.0 to 33.9 in adult 09/20/2023  Panic disorder 10/18/2022   Class 2 obesity with body mass index (BMI) of 36.0 to 36.9 in adult 10/18/2022   Neuropathy 10/18/2022   Venous stasis ulcer of calf limited to breakdown of skin with varicose veins (HCC)    Cellulitis 03/27/2022   AKI (acute kidney injury) (HCC) 03/26/2022   Multiple open wounds of lower leg 03/26/2022   Dyspnea on exertion 03/26/2022   Great toe pain, right 03/26/2022   IV drug abuse (HCC)    Septic arthritis of knee, right (HCC) 04/17/2019   Hardware complicating wound infection (HCC) 04/17/2019   Hepatitis C virus infection cured after antiviral drug therapy 04/17/2019   Acute lateral meniscus tear of right knee 01/23/2019   Displaced fracture of right tibial tuberosity, initial encounter for closed fracture 01/16/2019   Closed bicondylar fracture of right tibial plateau 01/15/2019   Cough 10/31/2016   Alcohol abuse with intoxication, with delirium  (HCC) 10/03/2016   Attention deficit disorder (ADD) in adult 10/03/2016   Depression 06/15/2014   Acute bronchitis 01/28/2014   Chronic pain syndrome 01/28/2014   Hepatitis, chronic persistent (HCC) 05/14/2013   ADD (attention deficit disorder) 05/11/2013   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   Eczema 12/30/2012   Disorder of joints of multiple sites 10/02/2012   Acute and subacute liver necrosis 04/24/2012   Metastatic melanoma 10/17/2011   Chest pain 10/17/2011   Insomnia 12/20/2008   Anxiety state 12/10/2007   ERECTILE DYSFUNCTION 12/10/2007   Essential hypertension 11/21/2007   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   Preventative health care 05/14/2011    Family History  Problem Relation Age of Onset   Cancer Father        melanoma on back   Cancer Other        pancreatic    Past Surgical History:  Procedure Laterality Date   EXTERNAL FIXATION LEG Right 01/16/2019   Procedure: EXTERNAL FIXATION RIGHT KNEE;  Surgeon: Kendal Franky SQUIBB, MD;  Location: [facility];  Service: Orthopedics;  Laterality: Right;   HARDWARE REMOVAL Right 04/17/2019   Procedure: HARDWARE REMOVAL RIGHT TIBIA;  Surgeon: Kendal Franky SQUIBB, MD;  Location: [facility];  Service: Orthopedics;  Laterality: Right;   IRRIGATION AND DEBRIDEMENT KNEE Right 04/17/2019   Procedure: IRRIGATION AND DEBRIDEMENT RIGHT KNEE;  Surgeon: Kendal Franky SQUIBB, MD;  Location: [facility];  Service: Orthopedics;  Laterality: Right;   LYMPHADENECTOMY Left    ORIF TIBIA PLATEAU Right 01/20/2019   Procedure: OPEN  REDUCTION INTERNAL FIXATION (ORIF) TIBIAL PLATEAU;  Surgeon: Kendal Franky SQUIBB, MD;  Location: [facility];  Service: Orthopedics;  Laterality: Right;   SHOULDER SURGERY Left    chronic L dislocating    Social History   Occupational History   Occupation: works in [employer]  Tobacco Use   Smoking status: Former    Current packs/day: 0.00    Types: Cigarettes    Quit date: 2000    Years since quitting: 25.6   Smokeless tobacco: Never   Tobacco comments:    in [employer]rap[employer], art  status: Never Used  Substance and Sexual Activity   Alcohol use: Not Currently   Drug use: No   Sexual activity: Not on file

## 2024-08-10 ENCOUNTER — Other Ambulatory Visit: Payer: Self-pay | Admitting: Nurse Practitioner

## 2024-08-10 DIAGNOSIS — G629 Polyneuropathy, unspecified: Secondary | ICD-10-CM

## 2024-08-20 ENCOUNTER — Encounter: Payer: Self-pay | Admitting: Orthopedic Surgery

## 2024-08-20 ENCOUNTER — Ambulatory Visit (INDEPENDENT_AMBULATORY_CARE_PROVIDER_SITE_OTHER): Payer: MEDICAID | Admitting: Orthopedic Surgery

## 2024-08-20 DIAGNOSIS — L97211 Non-pressure chronic ulcer of right calf limited to breakdown of skin: Secondary | ICD-10-CM | POA: Diagnosis not present

## 2024-08-20 DIAGNOSIS — I83012 Varicose veins of right lower extremity with ulcer of calf: Secondary | ICD-10-CM | POA: Diagnosis not present

## 2024-08-20 MED ORDER — FLUCONAZOLE 150 MG PO TABS: 150.0000 mg | ORAL_TABLET | ORAL | 0 refills | Status: AC

## 2024-08-20 NOTE — Progress Notes (Signed)
 Office Visit Note   Patient: Evan [name]           Date of Birth: 30-Aug-1971           MRN: [id number] Visit Date: 08/20/2024              Requested by: Elnor Lauraine BRAVO, NP [address] [place],  [place] [address] PCP: Elnor Lauraine BRAVO, NP  Chief Complaint  Patient presents with   Right Leg - Wound Check      HPI: Discussed the use of AI scribe software for clinical note transcription with the patient, who gave verbal consent to proceed.  History of Present Illness Evan [name] is a 53 year old male who presents with a wound and groin fungal infection. He is accompanied by his father.  He has been managing a wound by washing and using dampened gauze. He removed the bandage before the visit, which caused some bleeding. His father noted that the wound looks significantly better. He is out of supplies and requests more to continue his wound care regimen.  He is experiencing a groin fungal infection, described as jock itch. He has used a prescription ointment in the past, which is now depleted, and has been trying over-the-counter treatments without success. He mentions using a spray, cream, and powder, but these have not alleviated the itching. He is seeking a prescription for a medication that previously resolved the issue quickly.     Assessment & Plan: Visit Diagnoses:  1. Venous stasis ulcer of right calf limited to breakdown of skin with varicose veins (HCC)     Plan: Assessment and Plan Assessment & Plan Chronic lower extremity wound with healthy granulation tissue Wound 4 cm x 2 cm with 100% healthy granulation tissue, no infection or complications. - Continue wound care regimen with Bosch-dampened 4x4 gauze.  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  Tinea cruris (jock itch) Persistent tinea cruris unresponsive to OTC treatments, causing discomfort and itching. - Prescribe  antifungal medication, send to [facility]. - Recommend athlete's foot powder for drying. - Advise wearing wool or moisture-wicking underwear.  Follow-Up Follow-up necessary to monitor wound healing and tinea cruris treatment response. - Schedule follow-up in two weeks.      Follow-Up Instructions: Return in about 2 weeks (around 09/03/2024).   Ortho Exam  Patient is alert, oriented, no adenopathy, well-dressed, normal affect, normal respiratory effort. Physical Exam SKIN: Wound with 100% healthy granulation tissue. Venous and lymphatic dermatitis, no cellulitis.      Imaging: No results found.   Labs: Lab Results  Component Value Date   HGBA1C 5.3 09/20/2023   HGBA1C 5.5 10/18/2022   HGBA1C 5.6 04/17/2019   ESRSEDRATE 72 (H) 05/19/2019   ESRSEDRATE 59 (H) 05/05/2019   ESRSEDRATE 75 (H) 04/17/2019   CRP 65.7 (H) 06/23/2019   CRP 61.5 (H) 06/04/2019   CRP 47.8 (H) 05/26/2019   LABURIC 5.6 03/26/2022   REPTSTATUS 04/01/2022 FINAL 03/26/2022   GRAMSTAIN  04/17/2019    RARE WBC PRESENT,BOTH PMN AND MONONUCLEAR NO ORGANISMS SEEN    CULT  03/26/2022    NO GROWTH 5 DAYS Performed at [facility], [address] [place], [place] [address]      Lab Results  Component Value Date   ALBUMIN  4.0 09/20/2023   ALBUMIN  4.4 10/18/2022   ALBUMIN  3.8 10/17/2020    No results found for: MG Lab Results  Component Value Date  VD25OH 32.0 04/17/2019    No results found for: PREALBUMIN    Latest Ref Rng & Units 09/20/2023    3:42 PM 10/18/2022    1:47 PM 03/28/2022    4:33 AM  CBC EXTENDED  WBC 4.0 - 10.5 K/uL 8.7  9.0  3.7   RBC 4.22 - 5.81 Mil/uL 4.81  4.89  3.87   Hemoglobin 13.0 - 17.0 g/dL 87.0  86.3  89.0   HCT 39.0 - 52.0 % 40.9  41.7  33.7   Platelets 150.0 - 400.0 K/uL 454.0  345.0  261      There is no height or weight on file to calculate BMI.  Orders:  No orders of the defined types were placed in this encounter.  Meds ordered this  encounter  Medications   fluconazole  (DIFLUCAN ) 150 MG tablet    Sig: Take 1 tablet (150 mg total) by mouth once a week.    Dispense:  3 tablet    Refill:  0     Procedures: No procedures performed  Clinical Data: No additional findings.  ROS:  All other systems negative, except as noted in the HPI. Review of Systems  Objective: Vital Signs: There were no vitals taken for this visit.  Specialty Comments:  No specialty comments available.  PMFS History: Patient Active Problem List   Diagnosis Date Noted   Class 1 obesity with serious comorbidity and body mass index (BMI) of 33.0 to 33.9 in adult 09/20/2023   Panic disorder 10/18/2022   Class 2 obesity with body mass index (BMI) of 36.0 to 36.9 in adult 10/18/2022   Neuropathy 10/18/2022   Venous stasis ulcer of calf limited to breakdown of skin with varicose veins (HCC)    Cellulitis 03/27/2022   AKI (acute kidney injury) (HCC) 03/26/2022   Multiple open wounds of lower leg 03/26/2022   Dyspnea on exertion 03/26/2022   Great toe pain, right 03/26/2022   IV drug abuse (HCC)    Septic arthritis of knee, right (HCC) 04/17/2019   Hardware complicating wound infection (HCC) 04/17/2019   Hepatitis C virus infection cured after antiviral drug therapy 04/17/2019   Acute lateral meniscus tear of right knee 01/23/2019   Displaced fracture of right tibial tuberosity, initial encounter for closed fracture 01/16/2019   Closed bicondylar fracture of right tibial plateau 01/15/2019   Cough 10/31/2016   Alcohol abuse with intoxication, with delirium (HCC) 10/03/2016   Attention deficit disorder (ADD) in adult 10/03/2016   Depression 06/15/2014   Acute bronchitis 01/28/2014   Chronic pain syndrome 01/28/2014   Hepatitis, chronic persistent (HCC) 05/14/2013   ADD (attention deficit disorder) 05/11/2013   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   Eczema 12/30/2012   Disorder of joints of multiple sites 10/02/2012   Acute  and subacute liver necrosis 04/24/2012   Metastatic melanoma 10/17/2011   Chest pain 10/17/2011   Insomnia 12/20/2008   Anxiety state 12/10/2007   ERECTILE DYSFUNCTION 12/10/2007   Essential hypertension 11/21/2007   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   Preventative health care  05/14/2011    Family History  Problem Relation Age of Onset   Cancer Father        melanoma on back   Cancer Other        pancreatic    Past Surgical History:  Procedure Laterality Date   EXTERNAL FIXATION LEG Right 01/16/2019   Procedure: EXTERNAL FIXATION RIGHT KNEE;  Surgeon: Kendal Franky SQUIBB, MD;  Location: [facility] OR;  Service: Orthopedics;  Laterality: Right;   HARDWARE REMOVAL Right 04/17/2019   Procedure: HARDWARE REMOVAL RIGHT TIBIA;  Surgeon: Kendal Franky SQUIBB, MD;  Location: [facility] OR;  Service: Orthopedics;  Laterality: Right;   IRRIGATION AND DEBRIDEMENT KNEE Right 04/17/2019   Procedure: IRRIGATION AND DEBRIDEMENT RIGHT KNEE;  Surgeon: Kendal Franky SQUIBB, MD;  Location: [facility] OR;  Service: Orthopedics;  Laterality: Right;   LYMPHADENECTOMY Left    ORIF TIBIA PLATEAU Right 01/20/2019   Procedure: OPEN REDUCTION INTERNAL FIXATION (ORIF) TIBIAL PLATEAU;  Surgeon: Kendal Franky SQUIBB, MD;  Location: [facility] OR;  Service: Orthopedics;  Laterality: Right;   SHOULDER SURGERY Left    chronic L dislocating    Social History   Occupational History   Occupation: works in [employer]  Tobacco Use   Smoking status: Former    Current packs/day: 0.00    Types: Cigarettes    Quit date: 2000    Years since  quitting: 25.6   Smokeless tobacco: Never   Tobacco comments:    in college  Vaping Use   Vaping status: Never Used  Substance and Sexual Activity   Alcohol use: Not Currently   Drug use: No   Sexual activity: Not on file

## 2024-09-03 ENCOUNTER — Ambulatory Visit: Payer: MEDICAID | Admitting: Orthopedic Surgery

## 2024-09-05 ENCOUNTER — Emergency Department (HOSPITAL_COMMUNITY): Payer: MEDICAID

## 2024-09-05 ENCOUNTER — Other Ambulatory Visit: Payer: Self-pay

## 2024-09-05 ENCOUNTER — Encounter (HOSPITAL_COMMUNITY): Payer: Self-pay

## 2024-09-05 ENCOUNTER — Emergency Department (HOSPITAL_COMMUNITY)
Admission: EM | Admit: 2024-09-05 | Discharge: 2024-09-05 | Disposition: A | Discharge status: Home / Self Care | Payer: MEDICAID | Attending: Emergency Medicine | Admitting: Emergency Medicine

## 2024-09-05 DIAGNOSIS — I10 Essential (primary) hypertension: Secondary | ICD-10-CM | POA: Diagnosis not present

## 2024-09-05 DIAGNOSIS — Y92410 Unspecified street and highway as the place of occurrence of the external cause: Secondary | ICD-10-CM | POA: Insufficient documentation

## 2024-09-05 DIAGNOSIS — Z79899 Other long term (current) drug therapy: Secondary | ICD-10-CM | POA: Diagnosis not present

## 2024-09-05 DIAGNOSIS — S82252A Displaced comminuted fracture of shaft of left tibia, initial encounter for closed fracture: Secondary | ICD-10-CM | POA: Insufficient documentation

## 2024-09-05 DIAGNOSIS — S82832A Other fracture of upper and lower end of left fibula, initial encounter for closed fracture: Secondary | ICD-10-CM | POA: Diagnosis not present

## 2024-09-05 DIAGNOSIS — Z85820 Personal history of malignant melanoma of skin: Secondary | ICD-10-CM | POA: Diagnosis not present

## 2024-09-05 DIAGNOSIS — S99912A Unspecified injury of left ankle, initial encounter: Secondary | ICD-10-CM | POA: Diagnosis present

## 2024-09-05 LAB — CBC WITH DIFFERENTIAL/PLATELET
Abs Immature Granulocytes: 0.01 K/uL (ref 0.00–0.07)
Basophils Absolute: 0.1 K/uL (ref 0.0–0.1)
Basophils Relative: 1 %
Eosinophils Absolute: 0 K/uL (ref 0.0–0.5)
Eosinophils Relative: 1 %
HCT: 46.3 % (ref 39.0–52.0)
Hemoglobin: 14.7 g/dL (ref 13.0–17.0)
Immature Granulocytes: 0 %
Lymphocytes Relative: 18 %
Lymphs Abs: 1.2 K/uL (ref 0.7–4.0)
MCH: 27.3 pg (ref 26.0–34.0)
MCHC: 31.7 g/dL (ref 30.0–36.0)
MCV: 85.9 fL (ref 80.0–100.0)
Monocytes Absolute: 0.7 K/uL (ref 0.1–1.0)
Monocytes Relative: 11 %
Neutro Abs: 4.5 K/uL (ref 1.7–7.7)
Neutrophils Relative %: 69 %
Platelets: 253 K/uL (ref 150–400)
RBC: 5.39 MIL/uL (ref 4.22–5.81)
RDW: 13.8 % (ref 11.5–15.5)
WBC: 6.5 K/uL (ref 4.0–10.5)
nRBC: 0 % (ref 0.0–0.2)

## 2024-09-05 LAB — BASIC METABOLIC PANEL WITH GFR
Anion gap: 10 (ref 5–15)
BUN: 26 mg/dL — ABNORMAL HIGH (ref 6–20)
CO2: 19 mmol/L — ABNORMAL LOW (ref 22–32)
Calcium: 8.4 mg/dL — ABNORMAL LOW (ref 8.9–10.3)
Chloride: 109 mmol/L (ref 98–111)
Creatinine, Ser: 1.09 mg/dL (ref 0.61–1.24)
GFR, Estimated: >60 mL/min (ref >60)
Glucose, Bld: 82 mg/dL (ref 70–99)
Potassium: 5.3 mmol/L — ABNORMAL HIGH (ref 3.5–5.1)
Sodium: 138 mmol/L (ref 135–145)

## 2024-09-05 LAB — RAPID URINE DRUG SCREEN, HOSP PERFORMED
Amphetamines: NOT DETECTED
Barbiturates: NOT DETECTED
Benzodiazepines: POSITIVE — AB
Cocaine: NOT DETECTED
Opiates: POSITIVE — AB
Tetrahydrocannabinol: NOT DETECTED

## 2024-09-05 LAB — ETHANOL: Alcohol, Ethyl (B): <15 mg/dL (ref <15)

## 2024-09-05 MED ORDER — HYDROMORPHONE HCL 1 MG/ML IJ SOLN
2.0000 mg | Freq: Once | INTRAMUSCULAR | Status: AC
  Administered 2024-09-05: 2 mg via INTRAMUSCULAR
  Filled 2024-09-05: qty 2

## 2024-09-05 MED ORDER — OXYCODONE-ACETAMINOPHEN 5-325 MG PO TABS: 1.0000 | ORAL_TABLET | Freq: Four times a day (QID) | ORAL | 0 refills | Status: DC | PRN

## 2024-09-05 NOTE — ED Notes (Signed)
 Pt given urinal, still can't void at this time.  KM

## 2024-09-05 NOTE — ED Notes (Signed)
 Attempted for PIV, unsuccessful. Able to collect blood samples.
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
## 2024-09-05 NOTE — Discharge Instructions (Addendum)
 You were seen today after a fall.  You do have an injury to the left tibia (shin bone).  You will likely need surgery to fix this.  Call Dr. Crist office on Monday morning at 8 AM to be seen in the office on Monday or Tuesday.  He will likely fix your leg on Wednesday.  I prescribed you Percocet pain medicine to the 24 hour [facility] pharmacy at the address listed.  Use this as sparingly as possible.  You can continue taking advil  (Ibuprofen ) at home for pain as well.   Do not put ANY weight on your broken leg. You should use crutches whenever walking.  Try to keep your broken leg elevated at home on a couch or a small pillow in bed, to help with the swelling.
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
## 2024-09-05 NOTE — ED Notes (Signed)
 Pt unable to void at this time.  KM
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
## 2024-09-05 NOTE — Progress Notes (Signed)
 Orthopedic Tech Progress Note Patient Details:  Evan [name] 1971-06-19 [id number]  I received assistance with the posterior long leg with stirrup from RN and MD. The patient did have a heavy, long lower extremity that required two posterior and two stirrups to compensate the length of the leg. The bottom of the heel is very bulky and molded the splint to the best of our ability.  Ortho Devices Type of Ortho Device: Ace wrap, Cotton web roll, Post (long) splint, Stirrup splint Splint Material: Fiberglass Ortho Device/Splint Location: Evan TIBIA Ortho Device/Splint Interventions: Ordered, Application, Adjustment   Post Interventions Patient Tolerated: Fair Instructions Provided: Care of device   Evan [name] Evan [name] 09/05/2024, 7:07 AM

## 2024-09-05 NOTE — Progress Notes (Signed)
 Orthopedic Tech Progress Note Patient Details:  Evan [name] December 11, 1971 [id number] Dropped off crutches per order.  Ortho Devices Type of Ortho Device: Crutches Splint Material: Fiberglass Ortho Device/Splint Location: BLE Ortho Device/Splint Interventions: Ordered, Adjustment   Post Interventions Patient Tolerated: Fair Instructions Provided: Adjustment of device, Care of device, Poper ambulation with device  Morna Pink 09/05/2024, 7:49 AM

## 2024-09-05 NOTE — ED Notes (Signed)
 Phleb stuck twice, got no blood

## 2024-09-07 ENCOUNTER — Ambulatory Visit: Payer: MEDICAID | Admitting: Orthopedic Surgery

## 2024-09-08 ENCOUNTER — Encounter: Payer: Self-pay | Admitting: Orthopedic Surgery

## 2024-09-08 ENCOUNTER — Ambulatory Visit (INDEPENDENT_AMBULATORY_CARE_PROVIDER_SITE_OTHER): Payer: MEDICAID | Admitting: Orthopedic Surgery

## 2024-09-08 DIAGNOSIS — S82202A Unspecified fracture of shaft of left tibia, initial encounter for closed fracture: Secondary | ICD-10-CM

## 2024-09-08 DIAGNOSIS — L97211 Non-pressure chronic ulcer of right calf limited to breakdown of skin: Secondary | ICD-10-CM | POA: Diagnosis not present

## 2024-09-08 DIAGNOSIS — S82402A Unspecified fracture of shaft of left fibula, initial encounter for closed fracture: Secondary | ICD-10-CM

## 2024-09-08 DIAGNOSIS — S82102A Unspecified fracture of upper end of left tibia, initial encounter for closed fracture: Secondary | ICD-10-CM | POA: Diagnosis not present

## 2024-09-08 DIAGNOSIS — I83012 Varicose veins of right lower extremity with ulcer of calf: Secondary | ICD-10-CM

## 2024-09-08 DIAGNOSIS — S82109A Unspecified fracture of upper end of unspecified tibia, initial encounter for closed fracture: Secondary | ICD-10-CM

## 2024-09-08 MED ORDER — OXYCODONE HCL 5 MG PO CAPS: 5.0000 mg | ORAL_CAPSULE | ORAL | 0 refills | Status: DC | PRN
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
## 2024-09-09 ENCOUNTER — Telehealth: Payer: Self-pay | Admitting: Orthopedic Surgery

## 2024-09-09 NOTE — Telephone Encounter (Signed)
 Patient called. Says the lawyer did not get the forms faxed by [place]. Would need it faxed. He would like a call. [phone]

## 2024-09-09 NOTE — Telephone Encounter (Signed)
 Refaxed again today, It went through successfully again. I will let pt know.

## 2024-09-10 ENCOUNTER — Other Ambulatory Visit: Payer: Self-pay

## 2024-09-10 ENCOUNTER — Encounter (HOSPITAL_COMMUNITY): Payer: Self-pay | Admitting: Orthopedic Surgery

## 2024-09-10 NOTE — Anesthesia Preprocedure Evaluation (Signed)
 Anesthesia Evaluation  Patient identified by MRN, date of birth, ID band Patient awake    Reviewed: Allergy & Precautions, NPO status , Patient's Chart, lab work & pertinent test results  History of Anesthesia Complications Negative for: history of anesthetic complications  Airway Mallampati: III  TM Distance: >3 FB Neck ROM: Full   Comment: Previous grade I view with [employer]  (+) Dental Advisory Given   Pulmonary neg shortness of breath, neg sleep apnea, neg COPD, neg recent URI, former smoker   Pulmonary exam normal breath sounds clear to auscultation       Cardiovascular hypertension, (-) angina (-) Past MI, (-) Cardiac Stents and (-) CABG (-) dysrhythmias  Rhythm:Regular Rate:Normal  TTE 03/25/2022: IMPRESSIONS    1. Extremely limited; LV function appears to be normal; focal wall motion  abnormality cannot be excluded; dilated aortic root (44 mm); suggest CTA  or MRA to further assess.   2. Left ventricular ejection fraction, by estimation, is 60 to 65%. The  left ventricle has normal function. The left ventricle has no regional  wall motion abnormalities. Left ventricular diastolic parameters were  normal.   3. Right ventricular systolic function is normal. The right ventricular  size is normal.   4. The mitral valve is normal in structure. No evidence of mitral valve  regurgitation. No evidence of mitral stenosis.   5. The aortic valve is tricuspid. Aortic valve regurgitation is not  visualized. No aortic stenosis is present.   6. Aortic dilatation noted. There is mild dilatation of the aortic root,  measuring 44 mm.   7. The inferior vena cava is normal in size with greater than 50%  respiratory variability, suggesting right atrial pressure of 3 mmHg.     Neuro/Psych neg Seizures PSYCHIATRIC DISORDERS (insomnia, ADD, panic disorder) Anxiety Depression    Chronic pain syndrome  Neuromuscular disease  (neuropathy)    GI/Hepatic negative GI ROS,,,(+)     substance abuse  alcohol use and IV drug use, Hepatitis - (s/p treatment), C  Endo/Other  negative endocrine ROS    Renal/GU negative Renal ROS     Musculoskeletal  (+) Arthritis ,    Abdominal  (+) + obese  Peds  Hematology negative hematology ROS (+) Lab Results      Component                Value               Date                      WBC                      6.5                 09/05/2024                HGB                      14.7                09/05/2024                HCT                      46.3                09/05/2024  MCV                      85.9                09/05/2024                PLT                      253                 09/05/2024              Anesthesia Other Findings H/o metastatic melanoma  Reproductive/Obstetrics                              Anesthesia Physical Anesthesia Plan  ASA: 2  Anesthesia Plan: General   Post-op Pain Management: Dilaudid  IV and Precedex    Induction: Intravenous  PONV Risk Score and Plan: 2 and Ondansetron , Dexamethasone  and Treatment may vary due to age or medical condition  Airway Management Planned: Oral ETT  Additional Equipment:   Intra-op Plan:   Post-operative Plan: Extubation in OR  Informed Consent: I have reviewed the patients History and Physical, chart, labs and discussed the procedure including the risks, benefits and alternatives for the proposed anesthesia with the patient or authorized representative who has indicated his/her understanding and acceptance.     Dental advisory given  Plan Discussed with: CRNA and Anesthesiologist  Anesthesia Plan Comments: (Risks of general anesthesia discussed including, but not limited to, sore throat, hoarse voice, chipped/damaged teeth, injury to vocal cords, nausea and vomiting, allergic reactions, lung infection, heart attack, stroke, and death. All  questions answered. )         Anesthesia Quick Evaluation

## 2024-09-10 NOTE — Progress Notes (Signed)
 SDW CALL  Patient was given pre-op instructions over the phone. The opportunity was given for the patient to ask questions. No further questions asked. Patient verbalized understanding of instructions given.   PCP - Elnor Lauraine BRAVO, NP Cardiologist - denies  PPM/ICD - denies Device Orders -  Rep Notified -   Chest x-ray - na EKG - DOS Stress Test - denies ECHO - 03/26/22 Cardiac Cath - denies  Sleep Study - denies CPAP - no  Fasting Blood Sugar - na Checks Blood Sugar _____ times a day  Blood Thinner Instructions:na Aspirin  Instructions:na  ERAS Protcol -clear liquids until 0640 PRE-SURGERY Ensure or G2- no  COVID TEST- na   Anesthesia review:   Patient denies shortness of breath, fever, cough and chest pain over the phone call   Special instructions:    Oral Hygiene is also important to reduce your risk of infection.  Remember - BRUSH YOUR TEETH THE MORNING OF SURGERY WITH YOUR REGULAR TOOTHPASTE
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
## 2024-09-11 ENCOUNTER — Ambulatory Visit (HOSPITAL_COMMUNITY): Payer: MEDICAID

## 2024-09-11 ENCOUNTER — Other Ambulatory Visit: Payer: Self-pay

## 2024-09-11 ENCOUNTER — Encounter (HOSPITAL_COMMUNITY)
Admission: RE | Disposition: A | Discharge status: Home / Self Care | Payer: Self-pay | Source: Home / Self Care | Attending: Orthopedic Surgery

## 2024-09-11 ENCOUNTER — Ambulatory Visit (HOSPITAL_COMMUNITY): Payer: MEDICAID | Admitting: Anesthesiology

## 2024-09-11 ENCOUNTER — Observation Stay (HOSPITAL_COMMUNITY)
Admission: RE | Admit: 2024-09-11 | Discharge: 2024-09-13 | Disposition: A | Discharge status: Home / Self Care | Payer: MEDICAID | Attending: Orthopedic Surgery | Admitting: Orthopedic Surgery

## 2024-09-11 ENCOUNTER — Encounter (HOSPITAL_COMMUNITY): Payer: MEDICAID | Admitting: Anesthesiology

## 2024-09-11 DIAGNOSIS — S82262A Displaced segmental fracture of shaft of left tibia, initial encounter for closed fracture: Secondary | ICD-10-CM | POA: Diagnosis not present

## 2024-09-11 DIAGNOSIS — I10 Essential (primary) hypertension: Secondary | ICD-10-CM | POA: Diagnosis not present

## 2024-09-11 DIAGNOSIS — S82101A Unspecified fracture of upper end of right tibia, initial encounter for closed fracture: Principal | ICD-10-CM

## 2024-09-11 DIAGNOSIS — S82202A Unspecified fracture of shaft of left tibia, initial encounter for closed fracture: Secondary | ICD-10-CM

## 2024-09-11 DIAGNOSIS — Z87891 Personal history of nicotine dependence: Secondary | ICD-10-CM | POA: Insufficient documentation

## 2024-09-11 DIAGNOSIS — Z79899 Other long term (current) drug therapy: Secondary | ICD-10-CM | POA: Insufficient documentation

## 2024-09-11 DIAGNOSIS — Z85820 Personal history of malignant melanoma of skin: Secondary | ICD-10-CM | POA: Diagnosis not present

## 2024-09-11 DIAGNOSIS — M79606 Pain in leg, unspecified: Secondary | ICD-10-CM | POA: Diagnosis present

## 2024-09-11 DIAGNOSIS — S82892A Other fracture of left lower leg, initial encounter for closed fracture: Secondary | ICD-10-CM | POA: Diagnosis present

## 2024-09-11 HISTORY — PX: TIBIA IM NAIL INSERTION: SHX2516

## 2024-09-11 LAB — CREATININE, SERUM
Creatinine, Ser: 0.89 mg/dL (ref 0.61–1.24)
GFR, Estimated: >60 mL/min (ref >60)

## 2024-09-11 LAB — CBC
HCT: 30.5 % — ABNORMAL LOW (ref 39.0–52.0)
Hemoglobin: 9.3 g/dL — ABNORMAL LOW (ref 13.0–17.0)
MCH: 27 pg (ref 26.0–34.0)
MCHC: 30.5 g/dL (ref 30.0–36.0)
MCV: 88.4 fL (ref 80.0–100.0)
Platelets: 298 K/uL (ref 150–400)
RBC: 3.45 MIL/uL — ABNORMAL LOW (ref 4.22–5.81)
RDW: 15.3 % (ref 11.5–15.5)
WBC: 6.5 K/uL (ref 4.0–10.5)
nRBC: 0 % (ref 0.0–0.2)

## 2024-09-11 SURGERY — INSERTION, INTRAMEDULLARY ROD, TIBIA
Anesthesia: General | Site: Leg Lower | Laterality: Left

## 2024-09-11 MED ORDER — GABAPENTIN 100 MG PO CAPS
100.0000 mg | ORAL_CAPSULE | Freq: Two times a day (BID) | ORAL | Status: DC
  Administered 2024-09-11 – 2024-09-13 (×5): 100 mg via ORAL
  Filled 2024-09-11 (×5): qty 1

## 2024-09-11 MED ORDER — HYDROMORPHONE HCL 1 MG/ML IJ SOLN
INTRAMUSCULAR | Status: AC
  Filled 2024-09-11: qty 1

## 2024-09-11 MED ORDER — METHOCARBAMOL 1000 MG/10ML IJ SOLN: 500.0000 mg | Freq: Four times a day (QID) | INTRAMUSCULAR | Status: DC | PRN

## 2024-09-11 MED ORDER — DEXMEDETOMIDINE HCL IN NACL 80 MCG/20ML IV SOLN
INTRAVENOUS | Status: DC | PRN
Start: 2024-09-11 — End: 2024-09-11
  Administered 2024-09-11 (×4): 8 ug via INTRAVENOUS

## 2024-09-11 MED ORDER — CEFAZOLIN SODIUM-DEXTROSE 2-4 GM/100ML-% IV SOLN
2.0000 g | Freq: Four times a day (QID) | INTRAVENOUS | Status: AC
  Administered 2024-09-11 (×2): 2 g via INTRAVENOUS
  Filled 2024-09-11 (×2): qty 100

## 2024-09-11 MED ORDER — SUGAMMADEX SODIUM 200 MG/2ML IV SOLN
INTRAVENOUS | Status: DC | PRN
  Administered 2024-09-11: 200 mg via INTRAVENOUS

## 2024-09-11 MED ORDER — OXYCODONE HCL 5 MG PO TABS
5.0000 mg | ORAL_TABLET | Freq: Once | ORAL | Status: AC | PRN
  Administered 2024-09-11: 5 mg via ORAL

## 2024-09-11 MED ORDER — ORAL CARE MOUTH RINSE: 15.0000 mL | Freq: Once | OROMUCOSAL | Status: AC

## 2024-09-11 MED ORDER — HYDROMORPHONE HCL 1 MG/ML IJ SOLN
0.5000 mg | INTRAMUSCULAR | Status: DC | PRN
  Administered 2024-09-11 – 2024-09-13 (×2): 1 mg via INTRAVENOUS
  Filled 2024-09-11 (×2): qty 1

## 2024-09-11 MED ORDER — ACETAMINOPHEN 10 MG/ML IV SOLN
1000.0000 mg | Freq: Once | INTRAVENOUS | Status: DC | PRN
  Administered 2024-09-11: 1000 mg via INTRAVENOUS

## 2024-09-11 MED ORDER — ROCURONIUM BROMIDE 10 MG/ML (PF) SYRINGE
PREFILLED_SYRINGE | INTRAVENOUS | Status: DC | PRN
  Administered 2024-09-11: 60 mg via INTRAVENOUS

## 2024-09-11 MED ORDER — LACTATED RINGERS IV SOLN: INTRAVENOUS | Status: DC

## 2024-09-11 MED ORDER — ATENOLOL 25 MG PO TABS
25.0000 mg | ORAL_TABLET | Freq: Every day | ORAL | Status: DC
  Administered 2024-09-11 – 2024-09-13 (×3): 25 mg via ORAL
  Filled 2024-09-11 (×3): qty 1

## 2024-09-11 MED ORDER — DOCUSATE SODIUM 100 MG PO CAPS
100.0000 mg | ORAL_CAPSULE | Freq: Two times a day (BID) | ORAL | Status: DC
  Administered 2024-09-11 – 2024-09-12 (×2): 100 mg via ORAL
  Filled 2024-09-11 (×4): qty 1

## 2024-09-11 MED ORDER — LIDOCAINE 2% (20 MG/ML) 5 ML SYRINGE
INTRAMUSCULAR | Status: AC
  Filled 2024-09-11: qty 5

## 2024-09-11 MED ORDER — ACETAMINOPHEN 325 MG PO TABS: 325.0000 mg | ORAL_TABLET | Freq: Four times a day (QID) | ORAL | Status: DC | PRN

## 2024-09-11 MED ORDER — DEXAMETHASONE SODIUM PHOSPHATE 10 MG/ML IJ SOLN
INTRAMUSCULAR | Status: DC | PRN
Start: 2024-09-11 — End: 2024-09-11
  Administered 2024-09-11: 10 mg via INTRAVENOUS

## 2024-09-11 MED ORDER — HYDROMORPHONE HCL 1 MG/ML IJ SOLN
0.2500 mg | INTRAMUSCULAR | Status: DC | PRN
  Administered 2024-09-11 (×4): 0.5 mg via INTRAVENOUS

## 2024-09-11 MED ORDER — MIDAZOLAM HCL 2 MG/2ML IJ SOLN
INTRAMUSCULAR | Status: DC | PRN
Start: 2024-09-11 — End: 2024-09-11
  Administered 2024-09-11: 2 mg via INTRAVENOUS

## 2024-09-11 MED ORDER — KETOROLAC TROMETHAMINE 30 MG/ML IJ SOLN
INTRAMUSCULAR | Status: AC
  Filled 2024-09-11: qty 1

## 2024-09-11 MED ORDER — ACETAMINOPHEN 500 MG PO TABS
1000.0000 mg | ORAL_TABLET | Freq: Four times a day (QID) | ORAL | Status: AC
  Administered 2024-09-11 – 2024-09-12 (×4): 1000 mg via ORAL
  Filled 2024-09-11 (×4): qty 2

## 2024-09-11 MED ORDER — ALPRAZOLAM 0.5 MG PO TABS
1.0000 mg | ORAL_TABLET | Freq: Three times a day (TID) | ORAL | Status: DC
  Administered 2024-09-11 – 2024-09-13 (×6): 1 mg via ORAL
  Filled 2024-09-11 (×6): qty 2

## 2024-09-11 MED ORDER — EPHEDRINE 5 MG/ML INJ
INTRAVENOUS | Status: AC
  Filled 2024-09-11: qty 5

## 2024-09-11 MED ORDER — CEFAZOLIN SODIUM-DEXTROSE 2-4 GM/100ML-% IV SOLN
INTRAVENOUS | Status: AC
  Filled 2024-09-11: qty 100

## 2024-09-11 MED ORDER — VASHE WOUND IRRIGATION OPTIME
TOPICAL | Status: DC | PRN
  Administered 2024-09-11: 34 [oz_av]

## 2024-09-11 MED ORDER — CHLORHEXIDINE GLUCONATE 0.12 % MT SOLN
OROMUCOSAL | Status: AC
  Administered 2024-09-11: 15 mL via OROMUCOSAL
  Filled 2024-09-11: qty 15

## 2024-09-11 MED ORDER — AMISULPRIDE (ANTIEMETIC) 5 MG/2ML IV SOLN: 10.0000 mg | Freq: Once | INTRAVENOUS | Status: DC | PRN

## 2024-09-11 MED ORDER — FLUCONAZOLE 150 MG PO TABS: 150.0000 mg | ORAL_TABLET | ORAL | Status: DC

## 2024-09-11 MED ORDER — OXYCODONE HCL 5 MG PO TABS
10.0000 mg | ORAL_TABLET | ORAL | Status: DC | PRN
  Administered 2024-09-11: 10 mg via ORAL
  Administered 2024-09-12 (×3): 15 mg via ORAL
  Administered 2024-09-12: 10 mg via ORAL
  Administered 2024-09-13 (×3): 15 mg via ORAL
  Filled 2024-09-11 (×7): qty 3

## 2024-09-11 MED ORDER — OXYCODONE HCL 5 MG/5ML PO SOLN: 5.0000 mg | Freq: Once | ORAL | Status: AC | PRN

## 2024-09-11 MED ORDER — ACETAMINOPHEN 10 MG/ML IV SOLN
INTRAVENOUS | Status: AC
  Filled 2024-09-11: qty 100

## 2024-09-11 MED ORDER — SODIUM CHLORIDE 0.9 % IV SOLN: INTRAVENOUS | Status: AC

## 2024-09-11 MED ORDER — MIDAZOLAM HCL 2 MG/2ML IJ SOLN
INTRAMUSCULAR | Status: AC
  Filled 2024-09-11: qty 2

## 2024-09-11 MED ORDER — HYDROMORPHONE HCL 1 MG/ML IJ SOLN
INTRAMUSCULAR | Status: DC | PRN
  Administered 2024-09-11 (×2): .5 mg via INTRAVENOUS

## 2024-09-11 MED ORDER — ROCURONIUM BROMIDE 10 MG/ML (PF) SYRINGE
PREFILLED_SYRINGE | INTRAVENOUS | Status: AC
  Filled 2024-09-11: qty 10

## 2024-09-11 MED ORDER — OXYCODONE HCL 5 MG PO TABS
5.0000 mg | ORAL_TABLET | ORAL | Status: DC | PRN
  Administered 2024-09-12 (×2): 10 mg via ORAL
  Filled 2024-09-11 (×2): qty 2

## 2024-09-11 MED ORDER — DEXMEDETOMIDINE HCL IN NACL 80 MCG/20ML IV SOLN
INTRAVENOUS | Status: AC
  Filled 2024-09-11: qty 20

## 2024-09-11 MED ORDER — KETOROLAC TROMETHAMINE 30 MG/ML IJ SOLN
30.0000 mg | Freq: Once | INTRAMUSCULAR | Status: AC
  Administered 2024-09-11: 30 mg via INTRAVENOUS

## 2024-09-11 MED ORDER — METOCLOPRAMIDE HCL 5 MG PO TABS: 5.0000 mg | ORAL_TABLET | Freq: Three times a day (TID) | ORAL | Status: DC | PRN

## 2024-09-11 MED ORDER — HYDROMORPHONE HCL 1 MG/ML IJ SOLN
INTRAMUSCULAR | Status: AC
  Filled 2024-09-11: qty 0.5

## 2024-09-11 MED ORDER — OXYCODONE HCL 5 MG PO TABS
ORAL_TABLET | ORAL | Status: AC
  Filled 2024-09-11: qty 1

## 2024-09-11 MED ORDER — ENOXAPARIN SODIUM 40 MG/0.4ML IJ SOSY
40.0000 mg | PREFILLED_SYRINGE | INTRAMUSCULAR | Status: DC
Start: 2024-09-12 — End: 2024-09-13
  Administered 2024-09-12 – 2024-09-13 (×2): 40 mg via SUBCUTANEOUS
  Filled 2024-09-11 (×2): qty 0.4

## 2024-09-11 MED ORDER — ONDANSETRON HCL 4 MG/2ML IJ SOLN
INTRAMUSCULAR | Status: DC | PRN
  Administered 2024-09-11: 4 mg via INTRAVENOUS

## 2024-09-11 MED ORDER — FENTANYL CITRATE (PF) 250 MCG/5ML IJ SOLN
INTRAMUSCULAR | Status: DC | PRN
  Administered 2024-09-11: 100 ug via INTRAVENOUS
  Administered 2024-09-11 (×3): 50 ug via INTRAVENOUS

## 2024-09-11 MED ORDER — AMPHETAMINE-DEXTROAMPHETAMINE 10 MG PO TABS
20.0000 mg | ORAL_TABLET | Freq: Three times a day (TID) | ORAL | Status: DC
  Filled 2024-09-11 (×3): qty 2

## 2024-09-11 MED ORDER — PROPOFOL 10 MG/ML IV BOLUS
INTRAVENOUS | Status: DC | PRN
  Administered 2024-09-11: 200 mg via INTRAVENOUS

## 2024-09-11 MED ORDER — ONDANSETRON HCL 4 MG/2ML IJ SOLN: 4.0000 mg | Freq: Four times a day (QID) | INTRAMUSCULAR | Status: DC | PRN

## 2024-09-11 MED ORDER — METOCLOPRAMIDE HCL 5 MG/ML IJ SOLN: 5.0000 mg | Freq: Three times a day (TID) | INTRAMUSCULAR | Status: DC | PRN

## 2024-09-11 MED ORDER — ONDANSETRON HCL 4 MG/2ML IJ SOLN
INTRAMUSCULAR | Status: AC
  Filled 2024-09-11: qty 2

## 2024-09-11 MED ORDER — LOSARTAN POTASSIUM 50 MG PO TABS
50.0000 mg | ORAL_TABLET | Freq: Every day | ORAL | Status: DC
  Administered 2024-09-11 – 2024-09-13 (×3): 50 mg via ORAL
  Filled 2024-09-11 (×3): qty 1

## 2024-09-11 MED ORDER — DEXAMETHASONE SODIUM PHOSPHATE 10 MG/ML IJ SOLN
INTRAMUSCULAR | Status: AC
  Filled 2024-09-11: qty 1

## 2024-09-11 MED ORDER — PROPOFOL 10 MG/ML IV BOLUS
INTRAVENOUS | Status: AC
  Filled 2024-09-11: qty 20

## 2024-09-11 MED ORDER — LIDOCAINE 2% (20 MG/ML) 5 ML SYRINGE
INTRAMUSCULAR | Status: DC | PRN
  Administered 2024-09-11: 100 mg via INTRAVENOUS

## 2024-09-11 MED ORDER — ONDANSETRON HCL 4 MG PO TABS: 4.0000 mg | ORAL_TABLET | Freq: Four times a day (QID) | ORAL | Status: DC | PRN

## 2024-09-11 MED ORDER — CHLORHEXIDINE GLUCONATE 0.12 % MT SOLN: 15.0000 mL | Freq: Once | OROMUCOSAL | Status: AC

## 2024-09-11 MED ORDER — EPHEDRINE SULFATE-NACL 50-0.9 MG/10ML-% IV SOSY
PREFILLED_SYRINGE | INTRAVENOUS | Status: DC | PRN
  Administered 2024-09-11: 7.5 mg via INTRAVENOUS

## 2024-09-11 MED ORDER — METHOCARBAMOL 500 MG PO TABS
500.0000 mg | ORAL_TABLET | Freq: Four times a day (QID) | ORAL | Status: DC | PRN
  Administered 2024-09-12 – 2024-09-13 (×3): 500 mg via ORAL
  Filled 2024-09-11 (×4): qty 1

## 2024-09-11 MED ORDER — CEFAZOLIN SODIUM-DEXTROSE 2-4 GM/100ML-% IV SOLN
2.0000 g | INTRAVENOUS | Status: AC
  Administered 2024-09-11: 2 g via INTRAVENOUS

## 2024-09-11 MED ORDER — FENTANYL CITRATE (PF) 250 MCG/5ML IJ SOLN
INTRAMUSCULAR | Status: AC
  Filled 2024-09-11: qty 5

## 2024-09-11 MED ORDER — DULOXETINE HCL 60 MG PO CPEP
60.0000 mg | ORAL_CAPSULE | Freq: Every day | ORAL | Status: DC
  Administered 2024-09-11 – 2024-09-13 (×3): 60 mg via ORAL
  Filled 2024-09-11 (×3): qty 1

## 2024-09-11 SURGICAL SUPPLY — 50 items
BAG COUNTER SPONGE SURGICOUNT (BAG) ×1 IMPLANT
BIT DRILL CALIBRATED 4.3X320MM (BIT) IMPLANT
BIT DRILL CROWE POINT TWST 4.3 (DRILL) IMPLANT
BLADE SURG 10 STRL SS (BLADE) ×1 IMPLANT
BNDG COHESIVE 4X5 TAN STRL LF (GAUZE/BANDAGES/DRESSINGS) ×1 IMPLANT
BNDG COHESIVE 6X5 TAN NS LF (GAUZE/BANDAGES/DRESSINGS) IMPLANT
BNDG COHESIVE 6X5 TAN ST LF (GAUZE/BANDAGES/DRESSINGS) ×1 IMPLANT
BNDG ELASTIC 6INX 5YD STR LF (GAUZE/BANDAGES/DRESSINGS) ×1 IMPLANT
BNDG GAUZE DERMACEA FLUFF 4 (GAUZE/BANDAGES/DRESSINGS) ×1 IMPLANT
CLEANSER WND VASHE 34 (WOUND CARE) IMPLANT
COVER SURGICAL LIGHT HANDLE (MISCELLANEOUS) ×3 IMPLANT
CUFF TOURN SGL QUICK 42 (TOURNIQUET CUFF) IMPLANT
CUFF TRNQT CYL 34X4.125X (TOURNIQUET CUFF) IMPLANT
DRAPE C-ARM 42X120 X-RAY (DRAPES) IMPLANT
DRAPE C-ARMOR (DRAPES) IMPLANT
DRAPE IMP U-DRAPE 54X76 (DRAPES) ×1 IMPLANT
DRAPE OEC MINIVIEW 54X84 (DRAPES) ×1 IMPLANT
DRAPE U-SHAPE 47X51 STRL (DRAPES) ×1 IMPLANT
DRSG ADAPTIC 3X8 NADH LF (GAUZE/BANDAGES/DRESSINGS) ×1 IMPLANT
ELECTRODE REM PT RTRN 9FT ADLT (ELECTROSURGICAL) ×1 IMPLANT
GAUZE PAD ABD 8X10 STRL (GAUZE/BANDAGES/DRESSINGS) IMPLANT
GAUZE SPONGE 4X4 12PLY STRL (GAUZE/BANDAGES/DRESSINGS) ×1 IMPLANT
GLOVE BIOGEL PI IND STRL 9 (GLOVE) ×1 IMPLANT
GLOVE SURG ORTHO 9.0 STRL STRW (GLOVE) ×1 IMPLANT
GOWN STRL REUS W/ TWL XL LVL3 (GOWN DISPOSABLE) ×3 IMPLANT
GUIDEPIN VERSANAIL DSP 3.2X444 (ORTHOPEDIC DISPOSABLE SUPPLIES) IMPLANT
GUIDEWIRE 2.6X80 BEAD TIP (WIRE) IMPLANT
KIT BASIN OR (CUSTOM PROCEDURE TRAY) ×1 IMPLANT
KIT TURNOVER KIT B (KITS) ×1 IMPLANT
NAIL TIBIAL PHOENIX 10.5X390 (Nail) IMPLANT
NS IRRIG 1000ML POUR BTL (IV SOLUTION) ×1 IMPLANT
PACK ORTHO EXTREMITY (CUSTOM PROCEDURE TRAY) ×1 IMPLANT
PACK UNIVERSAL I (CUSTOM PROCEDURE TRAY) ×1 IMPLANT
PAD ARMBOARD POSITIONER FOAM (MISCELLANEOUS) ×2 IMPLANT
PAD CAST 4YDX4 CTTN HI CHSV (CAST SUPPLIES) ×1 IMPLANT
PENCIL BUTTON HOLSTER BLD 10FT (ELECTRODE) ×1 IMPLANT
SCREW CORT TI DBL LEAD 5X40 (Screw) IMPLANT
SCREW CORT TI DBL LEAD 5X46 (Screw) IMPLANT
SPONGE T-LAP 18X18 ~~LOC~~+RFID (SPONGE) ×1 IMPLANT
STAPLER SKIN PROX 35W (STAPLE) ×1 IMPLANT
STAPLER SKIN PROX WIDE 3.9 (STAPLE) IMPLANT
STOCKINETTE IMPERVIOUS LG (DRAPES) ×1 IMPLANT
SUCTION TUBE FRAZIER 10FR DISP (SUCTIONS) ×1 IMPLANT
SUT VIC AB 0 CT1 27XBRD ANBCTR (SUTURE) ×1 IMPLANT
SUT VIC AB 2-0 CTB1 (SUTURE) ×1 IMPLANT
SYR BULB IRRIG 60ML STRL (SYRINGE) ×1 IMPLANT
TOWEL GREEN STERILE (TOWEL DISPOSABLE) ×1 IMPLANT
TOWEL GREEN STERILE FF (TOWEL DISPOSABLE) ×1 IMPLANT
TUBE CONNECTING 12X1/4 (SUCTIONS) ×1 IMPLANT
WATER STERILE IRR 1000ML POUR (IV SOLUTION) ×1 IMPLANT

## 2024-09-11 NOTE — Interval H&P Note (Signed)
 History and Physical Interval Note:  09/11/2024 9:58 AM  Evan [name]  has presented today for surgery, with the diagnosis of Left Tibia Fracture.  The various methods of treatment have been discussed with the patient and family. After consideration of risks, benefits and other options for treatment, the patient has consented to  Procedure(s) with comments: INSERTION, INTRAMEDULLARY ROD, TIBIA (Left) - INTERNAL FIXATION LEFT TIBIA as a surgical intervention.  The patient's history has been reviewed, patient examined, no change in status, stable for surgery.  I have reviewed the patient's chart and labs.  Questions were answered to the patient's satisfaction.     Wilburta Milbourn V Diogo Anne

## 2024-09-11 NOTE — Op Note (Signed)
 09/11/2024  11:29 AM  PATIENT:  Evan [name]    PRE-OPERATIVE DIAGNOSIS:  Left Tibia shaft fracture closed   POST-OPERATIVE DIAGNOSIS:  Same  PROCEDURE:  INSERTION, INTRAMEDULLARY ROD, TIBIA Locked proximally and distally. C-arm fluoroscopy to verify reduction.  SURGEON:  Jerona LULLA Sage, MD  PHYSICIAN ASSISTANT:None ANESTHESIA:   General  PREOPERATIVE INDICATIONS:  Evan [name] is a  53 y.o. male with a diagnosis of Left Tibia Fracture who failed conservative measures and elected for surgical management.    The risks benefits and alternatives were discussed with the patient preoperatively including but not limited to the risks of infection, bleeding, nerve injury, cardiopulmonary complications, the need for revision surgery, among others, and the patient was willing to proceed.  OPERATIVE IMPLANTS:   Implant Name Type Inv. Item Serial No. Manufacturer Lot No. LRB No. Used Action  NAIL TIBIAL [place] 10.5X390 - [id number] Nail NAIL TIBIAL [place] 10.5X390  [employer](ORTH,TRAU,BIO,SG) [id number] Left 1 Implanted  SCREW CORT TI DBL LEAD 5X46 - [id number] Screw SCREW CORT TI DBL LEAD 5X46  [employer](ORTH,TRAU,BIO,SG) [id number] Left 1 Implanted  SCREW CORT TI DBL LEAD 5X40 - [id number] Screw SCREW CORT TI DBL LEAD 5X40  [employer](ORTH,TRAU,BIO,SG) [id number] Left 1 Implanted    @ENCIMAGES @  OPERATIVE FINDINGS: C-arm fluoroscopy verified reduction in both AP and lateral planes.  OPERATIVE PROCEDURE: Patient was brought the operating room and underwent a general anesthetic.  After adequate levels anesthesia were obtained patient's left lower extremity was prepped using DuraPrep draped into a sterile field a timeout was called a proximal incision was made just medial to the patella extending proximally for 5 cm.  A guidewire was inserted to the insertion location on the tibial plateau.  C-arm fluoroscopy verified alignment in both AP and lateral planes.  This was over drilled  with the reamer.  A guidewire was then inserted and had a slight bend distally.  With traction the guidewire was inserted across the fracture site.  C-arm Sh[place]ree verified reduction across the fracture site.  This was then sequentially reamed from 8.5 mm to 12 mm for a 10.5 mm nail.  The nail was inserted across the fracture site with traction on the leg.  See en face be verified position proximally and distally.  A proximal locking screw was placed and secured proximally with the locking device.  Distally freehand technique was used to place a screw medial to lateral through the distal interlocking screw.  C-arm fluoroscopy verified reduction.  The incisions were cleansed with Vashe and closed with staples sterile dressing was applied patient was extubated taken the PACU in stable condition.   DISCHARGE PLANNING:  Antibiotic duration: 24 hours  Weightbearing: Touchdown weightbearing on the left with a fracture boot  Pain medication: Opioid pathway  Dressing care/ Wound VAC: Dry dressing  Ambulatory devices: Crutches or walker  Discharge to: Anticipate discharge to skilled nursing.  Follow-up: In the office 1 week post operative.

## 2024-09-11 NOTE — Transfer of Care (Signed)
 Immediate Anesthesia Transfer of Care Note  Patient: Evan [name]  Procedure(s) Performed: INSERTION, INTRAMEDULLARY ROD, TIBIA (Left: Leg Lower)  Patient Location: PACU  Anesthesia Type:General  Level of Consciousness: drowsy and patient cooperative  Airway & Oxygen Therapy: Patient Spontanous Breathing and Patient connected to face mask oxygen  Post-op Assessment: Report given to RN and Post -op Vital signs reviewed and stable  Post vital signs: Reviewed and stable  Last Vitals:  Vitals Value Taken Time  BP 167/86 09/11/24 11:36  Temp    Pulse 68 09/11/24 11:36  Resp 16 09/11/24  11:36  SpO2 96 % 09/11/24 11:36  Vitals shown include unfiled device data.  Last Pain:  Vitals:   09/11/24 0834  TempSrc:   PainSc: 0-No pain         Complications: There were no known notable events for this encounter.

## 2024-09-11 NOTE — Anesthesia Procedure Notes (Signed)
 Procedure Name: Intubation Date/Time: 09/11/2024 10:11 AM  Performed by: Kearney Rosina SAILOR, RNPre-anesthesia Checklist: Patient identified, Emergency Drugs available, Suction available and Patient being monitored Patient Re-evaluated:Patient Re-evaluated prior to induction Oxygen Delivery Method: Circle system utilized Preoxygenation: Pre-oxygenation with 100% oxygen Induction Type: IV induction Ventilation: Mask ventilation without difficulty Laryngoscope Size: Mac and 4 Grade View: Grade I Tube type: Oral Tube size: 7.5 mm Number of attempts: 1 Airway Equipment and Method: Stylet and Oral airway Placement Confirmation: ETT inserted through vocal cords under direct vision, positive ETCO2 and breath sounds checked- equal and bilateral Secured at: 23 cm Tube secured with: Tape Dental Injury: Teeth and Oropharynx as per pre-operative assessment  Comments: Atraumatic

## 2024-09-11 NOTE — Anesthesia Postprocedure Evaluation (Signed)
 Anesthesia Post Note  Patient: Evan [name] An  Procedure(s) Performed: INSERTION, INTRAMEDULLARY ROD, TIBIA (Left: Leg Lower)     Patient location during evaluation: PACU Anesthesia Type: General Level of consciousness: awake Pain management: pain level controlled Vital Signs Assessment: post-procedure vital signs reviewed and stable Respiratory status: spontaneous breathing, nonlabored ventilation and respiratory function stable Cardiovascular status: blood pressure returned to baseline and stable Postop Assessment: no apparent nausea or vomiting Anesthetic complications: no   There were no known notable events for this encounter.  Last Vitals:  Vitals:   09/11/24 1400 09/11/24 1415  BP: (!) 147/98 (!) 149/93  Pulse: 69 68  Resp: 16 17  Temp:  36.6 C  SpO2: 94% 95%    Last Pain:  Vitals:   09/11/24 1257  TempSrc:   PainSc: 8                  [name]

## 2024-09-12 DIAGNOSIS — S82262A Displaced segmental fracture of shaft of left tibia, initial encounter for closed fracture: Secondary | ICD-10-CM | POA: Diagnosis not present

## 2024-09-12 MED ORDER — MELATONIN 3 MG PO TABS
3.0000 mg | ORAL_TABLET | Freq: Every day | ORAL | Status: DC
Start: 2024-09-13 — End: 2024-09-13
  Administered 2024-09-12: 3 mg via ORAL
  Filled 2024-09-12: qty 1

## 2024-09-12 MED ORDER — OXYCODONE-ACETAMINOPHEN 10-325 MG PO TABS: 1.0000 | ORAL_TABLET | ORAL | 0 refills | Status: DC | PRN

## 2024-09-12 NOTE — Discharge Summary (Signed)
 Physician Discharge Summary  Patient ID: Evan [name] MRN: [id number] DOB/AGE: Dec 02, 1971 53 y.o.  Admit date: 09/11/2024 Discharge date: 09/12/2024  Admission Diagnoses:  Principal Problem:   Closed left ankle fracture Active Problems:   Closed displaced segmental fracture of shaft of left tibia   Discharge Diagnoses:  Same  Past Medical History:  Diagnosis Date   ADD (attention deficit disorder) 05/11/2013   ANXIETY 12/10/2007   BACK PAIN 08/03/2009   Chronic pain syndrome 01/28/2014   Depression 06/15/2014   ERECTILE DYSFUNCTION 12/10/2007   Hepatitis C 05/15/2013     treated and cured   HYPERTENSION 11/21/2007   INSOMNIA-SLEEP DISORDER-UNSPEC 12/20/2008   Melanoma (HCC) stg 3C 2015   Lymph nodes left side , radiation   Neuropathy    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 male who was admitted 09/11/2024 with a chief complaint of No chief complaint on file. , and found to have a diagnosis of Closed left ankle fracture.  They were brought to the operating room on 09/11/2024 and underwent the above named procedures.    They were given perioperative antibiotics:  Anti-infectives (From admission, onward)    Start     Dose/Rate Route Frequency Ordered Stop   09/14/24 1000  fluconazole  (DIFLUCAN ) tablet 150 mg        150 mg Oral Weekly 09/11/24 1437     09/11/24 1600  ceFAZolin  (ANCEF ) IVPB 2g/100 mL premix        2 g 200 mL/hr over 30 Minutes Intravenous Every 6 hours 09/11/24 1437 09/11/24 2230   09/11/24 0830  ceFAZolin  (ANCEF ) IVPB 2g/100 mL premix        2 g 200 mL/hr over 30 Minutes Intravenous On call to O.R. 09/11/24 0747 09/11/24 1029   09/11/24 0757  ceFAZolin  (ANCEF ) 2-4 GM/100ML-% IVPB       Note to Pharmacy: Gleason,  Ginger E: cabinet override      09/11/24 0757 09/11/24 1024     .  They were given compression stockings, early ambulation, and chemoprophylaxis for DVT prophylaxis.  They benefited maximally from their hospital stay and there were no complications.    Recent vital signs:  Vitals:   09/12/24 0432 09/12/24 0736  BP: (!) 162/96 137/85  Pulse: 68 62  Resp:  18  Temp: 98 F (36.7 C) 97.7 F (36.5 C)  SpO2: 99% 97%    Recent laboratory studies:  Results for orders placed or performed during the hospital encounter of 09/11/24  CBC   Collection Time: 09/11/24  5:31 PM  Result Value Ref Range   WBC 6.5 4.0 - 10.5 K/uL   RBC 3.45 (L) 4.22 - 5.81 MIL/uL   Hemoglobin 9.3 (L) 13.0 - 17.0 g/dL   HCT 69.4 (L) 60.9 - 47.9 %   MCV 88.4 80.0 - 100.0 fL   MCH 27.0 26.0 - 34.0 pg   MCHC 30.5 30.0 - 36.0 g/dL   RDW 84.6 88.4 - 84.4 %   Platelets 298 150 - 400 K/uL   nRBC 0.0 0.0 - 0.2 %  Creatinine, serum   Collection Time: 09/11/24  5:31 PM  Result Value Ref Range   Creatinine, Ser 0.89 0.61 - 1.24 mg/dL   GFR, Estimated >39 >39 mL/min    Discharge Medications:   Allergies as of 09/12/2024  Reactions   Contrast Media [iodinated Contrast Media] Shortness Of Breath, Swelling   Shortness of breath., throat swelling   Fish Allergy Anaphylaxis   No reaction to shellfish        Medication List     STOP taking these medications    oxycodone  5 MG capsule Commonly known as: OXY-IR   oxyCODONE -acetaminophen  5-325 MG tablet Commonly known as: PERCOCET/ROXICET Replaced by: oxyCODONE -acetaminophen  10-325 MG tablet       TAKE these medications    ALPRAZolam  1 MG tablet Commonly known as: XANAX  Take 1 mg by mouth 3 (three) times daily.   amphetamine -dextroamphetamine  20 MG tablet Commonly known as: ADDERALL Take 20 mg by mouth 3 (three) times daily.   atenolol  25 MG tablet Commonly known as: TENORMIN  Take 1 tablet (25 mg total) by mouth daily.   DULoxetine  60 MG  capsule Commonly known as: CYMBALTA  Take 60 mg by mouth daily.   fluconazole  150 MG tablet Commonly known as: DIFLUCAN  Take 1 tablet (150 mg total) by mouth once a week.   gabapentin  100 MG capsule Commonly known as: NEURONTIN  TAKE 1 CAPSULE(100 MG) BY MOUTH TWICE DAILY AS NEEDED FOR PAIN   ibuprofen  200 MG tablet Commonly known as: ADVIL  Take 1,200-1,400 mg by mouth every 4 (four) hours as needed for headache or moderate pain (pain score 4-6).   losartan  50 MG tablet Commonly known as: COZAAR  Take 1 tablet (50 mg total) by mouth daily.   oxyCODONE -acetaminophen  10-325 MG tablet Commonly known as: PERCOCET Take 1 tablet by mouth every 4 (four) hours as needed. Replaces: oxyCODONE -acetaminophen  5-325 MG tablet               Discharge Care Instructions  (From admission, onward)           Start     Ordered   09/12/24 0000  Non weight bearing       Question Answer Comment  Laterality left   Extremity Lower      09/12/24 0921            Diagnostic Studies: DG Tibia/Fibula Left Result Date: 09/11/2024 EXAM: XR Tibia and fibula. 09/11/2024 11:29:00 AM COMPARISON: None available. CLINICAL HISTORY: [id number] Elective surgery [phone]. INSERTION, INTRAMEDULLARY ROD, TIBIA Left FINDINGS: SOFT TISSUES: Single fluoroscopic spot view of the lower leg submitted from the operating room. Intramedullary rod partially included with distal locking screw. FL time : 107.7 seconds mGy: 5.99 IMPRESSION: 1. Procedure fluoroscopy during tibial fracture fixation. Electronically signed by: Andrea Gasman MD 09/11/2024 02:38 PM EDT RP Workstation: [id number]   DG C-Arm 1-60 Min-No Report Result Date: 09/11/2024 Fluoroscopy was utilized by the requesting physician.  No radiographic interpretation.   DG C-Arm 1-60 Min-No Report Result Date: 09/11/2024 Fluoroscopy was utilized by the requesting physician.  No radiographic interpretation.   DG MINI C-ARM IMAGE ONLY Result Date:  09/11/2024 There is no interpretation for this exam.  This order is for images obtained during a surgical procedure.  Please See Surgeries Tab for more information regarding the procedure.   DG Tibia/Fibula Left Result Date: 09/05/2024 CLINICAL DATA:  53 year old male with recent fall and leg deformity. EXAM: LEFT TIBIA AND FIBULA - 2 VIEW COMPARISON:  Left tib fib series 0 330 hours today. FINDINGS: Four views with cast/splint material in place now. Spiral comminuted distal left tibia shaft fracture which continues distally into the metadiaphysis. 1/2 shaft width lateral displacement. Less than 1/2 shaft width posterior displacement now (previously somewhat anterior). Regional soft tissue swelling. Less displaced proximal  left fibula metadiaphysis fracture. Distal fibula appears to remain intact. Mortise joint alignment appears stable, maintained. Knee joint alignment appears stable, maintained. IMPRESSION: 1. Spiral comminuted distal left tibia shaft fracture with 1/2 shaft width lateral displacement and less than 1/2 shaft width posterior displacement now. 2. Stable less displaced proximal left fibula metadiaphysis fracture. Electronically Signed   By: VEAR Hurst M.D.   On: 09/05/2024 07:35   DG Tibia/Fibula Left Result Date: 09/05/2024 CLINICAL DATA:  Recent fall with leg pain and deformity, initial encounter EXAM: LEFT TIBIA AND FIBULA - 2 VIEW COMPARISON:  None Available. FINDINGS: Proximal fibular fracture is noted at the junction of the head and neck. Additionally oblique fracture of the mid to distal tibial shaft is seen. The tibial fracture extends inferiorly into the metaphysis. IMPRESSION: Proximal fibular fracture with mid to distal tibial fracture. Electronically Signed   By: Oneil Devonshire M.D.   On: 09/05/2024 03:51    Disposition: Discharge disposition: 01-Home or Self Care       Discharge Instructions     Call MD / Call 911   Complete by: As directed    If you experience chest pain  or shortness of breath, CALL 911 and be transported to the hospital emergency room.  If you develope a fever above 101 F, pus (white drainage) or increased drainage or redness at the wound, or calf pain, call your surgeon's office.   Constipation Prevention   Complete by: As directed    Drink plenty of fluids.  Prune juice may be helpful.  You may use a stool softener, such as Colace (over the counter) 100 mg twice a day.  Use MiraLax  (over the counter) for constipation as needed.   Diet - low sodium heart healthy   Complete by: As directed    Increase activity slowly as tolerated   Complete by: As directed    Non weight bearing   Complete by: As directed    Laterality: left   Extremity: Lower   Post-operative opioid taper instructions:   Complete by: As directed    POST-OPERATIVE OPIOID TAPER INSTRUCTIONS: It is important to wean off of your opioid medication as soon as possible. If you do not need pain medication after your surgery it is ok to stop day one. Opioids include: Codeine, Hydrocodone (Norco, Vicodin), Oxycodone (Percocet, oxycontin ) and hydromorphone  amongst others.  Long term and even short term use of opiods can cause: Increased pain response Dependence Constipation Depression Respiratory depression And more.  Withdrawal symptoms can include Flu like symptoms Nausea, vomiting And more Techniques to manage these symptoms Hydrate well Eat regular healthy meals Stay active Use relaxation techniques(deep breathing, meditating, yoga) Do Not substitute Alcohol to help with tapering If you have been on opioids for less than two weeks and do not have pain than it is ok to stop all together.  Plan to wean off of opioids This plan should start within one week post op of your joint replacement. Maintain the same interval or time between taking each dose and first decrease the dose.  Cut the total daily intake of opioids by one tablet each day Next start to increase the time  between doses. The last dose that should be eliminated is the evening dose.           Follow-up Information     Harden Jerona GAILS, MD Follow up in 1 week(s).   Specialty: Orthopedic Surgery Contact information: [address] [place]  [place] [address] [phone]  Signed: Jerona LULLA Sage 09/12/2024, 9:21 AM
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
## 2024-09-13 ENCOUNTER — Observation Stay (HOSPITAL_COMMUNITY): Payer: MEDICAID

## 2024-09-13 DIAGNOSIS — S82262A Displaced segmental fracture of shaft of left tibia, initial encounter for closed fracture: Secondary | ICD-10-CM | POA: Diagnosis not present

## 2024-09-13 MED ORDER — METHOCARBAMOL 500 MG PO TABS: 500.0000 mg | ORAL_TABLET | Freq: Three times a day (TID) | ORAL | 1 refills | Status: AC | PRN

## 2024-09-13 MED ORDER — OXYCODONE HCL 5 MG PO TABS: 5.0000 mg | ORAL_TABLET | ORAL | 0 refills | Status: DC | PRN

## 2024-09-13 NOTE — TOC Transition Note (Addendum)
 Transition of Care [facility]) - Discharge Note   Patient Details  Name: Evan [name] MRN: [id number] Date of Birth: 11-30-1971  Transition of Care [facility]) CM/SW Contact:  Robynn Eileen Hoose, RN Phone Number: 09/13/2024, 8:14 AM   Clinical Narrative:   Patient is being discharged. DME and HH recommendations noted.  Spoke with patient, no preference on company for DME or HH.  Patient reports that he has Medicare A&B, but the card is at home.  DME ordered through [place] with [employer] to be delivered to the bedside. Message to Provider for [facility] orders.  Message to [facility] with [employer] for [facility] services, awaiting response.  Mother to transport patient home.    [id number] Huxley with [employer] needing confirmation of Medicare coverage before accepting pt for [facility] services. Pt reports he will send a copy of his card when he gets home.    Final next level of care: Home w Home Health Services Barriers to Discharge: No Barriers Identified   Patient Goals and CMS Choice            Discharge Placement                       Discharge Plan and Services Additional resources added to the After Visit Summary for                  DME Arranged: Lightweight manual wheelchair with seat cushion, Bedside commode, 3-N-1 DME Agency: [employer] Date DME Agency Contacted: 09/13/24 Time DME Agency Contacted: [phone] Representative spoke with at DME Agency: [place]            Social Drivers of Health (SDOH) Interventions SDOH Screenings   Food Insecurity: No Food Insecurity (09/12/2024)  Housing: Low Risk  (09/12/2024)  Transportation Needs: No Transportation Needs (09/12/2024)  Utilities: Not At Risk (09/12/2024)  Depression (PHQ2-9): Medium Risk (10/18/2022)  Tobacco Use: Medium Risk (09/10/2024)     Readmission Risk Interventions     No data to display

## 2024-09-13 NOTE — Plan of Care (Signed)
   Problem: Health Behavior/Discharge Planning: Goal: Ability to manage health-related needs will improve Outcome: Progressing   Problem: Clinical Measurements: Goal: Respiratory complications will improve Outcome: Progressing Goal: Cardiovascular complication will be avoided Outcome: Progressing

## 2024-09-13 NOTE — Progress Notes (Addendum)
 Discharge Nurse Summary: DC order noted per MD. DC RN at bedside with patient. Patient agreeable with discharge plan, states parents will arrive soon for pickup.   AVS printed/reviewed. PIV removed, skin intact. No DME needs. No home/TOC meds. Requested meds sent to preferred Rx be adjusted without tylenol , adding robaxin . PA Magnant informed, meds adjusted with preferred Rx. Updated AVS printed/reviewed. CP/Edu resolved. Telemonitor not present on assessment. All belongings accounted for. Dressing to LLE wound, CDI w/o s/s bleeding or drainage. See LDAs. Wheelchair/BSC delivered to bedside. XR spine results clear w/o deformity. Patient wheeled downstairs for discharge by private auto.   Rosario EMERSON Lund, RN

## 2024-09-13 NOTE — Progress Notes (Signed)
  Subjective: Patient stable.  Pain in the left leg is mild to moderate.  He is taking pain medication.  Reporting more pain in his back.  He did have a direct injury to his back at the time of his index injury to the left leg.  Denies any numbness and tingling.  He is able to get around but his back pain is more significant today.   Objective: Vital signs in last 24 hours: Temp:  [97.7 F (36.5 C)-98.1 F (36.7 C)] 97.7 F (36.5 C) (09/21 0413) Pulse Rate:  [62-76] 73 (09/21 0838) Resp:  [15-18] 18 (09/21 0838) BP: (174-181)/(96-103) 181/101 (09/21 0838) SpO2:  [94 %-100 %] 100 % (09/21 0838)  Intake/Output from previous day: 09/20 0701 - 09/21 0700 In: 480 [P.O.:480] Out: 1100 [Urine:1100] Intake/Output this shift: Total I/O In: -  Out: 800 [Urine:800]  Exam:  Sensation intact distally Dorsiflexion/Plantar flexion intact No cellulitis present Compartment soft Lower back does have some ecchymosis around the L4-5 region.  Extends about 1 handbreadth on either side of that spinous process.  Labs: Recent Labs    09/11/24 1731  HGB 9.3*   Recent Labs    09/11/24 1731  WBC 6.5  RBC 3.45*  HCT 30.5*  PLT 298   Recent Labs    09/11/24 1731  CREATININE 0.89   No results for input(s): LABPT, INR in the last 72 hours.  Assessment/Plan: Impression is doing reasonably well following left tibial shaft intramedullary nail.  Having some bruising around the back region.  Did have a direct impact on this area at the time of his injury.  Plan plain radiographs before discharge.  After those radiographs have been done and read he is okay to go home per discharge summary yesterday done by Dr. Harden.   KANDICE Hamilton Marciel Offenberger 09/13/2024, 9:33 AM
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
## 2024-09-13 NOTE — Plan of Care (Signed)

## 2024-09-13 NOTE — Progress Notes (Signed)
 Patient is currently waiting for wheelchair delivery.
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
## 2024-09-14 ENCOUNTER — Encounter (HOSPITAL_COMMUNITY): Payer: Self-pay | Admitting: Orthopedic Surgery

## 2024-09-17 ENCOUNTER — Ambulatory Visit (INDEPENDENT_AMBULATORY_CARE_PROVIDER_SITE_OTHER): Payer: MEDICAID | Admitting: Physician Assistant

## 2024-09-17 ENCOUNTER — Telehealth: Payer: Self-pay | Admitting: Family

## 2024-09-17 DIAGNOSIS — S82402A Unspecified fracture of shaft of left fibula, initial encounter for closed fracture: Secondary | ICD-10-CM

## 2024-09-17 DIAGNOSIS — S82202A Unspecified fracture of shaft of left tibia, initial encounter for closed fracture: Secondary | ICD-10-CM

## 2024-09-17 DIAGNOSIS — L97211 Non-pressure chronic ulcer of right calf limited to breakdown of skin: Secondary | ICD-10-CM

## 2024-09-17 DIAGNOSIS — I890 Lymphedema, not elsewhere classified: Secondary | ICD-10-CM

## 2024-09-17 DIAGNOSIS — I83012 Varicose veins of right lower extremity with ulcer of calf: Secondary | ICD-10-CM

## 2024-09-17 MED ORDER — OXYCODONE HCL 5 MG PO TABS: 5.0000 mg | ORAL_TABLET | Freq: Four times a day (QID) | ORAL | 0 refills | Status: DC | PRN
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
## 2024-09-17 NOTE — Telephone Encounter (Signed)
 Pt called and stated he had surgery 9/19. Pt asking for his pain medication to be sent before his appt today so he can pick up before his appt today. Please send to [facility]. Pt phone number is [phone].

## 2024-09-18 ENCOUNTER — Encounter: Payer: Self-pay | Admitting: Physician Assistant

## 2024-09-28 ENCOUNTER — Telehealth: Payer: Self-pay | Admitting: Orthopedic Surgery

## 2024-09-28 NOTE — Telephone Encounter (Signed)
 Pt received refill 09/17/2024 #30 for oxycodone  and is asking for refill. Please advise.

## 2024-09-28 NOTE — Telephone Encounter (Signed)
 Patient called and said he needs a refill on Oxycodone . [id number]#[id number]

## 2024-09-29 ENCOUNTER — Other Ambulatory Visit: Payer: Self-pay | Admitting: Physician Assistant

## 2024-09-29 ENCOUNTER — Other Ambulatory Visit: Payer: Self-pay | Admitting: Nurse Practitioner

## 2024-09-29 ENCOUNTER — Telehealth: Payer: Self-pay | Admitting: Orthopedic Surgery

## 2024-09-29 DIAGNOSIS — S82202A Unspecified fracture of shaft of left tibia, initial encounter for closed fracture: Secondary | ICD-10-CM

## 2024-09-29 DIAGNOSIS — I10 Essential (primary) hypertension: Secondary | ICD-10-CM

## 2024-09-29 MED ORDER — OXYCODONE HCL 5 MG PO TABS: 5.0000 mg | ORAL_TABLET | Freq: Four times a day (QID) | ORAL | 0 refills | Status: DC | PRN
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
## 2024-09-29 NOTE — Telephone Encounter (Signed)
 Pt is asking for call with update for meds and pt states still have pains where Harden put metal rode. Please call at [phone]

## 2024-09-29 NOTE — Telephone Encounter (Signed)
 Pt is s/p IM rod left leg and is requesting refill on his pain medication. Please advise.

## 2024-09-29 NOTE — Telephone Encounter (Unsigned)
 Copied from CRM #[id number]. Topic: Clinical - Medication Refill >> Sep 29, 2024 10:35 AM Franky GRADE wrote: Medication: Rx #: [id number] losartan  (COZAAR ) 50 MG tablet [[id number]]   Has the patient contacted their pharmacy? Yes, they asked patient to contact the office. (Agent: If no, request that the patient contact the pharmacy for the refill. If patient does not wish to contact the pharmacy document the reason why and proceed with request.) (Agent: If yes, when and what did the pharmacy advise?)  This is the patient's preferred pharmacy:  [id number] - [place], [place] - [address] AT [facility] OF [address] [place] [address] Phone: [phone] Fax: [phone]  Is this the correct pharmacy for this prescription? Yes If no, delete pharmacy and type the correct one.   Has the prescription been filled recently? No  Is the patient out of the medication? Yes  Has the patient been seen for an appointment in the last year OR does the patient have an upcoming appointment? Yes  Can we respond through MyChart? Yes  Agent: Please be advised that Rx refills may take up to 3 business days. We ask that you follow-up with your pharmacy.

## 2024-09-29 NOTE — Telephone Encounter (Signed)
 Please see additional messages below and advise.

## 2024-09-29 NOTE — Telephone Encounter (Signed)
 Pt states that the insurance would only do a 5 day supply when he got the refill back on 09/17/24

## 2024-09-30 MED ORDER — LOSARTAN POTASSIUM 50 MG PO TABS: 50.0000 mg | ORAL_TABLET | Freq: Every day | ORAL | 0 refills | Status: DC

## 2024-10-05 ENCOUNTER — Encounter: Payer: MEDICAID | Admitting: Orthopedic Surgery

## 2024-10-08 ENCOUNTER — Ambulatory Visit: Payer: MEDICAID | Admitting: Physician Assistant

## 2024-10-08 DIAGNOSIS — I890 Lymphedema, not elsewhere classified: Secondary | ICD-10-CM

## 2024-10-08 DIAGNOSIS — I83012 Varicose veins of right lower extremity with ulcer of calf: Secondary | ICD-10-CM

## 2024-10-08 DIAGNOSIS — S82402A Unspecified fracture of shaft of left fibula, initial encounter for closed fracture: Secondary | ICD-10-CM

## 2024-10-08 DIAGNOSIS — L97211 Non-pressure chronic ulcer of right calf limited to breakdown of skin: Secondary | ICD-10-CM

## 2024-10-08 DIAGNOSIS — S82202A Unspecified fracture of shaft of left tibia, initial encounter for closed fracture: Secondary | ICD-10-CM

## 2024-10-08 MED ORDER — OXYCODONE HCL 5 MG PO TABS
5.0000 mg | ORAL_TABLET | ORAL | 0 refills | Status: DC | PRN
Start: 2024-10-08 — End: 2024-10-16
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
## 2024-10-09 ENCOUNTER — Encounter: Payer: Self-pay | Admitting: Physician Assistant

## 2024-10-16 ENCOUNTER — Telehealth: Payer: Self-pay | Admitting: Orthopedic Surgery

## 2024-10-16 ENCOUNTER — Other Ambulatory Visit: Payer: Self-pay | Admitting: Orthopedic Surgery

## 2024-10-16 DIAGNOSIS — S82202A Unspecified fracture of shaft of left tibia, initial encounter for closed fracture: Secondary | ICD-10-CM

## 2024-10-16 MED ORDER — OXYCODONE HCL 5 MG PO TABS: 5.0000 mg | ORAL_TABLET | Freq: Four times a day (QID) | ORAL | 0 refills | Status: DC | PRN

## 2024-10-16 NOTE — Telephone Encounter (Signed)
 I called pt and the mailbox is full and unable to leave a message.

## 2024-10-16 NOTE — Telephone Encounter (Signed)
 Pt called stating that they need a refill of oxycodone . Pharmacy is [facility] on [place] [address]. Pt would like call back when filled. Pt call back number is [phone]

## 2024-10-16 NOTE — Telephone Encounter (Signed)
 Pt asking for refill of Oxycodone  last refill was 10/08/2024 #30 he is s/p IM rod to tib 09/11/2024 please advise.

## 2024-10-19 ENCOUNTER — Telehealth: Payer: Self-pay | Admitting: Orthopedic Surgery

## 2024-10-19 NOTE — Telephone Encounter (Signed)
 Patient called. Says the pharmacy does not have the RX for the oxycodone .

## 2024-10-20 ENCOUNTER — Other Ambulatory Visit: Payer: Self-pay | Admitting: Orthopedic Surgery

## 2024-10-20 DIAGNOSIS — S82202A Unspecified fracture of shaft of left tibia, initial encounter for closed fracture: Secondary | ICD-10-CM

## 2024-10-20 MED ORDER — OXYCODONE HCL 5 MG PO TABS: 5.0000 mg | ORAL_TABLET | Freq: Four times a day (QID) | ORAL | 0 refills | Status: DC | PRN

## 2024-10-20 NOTE — Telephone Encounter (Signed)
 There was an rx written on 10/16/2024 for Oxycodone  for the pt and he states tha tthis was not received by pharmacy. Data base does not show that this has been filled can you resend?

## 2024-10-22 ENCOUNTER — Encounter: Payer: Self-pay | Admitting: Physician Assistant

## 2024-10-22 ENCOUNTER — Ambulatory Visit (INDEPENDENT_AMBULATORY_CARE_PROVIDER_SITE_OTHER): Payer: MEDICAID | Admitting: Physician Assistant

## 2024-10-22 ENCOUNTER — Other Ambulatory Visit: Payer: Self-pay

## 2024-10-22 DIAGNOSIS — I890 Lymphedema, not elsewhere classified: Secondary | ICD-10-CM

## 2024-10-22 DIAGNOSIS — S82202A Unspecified fracture of shaft of left tibia, initial encounter for closed fracture: Secondary | ICD-10-CM

## 2024-10-22 DIAGNOSIS — S82402A Unspecified fracture of shaft of left fibula, initial encounter for closed fracture: Secondary | ICD-10-CM

## 2024-10-22 DIAGNOSIS — M79605 Pain in left leg: Secondary | ICD-10-CM

## 2024-10-22 DIAGNOSIS — I87331 Chronic venous hypertension (idiopathic) with ulcer and inflammation of right lower extremity: Secondary | ICD-10-CM
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
## 2024-10-26 ENCOUNTER — Encounter: Payer: Self-pay | Admitting: Radiology

## 2024-10-27 ENCOUNTER — Other Ambulatory Visit: Payer: Self-pay | Admitting: Orthopedic Surgery

## 2024-10-27 ENCOUNTER — Telehealth: Payer: Self-pay | Admitting: Physician Assistant

## 2024-10-27 DIAGNOSIS — S82202A Unspecified fracture of shaft of left tibia, initial encounter for closed fracture: Secondary | ICD-10-CM

## 2024-10-27 MED ORDER — OXYCODONE HCL 5 MG PO TABS: 5.0000 mg | ORAL_TABLET | Freq: Two times a day (BID) | ORAL | 0 refills | Status: DC | PRN

## 2024-10-27 NOTE — Telephone Encounter (Signed)
 Pt called stating he need refill of oxycodone . Please send to [facility]. Pt phone number is [phone].

## 2024-10-29 NOTE — Telephone Encounter (Signed)
 Pt informed of info below per Dr. Harden. He says that he has called the pharmacy several time sand they state that they don't have the medication. I let him know that it was sent on 10/27/24 and we havent heard from any sort of issue with pharmacy or insurance on this med refill. I called [facility] for the status of the medication. She asked if we wanted the medicine refilled from when sent on 11/4 and I stated yes, and she told me that she was transferring me to the dispensary department? I was on hold for longer than 20 minutes waiting to speak to a pharmacist. Someone on the other end kept picking up the phone and then placing me back on hold without saying anything. After waiting for 20 minutes, the system disconnected me. I will try again tomorrow morning.

## 2024-10-30 NOTE — Telephone Encounter (Signed)
 Patient picked up medication last night per [facility]

## 2024-11-02 ENCOUNTER — Telehealth: Payer: Self-pay | Admitting: Nurse Practitioner

## 2024-11-02 NOTE — Telephone Encounter (Unsigned)
 Copied from CRM [id number]. Topic: Clinical - Medication Refill >> Nov 02, 2024  5:15 PM Alfonso HERO wrote: Medication: gabapentin  (NEURONTIN ) 100 MG capsule  Has the patient contacted their pharmacy? Yes (Agent: If no, request that the patient contact the pharmacy for the refill. If patient does not wish to contact the pharmacy document the reason why and proceed with request.) (Agent: If yes, when and what did the pharmacy advise?)  This is the patient's preferred pharmacy:   [facility] [place], [place] - [address] [place] [address] Phone: [phone] Fax: [phone] Hours: Not open 24 hours    Is this the correct pharmacy for this prescription? Yes If no, delete pharmacy and type the correct one.   Has the prescription been filled recently? Yes  Is the patient out of the medication? Yes  Has the patient been seen for an appointment in the last year OR does the patient have an upcoming appointment? Yes  Can we respond through MyChart? Yes  Agent: Please be advised that Rx refills may take up to 3 business days. We ask that you follow-up with your pharmacy.

## 2024-11-03 ENCOUNTER — Telehealth: Payer: Self-pay | Admitting: Orthopedic Surgery

## 2024-11-03 NOTE — Telephone Encounter (Signed)
 Pt called stating that he needs transportation for his aapt on 11/13. Call back number is [phone]

## 2024-11-04 ENCOUNTER — Other Ambulatory Visit: Payer: Self-pay | Admitting: Nurse Practitioner

## 2024-11-04 DIAGNOSIS — G629 Polyneuropathy, unspecified: Secondary | ICD-10-CM

## 2024-11-04 MED ORDER — GABAPENTIN 100 MG PO CAPS: 100.0000 mg | ORAL_CAPSULE | Freq: Two times a day (BID) | ORAL | 2 refills | Status: DC | PRN

## 2024-11-05 ENCOUNTER — Encounter: Admitting: Physician Assistant

## 2024-11-05 ENCOUNTER — Encounter: Payer: MEDICAID | Admitting: Physician Assistant

## 2024-11-09 ENCOUNTER — Other Ambulatory Visit: Payer: Self-pay | Admitting: Family

## 2024-11-09 DIAGNOSIS — G629 Polyneuropathy, unspecified: Secondary | ICD-10-CM

## 2024-11-09 NOTE — Telephone Encounter (Unsigned)
 Copied from CRM #[id number]. Topic: Clinical - Medication Refill >> Nov 09, 2024 12:28 PM [name] wrote: Medication: gabapentin  (NEURONTIN ) 100 MG capsule 90 day supply  Has the patient contacted their pharmacy? Yes (Agent: If no, request that the patient contact the pharmacy for the refill. If patient does not wish to contact the pharmacy document the reason why and proceed with request.) (Agent: If yes, when and what did the pharmacy advise?)  This is the patient's preferred pharmacy:  [facility] - [place], [place] - [address] [place] [address] Phone: [phone] Fax: [phone]  Is this the correct pharmacy for this prescription? Yes If no, delete pharmacy and type the correct one.   Has the prescription been filled recently? No  Is the patient out of the medication? No  Has the patient been seen for an appointment in the last year OR does the patient have an upcoming appointment? Yes  Can we respond through MyChart? No  Agent: Please be advised that Rx refills may take up to 3 business days. We ask that you follow-up with your pharmacy.

## 2024-11-12 ENCOUNTER — Other Ambulatory Visit: Payer: Self-pay | Admitting: Family

## 2024-11-12 ENCOUNTER — Telehealth: Payer: Self-pay | Admitting: Physician Assistant

## 2024-11-12 DIAGNOSIS — G629 Polyneuropathy, unspecified: Secondary | ICD-10-CM

## 2024-11-12 NOTE — Telephone Encounter (Signed)
 SW pt, he says that everything is doing okay. He will just wait until his scheduled appointment on 11/17/24 and he has a ride already scheduled for this time and date.

## 2024-11-12 NOTE — Telephone Encounter (Signed)
 Patient called. He would like to know if he could come in today at 3:45?

## 2024-11-17 ENCOUNTER — Telehealth: Payer: Self-pay

## 2024-11-17 ENCOUNTER — Ambulatory Visit (INDEPENDENT_AMBULATORY_CARE_PROVIDER_SITE_OTHER): Payer: MEDICAID | Admitting: Physician Assistant

## 2024-11-17 DIAGNOSIS — S82402A Unspecified fracture of shaft of left fibula, initial encounter for closed fracture: Secondary | ICD-10-CM

## 2024-11-17 DIAGNOSIS — S82202A Unspecified fracture of shaft of left tibia, initial encounter for closed fracture: Secondary | ICD-10-CM

## 2024-11-17 DIAGNOSIS — I890 Lymphedema, not elsewhere classified: Secondary | ICD-10-CM

## 2024-11-17 NOTE — Telephone Encounter (Signed)
 Copied from CRM #[id number]. Topic: Clinical - Medication Refill >> Nov 09, 2024 12:28 PM [name] wrote: Medication: gabapentin  (NEURONTIN ) 100 MG capsule 90 day supply  Has the patient contacted their pharmacy? Yes (Agent: If no, request that the patient contact the pharmacy for the refill. If patient does not wish to contact the pharmacy document the reason why and proceed with request.) (Agent: If yes, when and what did the pharmacy advise?)  This is the patient's preferred pharmacy:  [facility] - [place], [place] - [address] [place] [address] Phone: [phone] Fax: [phone]  Is this the correct pharmacy for this prescription? Yes If no, delete pharmacy and type the correct one.   Has the prescription been filled recently? No  Is the patient out of the medication? No  Has the patient been seen for an appointment in the last year OR does the patient have an upcoming appointment? Yes  Can we respond through MyChart? No  Agent: Please be advised that Rx refills may take up to 3 business days. We ask that you follow-up with your pharmacy. >> Nov 17, 2024  9:29 AM Ole G wrote: [facility] calling checking status: [phone]

## 2024-11-22 ENCOUNTER — Encounter: Payer: Self-pay | Admitting: Physician Assistant
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
## 2024-11-24 ENCOUNTER — Other Ambulatory Visit: Payer: Self-pay | Admitting: Nurse Practitioner

## 2024-11-24 DIAGNOSIS — G629 Polyneuropathy, unspecified: Secondary | ICD-10-CM

## 2024-11-24 NOTE — Telephone Encounter (Signed)
 Copied from CRM #[id number]. Topic: Clinical - Medication Refill >> Nov 24, 2024 10:56 AM Drema MATSU wrote: Medication: gabapentin  (NEURONTIN ) 100 MG capsule  Has the patient contacted their pharmacy? Yes (Agent: If no, request that the patient contact the pharmacy for the refill. If patient does not wish to contact the pharmacy document the reason why and proceed with request.) (Agent: If yes, when and what did the pharmacy advise?)  This is the patient's preferred pharmacy:  [facility] - [place], [place] - [address] [place] [address] Phone: [phone] Fax: [phone]  Is this the correct pharmacy for this prescription? Yes If no, delete pharmacy and type the correct one.   Has the prescription been filled recently? N/A  Is the patient out of the medication? N/a  Has the patient been seen for an appointment in the last year OR does the patient have an upcoming appointment? Yes  Can we respond through MyChart? Yes  Agent: Please be advised that Rx refills may take up to 3 business days. We ask that you follow-up with your pharmacy.

## 2024-11-26 ENCOUNTER — Encounter: Admitting: Physician Assistant

## 2024-12-03 ENCOUNTER — Other Ambulatory Visit: Payer: Self-pay

## 2024-12-03 ENCOUNTER — Ambulatory Visit: Admitting: Physician Assistant

## 2024-12-03 ENCOUNTER — Encounter: Payer: Self-pay | Admitting: Physician Assistant

## 2024-12-03 DIAGNOSIS — I890 Lymphedema, not elsewhere classified: Secondary | ICD-10-CM | POA: Diagnosis not present

## 2024-12-03 DIAGNOSIS — S82402A Unspecified fracture of shaft of left fibula, initial encounter for closed fracture: Secondary | ICD-10-CM | POA: Diagnosis not present

## 2024-12-03 DIAGNOSIS — S82202A Unspecified fracture of shaft of left tibia, initial encounter for closed fracture: Secondary | ICD-10-CM | POA: Diagnosis not present
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
## 2024-12-29 NOTE — H&P (Signed)
 Evan [name] is an 54 y.o. male.   Chief Complaint: left tibial fracture with retained hardware  HPI: He is s/p IM nail of the tibia by Dr. Harden on 09/11/24. He has had increased left knee pain and it was discovered that the left tibial nail has shifted upward due to the tibial fragments settling. Once the fracture has healed we will plan to remove the IM rod.   Past Medical History:  Diagnosis Date   ADD (attention deficit disorder) 05/11/2013   ANXIETY 12/10/2007   BACK PAIN 08/03/2009   Chronic pain syndrome 01/28/2014   Depression 06/15/2014   ERECTILE DYSFUNCTION 12/10/2007   Hepatitis C 05/15/2013     treated and cured   HYPERTENSION 11/21/2007   INSOMNIA-SLEEP DISORDER-UNSPEC 12/20/2008   Melanoma (HCC) stg 3C 2015   Lymph nodes left side , radiation   Neuropathy    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   Preventative health care 05/14/2011    Past Surgical History:  Procedure Laterality Date   EXTERNAL FIXATION LEG Right 01/16/2019   Procedure: EXTERNAL FIXATION RIGHT KNEE;  Surgeon: Kendal Franky SQUIBB, MD;  Location: [facility];  Service: Orthopedics;  Laterality: Right;   HARDWARE REMOVAL Right 04/17/2019   Procedure: HARDWARE REMOVAL RIGHT TIBIA;  Surgeon: Kendal Franky SQUIBB, MD;  Location: [facility];  Service: Orthopedics;  Laterality: Right;   IRRIGATION AND DEBRIDEMENT KNEE Right 04/17/2019   Procedure: IRRIGATION AND DEBRIDEMENT RIGHT KNEE;  Surgeon: Kendal Franky SQUIBB, MD;  Location: [facility];  Service: Orthopedics;  Laterality: Right;   LYMPHADENECTOMY Left    ORIF TIBIA PLATEAU Right 01/20/2019   Procedure: OPEN REDUCTION INTERNAL FIXATION (ORIF) TIBIAL PLATEAU;  Surgeon: Kendal Franky SQUIBB, MD;  Location: [facility];  Service: Orthopedics;  Laterality: Right;   SHOULDER SURGERY Left    chronic L dislocating    TIBIA IM NAIL INSERTION Left 09/11/2024   Procedure: INSERTION, INTRAMEDULLARY ROD, TIBIA;  Surgeon: Harden Jerona GAILS, MD;  Location: [facility];  Service: Orthopedics;   Laterality: Left;  INTERNAL FIXATION LEFT TIBIA    Family History  Problem Relation Age of Onset   Cancer Father        melanoma on back   Cancer Other        pancreatic   Social History:  reports that he quit smoking about 26 years ago. His smoking use included cigarettes. He has never used smokeless tobacco. He reports that he does not currently use alcohol. He reports that he does not use drugs.  Allergies: Allergies[1]  No medications prior to admission.    No results found for this or any previous visit (from the past 48 hours). No results found.  Review of Systems  All other systems reviewed and are negative.   There were no vitals taken for this visit. Physical Exam  Patient is alert, oriented, no adenopathy, well-dressed, normal affect, normal respiratory effort. Scaly dry skin with a well healed later right lag wound.  The left LE has moderate edema.  Well healed scars at the knee and ankle from IM rod.  No cellulitis or weeping.   Imaging: Tibial fracture shows good healing with large lateral bony callus.  There is evidence of bone shortening due to fracture site compression and proximal migration of the IM rod.    Assessment/Plan Visit Diagnoses:  1. Closed fracture of left tibia and fibula, initial encounter       Plan: We will plan for IM rod removal 01/02/24 by DR. Harden. He has  Lymphedema and will continue using his pumps daily with elevation to reduce the edema in his legs.  He will be WBAT until the surgery with a Cam boot.    Maurilio Deland Collet, PA-C 12/29/2024, 1:58 PM       [1]  Allergies Allergen Reactions   Contrast Media [Iodinated Contrast Media] Shortness Of Breath and Swelling    Shortness of breath., throat swelling   Fish Allergy Anaphylaxis    No reaction to shellfish

## 2024-12-30 ENCOUNTER — Encounter (HOSPITAL_COMMUNITY): Payer: Self-pay | Admitting: Orthopedic Surgery

## 2024-12-30 ENCOUNTER — Other Ambulatory Visit: Payer: Self-pay

## 2024-12-30 NOTE — Pre-Procedure Instructions (Signed)
-------------    SDW INSTRUCTIONS given:  Your procedure is scheduled on 1/9.  Report to [name] Main Entrance A at 07:15 A.M., and check in at the Admitting office.  Any questions or running late day of surgery: call [phone]    Remember:  Do not eat after midnight the night before your surgery  You may drink clear liquids until 06:45 AM the morning of your surgery.   Clear liquids allowed are: Water, Non-Citrus Juices (without pulp), Carbonated Beverages, Clear Tea, Black Coffee Only, and Gatorade    Take these medicines the morning of surgery with A SIP OF WATER  ALPRAZolam  (XANAX )  atenolol  (TENORMIN )  DULoxetine  (CYMBALTA )      May take these medicines IF NEEDED: gabapentin  (NEURONTIN )  methocarbamol  (ROBAXIN )  oxyCODONE  (ROXICODONE )    As of today, STOP taking any Aspirin  (unless otherwise instructed by your surgeon) Aleve, Naproxen, Ibuprofen , Motrin , Advil , Goody's, BC's, all herbal medications, fish oil, and all vitamins.   Do NOT Smoke (Tobacco/Vaping) 24 hours prior to your procedure  If you use a CPAP at night, you may bring all equipment for your overnight stay.     You will be asked to remove any contacts, glasses, piercing's, hearing aid's, dentures/partials prior to surgery. Please bring cases for these items if needed.     Patients discharged the day of surgery will not be allowed to drive home, and someone needs to stay with them for 24 hours.  SURGICAL WAITING ROOM VISITATION Patients may have no more than 2 support people in the waiting area - these visitors may rotate.   Pre-op nurse will coordinate an appropriate time for 1 ADULT support person, who may not rotate, to accompany patient in pre-op.  Children under the age of 3 must have an adult with them who is not the patient and must remain in the main waiting area with an adult.  If the patient needs to stay at the hospital during part of their recovery, the visitor guidelines for inpatient  rooms apply.  Please refer to the [facility] website for the visitor guidelines for any additional information.   Special instructions:   [facility]- Preparing For Surgery   Please follow these instructions carefully.   Shower the NIGHT BEFORE SURGERY and the MORNING OF SURGERY with DIAL Soap.   Pat yourself dry with a CLEAN TOWEL.  Wear CLEAN PAJAMAS to bed the night before surgery  Place CLEAN SHEETS on your bed the night of your first shower and DO NOT SLEEP WITH PETS.   Additional instructions for the day of surgery: DO NOT APPLY any lotions, deodorants, cologne, or perfumes.   Do not wear jewelry or makeup Do not wear nail polish, gel polish, artificial nails, or any other type of covering on natural nails (fingers and toes) Do not bring valuables to the hospital. [facility] is not responsible for valuables/personal belongings. Put on clean/comfortable clothes.  Please brush your teeth.  Ask your nurse before applying any prescription medications to the skin.

## 2024-12-30 NOTE — Progress Notes (Signed)
 PCP - Lauraine Pereyra, NP Cardiologist - denies  PPM/ICD - denies   Chest x-ray - 03/26/22 EKG - 09/11/24 Stress Test - denies ECHO - 03/26/22 Cardiac Cath - denies  CPAP - denies  DM- denies  ASA/Blood Thinner Instructions: n/a   ERAS Protcol - clears until 0645  COVID TEST- n/a  Anesthesia review: no  Patient verbally denies any shortness of breath, fever, cough and chest pain during phone call      Questions were answered. Patient verbalized understanding of instructions.

## 2025-01-01 ENCOUNTER — Ambulatory Visit (HOSPITAL_COMMUNITY)

## 2025-01-01 ENCOUNTER — Encounter (HOSPITAL_COMMUNITY)
Admission: RE | Disposition: A | Discharge status: Home / Self Care | Payer: Self-pay | Source: Home / Self Care | Attending: Orthopedic Surgery

## 2025-01-01 ENCOUNTER — Other Ambulatory Visit: Payer: Self-pay

## 2025-01-01 ENCOUNTER — Encounter (HOSPITAL_COMMUNITY): Payer: Self-pay | Admitting: Orthopedic Surgery

## 2025-01-01 ENCOUNTER — Ambulatory Visit (HOSPITAL_COMMUNITY)
Admission: RE | Admit: 2025-01-01 | Discharge: 2025-01-01 | Disposition: A | Discharge status: Home / Self Care | Attending: Orthopedic Surgery | Admitting: Orthopedic Surgery

## 2025-01-01 ENCOUNTER — Other Ambulatory Visit (HOSPITAL_COMMUNITY): Payer: Self-pay

## 2025-01-01 ENCOUNTER — Ambulatory Visit (HOSPITAL_COMMUNITY): Admitting: Anesthesiology

## 2025-01-01 DIAGNOSIS — T84298A Other mechanical complication of internal fixation device of other bones, initial encounter: Secondary | ICD-10-CM

## 2025-01-01 DIAGNOSIS — Z969 Presence of functional implant, unspecified: Secondary | ICD-10-CM | POA: Diagnosis present

## 2025-01-01 DIAGNOSIS — S82202A Unspecified fracture of shaft of left tibia, initial encounter for closed fracture: Secondary | ICD-10-CM

## 2025-01-01 DIAGNOSIS — Z472 Encounter for removal of internal fixation device: Secondary | ICD-10-CM | POA: Diagnosis not present

## 2025-01-01 DIAGNOSIS — I10 Essential (primary) hypertension: Secondary | ICD-10-CM | POA: Insufficient documentation

## 2025-01-01 DIAGNOSIS — Z87891 Personal history of nicotine dependence: Secondary | ICD-10-CM | POA: Insufficient documentation

## 2025-01-01 DIAGNOSIS — S82262A Displaced segmental fracture of shaft of left tibia, initial encounter for closed fracture: Secondary | ICD-10-CM

## 2025-01-01 DIAGNOSIS — Z4589 Encounter for adjustment and management of other implanted devices: Secondary | ICD-10-CM | POA: Diagnosis not present

## 2025-01-01 DIAGNOSIS — B1920 Unspecified viral hepatitis C without hepatic coma: Secondary | ICD-10-CM | POA: Insufficient documentation

## 2025-01-01 HISTORY — PX: HARDWARE REMOVAL: SHX979

## 2025-01-01 LAB — COMPREHENSIVE METABOLIC PANEL WITH GFR
ALT: <5 U/L (ref 0–44)
AST: 17 U/L (ref 15–41)
Albumin: 3.8 g/dL (ref 3.5–5.0)
Alkaline Phosphatase: 133 U/L — ABNORMAL HIGH (ref 38–126)
Anion gap: 12 (ref 5–15)
BUN: 21 mg/dL — ABNORMAL HIGH (ref 6–20)
CO2: 23 mmol/L (ref 22–32)
Calcium: 9.5 mg/dL (ref 8.9–10.3)
Chloride: 105 mmol/L (ref 98–111)
Creatinine, Ser: 1.14 mg/dL (ref 0.61–1.24)
GFR, Estimated: >60 mL/min
Glucose, Bld: 99 mg/dL (ref 70–99)
Potassium: 4.4 mmol/L (ref 3.5–5.1)
Sodium: 141 mmol/L (ref 135–145)
Total Bilirubin: 0.3 mg/dL (ref 0.0–1.2)
Total Protein: 7.7 g/dL (ref 6.5–8.1)

## 2025-01-01 LAB — CBC WITH DIFFERENTIAL/PLATELET
Abs Immature Granulocytes: 0.03 K/uL (ref 0.00–0.07)
Basophils Absolute: 0 K/uL (ref 0.0–0.1)
Basophils Relative: 0 %
Eosinophils Absolute: 0.2 K/uL (ref 0.0–0.5)
Eosinophils Relative: 2 %
HCT: 43.2 % (ref 39.0–52.0)
Hemoglobin: 12.6 g/dL — ABNORMAL LOW (ref 13.0–17.0)
Immature Granulocytes: 0 %
Lymphocytes Relative: 25 %
Lymphs Abs: 2 K/uL (ref 0.7–4.0)
MCH: 25.3 pg — ABNORMAL LOW (ref 26.0–34.0)
MCHC: 29.2 g/dL — ABNORMAL LOW (ref 30.0–36.0)
MCV: 86.7 fL (ref 80.0–100.0)
Monocytes Absolute: 0.6 K/uL (ref 0.1–1.0)
Monocytes Relative: 7 %
Neutro Abs: 5.4 K/uL (ref 1.7–7.7)
Neutrophils Relative %: 66 %
Platelets: 330 K/uL (ref 150–400)
RBC: 4.98 MIL/uL (ref 4.22–5.81)
RDW: 15.8 % — ABNORMAL HIGH (ref 11.5–15.5)
WBC: 8.2 K/uL (ref 4.0–10.5)
nRBC: 0 % (ref 0.0–0.2)

## 2025-01-01 SURGERY — REMOVAL, HARDWARE
Anesthesia: General | Site: Leg Lower | Laterality: Left

## 2025-01-01 MED ORDER — MIDAZOLAM HCL (PF) 2 MG/2ML IJ SOLN
INTRAMUSCULAR | Status: DC | PRN
  Administered 2025-01-01: 2 mg via INTRAVENOUS

## 2025-01-01 MED ORDER — DEXAMETHASONE SOD PHOSPHATE PF 10 MG/ML IJ SOLN
INTRAMUSCULAR | Status: DC | PRN
  Administered 2025-01-01: 10 mg via INTRAVENOUS

## 2025-01-01 MED ORDER — LABETALOL HCL 5 MG/ML IV SOLN
5.0000 mg | Freq: Once | INTRAVENOUS | Status: AC
  Administered 2025-01-01: 5 mg via INTRAVENOUS

## 2025-01-01 MED ORDER — OXYCODONE HCL 5 MG PO TABS
ORAL_TABLET | ORAL | Status: AC
  Filled 2025-01-01: qty 1

## 2025-01-01 MED ORDER — LABETALOL HCL 5 MG/ML IV SOLN
10.0000 mg | Freq: Once | INTRAVENOUS | Status: AC
  Administered 2025-01-01: 10 mg via INTRAVENOUS

## 2025-01-01 MED ORDER — ONDANSETRON HCL 4 MG/2ML IJ SOLN
INTRAMUSCULAR | Status: DC | PRN
  Administered 2025-01-01: 4 mg via INTRAVENOUS

## 2025-01-01 MED ORDER — LACTATED RINGERS IV SOLN: INTRAVENOUS | Status: DC

## 2025-01-01 MED ORDER — CHLORHEXIDINE GLUCONATE 0.12 % MT SOLN
15.0000 mL | Freq: Once | OROMUCOSAL | Status: AC
  Administered 2025-01-01: 15 mL via OROMUCOSAL
  Filled 2025-01-01: qty 15

## 2025-01-01 MED ORDER — OXYCODONE HCL 5 MG/5ML PO SOLN: 5.0000 mg | Freq: Once | ORAL | Status: AC | PRN

## 2025-01-01 MED ORDER — ORAL CARE MOUTH RINSE: 15.0000 mL | Freq: Once | OROMUCOSAL | Status: AC

## 2025-01-01 MED ORDER — PROPOFOL 10 MG/ML IV BOLUS
INTRAVENOUS | Status: DC | PRN
  Administered 2025-01-01: 150 mg via INTRAVENOUS

## 2025-01-01 MED ORDER — PROPOFOL 10 MG/ML IV BOLUS
INTRAVENOUS | Status: AC
  Filled 2025-01-01: qty 20

## 2025-01-01 MED ORDER — FENTANYL CITRATE (PF) 250 MCG/5ML IJ SOLN
INTRAMUSCULAR | Status: AC
  Filled 2025-01-01: qty 5

## 2025-01-01 MED ORDER — LIDOCAINE 2% (20 MG/ML) 5 ML SYRINGE
INTRAMUSCULAR | Status: DC | PRN
  Administered 2025-01-01: 80 mg via INTRAVENOUS

## 2025-01-01 MED ORDER — ACETAMINOPHEN 10 MG/ML IV SOLN
INTRAVENOUS | Status: AC
  Filled 2025-01-01: qty 100

## 2025-01-01 MED ORDER — FENTANYL CITRATE (PF) 100 MCG/2ML IJ SOLN
25.0000 ug | INTRAMUSCULAR | Status: DC | PRN
  Administered 2025-01-01 (×3): 50 ug via INTRAVENOUS

## 2025-01-01 MED ORDER — VASHE WOUND IRRIGATION OPTIME
TOPICAL | Status: DC | PRN
  Administered 2025-01-01: 34 [oz_av]

## 2025-01-01 MED ORDER — OXYCODONE HCL 5 MG PO TABS
5.0000 mg | ORAL_TABLET | ORAL | 0 refills | Status: AC | PRN
  Filled 2025-01-01: qty 20, 4d supply, fill #0

## 2025-01-01 MED ORDER — OXYCODONE HCL 5 MG PO TABS
5.0000 mg | ORAL_TABLET | Freq: Once | ORAL | Status: AC | PRN
  Administered 2025-01-01: 5 mg via ORAL

## 2025-01-01 MED ORDER — ACETAMINOPHEN 10 MG/ML IV SOLN
1000.0000 mg | Freq: Once | INTRAVENOUS | Status: DC | PRN
  Administered 2025-01-01: 1000 mg via INTRAVENOUS

## 2025-01-01 MED ORDER — HYDROMORPHONE HCL 1 MG/ML IJ SOLN
INTRAMUSCULAR | Status: DC | PRN
  Administered 2025-01-01: .5 mg via INTRAVENOUS

## 2025-01-01 MED ORDER — FENTANYL CITRATE (PF) 250 MCG/5ML IJ SOLN
INTRAMUSCULAR | Status: DC | PRN
  Administered 2025-01-01: 50 ug via INTRAVENOUS
  Administered 2025-01-01: 100 ug via INTRAVENOUS
  Administered 2025-01-01 (×2): 50 ug via INTRAVENOUS

## 2025-01-01 MED ORDER — LABETALOL HCL 5 MG/ML IV SOLN
INTRAVENOUS | Status: AC
  Filled 2025-01-01: qty 4

## 2025-01-01 MED ORDER — CEFAZOLIN SODIUM-DEXTROSE 2-4 GM/100ML-% IV SOLN
2.0000 g | INTRAVENOUS | Status: AC
  Administered 2025-01-01: 2 g via INTRAVENOUS
  Filled 2025-01-01: qty 100

## 2025-01-01 MED ORDER — MIDAZOLAM HCL 2 MG/2ML IJ SOLN
INTRAMUSCULAR | Status: AC
  Filled 2025-01-01: qty 2

## 2025-01-01 MED ORDER — FENTANYL CITRATE (PF) 100 MCG/2ML IJ SOLN
INTRAMUSCULAR | Status: AC
  Filled 2025-01-01: qty 2

## 2025-01-01 MED ORDER — HYDROMORPHONE HCL 1 MG/ML IJ SOLN
INTRAMUSCULAR | Status: AC
  Filled 2025-01-01: qty 0.5

## 2025-01-01 MED ORDER — ATENOLOL 25 MG PO TABS
25.0000 mg | ORAL_TABLET | Freq: Once | ORAL | Status: AC
  Administered 2025-01-01: 25 mg via ORAL
  Filled 2025-01-01: qty 1

## 2025-01-01 SURGICAL SUPPLY — 40 items
BAG COUNTER SPONGE SURGICOUNT (BAG) ×1 IMPLANT
BNDG COHESIVE 4X5 TAN STRL LF (GAUZE/BANDAGES/DRESSINGS) IMPLANT
BNDG COHESIVE 6X5 TAN NS LF (GAUZE/BANDAGES/DRESSINGS) IMPLANT
BNDG COMPR ESMARK 6X3 LF (GAUZE/BANDAGES/DRESSINGS) IMPLANT
BNDG GAUZE DERMACEA FLUFF 4 (GAUZE/BANDAGES/DRESSINGS) ×1 IMPLANT
CLEANSER WND VASHE INSTL 34OZ (WOUND CARE) IMPLANT
COVER SURGICAL LIGHT HANDLE (MISCELLANEOUS) ×2 IMPLANT
CUFF TOURN SGL QUICK 42 (TOURNIQUET CUFF) IMPLANT
CUFF TRNQT CYL 34X4.125X (TOURNIQUET CUFF) IMPLANT
DRAPE C-ARM 42X72 X-RAY (DRAPES) IMPLANT
DRAPE C-ARMOR (DRAPES) IMPLANT
DRAPE INCISE IOBAN 66X45 STRL (DRAPES) IMPLANT
DRAPE SURG ORHT 6 SPLT 77X108 (DRAPES) IMPLANT
DRAPE U-SHAPE 47X51 STRL (DRAPES) ×1 IMPLANT
DRSG ADAPTIC 3X8 NADH LF (GAUZE/BANDAGES/DRESSINGS) IMPLANT
DRSG EMULSION OIL 3X3 NADH (GAUZE/BANDAGES/DRESSINGS) ×1 IMPLANT
DURAPREP 26ML APPLICATOR (WOUND CARE) ×1 IMPLANT
ELECT PENCIL ROCKER SW 15FT (MISCELLANEOUS) IMPLANT
ELECTRODE REM PT RTRN 9FT ADLT (ELECTROSURGICAL) ×1 IMPLANT
GAUZE PAD ABD 8X10 STRL (GAUZE/BANDAGES/DRESSINGS) ×1 IMPLANT
GAUZE SPONGE 4X4 12PLY STRL (GAUZE/BANDAGES/DRESSINGS) ×1 IMPLANT
GLOVE BIOGEL PI IND STRL 9 (GLOVE) ×1 IMPLANT
GLOVE BIOGEL PI ORTHO SZ9 (GLOVE) ×1 IMPLANT
GOWN STRL REUS W/ TWL XL LVL3 (GOWN DISPOSABLE) ×3 IMPLANT
KIT BASIN OR (CUSTOM PROCEDURE TRAY) ×1 IMPLANT
KIT TURNOVER KIT B (KITS) ×1 IMPLANT
MANIFOLD NEPTUNE II (INSTRUMENTS) ×1 IMPLANT
PACK ORTHO EXTREMITY (CUSTOM PROCEDURE TRAY) ×1 IMPLANT
PAD ARMBOARD POSITIONER FOAM (MISCELLANEOUS) ×2 IMPLANT
SOLN 0.9% NACL POUR BTL 1000ML (IV SOLUTION) ×1 IMPLANT
SOLN STERILE WATER BTL 1000 ML (IV SOLUTION) ×1 IMPLANT
SPONGE T-LAP 18X18 ~~LOC~~+RFID (SPONGE) IMPLANT
STAPLER SKIN PROX 35W (STAPLE) IMPLANT
STOCKINETTE IMPERVIOUS 9X36 MD (GAUZE/BANDAGES/DRESSINGS) IMPLANT
SUT ETHILON 2 0 PSLX (SUTURE) IMPLANT
SUT VIC AB 0 CT1 27XBRD ANBCTR (SUTURE) IMPLANT
SUT VIC AB 2-0 CT1 TAPERPNT 27 (SUTURE) IMPLANT
TOWEL GREEN STERILE (TOWEL DISPOSABLE) ×1 IMPLANT
TOWEL GREEN STERILE FF (TOWEL DISPOSABLE) ×1 IMPLANT
UNDERPAD 30X36 HEAVY ABSORB (UNDERPADS AND DIAPERS) ×1 IMPLANT

## 2025-01-01 NOTE — Progress Notes (Signed)
 Orthopedic Tech Progress Note Patient Details:  Evan [name] 1971/03/24 [id number]  Ortho Devices Type of Ortho Device: CAM walker Ortho Device/Splint Location: LLE Ortho Device/Splint Interventions: Ordered, Application   Post Interventions Patient Tolerated: Well  Evan [name] A Evan [name] 01/01/2025, 1:33 PM

## 2025-01-01 NOTE — Interval H&P Note (Signed)
 History and Physical Interval Note:  01/01/2025 9:30 AM  Evan [name]  has presented today for surgery, with the diagnosis of Left Tibial Fracture.  The various methods of treatment have been discussed with the patient and family. After consideration of risks, benefits and other options for treatment, the patient has consented to  Procedures with comments: REMOVAL, HARDWARE (Left) - REMOVAL LEFT INTRAMEDULLARY ROD as a surgical intervention.  The patient's history has been reviewed, patient examined, no change in status, stable for surgery.  I have reviewed the patient's chart and labs.  Questions were answered to the patient's satisfaction.     Marshell Rieger V Dawson Albers

## 2025-01-01 NOTE — Discharge Instructions (Signed)
 Weight bearing as tolerates in Cam boot.  Elevate for edema and pain.  Crutches for balance.

## 2025-01-01 NOTE — Op Note (Signed)
 01/01/2025  11:09 AM  PATIENT:  Evan [name]    PRE-OPERATIVE DIAGNOSIS: Prominent left tibial nail  POST-OPERATIVE DIAGNOSIS:  Same  PROCEDURE:  REMOVAL OF LEFT TIBIAL INTRAMEDULLARY ROD C-arm fluoroscopy to identify screws  SURGEON:  Jerona LULLA Sage, MD  PHYSICIAN ASSISTANT:None ANESTHESIA:   General  PREOPERATIVE INDICATIONS:  Dionysios Massman is a  54 y.o. male with a diagnosis of Left Tibial Fracture who failed conservative measures and elected for surgical management.    The risks benefits and alternatives were discussed with the patient preoperatively including but not limited to the risks of infection, bleeding, nerve injury, cardiopulmonary complications, the need for revision surgery, among others, and the patient was willing to proceed.  OPERATIVE IMPLANTS:   * No implants in log *  @ENCIMAGES @  OPERATIVE FINDINGS: C-arm fluoroscopy identified safe removal of all hardware  OPERATIVE PROCEDURE: Patient was brought the operating room and underwent general anesthetic.  After adequate levels anesthesia obtained patient's left lower extremities prepped using DuraPrep draped into a sterile field a timeout was called.  Incisions were made over the proximal and distal interlocking screw as well as proximally over the prominent tibial nail.  The bone had overgrown the proximal and distal screws and a bone elevator was used to identify the screw heads.  The extraction bolt was placed on the proximal tibial nail and the 2 screws were then removed.  The nail was removed without complication.  The wounds were irrigated with normal saline and the incisions closed using approximate staples.  A sterile dressing was applied patient was extubated taken the PACU in stable condition.   DISCHARGE PLANNING:  Antibiotic duration: Preoperative antibiotics  Weightbearing: Weightbearing as tolerated in the cam boot walker  Pain medication: Prescription for Percocet  Dressing care/  Wound VAC: Dry dressing  Ambulatory devices: Crutches  Discharge to: Home.  Follow-up: In the office 1 week post operative.

## 2025-01-01 NOTE — Anesthesia Preprocedure Evaluation (Signed)
 "                                  Anesthesia Evaluation  Patient identified by MRN, date of birth, ID band Patient awake    Reviewed: Allergy & Precautions, NPO status , Patient's Chart, lab work & pertinent test results  History of Anesthesia Complications Negative for: history of anesthetic complications  Airway Mallampati: III  TM Distance: >3 FB Neck ROM: Full    Dental  (+) Teeth Intact, Dental Advisory Given   Pulmonary neg shortness of breath, neg sleep apnea, neg COPD, neg recent URI, former smoker   breath sounds clear to auscultation       Cardiovascular hypertension, Pt. on medications (-) angina (-) Past MI and (-) CHF  Rhythm:Regular     Neuro/Psych neg Seizures PSYCHIATRIC DISORDERS Anxiety        GI/Hepatic negative GI ROS,,,(+) Hepatitis -, C  Endo/Other  negative endocrine ROS    Renal/GU negative Renal ROS     Musculoskeletal Retained orthopedic hardware    Abdominal   Peds  Hematology Lab Results      Component                Value               Date                      WBC                      8.2                 01/01/2025                HGB                      12.6 (L)            01/01/2025                HCT                      43.2                01/01/2025                MCV                      86.7                01/01/2025                PLT                      330                 01/01/2025              Anesthesia Other Findings   Reproductive/Obstetrics                              Anesthesia Physical Anesthesia Plan  ASA: 2  Anesthesia Plan: General   Post-op Pain Management: Ofirmev  IV (intra-op)* and Toradol  IV (intra-op)*   Induction: Intravenous  PONV Risk Score and Plan: 3 and Ondansetron  and Dexamethasone   Airway Management Planned: LMA  Additional Equipment: None  Intra-op Plan:   Post-operative Plan: Extubation in OR  Informed Consent: I have reviewed the patients  History and Physical, chart, labs and discussed the procedure including the risks, benefits and alternatives for the proposed anesthesia with the patient or authorized representative who has indicated his/her understanding and acceptance.     Dental advisory given  Plan Discussed with: CRNA  Anesthesia Plan Comments:         Anesthesia Quick Evaluation  "

## 2025-01-01 NOTE — Anesthesia Procedure Notes (Signed)
 Procedure Name: LMA Insertion Date/Time: 01/01/2025 10:13 AM  Performed by: Lamar Lucie DASEN, CRNAPre-anesthesia Checklist: Patient identified, Emergency Drugs available, Suction available and Patient being monitored Patient Re-evaluated:Patient Re-evaluated prior to induction Oxygen Delivery Method: Circle system utilized Preoxygenation: Pre-oxygenation with 100% oxygen Induction Type: IV induction Ventilation: Mask ventilation without difficulty LMA: LMA flexible inserted LMA Size: 5.0 Tube type: Oral Number of attempts: 1 Placement Confirmation: positive ETCO2, breath sounds checked- equal and bilateral and CO2 detector Tube secured with: Tape Dental Injury: Teeth and Oropharynx as per pre-operative assessment

## 2025-01-01 NOTE — Transfer of Care (Signed)
 Immediate Anesthesia Transfer of Care Note  Patient: Evan [name]  Procedure(s) Performed: REMOVAL OF LEFT TIBIAL INTRAMEDULLARY ROD (Left: Leg Lower)  Patient Location: PACU  Anesthesia Type:General  Level of Consciousness: drowsy and patient cooperative  Airway & Oxygen Therapy: Patient Spontanous Breathing and Patient connected to nasal cannula oxygen  Post-op Assessment: Report given to RN, Post -op Vital signs reviewed and stable, and Patient moving all extremities X 4  Post vital signs: Reviewed and stable  Last Vitals:  Vitals Value Taken Time  BP 164/77 01/01/25 11:15  Temp    Pulse 79 01/01/25 11:20  Resp 22 01/01/25 11:20  SpO2 100 % 01/01/25 11:20  Vitals shown include unfiled device data.  Last Pain:  Vitals:   01/01/25 1120  TempSrc:   PainSc: 8       Patients Stated Pain Goal: 3 (01/01/25 0803)  Complications: No notable events documented.

## 2025-01-02 NOTE — Anesthesia Postprocedure Evaluation (Signed)
"   Anesthesia Post Note  Patient: Evan [name]  Procedure(s) Performed: REMOVAL OF LEFT TIBIAL INTRAMEDULLARY ROD (Left: Leg Lower)     Patient location during evaluation: PACU Anesthesia Type: General Level of consciousness: awake and alert Pain management: pain level controlled Vital Signs Assessment: post-procedure vital signs reviewed and stable Respiratory status: spontaneous breathing, nonlabored ventilation and respiratory function stable Cardiovascular status: blood pressure returned to baseline and stable Postop Assessment: no apparent nausea or vomiting Anesthetic complications: no   No notable events documented.                Lucion Dilger      "

## 2025-01-03 ENCOUNTER — Encounter (HOSPITAL_COMMUNITY): Payer: Self-pay | Admitting: Orthopedic Surgery

## 2025-01-03 ENCOUNTER — Other Ambulatory Visit: Payer: Self-pay | Admitting: Nurse Practitioner

## 2025-01-03 DIAGNOSIS — I10 Essential (primary) hypertension: Secondary | ICD-10-CM

## 2025-01-05 ENCOUNTER — Other Ambulatory Visit: Payer: Self-pay | Admitting: Nurse Practitioner

## 2025-01-05 DIAGNOSIS — I10 Essential (primary) hypertension: Secondary | ICD-10-CM

## 2025-01-05 NOTE — Telephone Encounter (Signed)
 Copied from CRM [id number]. Topic: Clinical - Medication Refill >> Jan 05, 2025  4:22 PM Wess RAMAN wrote: Medication: losartan  (COZAAR ) 50 MG tablet   Has the patient contacted their pharmacy? Yes (Agent: If no, request that the patient contact the pharmacy for the refill. If patient does not wish to contact the pharmacy document the reason why and proceed with request.) (Agent: If yes, when and what did the pharmacy advise?)  This is the patient's preferred pharmacy:   [facility] - [place] [place] - [address] [place] [address] Phone: [phone] Fax: [phone]  Is this the correct pharmacy for this prescription? Yes If no, delete pharmacy and type the correct one.   Has the prescription been filled recently? Yes  Is the patient out of the medication? Unknown phone disconnected from pharmacy  Has the patient been seen for an appointment in the last year OR does the patient have an upcoming appointment? Yes  Can we respond through MyChart? Yes  Agent: Please be advised that Rx refills may take up to 3 business days. We ask that you follow-up with your pharmacy.

## 2025-01-07 ENCOUNTER — Ambulatory Visit: Admitting: Physician Assistant

## 2025-01-07 DIAGNOSIS — S82202A Unspecified fracture of shaft of left tibia, initial encounter for closed fracture: Secondary | ICD-10-CM

## 2025-01-07 DIAGNOSIS — S82402A Unspecified fracture of shaft of left fibula, initial encounter for closed fracture: Secondary | ICD-10-CM

## 2025-01-07 NOTE — Progress Notes (Unsigned)
 "  Office Visit Note   Patient: Evan [name]           Date of Birth: [birth year]           MRN: [id number] Visit Date: 01/07/2025              Requested by: Elnor Lauraine BRAVO, NP [address] [place],  [place] [address] PCP: Elnor Lauraine BRAVO, NP  Chief Complaint  Patient presents with   Left Knee - Routine Post Op      HPI: ***  Assessment & Plan: Visit Diagnoses: No diagnosis found.  Plan: ***  Follow-Up Instructions: No follow-ups on file.   Ortho Exam  Patient is alert, oriented, no adenopathy, well-dressed, normal affect, normal respiratory effort. Staples intake, incision healing well with cellulitis     Imaging: No results found.     Labs: Lab Results  Component Value Date   HGBA1C 5.3 09/20/2023   HGBA1C 5.5 10/18/2022   HGBA1C 5.6 04/17/2019   ESRSEDRATE 72 (H) 05/19/2019   ESRSEDRATE 59 (H) 05/05/2019   ESRSEDRATE 75 (H) 04/17/2019   CRP 65.7 (H) 06/23/2019   CRP 61.5 (H) 06/04/2019   CRP 47.8 (H) 05/26/2019   LABURIC 5.6 03/26/2022   REPTSTATUS 04/01/2022 FINAL 03/26/2022   GRAMSTAIN  04/17/2019    RARE WBC PRESENT,BOTH PMN AND MONONUCLEAR NO ORGANISMS SEEN    CULT  03/26/2022    NO GROWTH 5 DAYS Performed at [facility], [address], [place], [place] [address]      Lab Results  Component Value Date   ALBUMIN  3.8 01/01/2025   ALBUMIN  4.0 09/20/2023   ALBUMIN  4.4 10/18/2022    No results found for: MG Lab Results  Component Value Date   VD25OH 32.0 04/17/2019    No results found for: PREALBUMIN    Latest Ref Rng & Units 01/01/2025    7:31 AM 09/11/2024    5:31 PM 09/05/2024   11:10 AM  CBC EXTENDED  WBC 4.0 - 10.5 K/uL 8.2  6.5  6.5   RBC 4.22 - 5.81 MIL/uL 4.98  3.45  5.39   Hemoglobin 13.0 - 17.0 g/dL 87.3  9.3  85.2   HCT 60.9 - 52.0 % 43.2  30.5  46.3   Platelets 150 - 400 K/uL 330  298  253   NEUT# 1.7 - 7.7 K/uL 5.4   4.5   Lymph# 0.7 - 4.0 K/uL 2.0   1.2      There is no height or weight on  file to calculate BMI.  Orders:  No orders of the defined types were placed in this encounter.  No orders of the defined types were placed in this encounter.    Procedures: No procedures performed  Clinical Data: No additional findings.  ROS:  All other systems negative, except as noted in the HPI. Review of Systems  Objective: Vital Signs: There were no vitals taken for this visit.  Specialty Comments:  No specialty comments available.  PMFS History: Patient Active Problem List   Diagnosis Date Noted   Prominence of intramedullary nail 01/01/2025   Closed displaced segmental fracture of shaft of left tibia 09/11/2024   Closed left ankle fracture 09/11/2024   Class 1 obesity with serious comorbidity and body mass index (BMI) of 33.0 to 33.9 in adult 09/20/2023   Panic disorder 10/18/2022   Class 2 obesity with body mass index (BMI) of 36.0 to 36.9 in adult 10/18/2022   Neuropathy 10/18/2022  Venous stasis ulcer of calf limited to breakdown of skin with varicose veins (HCC)    Cellulitis 03/27/2022   AKI (acute kidney injury) 03/26/2022   Multiple open wounds of lower leg 03/26/2022   Dyspnea on exertion 03/26/2022   Great toe pain, right 03/26/2022   IV drug abuse (HCC)    Septic arthritis of knee, right (HCC) 04/17/2019   Hardware complicating wound infection 04/17/2019   Hepatitis C virus infection cured after antiviral drug therapy 04/17/2019   Acute lateral meniscus tear of right knee 01/23/2019   Displaced fracture of right tibial tuberosity, initial encounter for closed fracture 01/16/2019   Closed bicondylar fracture of right tibial plateau 01/15/2019   Cough 10/31/2016   Alcohol abuse with intoxication, with delirium 10/03/2016   Attention deficit disorder (ADD) in adult 10/03/2016   Depression 06/15/2014   Acute bronchitis 01/28/2014   Chronic pain syndrome 01/28/2014   Hepatitis, chronic persistent (HCC) 05/14/2013   ADD  (attention deficit disorder) 05/11/2013   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   Eczema 12/30/2012   Disorder of joints of multiple sites 10/02/2012   Acute and subacute liver necrosis 04/24/2012   Metastatic melanoma 10/17/2011   Chest pain 10/17/2011   Insomnia 12/20/2008   Anxiety state 12/10/2007   ERECTILE DYSFUNCTION 12/10/2007   Essential hypertension 11/21/2007   Past Medical History:  Diagnosis Date   ADD (attention deficit disorder) 05/11/2013   ANXIETY 12/10/2007   BACK PAIN 08/03/2009   Chronic pain syndrome 01/28/2014   Depression 06/15/2014   ERECTILE DYSFUNCTION 12/10/2007   Hepatitis C 05/15/2013     treated and cured   HYPERTENSION 11/21/2007   INSOMNIA-SLEEP DISORDER-UNSPEC 12/20/2008   Melanoma (HCC) stg 3C 2015   Lymph nodes left side , radiation   Neuropathy    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   Preventative health care 05/14/2011    Family History  Problem Relation Age of Onset   Cancer Father        melanoma on back   Cancer Other        pancreatic    Past Surgical History:  Procedure Laterality Date   EXTERNAL FIXATION LEG Right 01/16/2019   Procedure: EXTERNAL FIXATION RIGHT KNEE;  Surgeon: Kendal Franky SQUIBB, MD;  Location: [facility];  Service: Orthopedics;  Laterality: Right;   HARDWARE REMOVAL Right 04/17/2019   Procedure: HARDWARE REMOVAL RIGHT TIBIA;  Surgeon: Kendal Franky SQUIBB, MD;  Location: [facility];  Service: Orthopedics;  Laterality: Right;   HARDWARE REMOVAL Left 01/01/2025   Procedure: REMOVAL OF LEFT TIBIAL INTRAMEDULLARY ROD;  Surgeon: Harden Jerona GAILS, MD;  Location: [facility];  Service: Orthopedics;  Laterality: Left;  REMOVAL LEFT INTRAMEDULLARY ROD   IRRIGATION AND DEBRIDEMENT KNEE Right 04/17/2019   Procedure: IRRIGATION AND DEBRIDEMENT RIGHT KNEE;  Surgeon: Kendal Franky SQUIBB, MD;  Location: [facility];  Service: Orthopedics;  Laterality: Right;   LYMPHADENECTOMY Left    ORIF TIBIA PLATEAU  Right 01/20/2019   Procedure: OPEN REDUCTION INTERNAL FIXATION (ORIF) TIBIAL PLATEAU;  Surgeon: Kendal Franky SQUIBB, MD;  Location: [facility];  Service: Orthopedics;  Laterality: Right;   SHOULDER SURGERY Left    chronic L dislocating    TIBIA IM NAIL INSERTION Left 09/11/2024   Procedure: INSERTION, INTRAMEDULLARY ROD, TIBIA;  Surgeon: Harden Jerona GAILS, MD;  Location: [facility];  Service: Orthopedics;  Laterality: Left;  INTERNAL FIXATION LEFT TIBIA   Social History   Occupational History   Occupation: works in [employer]  Tobacco Use   Smoking status:  Former    Current packs/day: 0.00    Types: Cigarettes    Quit date: 2000    Years since quitting: 26.0   Smokeless tobacco: Never   Tobacco comments:    in college  Vaping Use   Vaping status: Never Used  Substance and Sexual Activity   Alcohol use: Not Currently   Drug use: No   Sexual activity: Not on file       "

## 2025-01-08 ENCOUNTER — Encounter: Payer: Self-pay | Admitting: Physician Assistant

## 2025-01-13 ENCOUNTER — Telehealth: Payer: Self-pay

## 2025-01-13 DIAGNOSIS — I10 Essential (primary) hypertension: Secondary | ICD-10-CM

## 2025-01-13 NOTE — Telephone Encounter (Signed)
 Copied from CRM [id number]. Topic: Clinical - Medication Refill >> Jan 05, 2025  4:22 PM Wess RAMAN wrote: Medication: losartan  (COZAAR ) 50 MG tablet   Has the patient contacted their pharmacy? Yes (Agent: If no, request that the patient contact the pharmacy for the refill. If patient does not wish to contact the pharmacy document the reason why and proceed with request.) (Agent: If yes, when and what did the pharmacy advise?)  This is the patient's preferred pharmacy:   [facility] - [place], [place] - [address] [place] [address] Phone: [phone] Fax: [phone]  Is this the correct pharmacy for this prescription? Yes If no, delete pharmacy and type the correct one.   Has the prescription been filled recently? Yes  Is the patient out of the medication? Unknown phone disconnected from pharmacy  Has the patient been seen for an appointment in the last year OR does the patient have an upcoming appointment? Yes  Can we respond through MyChart? Yes  Agent: Please be advised that Rx refills may take up to 3 business days. We ask that you follow-up with your pharmacy. >> Jan 13, 2025  9:22 AM Ole G wrote: [facility] calling back for status on refill >> Jan 05, 2025  4:28 PM Hadassah PARAS wrote: Norleen from [facility] called in to input pres med for losartan  (COZAAR ) 50 MG tablet. Advised this has been submitted already

## 2025-01-15 MED ORDER — LOSARTAN POTASSIUM 50 MG PO TABS: 50.0000 mg | ORAL_TABLET | Freq: Every day | ORAL | 0 refills | Status: DC

## 2025-01-15 NOTE — Addendum Note (Signed)
 Addended by: LEAR, Ariba Lehnen P on: 01/15/2025 04:54 PM   Modules accepted: Orders

## 2025-01-20 ENCOUNTER — Telehealth: Payer: Self-pay

## 2025-01-20 DIAGNOSIS — I10 Essential (primary) hypertension: Secondary | ICD-10-CM

## 2025-01-20 MED ORDER — LOSARTAN POTASSIUM 50 MG PO TABS: 50.0000 mg | ORAL_TABLET | Freq: Every day | ORAL | 0 refills | Status: AC

## 2025-01-20 NOTE — Telephone Encounter (Signed)
 Copied from CRM [id number]. Topic: Clinical - Prescription Issue >> Jan 20, 2025 10:30 AM Terri MATSU wrote: Reason for CRM: Sam from [facility] is trying to refill medication losartan  (COZAAR ) 50 MG tablet but patient is all out of refills. Can we get a new prescription written please and sent over to them please.  [facility] - [place], [place] - [address] [place] [address] Phone: [phone] Fax: [phone] Hours: Not open 24 hours

## 2025-01-21 ENCOUNTER — Ambulatory Visit: Admitting: Physician Assistant

## 2025-01-21 ENCOUNTER — Other Ambulatory Visit: Payer: Self-pay

## 2025-01-21 DIAGNOSIS — I890 Lymphedema, not elsewhere classified: Secondary | ICD-10-CM

## 2025-01-21 DIAGNOSIS — S82402A Unspecified fracture of shaft of left fibula, initial encounter for closed fracture: Secondary | ICD-10-CM

## 2025-01-21 DIAGNOSIS — S82202A Unspecified fracture of shaft of left tibia, initial encounter for closed fracture: Secondary | ICD-10-CM

## 2025-01-21 NOTE — Progress Notes (Unsigned)
 "  Office Visit Note   Patient: Evan [name]           Date of Birth: 1971/03/14           MRN: [id number] Visit Date: 01/21/2025              Requested by: Elnor Lauraine BRAVO, NP [address] [place],  [place] [address] PCP: Elnor Lauraine BRAVO, NP  Chief Complaint  Patient presents with   Left Leg - Routine Post Op    01/01/2025 removal tibial IM nail       HPI: 54 y/o male who sustained a left tibial fracture requiring IM nail. The IM nail shifted up during healing and required removal. He is s/p removal of IM nail omn 01/01/25 and here for follow up. He is WBAT in the cam boot with crutches. He states the knee feels better with walking.   Assessment & Plan: Visit Diagnoses:  1. Closed fracture of left tibia and fibula, initial encounter     Plan: ***  Follow-Up Instructions: No follow-ups on file.   Ortho Exam  Patient is alert, oriented, no adenopathy, well-dressed, normal affect, normal respiratory effort. Well healed incisions left LE s/p removal of IM rod s/p tibial fracture.  He is walking with a walker and states his knee pain has improved.      Imaging: No results found. No images are attached to the encounter.  Labs: Lab Results  Component Value Date   HGBA1C 5.3 09/20/2023   HGBA1C 5.5 10/18/2022   HGBA1C 5.6 04/17/2019   ESRSEDRATE 72 (H) 05/19/2019   ESRSEDRATE 59 (H) 05/05/2019   ESRSEDRATE 75 (H) 04/17/2019   CRP 65.7 (H) 06/23/2019   CRP 61.5 (H) 06/04/2019   CRP 47.8 (H) 05/26/2019   LABURIC 5.6 03/26/2022   REPTSTATUS 04/01/2022 FINAL 03/26/2022   GRAMSTAIN  04/17/2019    RARE WBC PRESENT,BOTH PMN AND MONONUCLEAR NO ORGANISMS SEEN    CULT  03/26/2022    NO GROWTH 5 DAYS Performed at [facility], [address] [place], [place] [address]      Lab Results  Component Value Date   ALBUMIN  3.8 01/01/2025   ALBUMIN  4.0 09/20/2023   ALBUMIN  4.4 10/18/2022    No results found for: MG Lab Results  Component Value Date    VD25OH 32.0 04/17/2019    No results found for: PREALBUMIN    Latest Ref Rng & Units 01/01/2025    7:31 AM 09/11/2024    5:31 PM 09/05/2024   11:10 AM  CBC EXTENDED  WBC 4.0 - 10.5 K/uL 8.2  6.5  6.5   RBC 4.22 - 5.81 MIL/uL 4.98  3.45  5.39   Hemoglobin 13.0 - 17.0 g/dL 87.3  9.3  85.2   HCT 60.9 - 52.0 % 43.2  30.5  46.3   Platelets 150 - 400 K/uL 330  298  253   NEUT# 1.7 - 7.7 K/uL 5.4   4.5   Lymph# 0.7 - 4.0 K/uL 2.0   1.2      There is no height or weight on file to calculate BMI.  Orders:  Orders Placed This Encounter  Procedures   XR Tibia/Fibula Left   No orders of the defined types were placed in this encounter.    Procedures: No procedures performed  Clinical Data: No additional findings.  ROS:  All other systems negative, except as noted in the HPI. Review of Systems  Objective: Vital Signs: There were  no vitals taken for this visit.  Specialty Comments:  No specialty comments available.  PMFS History: Patient Active Problem List   Diagnosis Date Noted   Prominence of intramedullary nail 01/01/2025   Closed displaced segmental fracture of shaft of left tibia 09/11/2024   Closed left ankle fracture 09/11/2024   Class 1 obesity with serious comorbidity and body mass index (BMI) of 33.0 to 33.9 in adult 09/20/2023   Panic disorder 10/18/2022   Class 2 obesity with body mass index (BMI) of 36.0 to 36.9 in adult 10/18/2022   Neuropathy 10/18/2022   Venous stasis ulcer of calf limited to breakdown of skin with varicose veins (HCC)    Cellulitis 03/27/2022   AKI (acute kidney injury) 03/26/2022   Multiple open wounds of lower leg 03/26/2022   Dyspnea on exertion 03/26/2022   Great toe pain, right 03/26/2022   IV drug abuse (HCC)    Septic arthritis of knee, right (HCC) 04/17/2019   Hardware complicating wound infection 04/17/2019   Hepatitis C virus infection cured after antiviral drug therapy 04/17/2019   Acute lateral  meniscus tear of right knee 01/23/2019   Displaced fracture of right tibial tuberosity, initial encounter for closed fracture 01/16/2019   Closed bicondylar fracture of right tibial plateau 01/15/2019   Cough 10/31/2016   Alcohol abuse with intoxication, with delirium 10/03/2016   Attention deficit disorder (ADD) in adult 10/03/2016   Depression 06/15/2014   Acute bronchitis 01/28/2014   Chronic pain syndrome 01/28/2014   Hepatitis, chronic persistent (HCC) 05/14/2013   ADD (attention deficit disorder) 05/11/2013   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   Eczema 12/30/2012   Disorder of joints of multiple sites 10/02/2012   Acute and subacute liver necrosis 04/24/2012   Metastatic melanoma 10/17/2011   Chest pain 10/17/2011   Insomnia 12/20/2008   Anxiety state 12/10/2007   ERECTILE DYSFUNCTION 12/10/2007   Essential hypertension 11/21/2007   Past Medical History:  Diagnosis Date   ADD (attention deficit disorder) 05/11/2013   ANXIETY 12/10/2007   BACK PAIN 08/03/2009   Chronic pain syndrome 01/28/2014   Depression 06/15/2014   ERECTILE DYSFUNCTION 12/10/2007   Hepatitis C 05/15/2013     treated and cured   HYPERTENSION 11/21/2007   INSOMNIA-SLEEP DISORDER-UNSPEC 12/20/2008   Melanoma (HCC) stg 3C 2015   Lymph nodes left side , radiation   Neuropathy    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   Preventative health care 05/14/2011    Family History  Problem Relation Age of Onset   Cancer Father        melanoma on back   Cancer Other        pancreatic    Past Surgical History:  Procedure Laterality Date   EXTERNAL FIXATION LEG Right 01/16/2019   Procedure: EXTERNAL FIXATION RIGHT KNEE;  Surgeon: Kendal Franky SQUIBB, MD;  Location: [facility];  Service: Orthopedics;  Laterality: Right;   HARDWARE REMOVAL Right 04/17/2019   Procedure: HARDWARE REMOVAL RIGHT TIBIA;  Surgeon: Kendal Franky SQUIBB, MD;  Location: [facility];   Service: Orthopedics;  Laterality: Right;   HARDWARE REMOVAL Left 01/01/2025   Procedure: REMOVAL OF LEFT TIBIAL INTRAMEDULLARY ROD;  Surgeon: Harden Jerona GAILS, MD;  Location: [facility];  Service: Orthopedics;  Laterality: Left;  REMOVAL LEFT INTRAMEDULLARY ROD   IRRIGATION AND DEBRIDEMENT KNEE Right 04/17/2019   Procedure: IRRIGATION AND DEBRIDEMENT RIGHT KNEE;  Surgeon: Kendal Franky SQUIBB, MD;  Location: [facility];  Service: Orthopedics;  Laterality: Right;   LYMPHADENECTOMY Left  ORIF TIBIA PLATEAU Right 01/20/2019   Procedure: OPEN REDUCTION INTERNAL FIXATION (ORIF) TIBIAL PLATEAU;  Surgeon: Kendal Franky SQUIBB, MD;  Location: [facility];  Service: Orthopedics;  Laterality: Right;   SHOULDER SURGERY Left    chronic L dislocating    TIBIA IM NAIL INSERTION Left 09/11/2024   Procedure: INSERTION, INTRAMEDULLARY ROD, TIBIA;  Surgeon: Harden Jerona GAILS, MD;  Location: [facility];  Service: Orthopedics;  Laterality: Left;  INTERNAL FIXATION LEFT TIBIA   Social History   Occupational History   Occupation: works in [employer]  Tobacco Use   Smoking status: Former    Current packs/day: 0.00    Types: Cigarettes    Quit date: 2000    Years since quitting: 26.0   Smokeless tobacco: Never   Tobacco comments:    in college  Vaping Use   Vaping status: Never Used  Substance and Sexual Activity   Alcohol use: Not Currently   Drug use: No   Sexual activity: Not on file       "

## 2025-01-25 ENCOUNTER — Other Ambulatory Visit: Payer: Self-pay | Admitting: Nurse Practitioner

## 2025-01-25 ENCOUNTER — Encounter: Payer: Self-pay | Admitting: Physician Assistant

## 2025-01-25 DIAGNOSIS — I10 Essential (primary) hypertension: Secondary | ICD-10-CM

## 2025-01-27 ENCOUNTER — Telehealth: Payer: Self-pay

## 2025-01-27 NOTE — Telephone Encounter (Unsigned)
 Copied from CRM #[id number]. Topic: Clinical - Medication Refill >> Jan 27, 2025  5:08 PM Nessti S wrote: Medication: losartan  (COZAAR ) 50 MG tablet  Has the patient contacted their pharmacy? Yes (Agent: If no, request that the patient contact the pharmacy for the refill. If patient does not wish to contact the pharmacy document the reason why and proceed with request.) (Agent: If yes, when and what did the pharmacy advise?)  This is the patient's preferred pharmacy:  [facility] - [place], [place] - [address] [place] [address] Phone: [phone] Fax: [phone]  Is this the correct pharmacy for this prescription? Yes If no, delete pharmacy and type the correct one.   Has the prescription been filled recently? No  Is the patient out of the medication? No  Has the patient been seen for an appointment in the last year OR does the patient have an upcoming appointment? Yes  Can we respond through MyChart? Yes  Agent: Please be advised that Rx refills may take up to 3 business days. We ask that you follow-up with your pharmacy.

## 2025-01-28 NOTE — Telephone Encounter (Signed)
 Medication was send in on 01/20/25

## 2025-01-29 ENCOUNTER — Other Ambulatory Visit: Payer: Self-pay | Admitting: Nurse Practitioner

## 2025-01-29 DIAGNOSIS — G629 Polyneuropathy, unspecified: Secondary | ICD-10-CM
